# Patient Record
Sex: Female | Born: 1955 | Race: White | Hispanic: No | Smoking: Current some day smoker
Health system: Southern US, Community
[De-identification: ages and names within clinical notes are randomized; demographics above are authoritative.]

## PROBLEM LIST (undated history)

## (undated) DIAGNOSIS — I10 Essential (primary) hypertension: Secondary | ICD-10-CM

## (undated) DIAGNOSIS — E079 Disorder of thyroid, unspecified: Secondary | ICD-10-CM

## (undated) DIAGNOSIS — E039 Hypothyroidism, unspecified: Secondary | ICD-10-CM

## (undated) DIAGNOSIS — J449 Chronic obstructive pulmonary disease, unspecified: Secondary | ICD-10-CM

## (undated) DIAGNOSIS — I739 Peripheral vascular disease, unspecified: Secondary | ICD-10-CM

## (undated) DIAGNOSIS — E785 Hyperlipidemia, unspecified: Secondary | ICD-10-CM

## (undated) DIAGNOSIS — I509 Heart failure, unspecified: Secondary | ICD-10-CM

## (undated) DIAGNOSIS — R569 Unspecified convulsions: Secondary | ICD-10-CM

## (undated) HISTORY — DX: Essential (primary) hypertension: I10

## (undated) HISTORY — DX: Heart failure, unspecified: I50.9

## (undated) HISTORY — DX: Peripheral vascular disease, unspecified: I73.9

## (undated) HISTORY — DX: Unspecified convulsions: R56.9

## (undated) HISTORY — PX: COLONOSCOPY: SHX174

## (undated) HISTORY — PX: TONSILLECTOMY: SUR1361

## (undated) HISTORY — DX: Disorder of thyroid, unspecified: E07.9

## (undated) HISTORY — DX: Hyperlipidemia, unspecified: E78.5

---

## 2001-02-10 ENCOUNTER — Inpatient Hospital Stay (HOSPITAL_COMMUNITY): Admission: AD | Admit: 2001-02-10 | Discharge: 2001-02-10 | Payer: Self-pay | Admitting: Obstetrics

## 2001-02-27 ENCOUNTER — Other Ambulatory Visit: Admission: RE | Admit: 2001-02-27 | Discharge: 2001-02-27 | Payer: Self-pay | Admitting: *Deleted

## 2005-08-12 ENCOUNTER — Other Ambulatory Visit: Admission: RE | Admit: 2005-08-12 | Discharge: 2005-08-12 | Payer: Self-pay | Admitting: Obstetrics and Gynecology

## 2010-11-17 ENCOUNTER — Ambulatory Visit (HOSPITAL_BASED_OUTPATIENT_CLINIC_OR_DEPARTMENT_OTHER)
Admission: RE | Admit: 2010-11-17 | Discharge: 2010-11-17 | Disposition: A | Discharge status: Home / Self Care | Source: Ambulatory Visit | Attending: Urology | Admitting: Urology

## 2010-11-17 DIAGNOSIS — R109 Unspecified abdominal pain: Secondary | ICD-10-CM | POA: Insufficient documentation

## 2010-11-17 DIAGNOSIS — N949 Unspecified condition associated with female genital organs and menstrual cycle: Secondary | ICD-10-CM | POA: Insufficient documentation

## 2010-11-17 DIAGNOSIS — Z01812 Encounter for preprocedural laboratory examination: Secondary | ICD-10-CM | POA: Insufficient documentation

## 2010-11-17 DIAGNOSIS — Z01818 Encounter for other preprocedural examination: Secondary | ICD-10-CM | POA: Insufficient documentation

## 2010-11-17 DIAGNOSIS — Z87440 Personal history of urinary (tract) infections: Secondary | ICD-10-CM | POA: Insufficient documentation

## 2010-11-17 DIAGNOSIS — F17200 Nicotine dependence, unspecified, uncomplicated: Secondary | ICD-10-CM | POA: Insufficient documentation

## 2010-11-17 DIAGNOSIS — IMO0002 Reserved for concepts with insufficient information to code with codable children: Secondary | ICD-10-CM | POA: Insufficient documentation

## 2010-11-17 DIAGNOSIS — Z01810 Encounter for preprocedural cardiovascular examination: Secondary | ICD-10-CM | POA: Insufficient documentation

## 2010-12-14 NOTE — Op Note (Signed)
  Christie Moreno, Christie Moreno            ACCOUNT NO.:  [id number]  MEDICAL RECORD NO.:  [id number]          PATIENT TYPE:  LOCATION:                                 FACILITY:  PHYSICIAN:  Martina Sinner, MD      DATE OF BIRTH:  DATE OF PROCEDURE: DATE OF DISCHARGE:                              OPERATIVE REPORT   PREOPERATIVE DIAGNOSIS:  Pelvic pain.  POSTOPERATIVE DIAGNOSIS:  Pelvic pain.  SURGERY:  Cystoscopy, bladder hydrodistention, bladder installation therapy.  INDICATIONS:  The patient has nonspecific dyspareunia and abdominal pain.  She consented to the above procedure.  PROCEDURE:  She was prepped and draped in usual fashion.  A 21 scope was utilized.  Bladder mucosa and trigone were normal.  There was no stitch, foreign body, or carcinoma.  Bladder capacity under anesthesia was 300 cc.  I emptied the bladder and recystoscoped the patient, and there was no stitch, foreign body, or carcinoma.  There was no finding to keeping with a diagnosis of interstitial cystitis.  I repeated the hydrodistention and held it for a few minutes.  I emptied the bladder, reinspected, and again, there was no finding to keep with a diagnosis of interstitial cystitis.  Clinically, the patient has nonspecific pain.  She will be offered bladder installation treatment to see if this helps her symptoms.  Thus far I am not diagnosing her with interstitial cystitis.  We will review her chart in the future and we will proceed accordingly.          ______________________________ Martina Sinner, MD     SAM/MEDQ  D:  11/17/2010  T:  11/17/2010  Job:  [id number]  Electronically Signed by Alfredo Martinez MD on 12/14/2010 01:15:53 PM

## 2012-06-14 ENCOUNTER — Emergency Department (HOSPITAL_COMMUNITY)

## 2012-06-14 ENCOUNTER — Emergency Department (HOSPITAL_COMMUNITY)
Admission: EM | Admit: 2012-06-14 | Discharge: 2012-06-15 | Disposition: A | Discharge status: Home / Self Care | Attending: Emergency Medicine | Admitting: Emergency Medicine

## 2012-06-14 ENCOUNTER — Encounter (HOSPITAL_COMMUNITY): Payer: Self-pay | Admitting: *Deleted

## 2012-06-14 DIAGNOSIS — F17200 Nicotine dependence, unspecified, uncomplicated: Secondary | ICD-10-CM | POA: Insufficient documentation

## 2012-06-14 DIAGNOSIS — R29818 Other symptoms and signs involving the nervous system: Secondary | ICD-10-CM | POA: Insufficient documentation

## 2012-06-14 DIAGNOSIS — R2990 Unspecified symptoms and signs involving the nervous system: Secondary | ICD-10-CM

## 2012-06-14 LAB — BASIC METABOLIC PANEL
BUN: 17 mg/dL (ref 6–23)
Calcium: 9.1 mg/dL (ref 8.4–10.5)
GFR calc non Af Amer: >90 mL/min (ref >90)
Glucose, Bld: 175 mg/dL — ABNORMAL HIGH (ref 70–99)
Sodium: 139 mEq/L (ref 135–145)

## 2012-06-14 LAB — CBC WITH DIFFERENTIAL/PLATELET
Eosinophils Absolute: 0.2 10*3/uL (ref 0.0–0.7)
Eosinophils Relative: 2 % (ref 0–5)
Lymphs Abs: 1.6 10*3/uL (ref 0.7–4.0)
MCH: 30.8 pg (ref 26.0–34.0)
MCV: 89.3 fL (ref 78.0–100.0)
Platelets: 160 10*3/uL (ref 150–400)
RBC: 4.41 MIL/uL (ref 3.87–5.11)

## 2012-06-14 LAB — URINALYSIS, ROUTINE W REFLEX MICROSCOPIC
Bilirubin Urine: NEGATIVE
Ketones, ur: NEGATIVE mg/dL
Protein, ur: 100 mg/dL — AB
Urobilinogen, UA: 1 mg/dL (ref 0.0–1.0)

## 2012-06-14 NOTE — ED Notes (Signed)
Pt to CT

## 2012-06-14 NOTE — ED Notes (Signed)
Pt states that she has been doing weird things at [employer].  Yesterday at dinner time, zoned out.  Pt was trying to hand in soup and a little man, ROY, was trying to get her attention, her fiance.  Pt states that she is making strange noises and arms flail at nite.  Fiance was concerned when this started happening in the day.  Pt lost site yesterday in left eye, but came back.  Pt's father had meningoma that was cancerous on his brain stem.  Family concerned.  Pt is alert and talking.  Finance states at dinner, patient did zone out and not coherent.  No pain

## 2012-06-14 NOTE — ED Notes (Signed)
Pt returned from CT °

## 2012-06-14 NOTE — ED Provider Notes (Signed)
History     CSN: [id number]  Arrival date & time 06/14/12  1713   First MD Initiated Contact with Patient 06/14/12 1958      No chief complaint on file.   (Consider location/radiation/quality/duration/timing/severity/associated sxs/prior treatment) HPI Comments: Pt w no sig past medical hx presents to ER with multiple atypical neurologic symptoms. Exact onset unknown. Hx obtained from pt and her fiance who report that she has been doing wired things in her sleep for 1 year (sucking lips, bruxism, musical hand dancing), They haven't been concerned bc symptoms only occurred while sleeping and they assumed it was dreaming until yesterday pt began having similar symptoms during the day. Pt does not recall these episodes. 2 episodes yesterday as well as 2 today. They last aproxamately 2 min, no post ictal state. Pt coughs after episode has SOB, then returns to baseline. More detail description of today's episode was describes at pt having brief LOC with g;azed stare, musical dancing of both hands in different directions, pts hand falling into soup and knocking it over then coming back to normal after coughing spell.  In addition pt reports intermittent right eye loss lasting ~5 min. Fam hx positive for father with  brainstem meningioma, No known MS.   The history is provided by the patient.    History reviewed. No pertinent past medical history.  History reviewed. No pertinent past surgical history.  No family history on file.  History  Substance Use Topics  . Smoking status: Current Every Day Smoker  . Smokeless tobacco: Not on file  . Alcohol Use: No     Comment: rare    OB History    Grav Para Term Preterm Abortions TAB SAB Ect Mult Living                  Review of Systems  All other systems reviewed and are negative.    Allergies  Erythromycin; Percocet; and Penicillins  Home Medications   Current Outpatient Rx  Name  Route  Sig  Dispense  Refill  . ESTROGENS  CONJUGATED 1.25 MG PO TABS   Oral   Take 1.25 mg by mouth daily.         Marland Kitchen NAPROXEN SODIUM 220 MG PO TABS   Oral   Take 220 mg by mouth 2 (two) times daily as needed. For headache         . PROGESTERONE MICRONIZED 200 MG PO CAPS   Oral   Take 200 mg by mouth at bedtime.         Marland Kitchen PSEUDOEPHEDRINE-GUAIFENESIN ER 60-600 MG PO TB12   Oral   Take 1 tablet by mouth every 12 (twelve) hours.           BP 154/78  Pulse 81  Temp 99 F (37.2 C) (Oral)  Resp 20  SpO2 99%  Physical Exam  Nursing note and vitals reviewed. Constitutional: She is oriented to person, place, and time. She appears well-developed and well-nourished. No distress.  HENT:  Head: Normocephalic and atraumatic.  Eyes: Conjunctivae normal and EOM are normal. Pupils are equal, round, and reactive to light. No scleral icterus.  Neck: Normal range of motion and full passive range of motion without pain. Neck supple. No JVD present. Carotid bruit is not present. No rigidity. No Brudzinski's sign noted.  Cardiovascular: Normal rate, regular rhythm, normal heart sounds and intact distal pulses.   Pulmonary/Chest: Effort normal and breath sounds normal. No respiratory distress. She has no wheezes. She has  no rales.  Musculoskeletal: Normal range of motion.  Lymphadenopathy:    She has no cervical adenopathy.  Neurological: She is alert and oriented to person, place, and time. She has normal strength. No cranial nerve deficit or sensory deficit. She displays a negative Romberg sign. Coordination and gait normal. GCS eye subscore is 4. GCS verbal subscore is 5. GCS motor subscore is 6.       A&O x3.  Able to follow commands. PERRL, EOMs, no vertical or bidirectional nystagmus. Shoulder shrug, facial muscles, tongue protrusion and swallow intact.  Motor strength 5/5 bilaterally including grip strength, triceps, hamstrings and ankle dorsiflexion.  Normal patellar DTRs.  Light touch intact in all 4 distal limbs.  Intact  finger to nose, shin to heel and rapid alternating movements. No ataxia or dysequilibrium.   Skin: Skin is warm and dry. No rash noted. She is not diaphoretic.  Psychiatric: She has a normal mood and affect. Her behavior is normal.    ED Course  Procedures (including critical care time)  Labs Reviewed  BASIC METABOLIC PANEL - Abnormal; Notable for the following:    Glucose, Bld 175 (*)     All other components within normal limits  URINALYSIS, ROUTINE W REFLEX MICROSCOPIC - Abnormal; Notable for the following:    Hgb urine dipstick MODERATE (*)     Protein, ur 100 (*)     All other components within normal limits  URINE MICROSCOPIC-ADD ON - Abnormal; Notable for the following:    Squamous Epithelial / LPF FEW (*)     All other components within normal limits  CBC WITH DIFFERENTIAL   Ct Head Wo Contrast  06/14/2012  *RADIOLOGY REPORT*  Clinical Data: Loss of consciousness.  Transient visual loss.  CT HEAD WITHOUT CONTRAST  Technique:  Contiguous axial images were obtained from the base of the skull through the vertex without contrast.  Comparison: None.  Findings: Mild subcortical white matter hypoattenuation is present bilaterally.  No acute cortical infarct, hemorrhage, mass lesion is present.  Ventricles are of normal size.  No significant extra- axial fluid collection is present.  The paranasal sinuses and mastoid air cells are clear.  The osseous skull is intact.  IMPRESSION:  1. Mild white matter change. The finding is nonspecific but can be seen in the setting of chronic microvascular ischemia, a demyelinating process such as multiple sclerosis, vasculitis, complicated migraine headaches, or as the sequelae of a prior infectious or inflammatory process. 2.  No acute intracranial abnormality.   Original Report Authenticated By: Marin Roberts, M.D.    Mr Laqueta Jean Wo Contrast  06/15/2012  *RADIOLOGY REPORT*  Clinical Data: Transient vision loss.  Loss of consciousness.  Rule out  MS.  Rule out stroke  MRI HEAD WITHOUT AND WITH CONTRAST  Technique:  Multiplanar, multiecho pulse sequences of the brain and surrounding structures were obtained according to standard protocol without and with intravenous contrast  Contrast: 10mL MULTIHANCE GADOBENATE DIMEGLUMINE 529 MG/ML IV SOLN  Comparison: CT head 06/14/2012  Findings: Ovoid 1 cm hyperintensity in the deep right frontal white matter.  This shows hyperintensity on diffusion and ADC map indicating T2 shine through   and not restricted diffusion.  Small hyperintensities  left parietal subcortical white matter do not show restricted diffusion.  Small hyperintensity in the right internal capsule without restricted diffusion.  Remaining white matter is normal.  Brainstem and cerebellum are intact.  Negative for intracranial hemorrhage or mass lesion.  No edema or midline shift.  Ventricle  size is normal.  Postcontrast imaging was performed and reveals normal enhancement. White matter lesions do not show enhancement.  IMPRESSION: Scattered hyperintensities in the subcortical and deep white matter.  No areas of restricted diffusion or abnormal enhancement. These lesions are nonspecific but are most likely related to chronic microvascular ischemia.  Demyelinating disease could have this appearance however the lesions are not periventricular in location as   would be expected with multiple sclerosis.   Original Report Authenticated By: Janeece Riggers, M.D.    No diagnosis found.  MDM  56 yo F to ER w atypical neurological symptoms. CT w questionable chronic changes, MRI pending neuroradiologist interpretation. Plan w supervising attending Dr. Silverio Lay is to dc if no acute findings for stroke vs consult neuro tonight if MS demyelination seen, Disposition pending . Pt currently asymptomatic in NAD.    Results as above. No acute findings on imaging. Pt to follow up with neurology. Advised not to drive until cleared by neurology. Pt verbalizes understanding.           Jaci Carrel, [place] 06/15/12 0110

## 2012-06-14 NOTE — ED Notes (Signed)
Pt husband reporting that pt. Has had episodes of dazing on and being unresponsive to speech and doing odd things including lip sucking. States these episodes last 1-2 mins. Then she will cough and be back to normal. Pt does not remember these episodes and denies having headache or any other symptoms after these episodes. No complaints at this time. Seizure precautions in place. IV patent.

## 2012-06-15 MED ORDER — GADOBENATE DIMEGLUMINE 529 MG/ML IV SOLN
10.0000 mL | Freq: Once | INTRAVENOUS | Status: AC
  Administered 2012-06-15: 10 mL via INTRAVENOUS

## 2012-06-15 NOTE — ED Provider Notes (Signed)
Medical screening examination/treatment/procedure(s) were performed by non-physician practitioner and as supervising physician I was immediately available for consultation/collaboration.   David H Yao, MD 06/15/12 1457 

## 2013-03-06 ENCOUNTER — Ambulatory Visit: Payer: Self-pay | Admitting: Nurse Practitioner

## 2013-04-19 ENCOUNTER — Encounter: Payer: Self-pay | Admitting: Nurse Practitioner

## 2013-04-19 ENCOUNTER — Ambulatory Visit (INDEPENDENT_AMBULATORY_CARE_PROVIDER_SITE_OTHER): Admitting: Nurse Practitioner

## 2013-04-19 VITALS — BP 112/60 | HR 75 | Ht 62.25 in | Wt 106.0 lb

## 2013-04-19 DIAGNOSIS — R569 Unspecified convulsions: Secondary | ICD-10-CM

## 2013-04-19 DIAGNOSIS — G40909 Epilepsy, unspecified, not intractable, without status epilepticus: Secondary | ICD-10-CM | POA: Insufficient documentation

## 2013-04-19 HISTORY — DX: Unspecified convulsions: R56.9

## 2013-04-19 MED ORDER — TOPIRAMATE 50 MG PO TABS: 150.0000 mg | ORAL_TABLET | Freq: Two times a day (BID) | ORAL | Status: DC

## 2013-04-19 NOTE — Patient Instructions (Addendum)
Patient to continue Topamax 50 mg, 3 tablets twice daily Patient has resumed driving Followup yearly and when necessary

## 2013-04-19 NOTE — Progress Notes (Signed)
[facility]  PATIENT: AKIMA SLAUGH DOB: [birth year]   REASON FOR VISIT: Followup for seizure disorder    HISTORY OF PRESENT ILLNESS: Ms Broeker, 57 year old female returns for followup. Last seen 09/06/12. She has history of seizure disorder last seizure occurring 06/14/2012. 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 She is currently taking Topamax, 150mg  BID. No further sz activity. Patient is back to  driving and resumed her normal activities  She had  eye exam by Dr. Dione Booze which shows macular degeneration. She was told to quit smoking and take some vitamins for her eyes. She has not done this.    HISTORY: November 2013. had a history of chronic headaches, increased frequency over the past couple years, daily basis, vortex area moderate to severe pressure headaches, occasionally of his visual change ,  Over past 6 months, she had frequent confusion spells, it can happen up to 5 episode in a day, she was eating, suddenly had a glaring stare in her eyes, unresponsive, stirring her soup with her fingers, had violent cough, came around in few minutes.  There was also multiple episode during sleep of making sucking noise, unpurposeful hand movement, confused unresponsive lasting few minutes, She denies significant visual change, no lateralized motor or sensory deficit,  She presented to the emergency room June 14 2012 following one episode, MRI of the brain with and without contrast revealed, there was few scattered deep subcortical white matter disease, no contrast enhancement most consistent with small vessel disease.  REVIEW OF SYSTEMS: Full 14 system review of systems performed and notable only for:  Constitutional: Fatigue Cardiovascular: N/A  Ear/Nose/Throat: N/A  Skin: N/A  Eyes: N/A  Respiratory: N/A  Gastroitestinal: N/A  Hematology/Lymphatic: Easy bruising  Endocrine: N/A Musculoskeletal:N/A  Allergy/Immunology: N/A    Neurological: N/A Psychiatric: Disinterest in activities  ALLERGIES: Allergies  Allergen Reactions  . Erythromycin     Sensitivity (GI)  . Percocet [Oxycodone-Acetaminophen]   . Penicillins Itching and Rash    HOME MEDICATIONS: Outpatient Prescriptions Prior to Visit  Medication Sig Dispense Refill  . estrogens, conjugated, (PREMARIN) 1.25 MG tablet Take 1.25 mg by mouth daily.      . naproxen sodium (ANAPROX) 220 MG tablet Take 220 mg by mouth 2 (two) times daily as needed. For headache      . progesterone (PROMETRIUM) 200 MG capsule Take 200 mg by mouth at bedtime.      . pseudoephedrine-guaifenesin (MUCINEX D) 60-600 MG per tablet Take 1 tablet by mouth every 12 (twelve) hours.       No facility-administered medications prior to visit.    PAST MEDICAL HISTORY: Past Medical History  Diagnosis Date  . Seizures     PAST SURGICAL HISTORY: History reviewed. No pertinent past surgical history.  FAMILY HISTORY: Patient's mother is alive with diabetes father is deceased from cancer  SOCIAL HISTORY: History   Social History  . Marital Status: Widowed    Spouse Name: N/A    Number of Children: N/A  . Years of Education: N/A   Occupational History  . Not on file.   Social History Main Topics  . Smoking status: Current Every Day Smoker  . Smokeless tobacco: Never Used  . Alcohol Use: No     Comment: rare  . Drug Use: No  . Sexual Activity: Not on file   Other Topics Concern  . Not on file   Social History Narrative  .  patient lives with her boyfriend she  has a college education She has one child      PHYSICAL EXAM  Filed Vitals:   04/19/13 1049  BP: 112/60  Pulse: 75  Height: 5' 2.25" (1.581 m)  Weight: 106 lb (48.081 kg)   Body mass index is 19.24 kg/(m^2).  Generalized: Well developed, in no acute distress  Head: normocephalic and atraumatic,. Oropharynx benign  Neck: Supple, no carotid bruits  Cardiac: Regular rate rhythm, no murmur      Neurological examination   Mentation: Alert oriented to time, place, history taking. Follows all commands speech and language fluent  Cranial nerve II-XII: Pupils were equal round reactive to light extraocular movements were full, visual field were full on confrontational test. Facial sensation and strength were normal. hearing was intact to finger rubbing bilaterally. Uvula tongue midline. head turning and shoulder shrug and were normal and symmetric.Tongue protrusion into cheek strength was normal. Motor: normal bulk and tone, full strength in the BUE, BLE, fine finger movements normal, no pronator drift. No focal weakness Sensory: normal and symmetric to light touch, pinprick, and  vibration  Coordination: finger-nose-finger, heel-to-shin bilaterally, no dysmetria Reflexes: Brachioradialis 2/2, biceps 2/2, triceps 2/2, patellar 2/2, Achilles 2/2, plantar responses were flexor bilaterally. Gait and Station: Rising up from seated position without assistance, normal stance, moderate stride, good arm swing, smooth turning, able to perform tiptoe, and heel walking without difficulty. Tandem gait is steady  DIAGNOSTIC DATA (LABS, IMAGING, TESTING) - None to review   ASSESSMENT AND PLAN  57 y.o. year old female  has a past medical history of Seizures. here for followup. Last seizure in November 2013. Patient is currently on Topamax 50 mg 3 tablets twice daily.  Patient to continue Topamax 50 mg, 3 tablets twice daily wants 3 month supply Patient has resumed driving Followup yearly and when necessary Pt was late for her appt, 25 min and made aware she would not be seen if that late again   Nilda Riggs, [facility], [facility] APRN  [facility] [address], [address] [place], [place] [address] [phone]

## 2014-01-27 ENCOUNTER — Other Ambulatory Visit: Payer: Self-pay

## 2014-01-27 MED ORDER — TOPIRAMATE 50 MG PO TABS: 150.0000 mg | ORAL_TABLET | Freq: Two times a day (BID) | ORAL | Status: DC

## 2014-04-19 ENCOUNTER — Ambulatory Visit: Admitting: Nurse Practitioner

## 2014-04-19 ENCOUNTER — Ambulatory Visit (INDEPENDENT_AMBULATORY_CARE_PROVIDER_SITE_OTHER): Admitting: Nurse Practitioner

## 2014-04-19 ENCOUNTER — Encounter (INDEPENDENT_AMBULATORY_CARE_PROVIDER_SITE_OTHER): Payer: Self-pay

## 2014-04-19 ENCOUNTER — Encounter: Payer: Self-pay | Admitting: Nurse Practitioner

## 2014-04-19 VITALS — BP 128/66 | HR 65 | Temp 97.6°F | Ht 60.0 in | Wt 105.6 lb

## 2014-04-19 DIAGNOSIS — Z5181 Encounter for therapeutic drug level monitoring: Secondary | ICD-10-CM

## 2014-04-19 DIAGNOSIS — R569 Unspecified convulsions: Secondary | ICD-10-CM

## 2014-04-19 LAB — BASIC METABOLIC PANEL
BUN / CREAT RATIO: 20 (ref 11–26)
BUN: 14 mg/dL (ref 8–27)
CALCIUM: 9 mg/dL (ref 8.7–10.3)
CHLORIDE: 108 mmol/L (ref 96–108)
CO2: 22 mmol/L (ref 18–29)
Creatinine, Ser: 0.7 mg/dL (ref 0.57–1.00)
GFR calc Af Amer: 109 mL/min/{1.73_m2} (ref >59)
GFR calc non Af Amer: 94 mL/min/{1.73_m2} (ref >59)
Glucose: 86 mg/dL (ref 65–99)
POTASSIUM: 4.4 mmol/L (ref 3.5–5.2)
SODIUM: 140 mmol/L (ref 134–144)

## 2014-04-19 MED ORDER — TOPIRAMATE 50 MG PO TABS: 150.0000 mg | ORAL_TABLET | Freq: Two times a day (BID) | ORAL | Status: DC

## 2014-04-19 NOTE — Progress Notes (Signed)
[facility]  PATIENT: Christie Moreno DOB: [birth year]   REASON FOR VISIT:follow up for seizure disorder   HISTORY OF PRESENT ILLNESS:Christie Moreno, 58 year old female returns for followup. Last seen 04/19/13. She has history of seizure disorder last seizure occurring 06/14/2012. 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 She is currently taking Topamax, 150mg  BID. She denies side effects to  the medication No further sz activity. Patient is back to driving and resumed her normal activities She had eye exam by Dr. Katy Fitch which shows macular degeneration. She was told to quit smoking and take some vitamins for her eyes. She continues to smoke. She returns for reevaluation  HISTORY: November 2013. had a history of chronic headaches, increased frequency over the past couple years, daily basis, vortex area moderate to severe pressure headaches, occasionally of his visual change  ,  Over past 6 months, she had frequent confusion spells, it can happen up to 5 episode in a day, she was eating, suddenly had a glaring stare in her eyes, unresponsive, stirring her soup with her fingers, had violent cough, came around in few minutes.  There was also multiple episode during sleep of making sucking noise, unpurposeful hand movement, confused unresponsive lasting few minutes,  She denies significant visual change, no lateralized motor or sensory deficit,  She presented to the emergency room June 14 2012 following one episode, MRI of the brain with and without contrast revealed, there was few scattered deep subcortical white matter disease, no contrast enhancement most consistent with small vessel disease.      REVIEW OF SYSTEMS: Full 14 system review of systems performed and notable only for those listed, all others are neg:  Constitutional: N/A  Cardiovascular: N/A  Ear/Nose/Throat: N/A  Skin: N/A  Eyes: Allergy  Respiratory: N/A  Gastroitestinal:  N/A  Hematology/Lymphatic: N/A  Endocrine: Intolerance to cold  Musculoskeletal:N/A  Allergy/Immunology: N/A  Neurological: Headache Psychiatric: N/A Sleep : NA   ALLERGIES: Allergies  Allergen Reactions  . Azithromycin Nausea And Vomiting  . Erythromycin     Sensitivity (GI)  . Percocet [Oxycodone-Acetaminophen]   . Penicillins Itching and Rash    HOME MEDICATIONS: Outpatient Prescriptions Prior to Visit  Medication Sig Dispense Refill  . estrogens, conjugated, (PREMARIN) 1.25 MG tablet Take 1.25 mg by mouth daily.      . naproxen sodium (ANAPROX) 220 MG tablet Take 220 mg by mouth 2 (two) times daily as needed. For headache      . progesterone (PROMETRIUM) 200 MG capsule Take 200 mg by mouth at bedtime.      . topiramate (TOPAMAX) 50 MG tablet Take 3 tablets (150 mg total) by mouth 2 (two) times daily.  540 tablet  0   No facility-administered medications prior to visit.    PAST MEDICAL HISTORY: Past Medical History  Diagnosis Date  . Seizures     PAST SURGICAL HISTORY: History reviewed. No pertinent past surgical history.  FAMILY HISTORY: No family history on file.  SOCIAL HISTORY: History   Social History  . Marital Status: Widowed    Spouse Name: N/A    Number of Children: N/A  . Years of Education: N/A   Occupational History  . Not on file.   Social History Main Topics  . Smoking status: Current Every Day Smoker  . Smokeless tobacco: Never Used  . Alcohol Use: Yes     Comment: rare  . Drug Use: No  . Sexual Activity: Not on file  Other Topics Concern  . Not on file   Social History Narrative  . No narrative on file     PHYSICAL EXAM  Filed Vitals:   04/19/14 1332  BP: 128/66  Pulse: 65  Temp: 97.6 F (36.4 C)  TempSrc: Oral  Height: 5' (1.524 m)  Weight: 105 lb 9.6 oz (47.9 kg)   Body mass index is 20.62 kg/(m^2). Generalized: Well developed, in no acute distress  Head: normocephalic and atraumatic,. Oropharynx benign    Neurological examination  Mentation: Alert oriented to time, place, history taking. Follows all commands speech and language fluent  Cranial nerve II-XII: Pupils were equal round reactive to light extraocular movements were full, visual field were full on confrontational test. Facial sensation and strength were normal. hearing was intact to finger rubbing bilaterally. Uvula tongue midline. head turning and shoulder shrug and were normal and symmetric.Tongue protrusion into cheek strength was normal.  Motor: normal bulk and tone, full strength in the BUE, BLE, fine finger movements normal, no pronator drift. No focal weakness  Coordination: finger-nose-finger, heel-to-shin bilaterally, no dysmetria  Reflexes: Brachioradialis 2/2, biceps 2/2, triceps 2/2, patellar 2/2, Achilles 2/2, plantar responses were flexor bilaterally.  Gait and Station: Rising up from seated position without assistance, normal stance, moderate stride, good arm swing, smooth turning, able to perform tiptoe, and heel walking without difficulty. Tandem gait is steady   DIAGNOSTIC DATA (LABS, IMAGING, TESTING) - None to review    ASSESSMENT AND PLAN  58 y.o. year old female  has a past medical history of Seizures. here to follow up. She is currently on Topamax 150 twice daily without further seizure activity  Continue Topamax at current dose will refill mail-order BMP today Followup yearly and when necessary Dennie Bible, [facility], [facility], APRN  [facility] [address], [place], [place] [address] [phone]

## 2014-04-19 NOTE — Patient Instructions (Signed)
Continue Topamax at current dose will refill mail-order BMP today Followup yearly and when necessary

## 2014-11-11 ENCOUNTER — Telehealth: Payer: Self-pay | Admitting: Nurse Practitioner

## 2014-11-11 MED ORDER — TOPIRAMATE 50 MG PO TABS: 150.0000 mg | ORAL_TABLET | Freq: Two times a day (BID) | ORAL | Status: DC

## 2014-11-11 NOTE — Telephone Encounter (Signed)
Rx has been sent.  I called the patient back to advise.  She is aware.

## 2014-11-11 NOTE — Telephone Encounter (Signed)
Patient is calling to get refill for topiramate 50 mg.  Patient only needs about 30 pills as she takes 6 tablets per day x 5 days.  Patient has Rx on automatic reorder with [facility] but she fears she ordered too late and will be without. Pharmacy [facility] [place], [place].  Phone [phone]. Please call patient.  Thanks!

## 2015-04-16 ENCOUNTER — Other Ambulatory Visit: Payer: Self-pay | Admitting: Nurse Practitioner

## 2015-04-22 ENCOUNTER — Encounter: Payer: Self-pay | Admitting: Nurse Practitioner

## 2015-04-22 ENCOUNTER — Ambulatory Visit (INDEPENDENT_AMBULATORY_CARE_PROVIDER_SITE_OTHER): Admitting: Nurse Practitioner

## 2015-04-22 VITALS — BP 126/68 | HR 71 | Ht 62.0 in | Wt 107.0 lb

## 2015-04-22 DIAGNOSIS — Z5181 Encounter for therapeutic drug level monitoring: Secondary | ICD-10-CM | POA: Diagnosis not present

## 2015-04-22 DIAGNOSIS — R5601 Complex febrile convulsions: Secondary | ICD-10-CM

## 2015-04-22 DIAGNOSIS — G40209 Localization-related (focal) (partial) symptomatic epilepsy and epileptic syndromes with complex partial seizures, not intractable, without status epilepticus: Secondary | ICD-10-CM

## 2015-04-22 HISTORY — DX: Localization-related (focal) (partial) symptomatic epilepsy and epileptic syndromes with complex partial seizures, not intractable, without status epilepticus: G40.209

## 2015-04-22 MED ORDER — TOPIRAMATE 50 MG PO TABS: ORAL_TABLET | ORAL | Status: DC

## 2015-04-22 NOTE — Patient Instructions (Signed)
Continue Topamax at current dose will refill BMP and Topamax level today F/U yearly

## 2015-04-22 NOTE — Progress Notes (Signed)
[facility]  PATIENT: Christie Moreno DOB: 1956/07/04   REASON FOR VISIT: Seizure disorder generalized/ partial seizure disorder HISTORY FROM: Patient    HISTORY OF PRESENT ILLNESS:Christie Moreno, 59 year old female returns for followup. Last seen 04/19/14. She has history of seizure disorder, 2 episodes of zoning out in the last year when  Stressed.  EEG abnormal with evidence of generalized epileptiform discharges. She is currently taking Topamax, 150mg  BID. She denies side effects to the medication She had eye exam by Dr. Katy Moreno which shows macular degeneration. She returns for reevaluation  HISTORY: November 2013. had a history of chronic headaches, increased frequency over the past couple years, daily basis, vortex area moderate to severe pressure headaches, occasionally of his visual change   Over past 6 months, she had frequent confusion spells, it can happen up to 5 episode in a day, she was eating, suddenly had a glaring stare in her eyes, unresponsive, stirring her soup with her fingers, had violent cough, came around in few minutes.  There was also multiple episode during sleep of making sucking noise, unpurposeful hand movement, confused unresponsive lasting few minutes,  She denies significant visual change, no lateralized motor or sensory deficit,  She presented to the emergency room June 14 2012 following one episode, MRI of the brain with and without contrast revealed, there was few scattered deep subcortical white matter disease, no contrast enhancement most consistent with small vessel disease.    REVIEW OF SYSTEMS: Full 14 system review of systems performed and notable only for those listed, all others are neg:  Constitutional: neg  Cardiovascular: neg Ear/Nose/Throat: neg  Skin: neg Eyes: neg Respiratory: neg Gastroitestinal:frequency of urine Hematology/Lymphatic: easy bruising Endocrine: cold  intolerance Musculoskeletal:neg Allergy/Immunology: neg Neurological: headache Psychiatric: neg Sleep : neg   ALLERGIES: Allergies  Allergen Reactions  . Azithromycin Nausea And Vomiting  . Erythromycin     Sensitivity (GI)  . Percocet [Oxycodone-Acetaminophen]   . Penicillins Itching and Rash    HOME MEDICATIONS: Outpatient Prescriptions Prior to Visit  Medication Sig Dispense Refill  . estrogens, conjugated, (PREMARIN) 1.25 MG tablet Take 1.25 mg by mouth.    . naproxen sodium (ANAPROX) 220 MG tablet Take 220 mg by mouth 2 (two) times daily as needed. For headache    . progesterone (PROMETRIUM) 200 MG capsule Take 200 mg by mouth.    . topiramate (TOPAMAX) 50 MG tablet TAKE 3 TABLETS (150 MG TOTAL) TWO TIMES DAILY 540 tablet 0   No facility-administered medications prior to visit.    PAST MEDICAL HISTORY: Past Medical History  Diagnosis Date  . Seizures     PAST SURGICAL HISTORY: History reviewed. No pertinent past surgical history.  FAMILY HISTORY: Family History  Problem Relation Age of Onset  . Diabetes Mother   . Cancer Mother     breast  . Heart Problems Father     SOCIAL HISTORY: Social History   Social History  . Marital Status: Widowed    Spouse Name: N/A  . Number of Children: N/A  . Years of Education: N/A   Occupational History  . Not on file.   Social History Main Topics  . Smoking status: Current Every Day Smoker -- 1.00 packs/day    Types: Cigarettes  . Smokeless tobacco: Never Used  . Alcohol Use: 0.0 oz/week    0 Standard drinks or equivalent per week     Comment: rare  . Drug Use: No  . Sexual Activity: Not on file  Other Topics Concern  . Not on file   Social History Narrative     PHYSICAL EXAM  Filed Vitals:   04/22/15 1337  BP: 126/68  Pulse: 71  Height: 5\' 2"  (1.575 m)  Weight: 107 lb (48.535 kg)   Body mass index is 19.57 kg/(m^2). Generalized: Well developed, in no acute distress  Head: normocephalic and  atraumatic,. Oropharynx benign  Neurological examination  Mentation: Alert oriented to time, place, history taking. Follows all commands speech and language fluent  Cranial nerve II-XII: Pupils were equal round reactive to light extraocular movements were full, visual field were full on confrontational test. Facial sensation and strength were normal. hearing was intact to finger rubbing bilaterally. Uvula tongue midline. head turning and shoulder shrug and were normal and symmetric.Tongue protrusion into cheek strength was normal.  Motor: normal bulk and tone, full strength in the BUE, BLE, fine finger movements normal, no pronator drift. No focal weakness  Coordination: finger-nose-finger, heel-to-shin bilaterally, no dysmetria  Reflexes: Brachioradialis 2/2, biceps 2/2, triceps 2/2, patellar 2/2, Achilles 2/2, plantar responses were flexor bilaterally.  Gait and Station: Rising up from seated position without assistance, normal stance, moderate stride, good arm swing, smooth turning, able to perform tiptoe, and heel walking without difficulty. Tandem gait is steady   DIAGNOSTIC DATA (LABS, IMAGING, TESTING) -  ASSESSMENT AND PLAN  59 y.o. year old female  has a past medical history of Seizures. here to follow-up. She has had 2 episodes of staring off in the last year, both occurred during stressful situations.  Continue Topamax at current dose will refill BMP and Topamax level today F/U yearly Christie Moreno, [facility], [facility], APRN  [facility] [address], [place], [place] [address] [phone]

## 2015-04-23 LAB — BASIC METABOLIC PANEL
BUN/Creatinine Ratio: 21 (ref 11–26)
BUN: 15 mg/dL (ref 8–27)
CHLORIDE: 105 mmol/L (ref 97–108)
CO2: 16 mmol/L — AB (ref 18–29)
Calcium: 9.2 mg/dL (ref 8.7–10.3)
Creatinine, Ser: 0.7 mg/dL (ref 0.57–1.00)
GFR calc Af Amer: 108 mL/min/{1.73_m2} (ref >59)
GFR calc non Af Amer: 94 mL/min/{1.73_m2} (ref >59)
GLUCOSE: 79 mg/dL (ref 65–99)
POTASSIUM: 3.9 mmol/L (ref 3.5–5.2)
SODIUM: 141 mmol/L (ref 134–144)

## 2015-04-23 LAB — TOPIRAMATE LEVEL: Topiramate Lvl: 13.1 ug/mL (ref 2.0–25.0)

## 2015-04-23 NOTE — Progress Notes (Signed)
I have reviewed and agreed above plan. 

## 2015-04-24 ENCOUNTER — Telehealth: Payer: Self-pay | Admitting: *Deleted

## 2015-04-24 NOTE — Telephone Encounter (Signed)
I called and spoke to pt and relayed that her lab results looked good.  She verbalized understanding.

## 2015-04-24 NOTE — Telephone Encounter (Signed)
-----   Message from Dennie Bible, NP sent at 04/23/2015  4:49 PM EDT ----- Good level of Topamax , BMP WNL please call the patient

## 2016-04-21 ENCOUNTER — Ambulatory Visit: Admitting: Nurse Practitioner

## 2016-04-22 ENCOUNTER — Encounter: Payer: Self-pay | Admitting: Nurse Practitioner

## 2016-04-22 ENCOUNTER — Ambulatory Visit (INDEPENDENT_AMBULATORY_CARE_PROVIDER_SITE_OTHER): Admitting: Nurse Practitioner

## 2016-04-22 VITALS — BP 137/72 | HR 84 | Ht 62.0 in | Wt 105.0 lb

## 2016-04-22 DIAGNOSIS — G40209 Localization-related (focal) (partial) symptomatic epilepsy and epileptic syndromes with complex partial seizures, not intractable, without status epilepticus: Secondary | ICD-10-CM | POA: Diagnosis not present

## 2016-04-22 DIAGNOSIS — R5601 Complex febrile convulsions: Secondary | ICD-10-CM

## 2016-04-22 MED ORDER — TOPIRAMATE 50 MG PO TABS: ORAL_TABLET | ORAL | 3 refills | Status: DC

## 2016-04-22 NOTE — Progress Notes (Signed)
[facility]  PATIENT: MCKENZIE DUHR DOB: January 31, 1956   REASON FOR VISIT: Seizure disorder generalized/ partial seizure disorder HISTORY FROM: Patient    HISTORY OF PRESENT ILLNESS:Ms Novello, 60 year old female returns for yearly followup.  She has history of seizure disorder, occasional  episodes of zoning out in the last year when  Stressed.  EEG abnormal with evidence of generalized epileptiform discharges. She is currently taking Topamax, 150mg  BID. She denies side effects to the medication .She denies any problems with her memory. Her mother died in 08/18/2022 of stroke.  She had eye exam by Dr. Katy Fitch which shows RX needs to be changed.  She had a cyst removed from her cervix last month. She gets little exercise. She returns for reevaluation  HISTORY: November 2013. had a history of chronic headaches, increased frequency over the past couple years, daily basis, vortex area moderate to severe pressure headaches, occasionally of his visual change   Over past 6 months, she had frequent confusion spells, it can happen up to 5 episode in a day, she was eating, suddenly had a glaring stare in her eyes, unresponsive, stirring her soup with her fingers, had violent cough, came around in few minutes.  There was also multiple episode during sleep of making sucking noise, unpurposeful hand movement, confused unresponsive lasting few minutes,  She denies significant visual change, no lateralized motor or sensory deficit,  She presented to the emergency room June 14 2012 following one episode, MRI of the brain with and without contrast revealed, there was few scattered deep subcortical white matter disease, no contrast enhancement most consistent with small vessel disease.    REVIEW OF SYSTEMS: Full 14 system review of systems performed and notable only for those listed, all others are neg:  Constitutional: neg  Cardiovascular: neg Ear/Nose/Throat: neg  Skin:  neg Eyes: neg Respiratory: neg Gastroitestinal: Hematology/Lymphatic:  Endocrine:  Musculoskeletal:neg Allergy/Immunology: neg Neurological: headache Psychiatric: neg Sleep : neg   ALLERGIES: Allergies  Allergen Reactions  . Azithromycin Nausea And Vomiting  . Erythromycin     Sensitivity (GI)  . Percocet [Oxycodone-Acetaminophen]   . Penicillins Itching and Rash    HOME MEDICATIONS: Outpatient Medications Prior to Visit  Medication Sig Dispense Refill  . estrogens, conjugated, (PREMARIN) 1.25 MG tablet Take 1.25 mg by mouth.    . naproxen sodium (ANAPROX) 220 MG tablet Take 220 mg by mouth 2 (two) times daily as needed. For headache    . progesterone (PROMETRIUM) 200 MG capsule Take 200 mg by mouth.    . topiramate (TOPAMAX) 50 MG tablet TAKE 3 TABLETS (150 MG TOTAL) TWO TIMES DAILY 540 tablet 3   No facility-administered medications prior to visit.     PAST MEDICAL HISTORY: Past Medical History:  Diagnosis Date  . Seizures ([place])     PAST SURGICAL HISTORY: History reviewed. No pertinent surgical history.  FAMILY HISTORY: Family History  Problem Relation Age of Onset  . Diabetes Mother   . Cancer Mother     breast  . Heart Problems Father     SOCIAL HISTORY: Social History   Social History  . Marital status: Widowed    Spouse name: N/A  . Number of children: N/A  . Years of education: N/A   Occupational History  . Not on file.   Social History Main Topics  . Smoking status: Current Every Day Smoker    Packs/day: 1.00    Types: Cigarettes  . Smokeless tobacco: Never Used  . Alcohol  use 0.0 oz/week     Comment: rare  . Drug use: No  . Sexual activity: Not on file   Other Topics Concern  . Not on file   Social History Narrative  . No narrative on file     PHYSICAL EXAM  Vitals:   04/22/16 1025  BP: 137/72  Pulse: 84  Weight: 105 lb (47.6 kg)  Height: 5\' 2"  (1.575 m)   Body mass index is 19.2 kg/m. Generalized: Well developed,  in no acute distress  Head: normocephalic and atraumatic,. Oropharynx benign  Neurological examination  Mentation: Alert oriented to time, place, history taking. Follows all commands speech and language fluent  Cranial nerve II-XII: Pupils were equal round reactive to light extraocular movements were full, visual field were full on confrontational test. Facial sensation and strength were normal. hearing was intact to finger rubbing bilaterally. Uvula tongue midline. head turning and shoulder shrug and were normal and symmetric.Tongue protrusion into cheek strength was normal.  Motor: normal bulk and tone, full strength in the BUE, BLE, fine finger movements normal, no pronator drift. No focal weakness  Coordination: finger-nose-finger, heel-to-shin bilaterally, no dysmetria  Reflexes: Brachioradialis 2/2, biceps 2/2, triceps 2/2, patellar 2/2, Achilles 2/2, plantar responses were flexor bilaterally.  Gait and Station: Rising up from seated position without assistance, normal stance, moderate stride, good arm swing, smooth turning, able to perform tiptoe, and heel walking without difficulty. Tandem gait is steady   DIAGNOSTIC DATA (LABS, IMAGING, TESTING) -  ASSESSMENT AND PLAN  60 y.o. year old female  has a past medical history of Seizures ([place]). here to follow-up. She has had  occasional  episodes of staring off in the last year, both occurred during stressful situations.  Continue Topamax at current dose will refill Try to stop smoking risk factor for multiple medical conditions Need to get back to exercising by walking  Follow-up yearly and when necessary Dennie Bible, [facility], [facility], APRN  [facility] [address], [place], [place] [address] [phone]

## 2016-04-22 NOTE — Patient Instructions (Signed)
Continue Topamax at current dose Try to stop smoking Follow-up yearly and when necessary

## 2016-04-28 NOTE — Progress Notes (Signed)
I have reviewed and agreed above plan. 

## 2017-04-20 NOTE — Progress Notes (Signed)
[facility]  PATIENT: Christie Moreno DOB: 12/09/1955   REASON FOR VISIT: Seizure disorder generalized/ partial seizure disorder HISTORY FROM: Patient    HISTORY OF PRESENT ILLNESS:Christie Moreno, 61 year old female returns for yearly followup.  She has history of seizure disorder, occasional  episodes of zoning out in the last year when  Stressed.  EEG abnormal with evidence of generalized epileptiform discharges. She is currently taking Topamax, 150mg  BID. She denies side effects to the medication .She denies any problems with her memory.   She gets little exercise. She continues to smoke.She returns for reevaluation  HISTORY: November 2013. had a history of chronic headaches, increased frequency over the past couple years, daily basis, vortex area moderate to severe pressure headaches, occasionally of his visual change   Over past 6 months, she had frequent confusion spells, it can happen up to 5 episode in a day, she was eating, suddenly had a glaring stare in her eyes, unresponsive, stirring her soup with her fingers, had violent cough, came around in few minutes.  There was also multiple episode during sleep of making sucking noise, unpurposeful hand movement, confused unresponsive lasting few minutes,  She denies significant visual change, no lateralized motor or sensory deficit,  She presented to the emergency room June 14 2012 following one episode, MRI of the brain with and without contrast revealed, there was few scattered deep subcortical white matter disease, no contrast enhancement most consistent with small vessel disease.    REVIEW OF SYSTEMS: Full 14 system review of systems performed and notable only for those listed, all others are neg:  Constitutional: fatigue  Cardiovascular: neg Ear/Nose/Throat: neg  Skin: neg Eyes: neg Respiratory: neg Gastroitestinal: Hematology/Lymphatic:  Endocrine: intolerance to heat and  cold Musculoskeletal:neg Allergy/Immunology: neg Neurological: headache, seizure disorder Psychiatric: neg Sleep : neg   ALLERGIES: Allergies  Allergen Reactions  . Azithromycin Nausea And Vomiting  . Erythromycin     Sensitivity (GI)  . Percocet [Oxycodone-Acetaminophen]   . Penicillins Itching and Rash    HOME MEDICATIONS: Outpatient Medications Prior to Visit  Medication Sig Dispense Refill  . estrogens, conjugated, (PREMARIN) 1.25 MG tablet Take 1.25 mg by mouth.    . naproxen sodium (ANAPROX) 220 MG tablet Take 220 mg by mouth 2 (two) times daily as needed. For headache    . progesterone (PROMETRIUM) 200 MG capsule Take 200 mg by mouth.    . topiramate (TOPAMAX) 50 MG tablet TAKE 3 TABLETS (150 MG TOTAL) TWO TIMES DAILY 540 tablet 3   No facility-administered medications prior to visit.     PAST MEDICAL HISTORY: Past Medical History:  Diagnosis Date  . Seizures ([place])     PAST SURGICAL HISTORY: No past surgical history on file.  FAMILY HISTORY: Family History  Problem Relation Age of Onset  . Diabetes Mother   . Cancer Mother        breast  . Stroke Mother   . Heart Problems Father   . Diabetes Sister     SOCIAL HISTORY: Social History   Social History  . Marital status: Widowed    Spouse name: N/A  . Number of children: N/A  . Years of education: N/A   Occupational History  . Not on file.   Social History Main Topics  . Smoking status: Current Every Day Smoker    Packs/day: 1.00    Types: Cigarettes  . Smokeless tobacco: Never Used  . Alcohol use 0.0 oz/week     Comment: rare  .  Drug use: No  . Sexual activity: Not on file   Other Topics Concern  . Not on file   Social History Narrative  . No narrative on file     PHYSICAL EXAM  Vitals:   04/21/17 1335  BP: (!) 144/80  Pulse: 81  Weight: 102 lb 12.8 oz (46.6 kg)  Height: 5\' 2"  (1.575 m)   Body mass index is 18.8 kg/m. Generalized: Well developed, in no acute distress   Head: normocephalic and atraumatic,. Oropharynx benign  Neurological examination  Mentation: Alert oriented to time, place, history taking. Follows all commands speech and language fluent  Cranial nerve II-XII: Pupils were equal round reactive to light extraocular movements were full, visual field were full on confrontational test. Facial sensation and strength were normal. hearing was intact to finger rubbing bilaterally. Uvula tongue midline. head turning and shoulder shrug and were normal and symmetric.Tongue protrusion into cheek strength was normal.  Motor: normal bulk and tone, full strength in the BUE, BLE, fine finger movements normal, no pronator drift. No focal weakness  Coordination: finger-nose-finger, heel-to-shin bilaterally, no dysmetria  Reflexes: Brachioradialis 2/2, biceps 2/2, triceps 2/2, patellar 2/2, Achilles 2/2, plantar responses were flexor bilaterally.  Gait and Station: Rising up from seated position without assistance, normal stance, moderate stride, good arm swing, smooth turning, able to perform tiptoe, and heel walking without difficulty. Tandem gait is steady   DIAGNOSTIC DATA (LABS, IMAGING, TESTING) -  ASSESSMENT AND PLAN  61 y.o. year old female  has a past medical history of Seizures ([place]). here to follow-up. She has had  occasional  episodes of staring off in the last year, both occurred during stressful situations.  PLAN: Continue Topamax at current dose will refill Try to stop smoking risk factor for multiple medical conditions Need to get back to exercising by walking  Follow-up yearly and when necessary Dennie Bible, [facility], [facility], APRN  [facility] [address] [place], [place] [address] [phone]

## 2017-04-21 ENCOUNTER — Encounter: Payer: Self-pay | Admitting: Nurse Practitioner

## 2017-04-21 ENCOUNTER — Ambulatory Visit (INDEPENDENT_AMBULATORY_CARE_PROVIDER_SITE_OTHER): Admitting: Nurse Practitioner

## 2017-04-21 VITALS — BP 144/80 | HR 81 | Ht 62.0 in | Wt 102.8 lb

## 2017-04-21 DIAGNOSIS — G40209 Localization-related (focal) (partial) symptomatic epilepsy and epileptic syndromes with complex partial seizures, not intractable, without status epilepticus: Secondary | ICD-10-CM | POA: Diagnosis not present

## 2017-04-21 MED ORDER — TOPIRAMATE 50 MG PO TABS: ORAL_TABLET | ORAL | 3 refills | Status: DC

## 2017-04-21 NOTE — Patient Instructions (Signed)
Continue Topamax at current dose will refill Try to stop smoking risk factor for multiple medical conditions Need to get back to exercising by walking  Follow-up yearly and when necessary

## 2017-04-22 ENCOUNTER — Ambulatory Visit: Admitting: Nurse Practitioner

## 2017-04-22 NOTE — Progress Notes (Signed)
I have reviewed and agreed above plan. 

## 2017-10-24 ENCOUNTER — Telehealth: Payer: Self-pay | Admitting: Nurse Practitioner

## 2017-10-24 MED ORDER — TOPIRAMATE 50 MG PO TABS: ORAL_TABLET | ORAL | 0 refills | Status: DC

## 2017-10-24 NOTE — Telephone Encounter (Signed)
E scribed one month's supply of topiramate to [facility], [place] until patient gets refill from [facility].

## 2017-10-24 NOTE — Telephone Encounter (Signed)
Pt called stating she forgot to select auto refill for Topamax through [facility]. Stating that they won't be able to send the medication and pt receive it for another month, pt is wanting to know if we can send a months supply to [place].( Pt took her last dose Sunday morning 3/24)

## 2018-04-20 NOTE — Progress Notes (Signed)
[facility]  PATIENT: Christie Moreno DOB: November 27, 1955   REASON FOR VISIT: Seizure disorder generalized/ partial seizure disorder HISTORY FROM: Patient    HISTORY OF PRESENT ILLNESS:UPDATE 04/21/2018 CM Christie Moreno, 62 year old female returns for yearly followup.  She has history of seizure disorder, denies any seizure activity in the last year .  EEG abnormal with evidence of generalized epileptiform discharges. She is currently taking Topamax, 150mg  BID. She denies side effects to the medication .She denies any problems with her memory.   She gets little exercise. She continues to smoke.Her brother recently died at the first of the month from lung cancer and brain cancer. She returns for reevaluation  HISTORY: November 2013. had a history of chronic headaches, increased frequency over the past couple years, daily basis, vortex area moderate to severe pressure headaches, occasionally of his visual change   Over past 6 months, she had frequent confusion spells, it can happen up to 5 episode in a day, she was eating, suddenly had a glaring stare in her eyes, unresponsive, stirring her soup with her fingers, had violent cough, came around in few minutes.  There was also multiple episode during sleep of making sucking noise, unpurposeful hand movement, confused unresponsive lasting few minutes,  She denies significant visual change, no lateralized motor or sensory deficit,  She presented to the emergency room June 14 2012 following one episode, MRI of the brain with and without contrast revealed, there was few scattered deep subcortical white matter disease, no contrast enhancement most consistent with small vessel disease.    REVIEW OF SYSTEMS: Full 14 system review of systems performed and notable only for those listed, all others are neg:  Constitutional: fatigue  Cardiovascular: neg Ear/Nose/Throat: neg  Skin: neg Eyes: neg Respiratory:  neg Gastroitestinal: Hematology/Lymphatic:  Endocrine: intolerance to heat and cold Musculoskeletal:neg Allergy/Immunology: neg Neurological:  seizure disorder Psychiatric: neg Sleep : neg   ALLERGIES: Allergies  Allergen Reactions  . Azithromycin Nausea And Vomiting  . Erythromycin     Sensitivity (GI)  . Percocet [Oxycodone-Acetaminophen]   . Penicillins Itching and Rash    HOME MEDICATIONS: Outpatient Medications Prior to Visit  Medication Sig Dispense Refill  . estrogens, conjugated, (PREMARIN) 1.25 MG tablet Take 1.25 mg by mouth.    . naproxen sodium (ANAPROX) 220 MG tablet Take 220 mg by mouth 2 (two) times daily as needed. For headache    . progesterone (PROMETRIUM) 200 MG capsule Take 200 mg by mouth.    . topiramate (TOPAMAX) 50 MG tablet TAKE 3 TABLETS (150 MG TOTAL) TWO TIMES DAILY 180 tablet 0   No facility-administered medications prior to visit.     PAST MEDICAL HISTORY: Past Medical History:  Diagnosis Date  . Seizures ([place])     PAST SURGICAL HISTORY: History reviewed. No pertinent surgical history.  FAMILY HISTORY: Family History  Problem Relation Age of Onset  . Diabetes Mother   . Cancer Mother        breast  . Stroke Mother   . Heart Problems Father   . Diabetes Sister     SOCIAL HISTORY: Social History   Socioeconomic History  . Marital status: Widowed    Spouse name: Not on file  . Number of children: Not on file  . Years of education: Not on file  . Highest education level: Not on file  Occupational History  . Not on file  Social Needs  . Financial resource strain: Not on file  . Food  insecurity:    Worry: Not on file    Inability: Not on file  . Transportation needs:    Medical: Not on file    Non-medical: Not on file  Tobacco Use  . Smoking status: Current Every Day Smoker    Packs/day: 1.00    Types: Cigarettes  . Smokeless tobacco: Never Used  Substance and Sexual Activity  . Alcohol use: Yes    Alcohol/week: 0.0  standard drinks    Comment: rare  . Drug use: No  . Sexual activity: Not on file  Lifestyle  . Physical activity:    Days per week: Not on file    Minutes per session: Not on file  . Stress: Not on file  Relationships  . Social connections:    Talks on phone: Not on file    Gets together: Not on file    Attends religious service: Not on file    Active member of club or organization: Not on file    Attends meetings of clubs or organizations: Not on file    Relationship status: Not on file  . Intimate partner violence:    Fear of current or ex partner: Not on file    Emotionally abused: Not on file    Physically abused: Not on file    Forced sexual activity: Not on file  Other Topics Concern  . Not on file  Social History Narrative  . Not on file     PHYSICAL EXAM  Vitals:   04/21/18 0844  BP: (!) 150/71  Pulse: (!) 55  Weight: 99 lb (44.9 kg)  Height: 5\' 2"  (1.575 m)   Body mass index is 18.11 kg/m. Generalized: Well developed, in no acute distress  Head: normocephalic and atraumatic,. Oropharynx benign  Neurological examination  Mentation: Alert oriented to time, place, history taking. Follows all commands speech and language fluent  Cranial nerve II-XII: Pupils were equal round reactive to light extraocular movements were full, visual field were full on confrontational test. Facial sensation and strength were normal. hearing was intact to finger rubbing bilaterally. Uvula tongue midline. head turning and shoulder shrug and were normal and symmetric.Tongue protrusion into cheek strength was normal.  Motor: normal bulk and tone, full strength in the BUE, BLE, fine finger movements normal, no pronator drift. No focal weakness  Coordination: finger-nose-finger, heel-to-shin bilaterally, no dysmetria  Reflexes: Brachioradialis 2/2, biceps 2/2, triceps 2/2, patellar 2/2, Achilles 2/2, plantar responses were flexor bilaterally.  Gait and Station: Rising up from  seated position without assistance, normal stance, moderate stride, good arm swing, smooth turning, able to perform tiptoe, and heel walking without difficulty. Tandem gait is steady   DIAGNOSTIC DATA (LABS, IMAGING, TESTING) -  ASSESSMENT AND PLAN  62 y.o. year old female  has a past medical history of Seizures ([place]). here to follow-up.  Her seizure disorder is well controlled on Topamax    PLAN: Continue Topamax at current dose will refill Try to stop smoking risk factor for multiple medical conditions Need to get back to exercising by walking  Continue healthy diet Follow-up yearly and when necessary Dennie Bible, [facility], [facility], APRN  [facility] [address] [place], [place] [address] [phone]

## 2018-04-21 ENCOUNTER — Encounter: Payer: Self-pay | Admitting: Nurse Practitioner

## 2018-04-21 ENCOUNTER — Ambulatory Visit (INDEPENDENT_AMBULATORY_CARE_PROVIDER_SITE_OTHER): Admitting: Nurse Practitioner

## 2018-04-21 VITALS — BP 150/71 | HR 55 | Ht 62.0 in | Wt 99.0 lb

## 2018-04-21 DIAGNOSIS — G40209 Localization-related (focal) (partial) symptomatic epilepsy and epileptic syndromes with complex partial seizures, not intractable, without status epilepticus: Secondary | ICD-10-CM

## 2018-04-21 MED ORDER — TOPIRAMATE 50 MG PO TABS: ORAL_TABLET | ORAL | 3 refills | Status: DC

## 2018-04-21 NOTE — Progress Notes (Signed)
I have reviewed and agreed above plan. 

## 2018-04-21 NOTE — Patient Instructions (Addendum)
Continue Topamax at current dose will refill Try to stop smoking risk factor for multiple medical conditions Need to get back to exercising by walking  Continue healthy diet  Follow-up yearly and when necessary

## 2018-08-21 ENCOUNTER — Telehealth: Payer: Self-pay | Admitting: Neurology

## 2018-08-21 NOTE — Telephone Encounter (Signed)
Patient just took her last topiramate Saturday in AM and called for refills - couldn't get [facility] to send a refill in time ( mail order ) . I gave 10 days of 50 mg capsules , 3 bid, total daily dose was 150 mg bid.

## 2018-09-28 ENCOUNTER — Other Ambulatory Visit: Payer: Self-pay | Admitting: Obstetrics and Gynecology

## 2018-09-28 DIAGNOSIS — E2839 Other primary ovarian failure: Secondary | ICD-10-CM

## 2018-12-18 ENCOUNTER — Other Ambulatory Visit

## 2019-02-07 ENCOUNTER — Other Ambulatory Visit

## 2019-02-23 ENCOUNTER — Telehealth: Payer: Self-pay | Admitting: Nurse Practitioner

## 2019-02-23 MED ORDER — TOPIRAMATE 50 MG PO TABS: 150.0000 mg | ORAL_TABLET | Freq: Two times a day (BID) | ORAL | 3 refills | Status: DC

## 2019-02-23 NOTE — Telephone Encounter (Signed)
Pt is asking if On Call Dr will fill her topiramate (TOPAMAX) 50 MG tablet until her upcoming appointment which is set for 09-21, if so pt would like it called into [facility] on [address] in [place] @ [phone]

## 2019-02-23 NOTE — Telephone Encounter (Signed)
I called in Topamax 50mg  3 tabs twice 180 tabs with 3 refills.

## 2019-02-23 NOTE — Addendum Note (Signed)
Addended by: Marcial Pacas on: 02/23/2019 01:48 PM   Modules accepted: Orders

## 2019-04-23 ENCOUNTER — Other Ambulatory Visit: Payer: Self-pay

## 2019-04-23 ENCOUNTER — Ambulatory Visit (INDEPENDENT_AMBULATORY_CARE_PROVIDER_SITE_OTHER): Payer: Medicare Other | Admitting: Neurology

## 2019-04-23 ENCOUNTER — Telehealth: Payer: Self-pay | Admitting: Neurology

## 2019-04-23 ENCOUNTER — Encounter: Payer: Self-pay | Admitting: Neurology

## 2019-04-23 VITALS — BP 171/89 | HR 80 | Temp 97.7°F | Ht 62.0 in | Wt 98.0 lb

## 2019-04-23 DIAGNOSIS — G40209 Localization-related (focal) (partial) symptomatic epilepsy and epileptic syndromes with complex partial seizures, not intractable, without status epilepticus: Secondary | ICD-10-CM | POA: Insufficient documentation

## 2019-04-23 MED ORDER — TOPIRAMATE 200 MG PO TABS: 200.0000 mg | ORAL_TABLET | Freq: Two times a day (BID) | ORAL | 4 refills | Status: DC

## 2019-04-23 MED ORDER — TOPIRAMATE 200 MG PO TABS: 200.0000 mg | ORAL_TABLET | Freq: Two times a day (BID) | ORAL | 3 refills | Status: DC

## 2019-04-23 NOTE — Patient Instructions (Addendum)
[facility] Address: [name] [place], [place], [place] [address]   Phone: [phone]

## 2019-04-23 NOTE — Progress Notes (Signed)
[facility]  PATIENT: Christie Moreno DOB: [birth year]  HISTORY OF PRESENT ILLNESS: November 2013. had a history of chronic headaches, increased frequency over the past couple years, daily basis, vortex area moderate to severe pressure headaches, occasionally of his visual change   Over past 6 months, she had frequent confusion spells, it can happen up to 5 episode in a day, she was eating, suddenly had a glaring stare in her eyes, unresponsive, stirring her soup with her fingers, had violent cough, came around in few minutes.  There was also multiple episode during sleep of making sucking noise, unpurposeful hand movement, confused unresponsive lasting few minutes,  She denies significant visual change, no lateralized motor or sensory deficit,  She presented to the emergency room June 14 2012 following one episode, MRI of the brain with and without contrast showed few scattered deep subcortical white matter disease, no contrast enhancement most consistent with small vessel disease.   UPDATE Sept 21 2020: She complains of occasionally recurrent confusion spells, often preceded by strong sense of strange smell, then staring spells, lasting for few seconds, there was no generalized tonic-clonic activity, despite taking Topamax 50 mg 3 tablets twice a day, it happened about couple times each month,  Previous EEG was abnormal, there was evidence of generalized epileptiform discharges.  REVIEW OF SYSTEMS: Full 14 system review of systems performed and notable only for those listed, all others are neg:  As above   ALLERGIES: Allergies  Allergen Reactions   Azithromycin Nausea And Vomiting   Erythromycin     Sensitivity (GI)   Percocet [Oxycodone-Acetaminophen]    Penicillins Itching and Rash    HOME MEDICATIONS: Outpatient Medications Prior to Visit  Medication Sig Dispense Refill   estrogens, conjugated, (PREMARIN) 1.25 MG tablet Take 1.25 mg by  mouth.     naproxen sodium (ANAPROX) 220 MG tablet Take 220 mg by mouth 2 (two) times daily as needed. For headache     progesterone (PROMETRIUM) 200 MG capsule Take 200 mg by mouth.     topiramate (TOPAMAX) 50 MG tablet Take 3 tablets (150 mg total) by mouth 2 (two) times daily. TAKE 3 TABLETS (150 MG TOTAL) TWO TIMES DAILY 180 tablet 3   No facility-administered medications prior to visit.     PAST MEDICAL HISTORY: Past Medical History:  Diagnosis Date   Seizures ([place])     PAST SURGICAL HISTORY: History reviewed. No pertinent surgical history.  FAMILY HISTORY: Family History  Problem Relation Age of Onset   Diabetes Mother    Cancer Mother        breast   Stroke Mother    Heart Problems Father    Diabetes Sister     SOCIAL HISTORY: Social History   Socioeconomic History   Marital status: Widowed    Spouse name: Not on file   Number of children: Not on file   Years of education: Not on file   Highest education level: Not on file  Occupational History   Not on file  Social Needs   Financial resource strain: Not on file   Food insecurity    Worry: Not on file    Inability: Not on file   Transportation needs    Medical: Not on file    Non-medical: Not on file  Tobacco Use   Smoking status: Current Every Day Smoker    Packs/day: 1.00    Types: Cigarettes   Smokeless tobacco: Never Used  Substance and Sexual Activity  Alcohol use: Yes    Alcohol/week: 0.0 standard drinks    Comment: rare   Drug use: No   Sexual activity: Not on file  Lifestyle   Physical activity    Days per week: Not on file    Minutes per session: Not on file   Stress: Not on file  Relationships   Social connections    Talks on phone: Not on file    Gets together: Not on file    Attends religious service: Not on file    Active member of club or organization: Not on file    Attends meetings of clubs or organizations: Not on file    Relationship status: Not  on file   Intimate partner violence    Fear of current or ex partner: Not on file    Emotionally abused: Not on file    Physically abused: Not on file    Forced sexual activity: Not on file  Other Topics Concern   Not on file  Social History Narrative   Not on file     PHYSICAL EXAM  Vitals:   04/23/19 0947  BP: (!) 171/89  Pulse: 80  Temp: 97.7 F (36.5 C)  Weight: 98 lb (44.5 kg)  Height: 5\' 2"  (1.575 m)   Body mass index is 17.92 kg/m. Generalized: Well developed, in no acute distress  Head: normocephalic and atraumatic,. Oropharynx benign  Neurological examination  Mentation: Alert oriented to time, place, history taking. Follows all commands speech and language fluent  Cranial nerve II-XII: Pupils were equal round reactive to light extraocular movements were full, visual field were full on confrontational test. Facial sensation and strength were normal. hearing was intact to finger rubbing bilaterally. Uvula tongue midline. head turning and shoulder shrug and were normal and symmetric.Tongue protrusion into cheek strength was normal.  Motor: normal bulk and tone, full strength in the BUE, BLE, fine finger movements normal, no pronator drift. No focal weakness  Coordination: finger-nose-finger, heel-to-shin bilaterally, no dysmetria  Reflexes: Brachioradialis 2/2, biceps 2/2, triceps 2/2, patellar 2/2, Achilles 2/2, plantar responses were flexor bilaterally.  Gait and Station: Rising up from seated position without assistance, normal stance,   DIAGNOSTIC DATA (LABS, IMAGING, TESTING) -  ASSESSMENT AND PLAN  63 y.o. year old female   Epilepsy  Generalized versus complex partial seizure  Increase Topamax to 200 mg twice a day,  Repeat EEG  Laboratory evaluation including Topamax level   Marcial Pacas, M.D. Ph.D.  [facility] [place], [place] [address] Phone: [phone] Fax:      [phone]

## 2019-04-23 NOTE — Telephone Encounter (Signed)
Pt called stating that she is needing RN to call her to clarify the instructions she is needing to follow to take her topiramate (TOPAMAX) 200 MG tablet Please advise.

## 2019-04-23 NOTE — Telephone Encounter (Signed)
Patient's topiramate was increased today to 200mg , one tablet BID.  She would like a new 90-day prescription sent to [facility].  She is going to use a [email or web address] coupon and this is the least expensive pharmacy.  90-day x 3 refills sent.

## 2019-04-24 ENCOUNTER — Telehealth: Payer: Self-pay | Admitting: Neurology

## 2019-04-24 NOTE — Telephone Encounter (Signed)
I have spoken with the patient and provided her with the lab results below.  She verbalized understanding to contact her PCP for further review of her abnormal TSH.

## 2019-04-24 NOTE — Telephone Encounter (Signed)
Please call patient, laboratory evaluation showed mild elevated TSH, this could indicate a mild hypothyroidism, I have forwarded laboratory to her primary care physician, she should continue follow-up with her PCP, rest of the laboratory evaluation showed no significant abnormalities.

## 2019-04-25 LAB — COMPREHENSIVE METABOLIC PANEL
ALT: 16 IU/L (ref 0–32)
AST: 17 IU/L (ref 0–40)
Albumin/Globulin Ratio: 1.8 (ref 1.2–2.2)
Albumin: 4.2 g/dL (ref 3.8–4.8)
Alkaline Phosphatase: 62 IU/L (ref 39–117)
BUN/Creatinine Ratio: 23 (ref 12–28)
BUN: 19 mg/dL (ref 8–27)
Bilirubin Total: <0.2 mg/dL (ref 0.0–1.2)
CO2: 21 mmol/L (ref 20–29)
Calcium: 9.2 mg/dL (ref 8.7–10.3)
Chloride: 107 mmol/L — ABNORMAL HIGH (ref 96–106)
Creatinine, Ser: 0.81 mg/dL (ref 0.57–1.00)
GFR calc Af Amer: 88 mL/min/{1.73_m2} (ref >59)
GFR calc non Af Amer: 76 mL/min/{1.73_m2} (ref >59)
Globulin, Total: 2.4 g/dL (ref 1.5–4.5)
Glucose: 84 mg/dL (ref 65–99)
Potassium: 4.4 mmol/L (ref 3.5–5.2)
Sodium: 140 mmol/L (ref 134–144)
Total Protein: 6.6 g/dL (ref 6.0–8.5)

## 2019-04-25 LAB — CBC WITH DIFFERENTIAL
Basophils Absolute: 0 10*3/uL (ref 0.0–0.2)
Basos: 1 %
EOS (ABSOLUTE): 0.3 10*3/uL (ref 0.0–0.4)
Eos: 4 %
Hematocrit: 42.9 % (ref 34.0–46.6)
Hemoglobin: 13.9 g/dL (ref 11.1–15.9)
Immature Grans (Abs): 0 10*3/uL (ref 0.0–0.1)
Immature Granulocytes: 0 %
Lymphocytes Absolute: 1.4 10*3/uL (ref 0.7–3.1)
Lymphs: 18 %
MCH: 29.4 pg (ref 26.6–33.0)
MCHC: 32.4 g/dL (ref 31.5–35.7)
MCV: 91 fL (ref 79–97)
Monocytes Absolute: 0.5 10*3/uL (ref 0.1–0.9)
Monocytes: 7 %
Neutrophils Absolute: 5.4 10*3/uL (ref 1.4–7.0)
Neutrophils: 70 %
RBC: 4.73 x10E6/uL (ref 3.77–5.28)
RDW: 13.2 % (ref 11.7–15.4)
WBC: 7.6 10*3/uL (ref 3.4–10.8)

## 2019-04-25 LAB — TOPIRAMATE LEVEL: Topiramate Lvl: 8.8 ug/mL (ref 2.0–25.0)

## 2019-04-25 LAB — TSH: TSH: 4.89 u[IU]/mL — ABNORMAL HIGH (ref 0.450–4.500)

## 2019-04-25 NOTE — Telephone Encounter (Signed)
I returned the call to the patient.  She needed the contact number for her PCP.  She verified she is established with Dr. Deeann Saint.  She was provided with the office number in [place] [phone]).

## 2019-04-25 NOTE — Telephone Encounter (Signed)
Patient calling because she is trying to track down where we sent lab results to Dr Gwenlyn Perking, her PCP. She is trying to locate where is is but is not able to find him  Please call her at [phone] to advise

## 2019-05-14 ENCOUNTER — Other Ambulatory Visit: Payer: Self-pay

## 2019-05-14 ENCOUNTER — Ambulatory Visit (INDEPENDENT_AMBULATORY_CARE_PROVIDER_SITE_OTHER): Payer: Medicare Other | Admitting: Neurology

## 2019-05-14 DIAGNOSIS — G40209 Localization-related (focal) (partial) symptomatic epilepsy and epileptic syndromes with complex partial seizures, not intractable, without status epilepticus: Secondary | ICD-10-CM | POA: Diagnosis not present

## 2019-05-17 NOTE — Procedures (Signed)
   HISTORY: 63 year old female, with history of chronic headache, confusion spells  TECHNIQUE:  This is a routine 16 channel EEG recording with one channel devoted to a limited EKG recording.  It was performed during wakefulness, drowsiness and asleep.  Hyperventilation and photic stimulation were performed as activating procedures.  There are minimum muscle and movement artifact noted.  Upon maximum arousal, posterior dominant waking rhythm consistent of rhythmic alpha range activity, with frequency of 9 hz. Activities are symmetric over the bilateral posterior derivations and attenuated with eye opening.  Hyperventilation produced mild/moderate buildup with higher amplitude and the slower activities noted.  Photic stimulation did not alter the tracing.  During EEG recording, patient developed drowsiness and no deeper stage of sleep was achieved During EEG recording, there was no epileptiform discharge noted.  EKG demonstrate sinus rhythm, with heart rate of 60 bpm  CONCLUSION: This is a  normal awake EEG.  There is no electrodiagnostic evidence of epileptiform discharge.  Marcial Pacas, M.D. Ph.D.  [facility] [place], [place] [address] Phone: [phone] Fax:      [phone]

## 2019-07-02 NOTE — Progress Notes (Signed)
PATIENT: Christie Moreno DOB: May 22, 1956  REASON FOR VISIT: follow up HISTORY FROM: patient  HISTORY OF PRESENT ILLNESS: Today 07/03/19  HISTORY  HISTORY OF PRESENT ILLNESS: November 2013. had a history of chronic headaches, increased frequency over the past couple years, daily basis, vortex area moderate to severe pressure headaches, occasionally of his visual change   Over past 6 months, she had frequent confusion spells, it can happen up to 5 episode in a day, she was eating, suddenly had a glaring stare in her eyes, unresponsive, stirring her soup with her fingers, had violent cough, came around in few minutes.  There was also multiple episode during sleep of making sucking noise, unpurposeful hand movement, confused unresponsive lasting few minutes,  She denies significant visual change, no lateralized motor or sensory deficit,  She presented to the emergency room June 14 2012 following one episode, MRI of the brain with and without contrast showed few scattered deep subcortical white matter disease, no contrast enhancement most consistent with small vessel disease.   UPDATE Sept 21 2020: She complains of occasionally recurrent confusion spells, often preceded by strong sense of strange smell, then staring spells, lasting for few seconds, there was no generalized tonic-clonic activity, despite taking Topamax 50 mg 3 tablets twice a day, it happened about couple times each month,  Previous EEG was abnormal, there was evidence of generalized epileptiform discharges.  Update July 03, 2019 SS: After last visit her Topamax was increased to 200 mg twice a day, laboratory evaluation indicated mild elevated TSH, EEG 05/14/2019 was normal, Topamax level was therapeutic.  With the increase in Topamax, she denies further episodes of strong smells.  She reports she has continued to have a few staring episodes, feeling she cannot focus, she does not lose consciousness, she is  aware the spells are happening.  She indicates the spells have been occurring on and off for years.  She has not had seizures where she loses consciousness since 2013.  She has not had her thyroid evaluated yet.  She continues to smoke. She thinks the higher dose of Topamax makes her feel fatigued.   REVIEW OF SYSTEMS: Out of a complete 14 system review of symptoms, the patient complains only of the following symptoms, and all other reviewed systems are negative.  Seizures   ALLERGIES: Allergies  Allergen Reactions  . Azithromycin Nausea And Vomiting  . Erythromycin     Sensitivity (GI)  . Percocet [Oxycodone-Acetaminophen]   . Penicillins Itching and Rash    HOME MEDICATIONS: Outpatient Medications Prior to Visit  Medication Sig Dispense Refill  . estrogens, conjugated, (PREMARIN) 1.25 MG tablet Take 1.25 mg by mouth.    . naproxen sodium (ANAPROX) 220 MG tablet Take 220 mg by mouth 2 (two) times daily as needed. For headache    . progesterone (PROMETRIUM) 200 MG capsule Take 200 mg by mouth.    . topiramate (TOPAMAX) 200 MG tablet Take 1 tablet (200 mg total) by mouth 2 (two) times daily. 180 tablet 3   No facility-administered medications prior to visit.     PAST MEDICAL HISTORY: Past Medical History:  Diagnosis Date  . Seizures ([place])     PAST SURGICAL HISTORY: History reviewed. No pertinent surgical history.  FAMILY HISTORY: Family History  Problem Relation Age of Onset  . Diabetes Mother   . Cancer Mother        breast  . Stroke Mother   . Heart Problems Father   . Diabetes Sister  SOCIAL HISTORY: Social History   Socioeconomic History  . Marital status: Widowed    Spouse name: Not on file  . Number of children: Not on file  . Years of education: Not on file  . Highest education level: Not on file  Occupational History  . Not on file  Social Needs  . Financial resource strain: Not on file  . Food insecurity    Worry: Not on file    Inability: Not  on file  . Transportation needs    Medical: Not on file    Non-medical: Not on file  Tobacco Use  . Smoking status: Current Every Day Smoker    Packs/day: 1.00    Types: Cigarettes  . Smokeless tobacco: Never Used  Substance and Sexual Activity  . Alcohol use: Yes    Alcohol/week: 0.0 standard drinks    Comment: rare  . Drug use: No  . Sexual activity: Not on file  Lifestyle  . Physical activity    Days per week: Not on file    Minutes per session: Not on file  . Stress: Not on file  Relationships  . Social Herbalist on phone: Not on file    Gets together: Not on file    Attends religious service: Not on file    Active member of club or organization: Not on file    Attends meetings of clubs or organizations: Not on file    Relationship status: Not on file  . Intimate partner violence    Fear of current or ex partner: Not on file    Emotionally abused: Not on file    Physically abused: Not on file    Forced sexual activity: Not on file  Other Topics Concern  . Not on file  Social History Narrative  . Not on file      PHYSICAL EXAM  Vitals:   07/03/19 1342  BP: (!) 163/78  Pulse: 69  Temp: (!) 97.2 F (36.2 C)  TempSrc: Oral  Weight: 94 lb 6.4 oz (42.8 kg)  Height: 5\' 2"  (1.575 m)   Body mass index is 17.27 kg/m.  Generalized: Well developed, in no acute distress   Neurological examination  Mentation: Alert oriented to time, place, history taking. Follows all commands speech and language fluent Cranial nerve II-XII: Pupils were equal round reactive to light. Extraocular movements were full, visual field were full on confrontational test. Facial sensation and strength were normal. Head turning and shoulder shrug were normal and symmetric. Motor: The motor testing reveals 5 over 5 strength of all 4 extremities. Good symmetric motor tone is noted throughout.  Sensory: Sensory testing is intact to soft touch on all 4 extremities. No evidence of  extinction is noted.  Coordination: Cerebellar testing reveals good finger-nose-finger and heel-to-shin bilaterally.  Gait and station: Gait is normal. Tandem gait is normal. Romberg is negative. No drift is seen.  Reflexes: Deep tendon reflexes are symmetric and normal bilaterally.   DIAGNOSTIC DATA (LABS, IMAGING, TESTING) - I reviewed patient records, labs, notes, testing and imaging myself where available.  Lab Results  Component Value Date   WBC 7.6 04/23/2019   HGB 13.9 04/23/2019   HCT 42.9 04/23/2019   MCV 91 04/23/2019   PLT 160 06/14/2012      Component Value Date/Time   NA 140 04/23/2019 1021   K 4.4 04/23/2019 1021   CL 107 (H) 04/23/2019 1021   CO2 21 04/23/2019 1021   GLUCOSE 84 04/23/2019  1021   GLUCOSE 175 (H) 06/14/2012 1728   BUN 19 04/23/2019 1021   CREATININE 0.81 04/23/2019 1021   CALCIUM 9.2 04/23/2019 1021   PROT 6.6 04/23/2019 1021   ALBUMIN 4.2 04/23/2019 1021   AST 17 04/23/2019 1021   ALT 16 04/23/2019 1021   ALKPHOS 62 04/23/2019 1021   BILITOT <0.2 04/23/2019 1021   GFRNONAA 76 04/23/2019 1021   GFRAA 88 04/23/2019 1021   No results found for: CHOL, HDL, LDLCALC, LDLDIRECT, TRIG, CHOLHDL No results found for: HGBA1C No results found for: VITAMINB12 Lab Results  Component Value Date   TSH 4.890 (H) 04/23/2019    ASSESSMENT AND PLAN 63 y.o. year old female  has a past medical history of Seizures ([place]). here with:  1.  Epilepsy -Generalized versus complex partial seizure -Has not had recurrent spell of strong smells, but has had few episodes of staring during stressful time, feels she can't focus, but this historically has been her baseline -Continue Topamax 200 mg twice a day -Recent EEG was normal, previous EEG was abnormal, evidence of generalized epileptiform discharge -She is very hesitant to add on another medication, we will hold off on another seizure medication, I have asked her to keep a journal and track her episodes, she is to  let me know if the spells of strong smells recur -Topamax level was therapeutic in September 2020 -Follow-up in 6 months or sooner if needed  I spent 15 minutes with the patient. 50% of this time was spent discussing her plan of care.    Butler Denmark, AGNP-C, DNP 07/03/2019, 1:56 PM [facility] [address], [place], [place] [address] [phone]

## 2019-07-03 ENCOUNTER — Encounter: Payer: Self-pay | Admitting: Neurology

## 2019-07-03 ENCOUNTER — Other Ambulatory Visit: Payer: Self-pay

## 2019-07-03 ENCOUNTER — Ambulatory Visit (INDEPENDENT_AMBULATORY_CARE_PROVIDER_SITE_OTHER): Payer: Medicare Other | Admitting: Neurology

## 2019-07-03 ENCOUNTER — Ambulatory Visit: Payer: Self-pay | Admitting: Neurology

## 2019-07-03 VITALS — BP 163/78 | HR 69 | Temp 97.2°F | Ht 62.0 in | Wt 94.4 lb

## 2019-07-03 DIAGNOSIS — G40209 Localization-related (focal) (partial) symptomatic epilepsy and epileptic syndromes with complex partial seizures, not intractable, without status epilepticus: Secondary | ICD-10-CM

## 2019-07-03 NOTE — Patient Instructions (Signed)
Continue Topamax 200 mg twice a day  Keep track of episodes, if the strong smells continue before staring off, let me know   Return in 6 months

## 2019-10-03 NOTE — Progress Notes (Signed)
I have reviewed and agreed above plan. 

## 2019-10-09 ENCOUNTER — Other Ambulatory Visit: Payer: Self-pay | Admitting: Obstetrics and Gynecology

## 2019-10-09 DIAGNOSIS — R928 Other abnormal and inconclusive findings on diagnostic imaging of breast: Secondary | ICD-10-CM

## 2019-10-23 ENCOUNTER — Ambulatory Visit
Admission: RE | Admit: 2019-10-23 | Discharge: 2019-10-23 | Disposition: A | Discharge status: Home / Self Care | Payer: Medicare Other | Source: Ambulatory Visit | Attending: Obstetrics and Gynecology | Admitting: Obstetrics and Gynecology

## 2019-10-23 ENCOUNTER — Other Ambulatory Visit: Payer: Self-pay | Admitting: Obstetrics and Gynecology

## 2019-10-23 ENCOUNTER — Other Ambulatory Visit: Payer: Self-pay

## 2019-10-23 DIAGNOSIS — R928 Other abnormal and inconclusive findings on diagnostic imaging of breast: Secondary | ICD-10-CM

## 2020-01-02 ENCOUNTER — Ambulatory Visit: Payer: Medicare Other | Admitting: Neurology

## 2020-01-31 ENCOUNTER — Ambulatory Visit (INDEPENDENT_AMBULATORY_CARE_PROVIDER_SITE_OTHER): Payer: Medicare Other | Admitting: Neurology

## 2020-01-31 ENCOUNTER — Encounter: Payer: Self-pay | Admitting: Neurology

## 2020-01-31 VITALS — BP 155/79 | HR 76 | Ht 62.0 in | Wt 94.8 lb

## 2020-01-31 DIAGNOSIS — G40209 Localization-related (focal) (partial) symptomatic epilepsy and epileptic syndromes with complex partial seizures, not intractable, without status epilepticus: Secondary | ICD-10-CM

## 2020-01-31 MED ORDER — TOPIRAMATE 200 MG PO TABS: 200.0000 mg | ORAL_TABLET | Freq: Two times a day (BID) | ORAL | 3 refills | Status: DC

## 2020-01-31 NOTE — Patient Instructions (Signed)
Continue Topamax 200 mg twice daily  Please see your primary doctor for evaluation of thyroid dysfunction  See you back in 1 year or sooner, call for seizures

## 2020-01-31 NOTE — Progress Notes (Signed)
PATIENT: Christie Moreno DOB: [birth year]  REASON FOR VISIT: follow up HISTORY FROM: patient  HISTORY OF PRESENT ILLNESS: Today 01/31/20  HISTORY November 2013. had a history of chronic headaches, increased frequency over the past couple years, daily basis, vortex area moderate to severe pressure headaches, occasionally of his visual change  Over past 6 months, she had frequent confusion spells, it can happen up to 5 episode in a day, she was eating, suddenly had a glaring stare in her eyes, unresponsive, stirring her soup with her fingers, had violent cough, came around in few minutes.  There was also multiple episode during sleep of making sucking noise, unpurposeful hand movement, confused unresponsive lasting few minutes,  She denies significant visual change, no lateralized motor or sensory deficit,  She presented to the emergency room June 14 2012 following one episode, MRI of the brain with and without contrastshowedfew scattered deep subcortical white matter disease, no contrast enhancement most consistent with small vessel disease.   UPDATE Sept 21 2020: She complains of occasionally recurrent confusion spells, often preceded by strong sense of strange smell, then staring spells, lasting for few seconds, there was no generalized tonic-clonic activity, despite taking Topamax 50 mg 3 tablets twice a day, it happened about couple times each month,  Previous EEG was abnormal, there was evidence of generalized epileptiform discharges.  Update July 03, 2019 SS: After last visit her Topamax was increased to 200 mg twice a day, laboratory evaluation indicated mild elevated TSH, EEG 05/14/2019 was normal, Topamax level was therapeutic.  With the increase in Topamax, she denies further episodes of strong smells.  She reports she has continued to have a few staring episodes, feeling she cannot focus, she does not lose consciousness, she is aware the spells are  happening.  She indicates the spells have been occurring on and off for years.  She has not had seizures where she loses consciousness since 2013.  She has not had her thyroid evaluated yet.  She continues to smoke. She thinks the higher dose of Topamax makes her feel fatigued.    Update January 31, 2020 SS: Remains on Topamax 200 mg twice a day, no further seizures.  Has had a lot of personal stress going on.  Is glad she is received both Covid vaccines.  She does report difficulty with her focus and fatigue.  She does not have much motivation.  Has not followed up with PCP.  Presents today unaccompanied.  REVIEW OF SYSTEMS: Out of a complete 14 system review of symptoms, the patient complains only of the following symptoms, and all other reviewed systems are negative.  Seizures   ALLERGIES: Allergies  Allergen Reactions  . Azithromycin Nausea And Vomiting  . Erythromycin     Sensitivity (GI)  . Percocet [Oxycodone-Acetaminophen]   . Penicillins Itching and Rash    HOME MEDICATIONS: Outpatient Medications Prior to Visit  Medication Sig Dispense Refill  . estrogens, conjugated, (PREMARIN) 1.25 MG tablet Take 1.25 mg by mouth.    . naproxen sodium (ANAPROX) 220 MG tablet Take 220 mg by mouth 2 (two) times daily as needed. For headache    . progesterone (PROMETRIUM) 200 MG capsule Take 200 mg by mouth.    . topiramate (TOPAMAX) 200 MG tablet Take 1 tablet (200 mg total) by mouth 2 (two) times daily. 180 tablet 3   No facility-administered medications prior to visit.    PAST MEDICAL HISTORY: Past Medical History:  Diagnosis Date  . Seizures ([place])  PAST SURGICAL HISTORY: No past surgical history on file.  FAMILY HISTORY: Family History  Problem Relation Age of Onset  . Diabetes Mother   . Cancer Mother        breast  . Stroke Mother   . Heart Problems Father   . Diabetes Sister     SOCIAL HISTORY: Social History   Socioeconomic History  . Marital status: Widowed     Spouse name: Not on file  . Number of children: Not on file  . Years of education: Not on file  . Highest education level: Not on file  Occupational History  . Not on file  Tobacco Use  . Smoking status: Current Every Day Smoker    Packs/day: 1.00    Types: Cigarettes  . Smokeless tobacco: Never Used  Substance and Sexual Activity  . Alcohol use: Yes    Alcohol/week: 0.0 standard drinks    Comment: rare  . Drug use: No  . Sexual activity: Not on file  Other Topics Concern  . Not on file  Social History Narrative  . Not on file   Social Determinants of Health   Financial Resource Strain:   . Difficulty of Paying Living Expenses:   Food Insecurity:   . Worried About Charity fundraiser in the Last Year:   . Arboriculturist in the Last Year:   Transportation Needs:   . Film/video editor (Medical):   Marland Kitchen Lack of Transportation (Non-Medical):   Physical Activity:   . Days of Exercise per Week:   . Minutes of Exercise per Session:   Stress:   . Feeling of Stress :   Social Connections:   . Frequency of Communication with Friends and Family:   . Frequency of Social Gatherings with Friends and Family:   . Attends Religious Services:   . Active Member of Clubs or Organizations:   . Attends Archivist Meetings:   Marland Kitchen Marital Status:   Intimate Partner Violence:   . Fear of Current or Ex-Partner:   . Emotionally Abused:   Marland Kitchen Physically Abused:   . Sexually Abused:    PHYSICAL EXAM  Vitals:   01/31/20 1347  BP: (!) 155/79  Pulse: 76  Weight: 94 lb 12.8 oz (43 kg)  Height: 5\' 2"  (1.575 m)   Body mass index is 17.34 kg/m.  Generalized: Well developed, in no acute distress   Neurological examination  Mentation: Alert oriented to time, place, history taking. Follows all commands speech and language fluent Cranial nerve II-XII: Pupils were equal round reactive to light. Extraocular movements were full, visual field were full on confrontational test. Facial  sensation and strength were normal. Head turning and shoulder shrug  were normal and symmetric. Motor: The motor testing reveals 5 over 5 strength of all 4 extremities. Good symmetric motor tone is noted throughout.  Sensory: Sensory testing is intact to soft touch on all 4 extremities. No evidence of extinction is noted.  Coordination: Cerebellar testing reveals good finger-nose-finger and heel-to-shin bilaterally.  Gait and station: Gait is normal.  Reflexes: Deep tendon reflexes are symmetric and normal bilaterally.   DIAGNOSTIC DATA (LABS, IMAGING, TESTING) - I reviewed patient records, labs, notes, testing and imaging myself where available.  Lab Results  Component Value Date   WBC 7.6 04/23/2019   HGB 13.9 04/23/2019   HCT 42.9 04/23/2019   MCV 91 04/23/2019   PLT 160 06/14/2012      Component Value Date/Time   NA  140 04/23/2019 1021   K 4.4 04/23/2019 1021   CL 107 (H) 04/23/2019 1021   CO2 21 04/23/2019 1021   GLUCOSE 84 04/23/2019 1021   GLUCOSE 175 (H) 06/14/2012 1728   BUN 19 04/23/2019 1021   CREATININE 0.81 04/23/2019 1021   CALCIUM 9.2 04/23/2019 1021   PROT 6.6 04/23/2019 1021   ALBUMIN 4.2 04/23/2019 1021   AST 17 04/23/2019 1021   ALT 16 04/23/2019 1021   ALKPHOS 62 04/23/2019 1021   BILITOT <0.2 04/23/2019 1021   GFRNONAA 76 04/23/2019 1021   GFRAA 88 04/23/2019 1021   No results found for: CHOL, HDL, LDLCALC, LDLDIRECT, TRIG, CHOLHDL No results found for: HGBA1C No results found for: VITAMINB12 Lab Results  Component Value Date   TSH 4.890 (H) 04/23/2019    ASSESSMENT AND PLAN 64 y.o. year old female  has a past medical history of Seizures ([place]). here with:  1.  Epilepsy -Generalized versus complex partial seizure -Recent EEG was normal, previous EEG was abnormal, evidence of generalized epileptiform discharge -Continue Topamax 200 mg twice daily, no further seizure events -Follow-up with PCP, regarding previous abnormal TSH, will be going to  [facility] new provider, if needs a referral, I will do this for her -Follow-up in 1 year or sooner if needed, call for seizure activity  I spent 20 minutes of face-to-face and non-face-to-face time with patient.  This included previsit chart review, lab review, study review, order entry, electronic health record documentation, patient education.  Butler Denmark, AGNP-C, DNP 01/31/2020, 2:19 PM [facility] [address] [place], [place] [address] [phone]

## 2020-04-29 ENCOUNTER — Other Ambulatory Visit: Payer: Self-pay

## 2020-04-29 ENCOUNTER — Other Ambulatory Visit: Payer: Self-pay | Admitting: Obstetrics and Gynecology

## 2020-04-29 ENCOUNTER — Ambulatory Visit
Admission: RE | Admit: 2020-04-29 | Discharge: 2020-04-29 | Disposition: A | Discharge status: Home / Self Care | Payer: Medicare Other | Source: Ambulatory Visit | Attending: Obstetrics and Gynecology | Admitting: Obstetrics and Gynecology

## 2020-04-29 DIAGNOSIS — N6489 Other specified disorders of breast: Secondary | ICD-10-CM

## 2020-04-29 DIAGNOSIS — R928 Other abnormal and inconclusive findings on diagnostic imaging of breast: Secondary | ICD-10-CM

## 2020-10-27 ENCOUNTER — Ambulatory Visit
Admission: RE | Admit: 2020-10-27 | Discharge: 2020-10-27 | Disposition: A | Discharge status: Home / Self Care | Payer: Medicare Other | Source: Ambulatory Visit | Attending: Obstetrics and Gynecology | Admitting: Obstetrics and Gynecology

## 2020-10-27 ENCOUNTER — Ambulatory Visit: Payer: Medicare Other

## 2020-10-27 ENCOUNTER — Other Ambulatory Visit: Payer: Self-pay

## 2020-10-27 DIAGNOSIS — N6489 Other specified disorders of breast: Secondary | ICD-10-CM

## 2021-02-03 ENCOUNTER — Ambulatory Visit (INDEPENDENT_AMBULATORY_CARE_PROVIDER_SITE_OTHER): Payer: Medicare Other | Admitting: Neurology

## 2021-02-03 ENCOUNTER — Other Ambulatory Visit: Payer: Self-pay

## 2021-02-03 ENCOUNTER — Encounter: Payer: Self-pay | Admitting: Neurology

## 2021-02-03 VITALS — BP 187/96 | HR 86 | Ht 62.0 in | Wt 89.0 lb

## 2021-02-03 DIAGNOSIS — G40209 Localization-related (focal) (partial) symptomatic epilepsy and epileptic syndromes with complex partial seizures, not intractable, without status epilepticus: Secondary | ICD-10-CM | POA: Diagnosis not present

## 2021-02-03 MED ORDER — TOPIRAMATE 100 MG PO TABS: ORAL_TABLET | ORAL | 3 refills | Status: DC

## 2021-02-03 NOTE — Patient Instructions (Addendum)
Cut back Topamax 150 mg AM, keep 200 mg at bedtime due to weight loss See primary care about weight loss, this is important and should be followed  See you back in 6 months, call for any spells

## 2021-02-03 NOTE — Progress Notes (Signed)
PATIENT: Christie Moreno DOB: 2008-08-03  REASON FOR VISIT: follow up HISTORY FROM: patient Primary Neurologist: Dr. Krista Blue   HISTORY November 2013. had a history of chronic headaches, increased frequency over the past couple years, daily basis, vortex area moderate to severe pressure headaches, occasionally of his visual change     Over past 67 months, she had frequent confusion spells, it can happen up to 5 episode in a day, she was eating, suddenly had a glaring stare in her eyes, unresponsive, stirring her soup with her fingers, had violent cough, came around in few minutes.   There was also multiple episode during sleep of making sucking noise, unpurposeful hand movement, confused unresponsive lasting few minutes,   She denies significant visual change, no lateralized motor or sensory deficit,   She presented to the emergency room June 14 2012 following one episode, MRI of the brain with and without contrast showed few scattered deep subcortical white matter disease, no contrast enhancement most consistent with small vessel disease.     UPDATE Sept 21 2020: She complains of occasionally recurrent confusion spells, often preceded by strong sense of strange smell, then staring spells, lasting for few seconds, there was no generalized tonic-clonic activity, despite taking Topamax 50 mg 3 tablets twice a day, it happened about couple times each month,   Previous EEG was abnormal, there was evidence of generalized epileptiform discharges.   Update July 02, 2032 SS: After last visit her Topamax was increased to 200 mg twice a day, laboratory evaluation indicated mild elevated TSH, EEG 05/14/2019 was normal, Topamax level was therapeutic.   With the increase in Topamax, she denies further episodes of strong smells.  She reports she has continued to have a few staring episodes, feeling she cannot focus, she does not lose consciousness, she is aware the spells are happening.  She  indicates the spells have been occurring on and off for years.  She has not had seizures where she loses consciousness since 2013.  She has not had her thyroid evaluated yet.  She continues to smoke. She thinks the higher dose of Topamax makes her feel fatigued.    Update January 30, 2033 SS: Remains on Topamax 200 mg twice a day, no further seizures.  Has had a lot of personal stress going on.  Is glad she is received both Covid vaccines.  She does report difficulty with her focus and fatigue.  She does not have much motivation.  Has not followed up with PCP.  Presents today unaccompanied.  Update February 03, 2034 SS: Is out of Topamax 200 mg twice daily, ran out this morning. No seizures of recent. Doing well with Topamax. Within last 6 months has been losing weight, down to 89 lbs. Previous spells described as strange smell, then staring spells, on Topamax 150 mg twice daily. EEG normal in Oct 2020. Has not been to PCP to discuss weight loss, smokes 1 pack a day. Has abnormal Pap smear, gets twice a year. TSH is abnormal high. Has the name of 5 primary care doctors.   REVIEW OF SYSTEMS: Out of a complete 14 system review of symptoms, the patient complains only of the following symptoms, and all other reviewed systems are negative.  Seizures   ALLERGIES: Allergies  Allergen Reactions   Azithromycin Nausea And Vomiting   Erythromycin     Sensitivity (GI)   Percocet [Oxycodone-Acetaminophen]    Penicillins Itching and Rash    HOME MEDICATIONS: Outpatient Medications Prior to Visit  Medication Sig Dispense Refill   estrogens, conjugated, (PREMARIN) 1.25 MG tablet Take 1.25 mg by mouth.     naproxen sodium (ANAPROX) 220 MG tablet Take 220 mg by mouth 2 (two) times daily as needed. For headache     progesterone (PROMETRIUM) 200 MG capsule Take 200 mg by mouth.     topiramate (TOPAMAX) 200 MG tablet Take 1 tablet (200 mg total) by mouth 2 (two) times daily. 180 tablet 3   No facility-administered  medications prior to visit.    PAST MEDICAL HISTORY: Past Medical History:  Diagnosis Date   Seizures ([place])     PAST SURGICAL HISTORY: No past surgical history on file.  FAMILY HISTORY: Family History  Problem Relation Age of Onset   Diabetes Mother    Cancer Mother        breast   Stroke Mother    Heart Problems Father    Diabetes Sister     SOCIAL HISTORY: Social History   Socioeconomic History   Marital status: Widowed    Spouse name: Not on file   Number of children: Not on file   Years of education: Not on file   Highest education level: Not on file  Occupational History   Not on file  Tobacco Use   Smoking status: Every Day    Packs/day: 1.00    Pack years: 0.00    Types: Cigarettes   Smokeless tobacco: Never  Substance and Sexual Activity   Alcohol use: Yes    Alcohol/week: 0.0 standard drinks    Comment: rare   Drug use: No   Sexual activity: Not on file  Other Topics Concern   Not on file  Social History Narrative   Not on file   Social Determinants of Health   Financial Resource Strain: Not on file  Food Insecurity: Not on file  Transportation Needs: Not on file  Physical Activity: Not on file  Stress: Not on file  Social Connections: Not on file  Intimate Partner Violence: Not on file   PHYSICAL EXAM  There were no vitals filed for this visit.  There is no height or weight on file to calculate BMI.  Generalized: Well developed, in no acute distress   Neurological examination  Mentation: Alert oriented to time, place, history taking. Follows all commands speech and language fluent Cranial nerve II-XII: Pupils were equal round reactive to light. Extraocular movements were full, visual field were full on confrontational test. Facial sensation and strength were normal. Head turning and shoulder shrug  were normal and symmetric. Motor: The motor testing reveals 5 over 5 strength of all 4 extremities. Good symmetric motor tone is noted  throughout.  Sensory: Sensory testing is intact to soft touch on all 4 extremities. No evidence of extinction is noted.  Coordination: Cerebellar testing reveals good finger-nose-finger and heel-to-shin bilaterally.  Gait and station: Gait is normal.  Reflexes: Deep tendon reflexes are symmetric and normal bilaterally.   DIAGNOSTIC DATA (LABS, IMAGING, TESTING) - I reviewed patient records, labs, notes, testing and imaging myself where available.  Lab Results  Component Value Date   WBC 7.6 04/23/2019   HGB 13.9 04/23/2019   HCT 42.9 04/23/2019   MCV 91 04/23/2019   PLT 160 06/14/2012      Component Value Date/Time   NA 140 04/23/2019 1021   K 4.4 04/23/2019 1021   CL 107 (H) 04/23/2019 1021   CO2 21 04/23/2019 1021   GLUCOSE 84 04/23/2019 1021   GLUCOSE 175 (  H) 06/14/2012 1728   BUN 19 04/23/2019 1021   CREATININE 0.81 04/23/2019 1021   CALCIUM 9.2 04/23/2019 1021   PROT 6.6 04/23/2019 1021   ALBUMIN 4.2 04/23/2019 1021   AST 17 04/23/2019 1021   ALT 16 04/23/2019 1021   ALKPHOS 62 04/23/2019 1021   BILITOT <0.2 04/23/2019 1021   GFRNONAA 76 04/23/2019 1021   GFRAA 88 04/23/2019 1021   No results found for: CHOL, HDL, LDLCALC, LDLDIRECT, TRIG, CHOLHDL No results found for: HGBA1C No results found for: VITAMINB12 Lab Results  Component Value Date   TSH 4.890 (H) 04/23/2019    ASSESSMENT AND PLAN 64 y.o. year old female  has a past medical history of Seizures ([place]). here with:  1.  Epilepsy -Generalized versus complex partial seizure -Recent EEG was normal October 2020, previous EEG was abnormal, evidence of generalized epileptiform discharge -Cut back Topamax 150 mg AM/keep 200 mg PM due to weight loss, did not completely switch Topamax, as his weight loss has been over the last 6 to 12 months, could be other factors that have not been explored such as tobacco abuse, abnormal Pap smear, elevated TSH; never been on anything other than Topamax -She previously had  seizure-like spells on Topamax 150 mg twice daily, with the dose reduction, she will let me know immediately if she has any near seizure events -She will follow-up with a primary care doctor to explore other etiologies of weight loss, I will see her back in 6 months, need to follow-up on the weight loss, determine if Topamax needs to be switched completely  Butler Denmark, AGNP-C, DNP 02/03/2021, 5:50 AM [facility] [address], [place], [place] [address] [phone]

## 2021-02-11 ENCOUNTER — Other Ambulatory Visit: Payer: Self-pay

## 2021-02-11 ENCOUNTER — Ambulatory Visit
Admission: RE | Admit: 2021-02-11 | Discharge: 2021-02-11 | Disposition: A | Discharge status: Home / Self Care | Payer: Medicare Other | Source: Ambulatory Visit | Attending: Urgent Care | Admitting: Urgent Care

## 2021-02-11 VITALS — BP 173/88 | HR 80 | Temp 97.9°F | Resp 18

## 2021-02-11 DIAGNOSIS — F17200 Nicotine dependence, unspecified, uncomplicated: Secondary | ICD-10-CM

## 2021-02-11 DIAGNOSIS — R0981 Nasal congestion: Secondary | ICD-10-CM

## 2021-02-11 DIAGNOSIS — J069 Acute upper respiratory infection, unspecified: Secondary | ICD-10-CM | POA: Diagnosis not present

## 2021-02-11 DIAGNOSIS — R059 Cough, unspecified: Secondary | ICD-10-CM

## 2021-02-11 DIAGNOSIS — R070 Pain in throat: Secondary | ICD-10-CM

## 2021-02-11 MED ORDER — HYDROXYZINE HCL 25 MG PO TABS: 12.5000 mg | ORAL_TABLET | Freq: Three times a day (TID) | ORAL | 0 refills | Status: DC | PRN

## 2021-02-11 MED ORDER — PREDNISONE 10 MG PO TABS: 10.0000 mg | ORAL_TABLET | Freq: Every day | ORAL | 0 refills | Status: DC

## 2021-02-11 MED ORDER — PROMETHAZINE-DM 6.25-15 MG/5ML PO SYRP: 5.0000 mL | ORAL_SOLUTION | Freq: Every evening | ORAL | 0 refills | Status: DC | PRN

## 2021-02-11 MED ORDER — DOXYCYCLINE HYCLATE 100 MG PO CAPS: 100.0000 mg | ORAL_CAPSULE | Freq: Two times a day (BID) | ORAL | 0 refills | Status: DC

## 2021-02-11 NOTE — ED Provider Notes (Signed)
[facility]   MRN: [id number] DOB: [birth year]  Subjective:   Christie Moreno is a 65 y.o. female presenting for 1 month history of persistent cough, congestion, intermittently sore throat.  Cough is worse at night.  She is also had bilateral lower leg swelling past 10 days. Has a history of COPD.  Has a longstanding history of smoking.  Has never been evaluated for peripheral vascular disease.  Denies chest pain, shortness of breath.  At different times she has felt like there is an infection in her sinuses and her chest.  Does not want COVID-19 testing.  No current facility-administered medications for this encounter.  Current Outpatient Medications:    estrogens, conjugated, (PREMARIN) 1.25 MG tablet, Take 1.25 mg by mouth., Disp: , Rfl:    naproxen sodium (ANAPROX) 220 MG tablet, Take 220 mg by mouth 2 (two) times daily as needed. For headache, Disp: , Rfl:    progesterone (PROMETRIUM) 200 MG capsule, Take 200 mg by mouth., Disp: , Rfl:    topiramate (TOPAMAX) 100 MG tablet, Take 1.5 tablet in the morning, taking 2 tablet at bedtime, Disp: 360 tablet, Rfl: 3   Allergies  Allergen Reactions   Azithromycin Nausea And Vomiting   Erythromycin     Sensitivity (GI)   Percocet [Oxycodone-Acetaminophen]    Penicillins Itching and Rash    Past Medical History:  Diagnosis Date   Seizures ([place])      History reviewed. No pertinent surgical history.  Family History  Problem Relation Age of Onset   Diabetes Mother    Cancer Mother        breast   Stroke Mother    Heart Problems Father    Diabetes Sister     Social History   Tobacco Use   Smoking status: Every Day    Packs/day: 1.00    Pack years: 0.00    Types: Cigarettes   Smokeless tobacco: Never  Substance Use Topics   Alcohol use: Yes    Alcohol/week: 0.0 standard drinks    Comment: rare   Drug use: No    ROS   Objective:   Vitals: BP (!) 173/88 (BP Location: Right Arm)   Pulse 80   Temp  97.9 F (36.6 C) (Oral)   Resp 18   SpO2 99%   Physical Exam Constitutional:      General: She is not in acute distress.    Appearance: Normal appearance. She is well-developed. She is not ill-appearing, toxic-appearing or diaphoretic.  HENT:     Head: Normocephalic and atraumatic.     Right Ear: External ear normal.     Left Ear: External ear normal.     Nose: Nose normal.     Mouth/Throat:     Mouth: Mucous membranes are moist.     Comments: Significant postnasal drainage overlying pharynx. Eyes:     General: No scleral icterus.       Right eye: No discharge.        Left eye: No discharge.     Extraocular Movements: Extraocular movements intact.     Conjunctiva/sclera: Conjunctivae normal.     Pupils: Pupils are equal, round, and reactive to light.  Cardiovascular:     Rate and Rhythm: Normal rate and regular rhythm.     Pulses: Normal pulses.     Heart sounds: Normal heart sounds. No murmur heard.   No friction rub. No gallop.  Pulmonary:     Effort: Pulmonary effort is normal. No respiratory distress.  Breath sounds: Normal breath sounds. No stridor. No wheezing, rhonchi or rales.  Musculoskeletal:        General: No tenderness.     Right lower leg: Edema present.     Left lower leg: Edema present.  Skin:    General: Skin is warm and dry.     Findings: No rash.  Neurological:     Mental Status: She is alert and oriented to person, place, and time.  Psychiatric:        Mood and Affect: Mood normal.        Behavior: Behavior normal.        Thought Content: Thought content normal.        Judgment: Judgment normal.    Assessment and Plan :   PDMP not reviewed this encounter.  1. Acute upper respiratory infection   2. Nasal congestion   3. Cough   4. Throat pain     Patient has a clear cardiopulmonary exam, therefore deferred imaging.  Furthermore, unfortunately we do not have a radiology technician onsite and cannot perform this chest x-ray.  Recommended a  course of doxycycline to address what I suspect is a sinusitis.  We will also use a prednisone course in light of her smoking history, persistent cough and sinusitis.  Use supportive care otherwise.  Recommended using compression socks and pursuing an evaluation with vascular vein specialist for a consultation regarding her lower leg swelling and possibility of peripheral vascular disease given her smoking history.  Patient refused COVID-19 testing. Counseled patient on potential for adverse effects with medications prescribed/recommended today, ER and return-to-clinic precautions discussed, patient verbalized understanding.    Jaynee Eagles, [place] 02/11/21 1732

## 2021-02-11 NOTE — ED Triage Notes (Signed)
One month h/o cough, congestion and intermittent sore throat. Cough is worse at night. Pt c/o swelling in LLE for 10 days.

## 2021-02-11 NOTE — Discharge Instructions (Addendum)
Please check with your regular doctor about a referral to a vascular vein specialist to have an evaluation for peripheral vascular disease.

## 2021-02-27 ENCOUNTER — Encounter (HOSPITAL_COMMUNITY): Payer: Self-pay

## 2021-07-04 NOTE — Progress Notes (Signed)
Chart reviewed, agree above plan ?

## 2021-08-10 ENCOUNTER — Ambulatory Visit (INDEPENDENT_AMBULATORY_CARE_PROVIDER_SITE_OTHER): Payer: Medicare Other | Admitting: Neurology

## 2021-08-10 ENCOUNTER — Encounter: Payer: Self-pay | Admitting: Neurology

## 2021-08-10 VITALS — BP 177/73 | HR 77 | Ht 62.0 in | Wt 89.0 lb

## 2021-08-10 DIAGNOSIS — F419 Anxiety disorder, unspecified: Secondary | ICD-10-CM

## 2021-08-10 DIAGNOSIS — N870 Mild cervical dysplasia: Secondary | ICD-10-CM | POA: Insufficient documentation

## 2021-08-10 DIAGNOSIS — G40209 Localization-related (focal) (partial) symptomatic epilepsy and epileptic syndromes with complex partial seizures, not intractable, without status epilepticus: Secondary | ICD-10-CM

## 2021-08-10 MED ORDER — LAMOTRIGINE 100 MG PO TABS: 100.0000 mg | ORAL_TABLET | Freq: Two times a day (BID) | ORAL | 11 refills | Status: DC

## 2021-08-10 MED ORDER — LAMOTRIGINE 25 MG PO TABS: ORAL_TABLET | ORAL | 0 refills | Status: DC

## 2021-08-10 NOTE — Progress Notes (Signed)
Chief Complaint  Patient presents with   Follow-up    Rm 13. Alone. No PCP. Pt denies any new seizures. Pt c/o twitching in lips or lip spasms beginning last week, for two or three days. C/o numbness in right fingertips occasionally.      ASSESSMENT AND PLAN  Christie Moreno is a 66 y.o. 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   MEDICAL HISTORY:  Christie Moreno, is a 66 year old female, follow-up for seizure  November 2013. had a history of chronic headaches, increased frequency over the past couple years, daily basis, vortex area moderate to severe pressure headaches, occasionally of his visual change      Over past 6 months, she had frequent confusion spells, it can happen up to 5 episode in a day, she was eating, suddenly had a glaring stare in her eyes, unresponsive, stirring her soup with her fingers, had violent cough, came around in few minutes.   There was also multiple episode during sleep of making sucking noise, unpurposeful hand movement, confused unresponsive lasting few minutes,   She denies significant visual change, no lateralized motor or sensory deficit,   She presented to the emergency room June 14 2012 following one episode, MRI of the brain with and without contrast showed few scattered deep subcortical white matter disease, no contrast enhancement most consistent with small vessel disease.     UPDATE Sept 21 2020: She complains of occasionally recurrent confusion spells, often preceded by strong sense of strange smell, then staring spells, lasting for few seconds, there was no generalized tonic-clonic activity, despite taking  Topamax 50 mg 3 tablets twice a day, it happened about couple times each month,   Previous EEG was abnormal, there was evidence of generalized epileptiform discharges.   Update July 03, 2019 SS: After last visit her Topamax was increased to 200 mg twice a day, laboratory evaluation indicated mild elevated TSH, EEG 05/14/2019 was normal, Topamax level was therapeutic.   With the increase in Topamax, she denies further episodes of strong smells.  She reports she has continued to have a few staring episodes, feeling she cannot focus, she does not lose consciousness, she is aware the spells are happening.  She indicates the spells have been occurring on and off for years.  She has not had seizures where she loses consciousness since 2013.  She has not had her thyroid evaluated yet.  She continues to smoke. She thinks the higher dose of Topamax makes her feel fatigued.    Update August 10, 2021 She has been treated with Topamax up to 200 mg twice a day, continues to smoke at least a pack a day, continue complains of intermittent spell of smell something funny,  She recently attributed that it is due to her husband's heart attack and his medication changes, but ever since her husband's illness, significant medication changes, and constant strong smell around him, she no longer have recurrent episodic strange smell  Denied loss of consciousness, generalized seizure episode  She continue to lose weight, less than 100 pounds today, decreased appetite, does complains of depression anxiety  PHYSICAL EXAM:   Vitals:   08/10/21 1500  BP: (!) 177/73  Pulse: 77  Weight: 89 lb (40.4 kg)  Height: 5\' 2"  (1.575 m)   Not recorded     Body mass index is 16.28 kg/m.  PHYSICAL EXAMNIATION: Thin depressed looking female  Gen: NAD, conversant, well nourised, well groomed                     Cardiovascular: Regular rate rhythm, no peripheral edema, warm, nontender. Eyes: Conjunctivae clear without  exudates or hemorrhage Neck: Supple, no carotid bruits. Pulmonary: Clear to auscultation bilaterally   NEUROLOGICAL EXAM:  MENTAL STATUS: Speech/cognition: Awake, alert, oriented to history taking and casual conversation  CRANIAL NERVES: CN II: Visual fields are full to confrontation. Pupils are round equal and briskly reactive to light. CN III, IV, VI: extraocular movement are normal. No ptosis. CN V: Facial sensation is intact to light touch CN VII: Face is symmetric with normal eye closure  CN VIII: Hearing is normal to causal conversation. CN IX, X: Phonation is normal. CN XI: Head turning and shoulder shrug are intact  MOTOR: There is no pronator drift of out-stretched arms. Muscle bulk and tone are normal. Muscle strength is normal.  REFLEXES: Reflexes are 2+ and symmetric at the biceps, triceps, knees, and ankles. Plantar responses are flexor.  SENSORY: Intact to light touch, pinprick and vibratory sensation are intact in fingers and toes.  COORDINATION: There is no trunk or limb dysmetria noted.  GAIT/STANCE: Gait is steady  REVIEW OF SYSTEMS:  Full 14 system review of systems performed and notable only for as above All other review of systems were negative.   ALLERGIES: Allergies  Allergen Reactions   Azithromycin Nausea And Vomiting   Erythromycin     Sensitivity (GI)   Percocet [Oxycodone-Acetaminophen]    Penicillins Itching and Rash    HOME MEDICATIONS: Current Outpatient Medications  Medication Sig Dispense Refill   estrogens, conjugated, (PREMARIN) 1.25 MG tablet Take 1.25 mg by mouth.     hydrOXYzine (ATARAX/VISTARIL) 25 MG tablet Take 0.5 tablets (12.5 mg total) by mouth every 8 (eight) hours as needed for itching. 30 tablet 0   naproxen sodium (ANAPROX) 220 MG tablet Take 220 mg by mouth 2 (two) times daily as needed. For headache     predniSONE (DELTASONE) 10 MG tablet Take 1 tablet (10 mg total) by mouth daily with breakfast. 15 tablet 0    progesterone (PROMETRIUM) 200 MG capsule Take 200 mg by mouth.     topiramate (TOPAMAX) 100 MG tablet Take 1.5 tablet in the morning, taking 2 tablet at bedtime 360 tablet 3   No current facility-administered medications for this visit.    PAST MEDICAL HISTORY: Past Medical History:  Diagnosis Date   Seizures ([place])     PAST SURGICAL HISTORY: History reviewed. No pertinent surgical history.  FAMILY HISTORY: Family History  Problem Relation Age of Onset   Diabetes Mother    Cancer Mother        breast   Stroke Mother    Heart Problems Father    Diabetes Sister     SOCIAL HISTORY: Social History   Socioeconomic History   Marital status: Widowed    Spouse name: Not on file   Number of children: Not on file   Years of education: Not on file   Highest education level: Not on file  Occupational History   Not on file  Tobacco Use   Smoking status: Every Day    Packs/day: 1.00    Types:  Cigarettes   Smokeless tobacco: Never  Substance and Sexual Activity   Alcohol use: Yes    Alcohol/week: 0.0 standard drinks    Comment: rare   Drug use: No   Sexual activity: Not on file  Other Topics Concern   Not on file  Social History Narrative   Not on file   Social Determinants of Health   Financial Resource Strain: Not on file  Food Insecurity: Not on file  Transportation Needs: Not on file  Physical Activity: Not on file  Stress: Not on file  Social Connections: Not on file  Intimate Partner Violence: Not on file      Marcial Pacas, M.D. Ph.D.  [facility] [address] [place], [place] [address] Ph: [phone] Fax: [phone]  CC:  No referring provider defined for this encounter.  Pcp, No

## 2021-08-10 NOTE — Patient Instructions (Addendum)
Weeks Topamax 100 (am/pm) Lamotrigine 25mg  (am/pm)  1st 1.5/2 1/1  2nd 1/2 2/2  3rd 1/1 3/3  4th 0/1 Lamotrigine 100mg  (1/1)  5th 0/0 Lamotrigine 100mg  twice a day

## 2021-08-18 ENCOUNTER — Ambulatory Visit (INDEPENDENT_AMBULATORY_CARE_PROVIDER_SITE_OTHER): Payer: Medicare Other | Admitting: Neurology

## 2021-08-18 DIAGNOSIS — G40209 Localization-related (focal) (partial) symptomatic epilepsy and epileptic syndromes with complex partial seizures, not intractable, without status epilepticus: Secondary | ICD-10-CM

## 2021-08-18 DIAGNOSIS — F419 Anxiety disorder, unspecified: Secondary | ICD-10-CM

## 2021-08-20 ENCOUNTER — Telehealth: Payer: Self-pay | Admitting: Neurology

## 2021-08-20 NOTE — Telephone Encounter (Signed)
Pt is asking for a call from RN to discuss how she has only been taking her topiramate (TOPAMAX) 100 MG tablet Pt states she thought for some reason she was supposed to start : lamoTRIgine (LAMICTAL) 100 MG tablet and lamoTRIgine (LAMICTAL) 25 MG tablet After her EEG.  Pt is asking for a call from RN to discuss.

## 2021-08-20 NOTE — Telephone Encounter (Addendum)
The patient was provided the following instruction chart at her visit on 08/10/21:   We need to confirm she is following the directions correctly.

## 2021-08-20 NOTE — Telephone Encounter (Signed)
I called the patient. States she was confused on when to start transition to the lamotrigine. She thought she was suppose to wait until after her EEG (completed on 08/18/21). She has verbalized understanding of the instructions today and will start the making the change.

## 2021-08-28 ENCOUNTER — Telehealth: Payer: Self-pay | Admitting: Neurology

## 2021-08-28 NOTE — Telephone Encounter (Signed)
Pt has asked this message be sent to the On call Dr as a result of her being out of her topiramate (TOPAMAX) 100 MG tablet And does not know how many she needs to order.  Pt is asking the medication be called into [facility] [phone]

## 2021-08-31 NOTE — Procedures (Signed)
° °  HISTORY: 66 year old female presenting with frequent confusion spells  TECHNIQUE:  This is a routine 16 channel EEG recording with one channel devoted to a limited EKG recording.  It was performed during wakefulness, drowsiness and asleep.  Hyperventilation and photic stimulation were performed as activating procedures.  There are frequent muscle and movement artifact noted.  Upon maximum arousal, posterior dominant waking rhythm consistent of rhythmic alpha range activity, with frequency of 10 hz. Activities are symmetric over the bilateral posterior derivations and attenuated with eye opening.  Hyperventilation produced mild/moderate buildup with higher amplitude and the slower activities noted.  Photic stimulation did not alter the tracing.  During EEG recording, patient developed drowsiness and no deeper stage of sleep was achieved During EEG recording, there was no epileptiform discharge noted.  EKG demonstrate sinus rhythm, with heart rate of 72 bpm  CONCLUSION: This is a  normal awake EEG.  There is no electrodiagnostic evidence of epileptiform discharge.  Marcial Pacas, M.D. Ph.D.  [facility] [place], [place] [address] Phone: [phone] Fax:      [phone]

## 2021-09-01 NOTE — Telephone Encounter (Signed)
I spoke to the patient. She was able to get topiramate picked up at the pharmacy (refill was on file). She only purchased enough tablets to get her through the transition from topiramate to lamotrigine.

## 2021-09-12 ENCOUNTER — Other Ambulatory Visit: Payer: Self-pay | Admitting: Neurology

## 2021-10-08 ENCOUNTER — Telehealth: Payer: Self-pay | Admitting: Neurology

## 2021-10-08 DIAGNOSIS — G40209 Localization-related (focal) (partial) symptomatic epilepsy and epileptic syndromes with complex partial seizures, not intractable, without status epilepticus: Secondary | ICD-10-CM

## 2021-10-08 DIAGNOSIS — R5382 Chronic fatigue, unspecified: Secondary | ICD-10-CM

## 2021-10-08 NOTE — Telephone Encounter (Signed)
Pt feels she is having a reaction to lamoTRIgine (LAMICTAL) 100 MG tablet. Shortness of breath when lying down, balance is off, falling to sleep at odd times. Would like a call from the nurse. ?

## 2021-10-08 NOTE — Telephone Encounter (Signed)
I called the pt back. She sts she has noticed since starting the Lamotrigine she has had shortness of breath at night, falling asleep at odd times, off balance ( drunk feeling). Sx have been going on for the last 1.5 week.  ? ?Pt confirmed she is currently taking, 100 mg bid of the Lamotrigine. She has successfully tapered off  her topamax last dosage of this was 09/18/21. ? ?Pt wanted to know if her dosage of the Lamotrigine should be adjusted? ?

## 2021-10-08 NOTE — Telephone Encounter (Signed)
Orders Placed This Encounter  ?Procedures  ? Lamotrigine level  ? TSH  ? CBC with Differential/Platelet  ? Comprehensive metabolic panel  ? ?   ?

## 2021-10-08 NOTE — Telephone Encounter (Signed)
Per v/o from Dr. Krista Blue ok to continue Lamotrigine over the weekend but to plan to be at the office on Monday (10/12/21) for lab draw. Pt was advised to hold Lamotrigine 100 mg until after the blood draw and then take directly after.  ?Pt was advised once we receive results of labs we would be in touch. ?

## 2021-10-12 ENCOUNTER — Other Ambulatory Visit (INDEPENDENT_AMBULATORY_CARE_PROVIDER_SITE_OTHER): Payer: Self-pay

## 2021-10-12 DIAGNOSIS — G40209 Localization-related (focal) (partial) symptomatic epilepsy and epileptic syndromes with complex partial seizures, not intractable, without status epilepticus: Secondary | ICD-10-CM

## 2021-10-12 DIAGNOSIS — Z0289 Encounter for other administrative examinations: Secondary | ICD-10-CM

## 2021-10-12 DIAGNOSIS — R5382 Chronic fatigue, unspecified: Secondary | ICD-10-CM

## 2021-10-14 ENCOUNTER — Telehealth: Payer: Self-pay | Admitting: Neurology

## 2021-10-14 LAB — CBC WITH DIFFERENTIAL/PLATELET
Basophils Absolute: 0 10*3/uL (ref 0.0–0.2)
Basos: 1 %
EOS (ABSOLUTE): 0.2 10*3/uL (ref 0.0–0.4)
Eos: 3 %
Hematocrit: 40.7 % (ref 34.0–46.6)
Hemoglobin: 13.7 g/dL (ref 11.1–15.9)
Immature Grans (Abs): 0 10*3/uL (ref 0.0–0.1)
Immature Granulocytes: 0 %
Lymphocytes Absolute: 1.5 10*3/uL (ref 0.7–3.1)
Lymphs: 24 %
MCH: 29.6 pg (ref 26.6–33.0)
MCHC: 33.7 g/dL (ref 31.5–35.7)
MCV: 88 fL (ref 79–97)
Monocytes Absolute: 0.6 10*3/uL (ref 0.1–0.9)
Monocytes: 10 %
Neutrophils Absolute: 3.8 10*3/uL (ref 1.4–7.0)
Neutrophils: 62 %
Platelets: 120 10*3/uL — ABNORMAL LOW (ref 150–450)
RBC: 4.63 x10E6/uL (ref 3.77–5.28)
RDW: 12.6 % (ref 11.7–15.4)
WBC: 6.1 10*3/uL (ref 3.4–10.8)

## 2021-10-14 LAB — COMPREHENSIVE METABOLIC PANEL
ALT: 22 IU/L (ref 0–32)
AST: 24 IU/L (ref 0–40)
Albumin/Globulin Ratio: 2.2 (ref 1.2–2.2)
Albumin: 4.3 g/dL (ref 3.8–4.8)
Alkaline Phosphatase: 86 IU/L (ref 44–121)
BUN/Creatinine Ratio: 23 (ref 12–28)
BUN: 23 mg/dL (ref 8–27)
Bilirubin Total: 0.3 mg/dL (ref 0.0–1.2)
CO2: 25 mmol/L (ref 20–29)
Calcium: 9.4 mg/dL (ref 8.7–10.3)
Chloride: 100 mmol/L (ref 96–106)
Creatinine, Ser: 0.98 mg/dL (ref 0.57–1.00)
Globulin, Total: 2 g/dL (ref 1.5–4.5)
Glucose: 85 mg/dL (ref 70–99)
Potassium: 3.9 mmol/L (ref 3.5–5.2)
Sodium: 140 mmol/L (ref 134–144)
Total Protein: 6.3 g/dL (ref 6.0–8.5)
eGFR: 63 mL/min/{1.73_m2} (ref >59)

## 2021-10-14 LAB — LAMOTRIGINE LEVEL: Lamotrigine Lvl: 5 ug/mL (ref 2.0–20.0)

## 2021-10-14 LAB — TSH: TSH: 6.5 u[IU]/mL — ABNORMAL HIGH (ref 0.450–4.500)

## 2021-10-14 NOTE — Telephone Encounter (Signed)
I spoke to the patient. She verbalized understanding of the lab findings. She does not have an established PCP ([place] She will contact her insurance plan today for a list of physicians she can call to establish care.  ?

## 2021-10-14 NOTE — Telephone Encounter (Signed)
Please call patient, laboratory evaluation showed elevated TSH 6.5, indicating hypothyroidism, ? ?Mildly low platelet 120, otherwise normal CBC, may consider repeat test later. ? ?Rest of the laboratory evaluation showed no significant abnormality. ? ?I was not able to fax the lab result to her primary care listed, please make sure patient follow-up with her primary care physician for the treatment of probable hypothyroidism ?

## 2021-10-29 ENCOUNTER — Other Ambulatory Visit: Payer: Self-pay | Admitting: Obstetrics and Gynecology

## 2021-10-29 DIAGNOSIS — N6489 Other specified disorders of breast: Secondary | ICD-10-CM

## 2021-10-30 ENCOUNTER — Ambulatory Visit
Admission: EM | Admit: 2021-10-30 | Discharge: 2021-10-30 | Disposition: A | Discharge status: Home / Self Care | Payer: Medicare Other | Attending: Physician Assistant | Admitting: Physician Assistant

## 2021-10-30 DIAGNOSIS — I10 Essential (primary) hypertension: Secondary | ICD-10-CM | POA: Diagnosis not present

## 2021-10-30 DIAGNOSIS — J209 Acute bronchitis, unspecified: Secondary | ICD-10-CM | POA: Diagnosis not present

## 2021-10-30 MED ORDER — PREDNISONE 20 MG PO TABS: 40.0000 mg | ORAL_TABLET | Freq: Every day | ORAL | 0 refills | Status: DC

## 2021-10-30 NOTE — Discharge Instructions (Signed)
Please follow up with PCP.

## 2021-10-30 NOTE — ED Triage Notes (Signed)
Pt c/o cough that causes her SOB that happens mostly at night but can happen in the day. Asked if she currently is experiencing SOB pt states "just a touch." Does not appear in distress. It is hard for her to form and express thoughts, family at bedside often interjects.  ?

## 2021-10-30 NOTE — ED Provider Notes (Signed)
?[facility] ? ? ? ?CSN: [id number] ?Arrival date & time: 10/30/21  1323 ? ? ?  ? ?History   ?Chief Complaint ?Chief Complaint  ?Patient presents with  ? Cough  ? ? ?HPI ?Christie Moreno is a 66 y.o. female.  ? ?Patient here today for evaluation of cough with some associated shortness of breath that started over a week ago. She is a smoker but does not have official diagnosis of COPD. She does not report chest pain but does report that she has had some palpitations where she feels her heart is racing when she feels short of breath. Per her significant other last night it seemed as if she was hyperventilating. She has not had fever. She does not have PCP.  ? ?The history is provided by the patient.  ?Cough ?Associated symptoms: shortness of breath   ?Associated symptoms: no chest pain, no chills, no eye discharge, no fever and no rhinorrhea   ? ?Past Medical History:  ?Diagnosis Date  ? Seizures ([place])   ? ? ?Patient Active Problem List  ? Diagnosis Date Noted  ? 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 Anxiety 08/10/2021  ? Partial symptomatic epilepsy with complex partial seizures, not intractable, without status epilepticus ([place]) 04/23/2019  ? Complex partial seizure ([place]) 04/22/2015  ? Convulsions ([place]) 04/19/2013  ? ? ?History reviewed. No pertinent surgical history. ? ?OB History   ?No obstetric history on file. ?  ? ? ? ?Home Medications   ? ?Prior to Admission medications   ?Medication Sig Start Date End Date Taking? Authorizing Provider  ?predniSONE (DELTASONE) 20 MG tablet Take 2 tablets (40 mg total) by mouth daily with breakfast for 5 days. 10/30/21 11/04/21 Yes Francene Finders, PA-C  ?estrogens, conjugated, (PREMARIN) 1.25 MG tablet Take 1.25 mg by mouth.    [provider]  ?hydrOXYzine (ATARAX/VISTARIL) 25 MG tablet Take 0.5 tablets (12.5 mg total) by mouth every 8 (eight) hours as needed for itching. 02/11/21   Jaynee Eagles, PA-C  ?lamoTRIgine (LAMICTAL) 100 MG tablet  Take 1 tablet (100 mg total) by mouth 2 (two) times daily. 08/10/21   Marcial Pacas, MD  ?lamoTRIgine (LAMICTAL) 25 MG tablet 1 tab bid x one week ?2 tab bid x 2nd week ?3 tab bid x 3rd week 08/10/21   Marcial Pacas, MD  ?naproxen sodium (ANAPROX) 220 MG tablet Take 220 mg by mouth 2 (two) times daily as needed. For headache    [provider]  ?progesterone (PROMETRIUM) 200 MG capsule Take 200 mg by mouth.    [provider]  ?topiramate (TOPAMAX) 100 MG tablet Take 1.5 tablet in the morning, taking 2 tablet at bedtime 02/03/21   Suzzanne Cloud, NP  ? ? ?Family History ?Family History  ?Problem Relation Age of Onset  ? Diabetes Mother   ? Cancer Mother   ?     breast  ? Stroke Mother   ? Heart Problems Father   ? Diabetes Sister   ? ? ?Social History ?Social History  ? ?Tobacco Use  ? Smoking status: Every Day  ?  Packs/day: 1.00  ?  Types: Cigarettes  ? Smokeless tobacco: Never  ?Substance Use Topics  ? Alcohol use: Yes  ?  Alcohol/week: 0.0 standard drinks  ?  Comment: rare  ? Drug use: No  ? ? ? ?Allergies   ?Azithromycin, Erythromycin, Percocet [oxycodone-acetaminophen], and Penicillins ? ? ?Review of Systems ?Review of Systems  ?Constitutional:  Negative for chills and  fever.  ?HENT:  Negative for congestion and rhinorrhea.   ?Eyes:  Negative for discharge and redness.  ?Respiratory:  Positive for cough and shortness of breath.   ?Cardiovascular:  Positive for palpitations. Negative for chest pain.  ?Gastrointestinal:  Negative for abdominal pain, nausea and vomiting.  ? ? ?Physical Exam ?Triage Vital Signs ?ED Triage Vitals [10/30/21 1330]  ?Enc Vitals Group  ?   BP (!) 184/113  ?   Pulse Rate 94  ?   Resp 18  ?   Temp 97.8 ?F (36.6 ?C)  ?   Temp Source Oral  ?   SpO2 100 %  ?   Weight   ?   Height   ?   Head Circumference   ?   Peak Flow   ?   Pain Score 0  ?   Pain Loc   ?   Pain Edu?   ?   Excl. in [place]?   ? ?No data found. ? ?Updated Vital Signs ?BP (!) 177/101   Pulse 94   Temp 97.8 ?F (36.6 ?C)  (Oral)   Resp 18   SpO2 100%  ?   ? ?Physical Exam ?Vitals and nursing note reviewed.  ?Constitutional:   ?   General: She is not in acute distress. ?   Appearance: Normal appearance. She is not ill-appearing.  ?HENT:  ?   Head: Normocephalic and atraumatic.  ?Eyes:  ?   Conjunctiva/sclera: Conjunctivae normal.  ?Cardiovascular:  ?   Rate and Rhythm: Normal rate and regular rhythm.  ?Pulmonary:  ?   Effort: Pulmonary effort is normal. No respiratory distress.  ?   Breath sounds: Normal breath sounds. No wheezing, rhonchi or rales.  ?Neurological:  ?   Mental Status: She is alert.  ?Psychiatric:     ?   Mood and Affect: Mood normal.     ?   Behavior: Behavior normal.     ?   Thought Content: Thought content normal.  ? ? ? ?UC Treatments / Results  ?Labs ?(all labs ordered are listed, but only abnormal results are displayed) ?Labs Reviewed - No data to display ? ?EKG ? ? ?Radiology ?No results found. ? ?Procedures ?Procedures (including critical care time) ? ?Medications Ordered in UC ?Medications - No data to display ? ?Initial Impression / Assessment and Plan / UC Course  ?I have reviewed the triage vital signs and the nursing notes. ? ?Pertinent labs & imaging results that were available during my care of the patient were reviewed by me and considered in my medical decision making (see chart for details). ? ?  ?I suspect patient most likely has undiagnosed COPD-will treat to cover bronchitis with prednisone. Low suspicion for cardiac etiology. Recommended she establish care with PCP for further evaluation of elevated blood pressure- patient reports she does not see a provider regularly because blood pressure has not been a problem for her however on chart review it appears her BP has been elevated for most of her recent visits and I did discuss this with her. Recommended she report to ED with any worsening shortness of breath, palpitations, etc.  ? ?Final Clinical Impressions(s) / UC Diagnoses  ? ?Final  diagnoses:  ?Acute bronchitis, unspecified organism  ?Essential hypertension  ? ? ? ?Discharge Instructions   ? ?  ? ?Please follow up with PCP.  ? ? ? ?ED Prescriptions   ? ? Medication Sig Dispense Auth. Provider  ? predniSONE (DELTASONE) 20 MG tablet Take 2  tablets (40 mg total) by mouth daily with breakfast for 5 days. 10 tablet Francene Finders, PA-C  ? ?  ? ?PDMP not reviewed this encounter. ?  ?Francene Finders, PA-C ?10/30/21 1654 ? ?

## 2021-11-02 ENCOUNTER — Emergency Department (HOSPITAL_COMMUNITY): Payer: Medicare Other

## 2021-11-02 ENCOUNTER — Inpatient Hospital Stay (HOSPITAL_COMMUNITY): Payer: Medicare Other

## 2021-11-02 ENCOUNTER — Encounter (HOSPITAL_COMMUNITY): Payer: Self-pay | Admitting: Internal Medicine

## 2021-11-02 ENCOUNTER — Inpatient Hospital Stay (HOSPITAL_COMMUNITY)
Admission: EM | Admit: 2021-11-02 | Discharge: 2021-11-06 | DRG: 280 | Disposition: A | Discharge status: Home / Self Care | Payer: Medicare Other | Attending: Internal Medicine | Admitting: Internal Medicine

## 2021-11-02 DIAGNOSIS — I214 Non-ST elevation (NSTEMI) myocardial infarction: Secondary | ICD-10-CM | POA: Diagnosis present

## 2021-11-02 DIAGNOSIS — R0602 Shortness of breath: Secondary | ICD-10-CM

## 2021-11-02 DIAGNOSIS — Z79899 Other long term (current) drug therapy: Secondary | ICD-10-CM | POA: Diagnosis not present

## 2021-11-02 DIAGNOSIS — I272 Pulmonary hypertension, unspecified: Secondary | ICD-10-CM | POA: Diagnosis present

## 2021-11-02 DIAGNOSIS — Z880 Allergy status to penicillin: Secondary | ICD-10-CM | POA: Diagnosis not present

## 2021-11-02 DIAGNOSIS — I5021 Acute systolic (congestive) heart failure: Secondary | ICD-10-CM | POA: Diagnosis not present

## 2021-11-02 DIAGNOSIS — G40209 Localization-related (focal) (partial) symptomatic epilepsy and epileptic syndromes with complex partial seizures, not intractable, without status epilepticus: Secondary | ICD-10-CM | POA: Diagnosis present

## 2021-11-02 DIAGNOSIS — E871 Hypo-osmolality and hyponatremia: Secondary | ICD-10-CM | POA: Diagnosis present

## 2021-11-02 DIAGNOSIS — F17200 Nicotine dependence, unspecified, uncomplicated: Secondary | ICD-10-CM | POA: Diagnosis present

## 2021-11-02 DIAGNOSIS — I25118 Atherosclerotic heart disease of native coronary artery with other forms of angina pectoris: Secondary | ICD-10-CM | POA: Diagnosis present

## 2021-11-02 DIAGNOSIS — I5043 Acute on chronic combined systolic (congestive) and diastolic (congestive) heart failure: Secondary | ICD-10-CM | POA: Diagnosis present

## 2021-11-02 DIAGNOSIS — J9601 Acute respiratory failure with hypoxia: Secondary | ICD-10-CM | POA: Diagnosis present

## 2021-11-02 DIAGNOSIS — Z681 Body mass index (BMI) 19 or less, adult: Secondary | ICD-10-CM

## 2021-11-02 DIAGNOSIS — I509 Heart failure, unspecified: Secondary | ICD-10-CM

## 2021-11-02 DIAGNOSIS — R9431 Abnormal electrocardiogram [ECG] [EKG]: Secondary | ICD-10-CM | POA: Diagnosis present

## 2021-11-02 DIAGNOSIS — Z823 Family history of stroke: Secondary | ICD-10-CM

## 2021-11-02 DIAGNOSIS — R079 Chest pain, unspecified: Secondary | ICD-10-CM | POA: Diagnosis not present

## 2021-11-02 DIAGNOSIS — Z833 Family history of diabetes mellitus: Secondary | ICD-10-CM

## 2021-11-02 DIAGNOSIS — Z20822 Contact with and (suspected) exposure to covid-19: Secondary | ICD-10-CM | POA: Diagnosis present

## 2021-11-02 DIAGNOSIS — E43 Unspecified severe protein-calorie malnutrition: Secondary | ICD-10-CM | POA: Diagnosis present

## 2021-11-02 DIAGNOSIS — J449 Chronic obstructive pulmonary disease, unspecified: Secondary | ICD-10-CM | POA: Diagnosis present

## 2021-11-02 DIAGNOSIS — G8929 Other chronic pain: Secondary | ICD-10-CM | POA: Diagnosis present

## 2021-11-02 DIAGNOSIS — I2510 Atherosclerotic heart disease of native coronary artery without angina pectoris: Secondary | ICD-10-CM | POA: Diagnosis not present

## 2021-11-02 DIAGNOSIS — Z881 Allergy status to other antibiotic agents status: Secondary | ICD-10-CM

## 2021-11-02 DIAGNOSIS — E079 Disorder of thyroid, unspecified: Secondary | ICD-10-CM | POA: Diagnosis present

## 2021-11-02 DIAGNOSIS — Z886 Allergy status to analgesic agent status: Secondary | ICD-10-CM | POA: Diagnosis not present

## 2021-11-02 DIAGNOSIS — E876 Hypokalemia: Secondary | ICD-10-CM | POA: Diagnosis present

## 2021-11-02 DIAGNOSIS — R519 Headache, unspecified: Secondary | ICD-10-CM | POA: Diagnosis present

## 2021-11-02 DIAGNOSIS — I110 Hypertensive heart disease with heart failure: Secondary | ICD-10-CM | POA: Diagnosis present

## 2021-11-02 DIAGNOSIS — I10 Essential (primary) hypertension: Secondary | ICD-10-CM | POA: Diagnosis not present

## 2021-11-02 DIAGNOSIS — F17210 Nicotine dependence, cigarettes, uncomplicated: Secondary | ICD-10-CM | POA: Diagnosis present

## 2021-11-02 DIAGNOSIS — N179 Acute kidney failure, unspecified: Secondary | ICD-10-CM | POA: Insufficient documentation

## 2021-11-02 DIAGNOSIS — R109 Unspecified abdominal pain: Secondary | ICD-10-CM | POA: Diagnosis present

## 2021-11-02 DIAGNOSIS — J4489 Other specified chronic obstructive pulmonary disease: Secondary | ICD-10-CM | POA: Diagnosis present

## 2021-11-02 DIAGNOSIS — R7989 Other specified abnormal findings of blood chemistry: Secondary | ICD-10-CM | POA: Diagnosis not present

## 2021-11-02 DIAGNOSIS — Z803 Family history of malignant neoplasm of breast: Secondary | ICD-10-CM

## 2021-11-02 DIAGNOSIS — J439 Emphysema, unspecified: Secondary | ICD-10-CM | POA: Diagnosis present

## 2021-11-02 LAB — I-STAT VENOUS BLOOD GAS, ED
Acid-base deficit: 1 mmol/L (ref 0.0–2.0)
Acid-base deficit: 2 mmol/L (ref 0.0–2.0)
Bicarbonate: 23.9 mmol/L (ref 20.0–28.0)
Bicarbonate: 24.3 mmol/L (ref 20.0–28.0)
Calcium, Ion: 1.07 mmol/L — ABNORMAL LOW (ref 1.15–1.40)
Calcium, Ion: 1.02 mmol/L — ABNORMAL LOW (ref 1.15–1.40)
HCT: 36 % (ref 36.0–46.0)
HCT: 38 % (ref 36.0–46.0)
Hemoglobin: 12.2 g/dL (ref 12.0–15.0)
Hemoglobin: 12.9 g/dL (ref 12.0–15.0)
O2 Saturation: 100 %
O2 Saturation: 93 %
Potassium: 3.9 mmol/L (ref 3.5–5.1)
Potassium: 4.1 mmol/L (ref 3.5–5.1)
Sodium: 136 mmol/L (ref 135–145)
Sodium: 136 mmol/L (ref 135–145)
TCO2: 25 mmol/L (ref 22–32)
TCO2: 26 mmol/L (ref 22–32)
pCO2, Ven: 40 mmHg — ABNORMAL LOW (ref 44–60)
pCO2, Ven: 44.5 mmHg (ref 44–60)
pH, Ven: 7.384 (ref 7.25–7.43)
pH, Ven: 7.345 (ref 7.25–7.43)
pO2, Ven: 171 mmHg — ABNORMAL HIGH (ref 32–45)
pO2, Ven: 70 mmHg — ABNORMAL HIGH (ref 32–45)

## 2021-11-02 LAB — ECHOCARDIOGRAM COMPLETE
AR max vel: 1.9 cm2
AV Area VTI: 2 cm2
AV Area mean vel: 1.81 cm2
AV Mean grad: 3 mmHg
AV Peak grad: 5.9 mmHg
Ao pk vel: 1.21 m/s
Area-P 1/2: 4.21 cm2
Calc EF: 44.8 %
MV VTI: 1.14 cm2
S' Lateral: 3.3 cm
Single Plane A2C EF: 47.7 %
Single Plane A4C EF: 38.7 %
Weight: 1504 oz

## 2021-11-02 LAB — CBC WITH DIFFERENTIAL/PLATELET
Abs Immature Granulocytes: 0.07 10*3/uL (ref 0.00–0.07)
Basophils Absolute: 0 10*3/uL (ref 0.0–0.1)
Basophils Relative: 0 %
Eosinophils Absolute: 0 10*3/uL (ref 0.0–0.5)
Eosinophils Relative: 0 %
HCT: 37.7 % (ref 36.0–46.0)
Hemoglobin: 12.4 g/dL (ref 12.0–15.0)
Immature Granulocytes: 1 %
Lymphocytes Relative: 6 %
Lymphs Abs: 0.7 10*3/uL (ref 0.7–4.0)
MCH: 30.5 pg (ref 26.0–34.0)
MCHC: 32.9 g/dL (ref 30.0–36.0)
MCV: 92.9 fL (ref 80.0–100.0)
Monocytes Absolute: 0.6 10*3/uL (ref 0.1–1.0)
Monocytes Relative: 6 %
Neutro Abs: 10 10*3/uL — ABNORMAL HIGH (ref 1.7–7.7)
Neutrophils Relative %: 87 %
Platelets: 114 10*3/uL — ABNORMAL LOW (ref 150–400)
RBC: 4.06 MIL/uL (ref 3.87–5.11)
RDW: 13.7 % (ref 11.5–15.5)
WBC: 11.5 10*3/uL — ABNORMAL HIGH (ref 4.0–10.5)
nRBC: 0 % (ref 0.0–0.2)

## 2021-11-02 LAB — BASIC METABOLIC PANEL
Anion gap: 11 (ref 5–15)
BUN: 24 mg/dL — ABNORMAL HIGH (ref 8–23)
CO2: 21 mmol/L — ABNORMAL LOW (ref 22–32)
Calcium: 8.3 mg/dL — ABNORMAL LOW (ref 8.9–10.3)
Chloride: 105 mmol/L (ref 98–111)
Creatinine, Ser: 1.02 mg/dL — ABNORMAL HIGH (ref 0.44–1.00)
GFR, Estimated: >60 mL/min (ref >60)
Glucose, Bld: 167 mg/dL — ABNORMAL HIGH (ref 70–99)
Potassium: 4 mmol/L (ref 3.5–5.1)
Sodium: 137 mmol/L (ref 135–145)

## 2021-11-02 LAB — RAPID URINE DRUG SCREEN, HOSP PERFORMED
Amphetamines: NOT DETECTED
Barbiturates: NOT DETECTED
Benzodiazepines: NOT DETECTED
Cocaine: NOT DETECTED
Opiates: NOT DETECTED
Tetrahydrocannabinol: NOT DETECTED

## 2021-11-02 LAB — TROPONIN I (HIGH SENSITIVITY)
Troponin I (High Sensitivity): 791 ng/L (ref <18)
Troponin I (High Sensitivity): 1294 ng/L (ref <18)

## 2021-11-02 LAB — HEMOGLOBIN A1C
Hgb A1c MFr Bld: 5.7 % — ABNORMAL HIGH (ref 4.8–5.6)
Mean Plasma Glucose: 117 mg/dL

## 2021-11-02 LAB — BRAIN NATRIURETIC PEPTIDE: B Natriuretic Peptide: 2444.1 pg/mL — ABNORMAL HIGH (ref 0.0–100.0)

## 2021-11-02 LAB — LIPID PANEL
Cholesterol: 168 mg/dL (ref 0–200)
HDL: 99 mg/dL (ref >40)
LDL Cholesterol: 66 mg/dL (ref 0–99)
Total CHOL/HDL Ratio: 1.7 RATIO
Triglycerides: 15 mg/dL (ref <150)
VLDL: 3 mg/dL (ref 0–40)

## 2021-11-02 LAB — HEPARIN LEVEL (UNFRACTIONATED): Heparin Unfractionated: <0.1 IU/mL — ABNORMAL LOW (ref 0.30–0.70)

## 2021-11-02 LAB — RESP PANEL BY RT-PCR (FLU A&B, COVID) ARPGX2
Influenza A by PCR: NEGATIVE
Influenza B by PCR: NEGATIVE
SARS Coronavirus 2 by RT PCR: NEGATIVE

## 2021-11-02 LAB — HIV ANTIBODY (ROUTINE TESTING W REFLEX): HIV Screen 4th Generation wRfx: NONREACTIVE

## 2021-11-02 LAB — TSH: TSH: 2.025 u[IU]/mL (ref 0.350–4.500)

## 2021-11-02 MED ORDER — CARVEDILOL 3.125 MG PO TABS
3.1250 mg | ORAL_TABLET | Freq: Two times a day (BID) | ORAL | Status: DC
  Administered 2021-11-02 – 2021-11-03 (×2): 3.125 mg via ORAL
  Filled 2021-11-02 (×2): qty 1

## 2021-11-02 MED ORDER — IOHEXOL 350 MG/ML SOLN
80.0000 mL | Freq: Once | INTRAVENOUS | Status: AC | PRN
Start: 2021-11-02 — End: 2021-11-02
  Administered 2021-11-02: 80 mL via INTRAVENOUS

## 2021-11-02 MED ORDER — SODIUM CHLORIDE 0.9% FLUSH
3.0000 mL | Freq: Two times a day (BID) | INTRAVENOUS | Status: DC
  Administered 2021-11-03 – 2021-11-05 (×5): 3 mL via INTRAVENOUS

## 2021-11-02 MED ORDER — FUROSEMIDE 10 MG/ML IJ SOLN
20.0000 mg | Freq: Once | INTRAMUSCULAR | Status: AC
  Administered 2021-11-02: 20 mg via INTRAVENOUS
  Filled 2021-11-02: qty 2

## 2021-11-02 MED ORDER — HEPARIN BOLUS VIA INFUSION
1000.0000 [IU] | Freq: Once | INTRAVENOUS | Status: AC
  Administered 2021-11-02: 1000 [IU] via INTRAVENOUS
  Filled 2021-11-02: qty 1000

## 2021-11-02 MED ORDER — IPRATROPIUM-ALBUTEROL 0.5-2.5 (3) MG/3ML IN SOLN
3.0000 mL | Freq: Four times a day (QID) | RESPIRATORY_TRACT | Status: DC | PRN
  Administered 2021-11-02: 3 mL via RESPIRATORY_TRACT

## 2021-11-02 MED ORDER — SODIUM CHLORIDE 0.9 % IV SOLN: 250.0000 mL | INTRAVENOUS | Status: DC | PRN

## 2021-11-02 MED ORDER — ASPIRIN 81 MG PO CHEW
324.0000 mg | CHEWABLE_TABLET | Freq: Once | ORAL | Status: AC
  Administered 2021-11-02: 324 mg via ORAL
  Filled 2021-11-02: qty 4

## 2021-11-02 MED ORDER — LAMOTRIGINE 100 MG PO TABS
100.0000 mg | ORAL_TABLET | Freq: Two times a day (BID) | ORAL | Status: DC
  Administered 2021-11-02 – 2021-11-06 (×9): 100 mg via ORAL
  Filled 2021-11-02 (×11): qty 1

## 2021-11-02 MED ORDER — ATORVASTATIN CALCIUM 40 MG PO TABS
40.0000 mg | ORAL_TABLET | Freq: Every day | ORAL | Status: DC
  Administered 2021-11-02 – 2021-11-04 (×3): 40 mg via ORAL
  Filled 2021-11-02 (×3): qty 1

## 2021-11-02 MED ORDER — MAGNESIUM SULFATE 2 GM/50ML IV SOLN
2.0000 g | Freq: Once | INTRAVENOUS | Status: AC
  Administered 2021-11-02: 2 g via INTRAVENOUS
  Filled 2021-11-02: qty 50

## 2021-11-02 MED ORDER — HEPARIN (PORCINE) 25000 UT/250ML-% IV SOLN
950.0000 [IU]/h | INTRAVENOUS | Status: DC
  Administered 2021-11-02: 500 [IU]/h via INTRAVENOUS
  Administered 2021-11-03: 650 [IU]/h via INTRAVENOUS
  Administered 2021-11-05: 950 [IU]/h via INTRAVENOUS
  Filled 2021-11-02 (×3): qty 250

## 2021-11-02 MED ORDER — ASPIRIN EC 81 MG PO TBEC
81.0000 mg | DELAYED_RELEASE_TABLET | Freq: Every day | ORAL | Status: DC
  Administered 2021-11-03 – 2021-11-06 (×3): 81 mg via ORAL
  Filled 2021-11-02 (×4): qty 1

## 2021-11-02 MED ORDER — FUROSEMIDE 10 MG/ML IJ SOLN
40.0000 mg | Freq: Every day | INTRAMUSCULAR | Status: DC
  Administered 2021-11-03 – 2021-11-05 (×3): 40 mg via INTRAVENOUS
  Filled 2021-11-02 (×3): qty 4

## 2021-11-02 MED ORDER — LACTATED RINGERS IV BOLUS
1000.0000 mL | Freq: Once | INTRAVENOUS | Status: AC
  Administered 2021-11-02: 1000 mL via INTRAVENOUS

## 2021-11-02 MED ORDER — MORPHINE SULFATE (PF) 2 MG/ML IV SOLN
1.0000 mg | Freq: Once | INTRAVENOUS | Status: AC
  Administered 2021-11-02: 1 mg via INTRAVENOUS
  Filled 2021-11-02: qty 1

## 2021-11-02 MED ORDER — ACETAMINOPHEN 325 MG PO TABS
650.0000 mg | ORAL_TABLET | ORAL | Status: DC | PRN
  Administered 2021-11-03 – 2021-11-04 (×2): 650 mg via ORAL
  Filled 2021-11-02 (×2): qty 2

## 2021-11-02 MED ORDER — HEPARIN BOLUS VIA INFUSION
2000.0000 [IU] | Freq: Once | INTRAVENOUS | Status: AC
  Administered 2021-11-02: 2000 [IU] via INTRAVENOUS
  Filled 2021-11-02: qty 2000

## 2021-11-02 MED ORDER — IPRATROPIUM-ALBUTEROL 0.5-2.5 (3) MG/3ML IN SOLN
3.0000 mL | Freq: Once | RESPIRATORY_TRACT | Status: AC
  Administered 2021-11-02: 3 mL via RESPIRATORY_TRACT
  Filled 2021-11-02: qty 3

## 2021-11-02 MED ORDER — SODIUM CHLORIDE 0.9% FLUSH: 3.0000 mL | INTRAVENOUS | Status: DC | PRN

## 2021-11-02 MED ORDER — NITROGLYCERIN 0.4 MG SL SUBL: 0.4000 mg | SUBLINGUAL_TABLET | SUBLINGUAL | Status: DC | PRN

## 2021-11-02 MED ORDER — ONDANSETRON HCL 4 MG/2ML IJ SOLN: 4.0000 mg | Freq: Four times a day (QID) | INTRAMUSCULAR | Status: DC | PRN

## 2021-11-02 MED ORDER — ALBUTEROL SULFATE (2.5 MG/3ML) 0.083% IN NEBU
10.0000 mg/h | INHALATION_SOLUTION | Freq: Once | RESPIRATORY_TRACT | Status: AC
  Administered 2021-11-02: 10 mg/h via RESPIRATORY_TRACT
  Filled 2021-11-02: qty 3

## 2021-11-02 MED ORDER — LACTATED RINGERS IV BOLUS: 500.0000 mL | Freq: Once | INTRAVENOUS | Status: DC

## 2021-11-02 MED ORDER — IPRATROPIUM-ALBUTEROL 0.5-2.5 (3) MG/3ML IN SOLN
RESPIRATORY_TRACT | Status: AC
  Filled 2021-11-02: qty 3

## 2021-11-02 MED ORDER — NICOTINE 14 MG/24HR TD PT24
14.0000 mg | MEDICATED_PATCH | Freq: Every day | TRANSDERMAL | Status: DC
  Administered 2021-11-02 – 2021-11-06 (×5): 14 mg via TRANSDERMAL
  Filled 2021-11-02 (×6): qty 1

## 2021-11-02 NOTE — Assessment & Plan Note (Addendum)
Continue with lamotrigine, no active seizures.  

## 2021-11-02 NOTE — Assessment & Plan Note (Addendum)
No signs of COPD exacerbation.  ?Steroids have been discontinued. ? ?Patient will be discharged on as needed bronchodilator therapy. ?Check ambulatory oxymetry on room air.  ?Smoking cessation. ?Will need outpatient follow up with pulmonary function testing.    ? ?Positive pulmonary hypertension with mean PA pressure 24 mmHg.  ?

## 2021-11-02 NOTE — Progress Notes (Addendum)
ANTICOAGULATION CONSULT NOTE - Follow Up Consult ? ?Pharmacy Consult for IV heparin ?Indication: chest pain/ACS ? ?Allergies  ?Allergen Reactions  ? Azithromycin Nausea And Vomiting  ? Erythromycin   ?  Sensitivity (GI)  ? Percocet [Oxycodone-Acetaminophen]   ? Penicillins Itching and Rash  ? ? ?Patient Measurements: ?Weight: 42.6 kg (94 lb) ?Heparin Dosing Weight: 42.6 kg ? ?Vital Signs: ?Temp: 98.9 ?F (37.2 ?C) (04/03 0439) ?Temp Source: Oral (04/03 0439) ?BP: 154/71 (04/03 0930) ?Pulse Rate: 102 (04/03 0930) ? ?Labs: ?Recent Labs  ?  11/02/21 ?0431 11/02/21 ?6837 11/02/21 ?2902 11/02/21 ?1115 11/02/21 ?5208  ?HGB  --  12.2   < > 12.4 12.9  ?HCT  --  36.0  --  37.7 38.0  ?PLT  --   --   --  114*  --   ?CREATININE 1.02*  --   --   --   --   ?TROPONINIHS 791*  --   --   --   --   ? < > = values in this interval not displayed.  ? ? ?Estimated Creatinine Clearance: 36 mL/min (A) (by C-G formula based on SCr of 1.02 mg/dL (H)). ? ? ?Medications:  ?Infusions:  ? ?Assessment: ?66 yo F presenting with worsening SOA, chest pain and lower extremity swelling. IV heparin for ACS. Cardiology consulted ? ?H/H 12.4/37.7, plt 114 ? ? ?Goal of Therapy:  ?Heparin level 0.3-0.7 units/ml ?Monitor platelets by anticoagulation protocol: Yes ?  ?Plan:  ?Heparin 2000 units bolus and heparin drip at 500 units/hr ?Heparin level at 1600 ?Daily heparin level and CBC ordered ?Monitor for signs/symptoms of bleed ? ?Thank you for allowing pharmacy to be a part of this patient?s care. ? ?Donnald Garre, PharmD ?Clinical Pharmacist ? ?Please check [facility] for all [facility] numbers ?After 10:00 PM, call [facility] [phone] ? ? ?

## 2021-11-02 NOTE — ED Notes (Signed)
Pt up to [facility] and became extremely short of breath, RN retrieved EDP to reassess. Stopped IVF bolus at this time. See MAR.  ?

## 2021-11-02 NOTE — ED Notes (Signed)
Respiratory called due to increase in work of breathing, pt placed back on bipap by respiratory  ?

## 2021-11-02 NOTE — ED Provider Notes (Signed)
Care of patient handed off to me by Sponseller PA-C.  Please see their note for work-up to this point.  Briefly this is a 66 year old female who presents to the emergency department via EMS for shortness of breath requiring CPAP in route.  Also requiring 2 g of mag, 125 mg of Solu-Medrol, 10 mg albuterol, 5 mg Atrovent with EMS.  No history of COPD documented or heart failure.  She had endorsed some increasing lower extremity swelling.  She is not seen by cardiologist.  ?Physical Exam  ?BP (!) 151/119   Pulse 94   Temp 98.9 ?F (37.2 ?C) (Oral)   Resp 16   SpO2 100%  ? ?Physical Exam ?Vitals and nursing note reviewed.  ?Constitutional:   ?   General: She is in acute distress.  ?   Appearance: She is ill-appearing.  ?HENT:  ?   Head: Normocephalic and atraumatic.  ?   Nose: Nose normal.  ?   Mouth/Throat:  ?   Mouth: Mucous membranes are dry.  ?   Pharynx: Oropharynx is clear.  ?Eyes:  ?   General: No scleral icterus. ?   Extraocular Movements: Extraocular movements intact.  ?Cardiovascular:  ?   Rate and Rhythm: Regular rhythm. Tachycardia present.  ?   Pulses: Normal pulses.  ?   Heart sounds: No murmur heard. ?Pulmonary:  ?   Effort: Tachypnea, accessory muscle usage and respiratory distress present.  ?   Breath sounds: Decreased air movement present. Examination of the right-upper field reveals wheezing. Examination of the left-upper field reveals wheezing. Examination of the right-middle field reveals wheezing. Examination of the left-middle field reveals wheezing. Examination of the right-lower field reveals wheezing. Examination of the left-lower field reveals wheezing. Wheezing present.  ?Chest:  ?   Chest wall: No tenderness.  ?Abdominal:  ?   Palpations: Abdomen is soft.  ?Musculoskeletal:     ?   General: Normal range of motion.  ?   Cervical back: Neck supple.  ?Skin: ?   General: Skin is warm and dry.  ?   Capillary Refill: Capillary refill takes less than 2 seconds.  ?Neurological:  ?   General: No  focal deficit present.  ?   Mental Status: She is alert and oriented to person, place, and time. Mental status is at baseline.  ?Psychiatric:     ?   Mood and Affect: Mood is anxious.  ? ? ?Procedures  ?Mar[facility] KitchenCritical Care ?Performed by: Mickie Hillier, PA-C ?Authorized by: Mickie Hillier, PA-C  ? ?Critical care provider statement:  ?  Critical care time (minutes):  35 ?  Critical care time was exclusive of:  Separately billable procedures and treating other patients ?  Critical care was necessary to treat or prevent imminent or life-threatening deterioration of the following conditions:  Respiratory failure ?  Critical care was time spent personally by me on the following activities:  Development of treatment plan with patient or surrogate, discussions with consultants, discussions with primary provider, evaluation of patient's response to treatment, examination of patient, interpretation of cardiac output measurements, obtaining history from patient or surrogate, review of old charts, re-evaluation of patient's condition, pulse oximetry, ordering and review of radiographic studies, ordering and review of laboratory studies and ordering and performing treatments and interventions ?  I assumed direction of critical care for this patient from another provider in my specialty: yes   ?  Care discussed with: admitting provider   ? ?ED Course / MDM  ? ?Clinical Course  as of 11/02/21 0659  ?Mon Nov 02, 2021  ?0555 Patient attended ambulate to the bathroom independently and desaturated to 60% on room air.  Was able to improve back to normal saturation on 4 L by nasal cannula. [RS]  ?  ?Clinical Course User Index ?[RS] Sponseller, Gypsy Balsam, PA-C  ? ?Medical Decision Making ?Amount and/or Complexity of Data Reviewed ?Labs: ordered. ?Radiology: ordered. ? ?Risk ?OTC drugs. ?Prescription drug management. ?Decision regarding hospitalization. ? ?After handoff, patient ambulated to the bedside commode.  She desatted into the 70s.   When she got back into bed she was in moderate to severe respiratory distress, heart rate in the 130s, diffusely wheezing and tachypneic.  It appears that the patient received a liter of fluid prior to handoff.  Question whether she is with flash pulmonary edema versus COPD exacerbation versus acute heart failure.  Her BNP is 2400.  This could possibly be from pulmonary edema and pulmonary hypertension although she endorses recent history of lower extremity swelling. ?-Placed an order for 20 mg of Lasix in case IV fluids were overloading her. ?I was again called to the bedside for respiratory distress.  She is satting 100% while using an inhaler currently.  Given her diffuse wheezing, will place her on BiPAP.  I called respiratory to bedside to place her on BiPAP.  Also will give her another 2 g of magnesium and continuous albuterol treatment and reassess. ? ?Troponin resulted 791 ?Spoke with Margie Billet, NP with cardiology who will evaluate the patient for NSTEMI. I have already started patietn on 324 ASA and heparin.  ?Obtained CTA PE study to r/o given elevated BNP, trop, with resp distress. This was negative. ? ?Repeat troponin 1,294. Cardiology aware.  ?1024: Cardiology NP at bedside. States they will consult with patient but does not need emergent cath today.  ?11:45: spoke with hospitalist, Dr. Lorin Mercy, who agrees to admit patient for NSTEMI.  ? ? ? ? ? ?  ?Mickie Hillier, PA-C ?11/02/21 1157 ? ?  ?Regan Lemming, MD ?11/02/21 1331 ? ?  ?Regan Lemming, MD ?11/02/21 1332 ? ?

## 2021-11-02 NOTE — ED Notes (Signed)
Pt hypertensive, tachycardic. RT placed pt on bipap. At this time HR and work of breathing improving. RN monitoring closely. ?

## 2021-11-02 NOTE — H&P (Signed)
?History and Physical  ? ? ?Patient: Christie Moreno HKV:[id number] DOB: 1956-06-28 ?DOA: 11/02/2021 ?DOS: the patient was seen and examined on 11/02/2021 ?PCP: Pcp, No - has an appointment later this month with a new PCP ?Patient coming from: Home - lives with Janace Hoard, fiance; NOK: Domingo Mend, [phone] ? ? ?Chief Complaint: SOB ? ?HPI: Christie Moreno is a 66 y.o. female with medical history significant of seizure d/o presenting with SOB.  For the last couple of weeks, she has had SOB - mostly sporadic.  This AM, about 0300, she woke up and was unable to catch her breath at all.  She notices worsening SOB at night recently, and it woke her up overnight.  She had an old inhaler from an episode of bronchitis and had also been given a steroid from urgent care Saturday for cough/SOB thought to be related to COPD.  Nothing was helping, including the steroid and inhaler.  No CP.  No orthopnea - she lies flat to sleep.  She has had 3 days of LE edema. ? ? ? ?ER Course:  Respiratory distress, NSTEMI.  Woke up with SOB, no h/o COPD but likely has it.  CPAP with EMS.  In ED, went to bathroom and 60s on RA.  BNP 2400, troponin 700 -> 1200.  ASA, Heparin, cards consulted, requests [facility] admission. ? ? ? ? ?Review of Systems: As mentioned in the history of present illness. All other systems reviewed and are negative. ?Past Medical History:  ?Diagnosis Date  ? Seizures ([place])   ? none in years, recently had medication change  ? ?History reviewed. No pertinent surgical history. ?Social History:  reports that she has been smoking cigarettes. She has a 52.00 pack-year smoking history. She has never used smokeless tobacco. She reports current alcohol use. She reports that she does not use drugs. ? ?Allergies  ?Allergen Reactions  ? Azithromycin Nausea And Vomiting  ? Erythromycin   ?  Sensitivity (GI)  ? Percocet [Oxycodone-Acetaminophen]   ? Penicillins Itching and Rash  ? ? ?Family History  ?Problem Relation Age of Onset  ?  Diabetes Mother   ? Cancer Mother   ?     breast  ? Stroke Mother   ? Heart Problems Father   ? Diabetes Sister   ? ? ?Prior to Admission medications   ?Medication Sig Start Date End Date Taking? Authorizing Provider  ?estrogens, conjugated, (PREMARIN) 1.25 MG tablet Take 1.25 mg by mouth daily.   Yes [provider]  ?hydrOXYzine (ATARAX/VISTARIL) 25 MG tablet Take 0.5 tablets (12.5 mg total) by mouth every 8 (eight) hours as needed for itching. 02/11/21  Yes Jaynee Eagles, PA-C  ?lamoTRIgine (LAMICTAL) 100 MG tablet Take 1 tablet (100 mg total) by mouth 2 (two) times daily. 08/10/21  Yes Marcial Pacas, MD  ?naproxen sodium (ANAPROX) 220 MG tablet Take 220 mg by mouth 2 (two) times daily as needed (headache).   Yes [provider]  ?predniSONE (DELTASONE) 20 MG tablet Take 2 tablets (40 mg total) by mouth daily with breakfast for 5 days. ?Patient taking differently: Take 40 mg by mouth See admin instructions. Qd x 5 days 10/30/21 11/04/21 Yes Francene Finders, PA-C  ?progesterone (PROMETRIUM) 200 MG capsule Take 200 mg by mouth at bedtime.   Yes [provider]  ?lamoTRIgine (LAMICTAL) 25 MG tablet 1 tab bid x one week ?2 tab bid x 2nd week ?3 tab bid x 3rd week ?Patient not taking: Reported on 11/02/2021 08/10/21  Marcial Pacas, MD  ?topiramate (TOPAMAX) 100 MG tablet Take 1.5 tablet in the morning, taking 2 tablet at bedtime ?Patient not taking: Reported on 11/02/2021 02/03/21   Suzzanne Cloud, NP  ? ? ?Physical Exam: ?Vitals:  ? 11/02/21 1100 11/02/21 1115 11/02/21 1140 11/02/21 1145  ?BP: (!) 148/61 140/65  (!) 148/64  ?Pulse: 92 88  81  ?Resp: '19 16  18  '$ ?Temp:      ?TempSrc:      ?SpO2: 100% 100% 99% 100%  ?Weight:      ? ?General:  Appears calm and comfortable and is in NAD, appears older than stated age, cachectic ?Eyes:  PERRL, EOMI, normal lids, iris, L medial conjunctival hemorrhage ?ENT:  grossly normal hearing, lips & tongue, mmm ?Neck:  no LAD, masses or thyromegaly ?Cardiovascular:  RRR, no  m/r/g. 3+ pitting LE edema.  ?Respiratory:   Scattered rhonchi but mild bibasilar crackles.  Normal respiratory effort. ?Abdomen:  soft, NT, ND ?Skin:  scattered purpura on arms, R > L ?Musculoskeletal:  grossly normal tone BUE/BLE, good ROM, no bony abnormality ?Psychiatric:  grossly normal mood and affect, speech fluent and appropriate, AOx3, ?mild cognitive impairment ?Neurologic:  CN 2-12 grossly intact, moves all extremities in coordinated fashion ? ? ?Radiological Exams on Admission: ?Independently reviewed - see discussion in A/P where applicable ? ?CT Angio Chest PE W and/or Wo Contrast ? ?Result Date: 11/02/2021 ?CLINICAL DATA:  High probability of pulmonary embolism. EXAM: CT ANGIOGRAPHY CHEST WITH CONTRAST TECHNIQUE: Multidetector CT imaging of the chest was performed using the standard protocol during bolus administration of intravenous contrast. Multiplanar CT image reconstructions and MIPs were obtained to evaluate the vascular anatomy. RADIATION DOSE REDUCTION: This exam was performed according to the departmental dose-optimization program which includes automated exposure control, adjustment of the mA and/or kV according to patient size and/or use of iterative reconstruction technique. CONTRAST:  44m OMNIPAQUE IOHEXOL 350 MG/ML SOLN COMPARISON:  None. FINDINGS: Cardiovascular: Satisfactory opacification of the pulmonary arteries to the segmental level. No evidence of pulmonary embolism. Normal heart size. No pericardial effusion. Atherosclerosis of thoracic aorta is noted without aneurysm formation. Mediastinum/Nodes: No enlarged mediastinal, hilar, or axillary lymph nodes. Thyroid gland, trachea, and esophagus demonstrate no significant findings. Lungs/Pleura: No pneumothorax is noted. Minimal bilateral pleural effusions are noted with adjacent subsegmental atelectasis. Emphysematous disease is noted bilaterally. Upper Abdomen: No acute abnormality. Musculoskeletal: No chest wall abnormality. No  acute or significant osseous findings. Review of the MIP images confirms the above findings. IMPRESSION: No definite evidence of pulmonary embolus. Minimal bilateral pleural effusions are noted with adjacent subsegmental atelectasis. Aortic Atherosclerosis (ICD10-I70.0) and Emphysema (ICD10-J43.9). Electronically Signed   By: JMarijo ConceptionM.D.   On: 11/02/2021 09:40  ? ?DG Chest Portable 1 View ? ?Result Date: 11/02/2021 ?CLINICAL DATA:  66year old female with history of shortness of breath. Respiratory distress. EXAM: PORTABLE CHEST 1 VIEW COMPARISON:  No priors. FINDINGS: Lung volumes are increased with emphysematous changes. No acute consolidative airspace disease. No pleural effusions. No pneumothorax. No evidence of pulmonary edema. No definite suspicious appearing pulmonary nodules or masses are noted. Heart size is normal. Upper mediastinal contours are within normal limits. Atherosclerotic calcifications are noted in the thoracic aorta. IMPRESSION: 1. No radiographic evidence of acute cardiopulmonary disease. 2. Emphysema. 3. Aortic atherosclerosis. Electronically Signed   By: DVinnie LangtonM.D.   On: 11/02/2021 05:01   ? ?EKG: Independently reviewed.   ?06295- Sinus tachycardia with rate 104; prolonged QTc 515; nonspecific ST  changes with no evidence of acute ischemia ?0845 - NSR with rate 98; prolonged QTc 561; nonspecific ST changes with no evidence of acute ischemia ? ? ?Labs on Admission: I have personally reviewed the available labs and imaging studies at the time of the admission. ? ?Pertinent labs:   ? ?VGB: 7.384/40/23.9 -> 7.345/44.5/24.3 ?Glucose 176 ?BUN 24/Creatinine 1.02/GFR >60 ?HS troponin 791, 1294 ?BNP 2444.1 ?WBC 11.5 ?Platelets 114 ?COVID/flu negative ? ? ? ?Assessment and Plan: ?* NSTEMI (non-ST elevated myocardial infarction) ([place]) ?-Patient with several weeks of worsening SOB, acutely worse overnight ?-She was started on CPAP with EMS, weaned off, became hypoxic to the 60s with  ambulation, and started on BIPAP; she is currently on [place] O2 ?-Denies CP ?-CXR and chest CT unremarkable other than COPD   ?-Initial HS troponin markedly elevated with positive delta   ?-EKG with nonspecific ST chang

## 2021-11-02 NOTE — Assessment & Plan Note (Addendum)
Patient was admitted to the progressive care units. ?She was placed on IV heparin for anticoagulation.  ? ?High sensitive troponin peak at 1,294.  ? ?Echocardiogram with mild reduction in EF to 40 to 45%, apical hypokinesis, with remaining walls hypokinetic.  ? ?Cardiac catheterization with non obstructive coronary artery disease. ?She has been placed on medical therapy including B blockade, ARB, aspirin and high dose high potency statin. ?Follow up as outpatient.  ?Smoking cessation counseling.  ? ?

## 2021-11-02 NOTE — ED Notes (Signed)
Pt transitioned to 2L [place]. No distress noted at this time. Pt speaking in full sentences. Pt reports that she was started on prednisone on Friday for bronchitis.  ?

## 2021-11-02 NOTE — ED Notes (Signed)
Pt HR 96 and work of breathing has improved, RT initiated continuous breathing treatment.  ?

## 2021-11-02 NOTE — Consult Note (Addendum)
?Cardiology Consultation:  ? ?Patient ID: Christie Moreno ?MRN: [id number]; DOB: 1956/05/10 ? ?Admit date: 11/02/2021 ?Date of Consult: 11/02/2021 ? ?PCP:  Pcp, No ?  ?[facility] Providers ?Cardiologist:  New to Dr Gwenlyn Found    { ? ? ?Patient Profile:  ? ?Christie Moreno is a 66 y.o. female with a hx of seizure, chronic headache,  chronic hormone therapy, who is being seen 11/02/2021 for the evaluation of NSTEMI at the request of [name], PA ? ?History of Present Illness:  ? ?Christie Moreno with above PMH presented to ER with SOB today. She has no cardiologist or PCP prior to now. She is overall a poor historian. She states she started feeling SOB 2-3 weeks ago. She feels dyspneic mostly at the end of day, particularly when she lay down at night or with exertions. She thought she was having bronchitis, endorses a productive cough. She noted her legs and feet are swelling 3 days ago, denied any weight gain. She denied any chest pain, states she has been having some intermittent heart flutter sensation with the dyspneic episodes. This morning she was feeling significantly SOB with near syncope feeling, therefore came to ER. She denied any known cardiac disease such as MI, CHF, arrhythmia. She states she was told she had murmur in the past. She smokes daily with average 4-5 cigarettes daily since she was 66 years old. She was diagnosed with COPD, lost to follow up with PCP for a year and is currently not on any medication for her blood pressure or COPD. She believes she has thyroid disease as well. She is on estrogen therapy for menopause symptoms. She denied hx of DM or CVA, report her mother had stroke and some relative of her mother side had MI.  ? ?Per ED workup, initial VBG with mild hypercarbia, CBC with leukocytosis WBC 11.5k, BMP grossly unremarkable. Trop 791 >1294. BNP 2444. EKG with ST 104 bpm, non-specific T wave abnormalities of inferior lateral leads. CXR with no acute finding but emphysema. CTA showed no  PE, Minimal bilateral pleural effusions are noted with adjacent subsegmental atelectasis, and Aortic Atherosclerosis. She was given albuterol, ASA '324mg'$ , lasix '20mg'$ , Duoneb, LR bolus 1L, Mag 2g, and started on heparin gtt at ED. Cardiology is consulted for further input.  ? ?  ? ?Past Medical History:  ?Diagnosis Date  ? Seizures ([place])   ? ? ?No past surgical history on file.  ? ?Home Medications:  ?Prior to Admission medications   ?Medication Sig Start Date End Date Taking? Authorizing Provider  ?estrogens, conjugated, (PREMARIN) 1.25 MG tablet Take 1.25 mg by mouth daily.   Yes [provider]  ?hydrOXYzine (ATARAX/VISTARIL) 25 MG tablet Take 0.5 tablets (12.5 mg total) by mouth every 8 (eight) hours as needed for itching. 02/11/21  Yes Jaynee Eagles, PA-C  ?lamoTRIgine (LAMICTAL) 100 MG tablet Take 1 tablet (100 mg total) by mouth 2 (two) times daily. 08/10/21  Yes Marcial Pacas, MD  ?naproxen sodium (ANAPROX) 220 MG tablet Take 220 mg by mouth 2 (two) times daily as needed (headache).   Yes [provider]  ?predniSONE (DELTASONE) 20 MG tablet Take 2 tablets (40 mg total) by mouth daily with breakfast for 5 days. ?Patient taking differently: Take 40 mg by mouth See admin instructions. Qd x 5 days 10/30/21 11/04/21 Yes Francene Finders, PA-C  ?progesterone (PROMETRIUM) 200 MG capsule Take 200 mg by mouth at bedtime.   Yes [provider]  ?lamoTRIgine (LAMICTAL) 25 MG tablet 1  tab bid x one week ?2 tab bid x 2nd week ?3 tab bid x 3rd week ?Patient not taking: Reported on 11/02/2021 08/10/21   Marcial Pacas, MD  ?topiramate (TOPAMAX) 100 MG tablet Take 1.5 tablet in the morning, taking 2 tablet at bedtime ?Patient not taking: Reported on 11/02/2021 02/03/21   Suzzanne Cloud, NP  ? ? ?Inpatient Medications: ?Scheduled Meds: ? [START ON 11/03/2021] aspirin EC  81 mg Oral Daily  ? carvedilol  3.125 mg Oral BID WC  ? furosemide  20 mg Intravenous Once  ? [START ON 11/03/2021] furosemide  40 mg Intravenous Daily   ? ? ?Continuous Infusions: ? heparin 500 Units/hr (11/02/21 1029)  ? ?PRN Meds: ? ? ?Allergies:    ?Allergies  ?Allergen Reactions  ? Azithromycin Nausea And Vomiting  ? Erythromycin   ?  Sensitivity (GI)  ? Percocet [Oxycodone-Acetaminophen]   ? Penicillins Itching and Rash  ? ? ?Social History:   ?Social History  ? ?Socioeconomic History  ? Marital status: Widowed  ?  Spouse name: Not on file  ? Number of children: Not on file  ? Years of education: Not on file  ? Highest education level: Not on file  ?Occupational History  ? Not on file  ?Tobacco Use  ? Smoking status: Every Day  ?  Packs/day: 1.00  ?  Types: Cigarettes  ? Smokeless tobacco: Never  ?Substance and Sexual Activity  ? Alcohol use: Yes  ?  Alcohol/week: 0.0 standard drinks  ?  Comment: rare  ? Drug use: No  ? Sexual activity: Not on file  ?Other Topics Concern  ? Not on file  ?Social History Narrative  ? Not on file  ? ?Social Determinants of Health  ? ?Financial Resource Strain: Not on file  ?Food Insecurity: Not on file  ?Transportation Needs: Not on file  ?Physical Activity: Not on file  ?Stress: Not on file  ?Social Connections: Not on file  ?Intimate Partner Violence: Not on file  ?  ?Family History:   ? ?Family History  ?Problem Relation Age of Onset  ? Diabetes Mother   ? Cancer Mother   ?     breast  ? Stroke Mother   ? Heart Problems Father   ? Diabetes Sister   ?  ? ?ROS:  ?Constitutional: Denied fever, chills, malaise, night sweats ?Eyes: Denied vision change or loss ?Ears/Nose/Mouth/Throat: Denied ear ache, sore throat, coughing, sinus pain ?Cardiovascular: see HPI  ?Respiratory: see HPI ?Gastrointestinal: Denied nausea, vomiting, abdominal pain, diarrhea ?Genital/Urinary:reports urinary frequency/urgency ?Musculoskeletal: Denied muscle ache, joint pain, weakness ?Skin: Denied rash, wound ?Neuro: seizure, chronic headache  ?Psych: Denied history of depression/anxiety  ?Endocrine: Denied history of diabetes ?    ? ?Physical Exam/Data:   ? ?Vitals:  ? 11/02/21 0936 11/02/21 1000 11/02/21 1030 11/02/21 1100  ?BP:  (!) 156/69 (!) 161/70 (!) 148/61  ?Pulse:  90 94 92  ?Resp:  (!) '21 18 19  '$ ?Temp:      ?TempSrc:      ?SpO2:  100% 100% 100%  ?Weight: 42.6 kg     ? ? ?Intake/Output Summary (Last 24 hours) at 11/02/2021 1113 ?Last data filed at 11/02/2021 2458 ?Gross per 24 hour  ?Intake 430.56 ml  ?Output --  ?Net 430.56 ml  ? ? ?  11/02/2021  ?  9:36 AM 08/10/2021  ?  3:00 PM 02/03/2021  ?  2:46 PM  ?Last 3 Weights  ?Weight (lbs) 94 lb 89 lb 89 lb  ?Weight (  kg) 42.638 kg 40.37 kg 40.37 kg  ?   ?Body mass index is 17.19 kg/m?.  ? ?Vitals:  ?Vitals:  ? 11/02/21 1030 11/02/21 1100  ?BP: (!) 161/70 (!) 148/61  ?Pulse: 94 92  ?Resp: 18 19  ?Temp:    ?SpO2: 100% 100%  ? ?General Appearance: In no apparent distress, laying in bed, cachectic ?HEENT: Normocephalic, atraumatic.  ?Neck: Supple, trachea midline, JVD up to upper neck while laying 30 degree  ?Cardiovascular: Regular rate and rhythm, normal S1-S2,  no murmur/rub/gallop, S3/S4 ?Respiratory: Resting breathing unlabored, + DOE,  lungs sounds clear but faint to auscultation, no use of accessory muscles.  On 2 L nasal cannula oxygen ?Gastrointestinal: Bowel sounds positive, abdomen soft, non-tender ?Extremities: Able to move all extremities in bed without difficulty, 2-3+ pitting edema BLE  ?Genitourinary:  genital exam not performed ?Musculoskeletal: Generalized muscular wasting, muscle strength 5/5 throughout  ?Skin: Intact, warm, dry. + petechiae noted in arms  ?Neurologic: Alert, oriented to person, place and time. Fluent speech, no cognitive deficit, slow with answering questions ?Psychiatric: Normal affect. Mood is appropriate.  ? ? ? ?EKG:  The EKG was personally reviewed and demonstrates:  Sinus tachycardia with VR 104 bpm, non-specific T wave abnormalities of inferior lateal leads,  seems new comparing to 11/2010, ? ?Telemetry:  Telemetry was personally reviewed and demonstrates:  Sinus rhythm/tachycardia  90-110s, occasional PACs ? ?Relevant CV Studies: ?N/A ? ?Laboratory Data: ? ?High Sensitivity Troponin:   ?Recent Labs  ?Lab 11/02/21 ?0431 11/02/21 ?0841  ?TROPONINIHS 791* 1,294*  ?   ?Chemistry ?Recent Labs

## 2021-11-02 NOTE — ED Provider Notes (Signed)
?[facility] ?Provider Note ? ? ?CSN: [id number] ?Arrival date & time: 11/02/21  0425 ? ?  ? ?History ? ?Chief Complaint  ?Patient presents with  ? Respiratory Distress  ? ? ?Christie Moreno is a 66 y.o. female with history of smoking approximately 20 cigarettes every 48 hours who presents with concern for progressively worsening shortness of breath for last 2 weeks.  This evening her partner called EMS  due to his concern that she could not catch her breath.  She was having some chest pain this evening as well when the shortness of breath was most severe.  Patient was seen at the urgent care on 3/31 for this problem and was discharged with prescription for prednisone.  She has had 2 days worth of prednisone without significant improvement in her symptoms.  She also states that she has an inhaler no it is not an albuterol inhaler which she takes as needed when these happen though is from a few years ago.  She does not have any known history of COPD.  She does also endorse some new swelling in her lower extremities.  Dry cough.  No documented history of cardiac work-up in the past.  Patient denies having seen a cardiologist. ? ?In route with EMS patient was administered 2 g of mag, 125 IV Solu-Medrol, 10 mg albuterol, 5 mg of Atrovent, and was placed on CPAP due to significantly increased work of breathing.  Patient was notably hypertensive with EMS as well, tachycardia to the 130s at time of arrival and unable to get a room air saturation due to patient significantly increased work of breathing and presumed hypoxia. ? ?Patient with history of epilepsy on Lamictal daily. ? ?HPI ? ?  ? ?Home Medications ?Prior to Admission medications   ?Medication Sig Start Date End Date Taking? Authorizing Provider  ?estrogens, conjugated, (PREMARIN) 1.25 MG tablet Take 1.25 mg by mouth daily.   Yes [provider]  ?hydrOXYzine (ATARAX/VISTARIL) 25 MG tablet Take 0.5 tablets (12.5 mg  total) by mouth every 8 (eight) hours as needed for itching. 02/11/21  Yes Jaynee Eagles, PA-C  ?lamoTRIgine (LAMICTAL) 100 MG tablet Take 1 tablet (100 mg total) by mouth 2 (two) times daily. 08/10/21  Yes Marcial Pacas, MD  ?naproxen sodium (ANAPROX) 220 MG tablet Take 220 mg by mouth 2 (two) times daily as needed (headache).   Yes [provider]  ?predniSONE (DELTASONE) 20 MG tablet Take 2 tablets (40 mg total) by mouth daily with breakfast for 5 days. ?Patient taking differently: Take 40 mg by mouth See admin instructions. Qd x 5 days 10/30/21 11/04/21 Yes Francene Finders, PA-C  ?progesterone (PROMETRIUM) 200 MG capsule Take 200 mg by mouth at bedtime.   Yes [provider]  ?lamoTRIgine (LAMICTAL) 25 MG tablet 1 tab bid x one week ?2 tab bid x 2nd week ?3 tab bid x 3rd week ?Patient not taking: Reported on 11/02/2021 08/10/21   Marcial Pacas, MD  ?topiramate (TOPAMAX) 100 MG tablet Take 1.5 tablet in the morning, taking 2 tablet at bedtime ?Patient not taking: Reported on 11/02/2021 02/03/21   Suzzanne Cloud, NP  ?   ? ?Allergies    ?Azithromycin, Erythromycin, Percocet [oxycodone-acetaminophen], and Penicillins   ? ?Review of Systems   ?Review of Systems  ?Constitutional: Negative.   ?HENT: Negative.    ?Respiratory:  Positive for cough, chest tightness and shortness of breath.   ?Cardiovascular:  Positive for chest pain. Negative for palpitations  and leg swelling.  ?Gastrointestinal: Negative.   ?Genitourinary: Negative.   ?Musculoskeletal: Negative.   ?Neurological: Negative.   ? ?Physical Exam ?Updated Vital Signs ?BP (!) 151/119   Pulse 94   Temp 98.9 ?F (37.2 ?C) (Oral)   Resp 16   SpO2 100%  ?Physical Exam ?Vitals and nursing note reviewed.  ?Constitutional:   ?   Appearance: She is cachectic. She is not ill-appearing or toxic-appearing.  ?HENT:  ?   Head: Normocephalic and atraumatic.  ?   Nose: Nose normal.  ?   Mouth/Throat:  ?   Mouth: Mucous membranes are moist.  ?   Pharynx: Oropharynx is clear.  Uvula midline. No oropharyngeal exudate or posterior oropharyngeal erythema.  ?   Tonsils: No tonsillar exudate.  ?Eyes:  ?   General: Lids are normal. Vision grossly intact.     ?   Right eye: No discharge.     ?   Left eye: No discharge.  ?   Extraocular Movements: Extraocular movements intact.  ?   Conjunctiva/sclera: Conjunctivae normal.  ?   Pupils: Pupils are equal, round, and reactive to light.  ?Neck:  ?   Trachea: Trachea and phonation normal.  ?Cardiovascular:  ?   Rate and Rhythm: Normal rate and regular rhythm.  ?   Pulses:     ?     Dorsalis pedis pulses are 1+ on the right side and 1+ on the left side.  ?   Heart sounds: Normal heart sounds. No murmur heard. ?   Comments: 2+ pitting edema up to the proximal tibias bilaterally ?Pulmonary:  ?   Effort: Pulmonary effort is normal. No tachypnea, bradypnea, accessory muscle usage, prolonged expiration or respiratory distress.  ?   Breath sounds: Examination of the left-lower field reveals rales. Rales present. No wheezing.  ?   Comments: No increased work of breathing at time of my initial evaluation. ?On 4 L supplemental oxygen by nasal cannula ?Chest:  ?   Chest wall: No mass, lacerations, deformity, swelling, tenderness, crepitus or edema.  ?Abdominal:  ?   General: Bowel sounds are normal. There is no distension.  ?   Palpations: Abdomen is soft.  ?   Tenderness: There is no abdominal tenderness. There is no right CVA tenderness, left CVA tenderness, guarding or rebound.  ?Musculoskeletal:     ?   General: No deformity.  ?   Cervical back: Normal range of motion and neck supple.  ?   Right lower leg: 2+ Pitting Edema present.  ?   Left lower leg: 2+ Pitting Edema present.  ?Lymphadenopathy:  ?   Cervical: No cervical adenopathy.  ?Skin: ?   General: Skin is warm and dry.  ?   Capillary Refill: Capillary refill takes less than 2 seconds.  ?   Findings: No rash.  ?Neurological:  ?   General: No focal deficit present.  ?   Mental Status: She is alert and  oriented to person, place, and time. Mental status is at baseline.  ?   GCS: GCS eye subscore is 4. GCS verbal subscore is 5. GCS motor subscore is 6.  ?   Gait: Gait is intact.  ?Psychiatric:     ?   Mood and Affect: Mood normal.  ? ? ?ED Results / Procedures / Treatments   ?Labs ?(all labs ordered are listed, but only abnormal results are displayed) ?Labs Reviewed  ?BASIC METABOLIC PANEL - Abnormal; Notable for the following components:  ?  Result Value  ? CO2 21 (*)   ? Glucose, Bld 167 (*)   ? BUN 24 (*)   ? Creatinine, Ser 1.02 (*)   ? Calcium 8.3 (*)   ? All other components within normal limits  ?CBC WITH DIFFERENTIAL/PLATELET - Abnormal; Notable for the following components:  ? WBC 11.5 (*)   ? Platelets 114 (*)   ? Neutro Abs 10.0 (*)   ? All other components within normal limits  ?I-STAT VENOUS BLOOD GAS, ED - Abnormal; Notable for the following components:  ? pCO2, Ven 40.0 (*)   ? pO2, Ven 171 (*)   ? Calcium, Ion 1.07 (*)   ? All other components within normal limits  ?RESP PANEL BY RT-PCR (FLU A&B, COVID) [id number]  ?CBC WITH DIFFERENTIAL/PLATELET  ?BRAIN NATRIURETIC PEPTIDE  ?TROPONIN I (HIGH SENSITIVITY)  ? ? ?EKG ?EKG Interpretation ? ?Date/Time:  Monday November 02 2021 04:34:27 EDT ?Ventricular Rate:  104 ?PR Interval:  172 ?QRS Duration: 99 ?QT Interval:  391 ?QTC Calculation: 515 ?R Axis:   103 ?Text Interpretation: Right and left arm electrode reversal, interpretation assumes no reversal Sinus tachycardia Right axis deviation Consider left ventricular hypertrophy Prolonged QT interval When compared with ECG of 11/17/2010, QT has lengthened has changed has increased Confirmed by Delora Fuel ([id number]) on 11/02/2021 6:47:27 AM ? ?Radiology ?DG Chest Portable 1 View ? ?Result Date: 11/02/2021 ?CLINICAL DATA:  66 year old female with history of shortness of breath. Respiratory distress. EXAM: PORTABLE CHEST 1 VIEW COMPARISON:  No priors. FINDINGS: Lung volumes are increased with emphysematous changes. No  acute consolidative airspace disease. No pleural effusions. No pneumothorax. No evidence of pulmonary edema. No definite suspicious appearing pulmonary nodules or masses are noted. Heart size is normal. Upper

## 2021-11-02 NOTE — Assessment & Plan Note (Addendum)
Continue nicotine patch, smoking cessation.  ?

## 2021-11-02 NOTE — ED Triage Notes (Signed)
Pt arrived via [facility] for cc of respiratory distress from home. Pt reported to EMS that she woke up with severe shortness of breath, EMS stated pt was not able to speak in complete sentences. Pt not on oxygen at baseline. Pt reported history of chronic bronchitis. Pt placed on CPAP. Pt A&Ox4, GCS 15. ? ?EMS administered 2g Mag, 125 Solumed via 20 LAC. VIA CPAP EMS administered 10 mg albuterol, Atrovent.  ? ?EMS Vitals ? ?BP 220/150 ?HR 130 ?96% CPAP ?RR 30 ?

## 2021-11-02 NOTE — Progress Notes (Signed)
Patient having SOB and expiratory wheezes bilaterally. RN called RT to assess. RT gave 1 Duoneb at this time with no improvement noted. RT placed patient on BIPAP and informed RN. Pt is tolerating BIPAP well at this time. RT will monitor as needed. ?

## 2021-11-02 NOTE — Assessment & Plan Note (Addendum)
Resolved in follow up QTc ?

## 2021-11-02 NOTE — Progress Notes (Signed)
ANTICOAGULATION CONSULT NOTE - Follow-Up Consult ? ?Pharmacy Consult for Heparin ?Indication: chest pain/ACS ? ?Allergies  ?Allergen Reactions  ? Azithromycin Nausea And Vomiting  ? Erythromycin   ?  Sensitivity (GI)  ? Percocet [Oxycodone-Acetaminophen]   ? Penicillins Itching and Rash  ? ? ?Patient Measurements: ?Weight: 42.6 kg (94 lb) ?Heparin Dosing Weight: 42.6 kg ? ?Vital Signs: ?BP: 146/70 (04/03 1652) ?Pulse Rate: 81 (04/03 1652) ? ?Labs: ?Recent Labs  ?  11/02/21 ?0431 11/02/21 ?1610 11/02/21 ?9604 11/02/21 ?5409 11/02/21 ?8119 11/02/21 ?1478 11/02/21 ?1720  ?HGB  --  12.2   < > 12.4  --  12.9  --   ?HCT  --  36.0  --  37.7  --  38.0  --   ?PLT  --   --   --  114*  --   --   --   ?HEPARINUNFRC  --   --   --   --   --   --  <0.10*  ?CREATININE 1.02*  --   --   --   --   --   --   ?TROPONINIHS 791*  --   --   --  1,294*  --   --   ? < > = values in this interval not displayed.  ? ? ?Estimated Creatinine Clearance: 36 mL/min (A) (by C-G formula based on SCr of 1.02 mg/dL (H)). ? ? ?Medical History: ?Past Medical History:  ?Diagnosis Date  ? Seizures ([place])   ? none in years, recently had medication change  ? ? ?Medications:  ?(Not in a hospital admission) ? ?Scheduled:  ? [START ON 11/03/2021] aspirin EC  81 mg Oral Daily  ? atorvastatin  40 mg Oral q1800  ? carvedilol  3.125 mg Oral BID WC  ? [START ON 11/03/2021] furosemide  40 mg Intravenous Daily  ? lamoTRIgine  100 mg Oral BID  ? nicotine  14 mg Transdermal Daily  ? sodium chloride flush  3 mL Intravenous Q12H  ? ?Infusions:  ? sodium chloride    ? heparin 500 Units/hr (11/02/21 1029)  ? ?PRN: sodium chloride, acetaminophen, nitroGLYCERIN, ondansetron (ZOFRAN) IV, sodium chloride flush ? ?Assessment: ?13 yof with a history of seizure. Patient is presenting with SOA, chest pain and lower extremity swelling. Heparin per pharmacy consult placed for chest pain/ACS. Patient not on anticoagulation prior to arrival. ? ?Heparin infusion started at 500 units/hr  following 2000 unit bolus. Heparin level back at < 0.1. No issues with infusion reported. ? ?Hgb 12.9; plt 114 ?hsTrop 295>6213 ? ?Goal of Therapy:  ?Heparin level 0.3-0.7 units/ml ?Monitor platelets by anticoagulation protocol: Yes ?  ?Plan:  ?Give 1000 units bolus x 1 ?Increase heparin infusion to 650 units/hr ?Check anti-Xa level in 8 hours and daily while on heparin ?Continue to monitor H&H and platelets ? ?Lorelei Pont, PharmD, BCPS ?11/02/2021 5:38 PM ?ED Clinical Pharmacist -  [phone] ? ? ? ?

## 2021-11-03 ENCOUNTER — Inpatient Hospital Stay (HOSPITAL_COMMUNITY): Payer: Medicare Other

## 2021-11-03 ENCOUNTER — Other Ambulatory Visit: Payer: Self-pay

## 2021-11-03 DIAGNOSIS — J9601 Acute respiratory failure with hypoxia: Secondary | ICD-10-CM | POA: Diagnosis present

## 2021-11-03 DIAGNOSIS — R109 Unspecified abdominal pain: Secondary | ICD-10-CM

## 2021-11-03 DIAGNOSIS — J449 Chronic obstructive pulmonary disease, unspecified: Secondary | ICD-10-CM | POA: Diagnosis not present

## 2021-11-03 DIAGNOSIS — I214 Non-ST elevation (NSTEMI) myocardial infarction: Secondary | ICD-10-CM | POA: Diagnosis not present

## 2021-11-03 DIAGNOSIS — I5021 Acute systolic (congestive) heart failure: Secondary | ICD-10-CM | POA: Diagnosis present

## 2021-11-03 DIAGNOSIS — R7989 Other specified abnormal findings of blood chemistry: Secondary | ICD-10-CM | POA: Diagnosis not present

## 2021-11-03 LAB — COMPREHENSIVE METABOLIC PANEL
ALT: 145 U/L — ABNORMAL HIGH (ref 0–44)
AST: 116 U/L — ABNORMAL HIGH (ref 15–41)
Albumin: 2.9 g/dL — ABNORMAL LOW (ref 3.5–5.0)
Alkaline Phosphatase: 71 U/L (ref 38–126)
Anion gap: 8 (ref 5–15)
BUN: 29 mg/dL — ABNORMAL HIGH (ref 8–23)
CO2: 32 mmol/L (ref 22–32)
Calcium: 7.8 mg/dL — ABNORMAL LOW (ref 8.9–10.3)
Chloride: 98 mmol/L (ref 98–111)
Creatinine, Ser: 1.11 mg/dL — ABNORMAL HIGH (ref 0.44–1.00)
GFR, Estimated: 54 mL/min — ABNORMAL LOW (ref >60)
Glucose, Bld: 86 mg/dL (ref 70–99)
Potassium: 3.7 mmol/L (ref 3.5–5.1)
Sodium: 138 mmol/L (ref 135–145)
Total Bilirubin: 0.5 mg/dL (ref 0.3–1.2)
Total Protein: 5.3 g/dL — ABNORMAL LOW (ref 6.5–8.1)

## 2021-11-03 LAB — CBC
HCT: 33.9 % — ABNORMAL LOW (ref 36.0–46.0)
Hemoglobin: 11.2 g/dL — ABNORMAL LOW (ref 12.0–15.0)
MCH: 30.2 pg (ref 26.0–34.0)
MCHC: 33 g/dL (ref 30.0–36.0)
MCV: 91.4 fL (ref 80.0–100.0)
Platelets: 107 10*3/uL — ABNORMAL LOW (ref 150–400)
RBC: 3.71 MIL/uL — ABNORMAL LOW (ref 3.87–5.11)
RDW: 13.6 % (ref 11.5–15.5)
WBC: 8.9 10*3/uL (ref 4.0–10.5)
nRBC: 0 % (ref 0.0–0.2)

## 2021-11-03 LAB — MRSA NEXT GEN BY PCR, NASAL: MRSA by PCR Next Gen: NOT DETECTED

## 2021-11-03 LAB — LIPASE, BLOOD: Lipase: 24 U/L (ref 11–51)

## 2021-11-03 LAB — HEPARIN LEVEL (UNFRACTIONATED)
Heparin Unfractionated: 0.21 IU/mL — ABNORMAL LOW (ref 0.30–0.70)
Heparin Unfractionated: 0.58 IU/mL (ref 0.30–0.70)

## 2021-11-03 MED ORDER — IPRATROPIUM-ALBUTEROL 0.5-2.5 (3) MG/3ML IN SOLN
3.0000 mL | Freq: Four times a day (QID) | RESPIRATORY_TRACT | Status: DC
  Administered 2021-11-03 – 2021-11-04 (×3): 3 mL via RESPIRATORY_TRACT
  Filled 2021-11-03 (×3): qty 3

## 2021-11-03 MED ORDER — BUDESONIDE 0.25 MG/2ML IN SUSP
0.2500 mg | Freq: Two times a day (BID) | RESPIRATORY_TRACT | Status: DC
  Administered 2021-11-03 – 2021-11-06 (×6): 0.25 mg via RESPIRATORY_TRACT
  Filled 2021-11-03 (×7): qty 2

## 2021-11-03 MED ORDER — METHYLPREDNISOLONE SODIUM SUCC 40 MG IJ SOLR
40.0000 mg | Freq: Two times a day (BID) | INTRAMUSCULAR | Status: DC
  Administered 2021-11-03 – 2021-11-04 (×3): 40 mg via INTRAVENOUS
  Filled 2021-11-03 (×3): qty 1

## 2021-11-03 MED ORDER — POTASSIUM CHLORIDE 20 MEQ PO PACK
40.0000 meq | PACK | Freq: Once | ORAL | Status: AC
  Filled 2021-11-03: qty 2

## 2021-11-03 MED ORDER — ALBUTEROL SULFATE (2.5 MG/3ML) 0.083% IN NEBU: 2.5000 mg | INHALATION_SOLUTION | Freq: Four times a day (QID) | RESPIRATORY_TRACT | Status: DC | PRN

## 2021-11-03 MED ORDER — GUAIFENESIN ER 600 MG PO TB12
600.0000 mg | ORAL_TABLET | Freq: Two times a day (BID) | ORAL | Status: DC
  Administered 2021-11-03 – 2021-11-06 (×7): 600 mg via ORAL
  Filled 2021-11-03 (×7): qty 1

## 2021-11-03 MED ORDER — CARVEDILOL 6.25 MG PO TABS
6.2500 mg | ORAL_TABLET | Freq: Two times a day (BID) | ORAL | Status: DC
  Administered 2021-11-03 – 2021-11-06 (×5): 6.25 mg via ORAL
  Filled 2021-11-03 (×6): qty 1

## 2021-11-03 MED ORDER — POTASSIUM CHLORIDE CRYS ER 20 MEQ PO TBCR
40.0000 meq | EXTENDED_RELEASE_TABLET | Freq: Once | ORAL | Status: AC
  Administered 2021-11-03: 40 meq via ORAL
  Filled 2021-11-03: qty 2

## 2021-11-03 NOTE — Progress Notes (Signed)
?  Progress Note ? ? ?Patient: Christie Moreno FKC:[id number] DOB: May 05, 1956 DOA: 11/02/2021     1 ?DOS: the patient was seen and examined on 11/03/2021 ?  ?Brief hospital course: ?No notes on file ? ?Assessment and Plan: ?* NSTEMI (non-ST elevated myocardial infarction) ([place]) ?Patient admitted with dyspnea on exertion ?Troponins elevated on admission consistent with NSTEMI ?Echo: EF 40-45% with WMA ?Plans are for cardiac cath once pulmonary status stabilized  ?She is on IV heparin, ASA, BB, statin ? ?Acute systolic CHF (congestive heart failure) ([place]) ?Noted to have elevated BNP and evidence of volume overload with peripheral edema ?EF: 40-45% on echo ?Started on IV lasix with good urine output ?Continue IV diuresis ?Cardiology following ? ?Abdominal pain ?Unclear etiology ?Abdomen is benign on exam ?CT abdomen unrevealing ?Continue to monitor ? ?Acute respiratory failure with hypoxia ([place]) ?Secondary to COPD/CHF ?Required bipap on admission due to respiratory distress ?She has since been weaned down to nasal cannula ? ?Elevated LFTs ?Noted to have mild uptrend in LFTs ??related to CHF ?CT abd performed overnight without any hepatobiliary abnormality ?Will check RUQ ultrasound ?Lipase normal ?Abd is benign on exam ?Check hepatitis panel ?Discontinue statin if LFTs continue to trend up ? ? ?Prolonged QT interval ?Initial QTc 561 on admission ?Follow up QTc today 441 ? ?Tobacco dependence ?-Encourage cessation.   ?-This was discussed with the patient and should be reviewed on an ongoing basis.   ?-Patch ordered at patient request. ? ?COPD with chronic bronchitis ([place]) ?Had some shortness of breath and wheezing overnight ?Will continue on bronchodilators/inhaled steroids/mucolytics ?Will start steroids as she is still wheezing ?-Needs smoking cessation ? ?Partial symptomatic epilepsy with complex partial seizures, not intractable, without status epilepticus ([place]) ?-Recently seen by Dr. Krista Blue and transitioned onto  Lamictal ?-No recent seizures per patient report ? ? ? ? ?  ? ?Subjective: had some shortness of breath overnight requiring bipap. Also had some transient abdominal pain ? ?Physical Exam: ?Vitals:  ? 11/03/21 0800 11/03/21 0824 11/03/21 0856 11/03/21 0915  ?BP: (!) 151/87 (!) 164/77  (!) 146/78  ?Pulse: 79 70  66  ?Resp: (!) 21   17  ?Temp:      ?TempSrc:      ?SpO2: 100%   100%  ?Weight:   42.6 kg   ?Height:   '5\' 2"'$  (1.575 m)   ? ?General exam: Alert, awake, oriented x 3 ?Respiratory system: diminished breath sounds with bilateral wheezes. Respiratory effort normal. ?Cardiovascular system:RRR. No murmurs, rubs, gallops. ?Gastrointestinal system: Abdomen is nondistended, soft and nontender. No organomegaly or masses felt. Normal bowel sounds heard. ?Central nervous system: Alert and oriented. No focal neurological deficits. ?Extremities: 1-2+ edema bilaterally ?Skin: No rashes, lesions or ulcers ?Psychiatry: Judgement and insight appear normal. Mood & affect appropriate.  ? ?Data Reviewed: ? ?Reviewed EKG that showed NSR, LFTs mildly elevated, renal function stable. Echo with depressed EF ? ?Family Communication: discussed with patient ? ?Disposition: ?Status is: Inpatient ?Remains inpatient appropriate because: need for ischemic evaluation with cardiac cath ? Planned Discharge Destination: Home ? ? ? ?Time spent: 35 minutes ? ?Author: ?Kathie Dike, MD ?11/03/2021 10:25 AM ? ?For on call review [email or web address].  ?

## 2021-11-03 NOTE — Assessment & Plan Note (Addendum)
Noted to have mild uptrend in LFTs ??related to CHF ?CT abd performed overnight without any hepatobiliary abnormality ?Will check RUQ ultrasound ?Lipase normal ?Abd is benign on exam ?Check hepatitis panel ?Discontinue statin if LFTs continue to trend up ? ?

## 2021-11-03 NOTE — Progress Notes (Addendum)
? ?Progress Note ? ?Patient Name: Christie Moreno ?Date of Encounter: 11/03/2021 ? ?[facility] Cardiologist: None  ? ?Subjective  ? ?Sitting up in bed, wearing [place]. No chest pain, breathing has improved.  ? ?Inpatient Medications  ?  ?Scheduled Meds: ? aspirin EC  81 mg Oral Daily  ? atorvastatin  40 mg Oral q1800  ? carvedilol  3.125 mg Oral BID WC  ? furosemide  40 mg Intravenous Daily  ? lamoTRIgine  100 mg Oral BID  ? nicotine  14 mg Transdermal Daily  ? sodium chloride flush  3 mL Intravenous Q12H  ? ?Continuous Infusions: ? sodium chloride    ? heparin 650 Units/hr (11/02/21 1828)  ? ?PRN Meds: ?sodium chloride, acetaminophen, ipratropium-albuterol, nitroGLYCERIN, ondansetron (ZOFRAN) IV, sodium chloride flush  ? ?Vital Signs  ?  ?Vitals:  ? 11/03/21 0515 11/03/21 0545 11/03/21 0600 11/03/21 0630  ?BP: (!) 153/86 (!) 149/74 (!) 168/79 139/79  ?Pulse: 81 72 76 74  ?Resp: '20 20 20 '$ (!) 21  ?Temp:      ?TempSrc:      ?SpO2: 99% 99% 99% 100%  ?Weight:      ? ? ?Intake/Output Summary (Last 24 hours) at 11/03/2021 0744 ?Last data filed at 11/03/2021 0456 ?Gross per 24 hour  ?Intake 30.96 ml  ?Output 1000 ml  ?Net -969.04 ml  ? ? ?  11/02/2021  ?  9:36 AM 08/10/2021  ?  3:00 PM 02/03/2021  ?  2:46 PM  ?Last 3 Weights  ?Weight (lbs) 94 lb 89 lb 89 lb  ?Weight (kg) 42.638 kg 40.37 kg 40.37 kg  ?   ? ?Telemetry  ?  ?SR, ST, PACs, PVCs, NSVT - Personally Reviewed ? ?ECG  ?  ?ST, 119 bpm, non specific changes, LVH - Personally Reviewed ? ?Physical Exam  ? ?GEN: No acute distress. Thin female, appearing older than stated age. [place] ?Neck: No JVD ?Cardiac: RRR, no murmurs, rubs, or gallops.  ?Respiratory: Clear to auscultation bilaterally. ?GI: Soft, nontender, non-distended  ?MS: 1+ bilateral LE edema; No deformity. ?Neuro:  Nonfocal  ?Psych: Normal affect  ? ?Labs  ?  ?High Sensitivity Troponin:   ?Recent Labs  ?Lab 11/02/21 ?0431 11/02/21 ?0841  ?TROPONINIHS 791* 1,294*  ?   ?Chemistry ?Recent Labs  ?Lab 11/02/21 ?0431  11/02/21 ?2094 11/02/21 ?7096  ?NA 137 136 136  ?K 4.0 3.9 4.1  ?CL 105  --   --   ?CO2 21*  --   --   ?GLUCOSE 167*  --   --   ?BUN 24*  --   --   ?CREATININE 1.02*  --   --   ?CALCIUM 8.3*  --   --   ?GFRNONAA >60  --   --   ?ANIONGAP 11  --   --   ?  ?Lipids  ?Recent Labs  ?Lab 11/02/21 ?1122  ?CHOL 168  ?TRIG 15  ?HDL 99  ?[place] 66  ?CHOLHDL 1.7  ?  ?Hematology ?Recent Labs  ?Lab 11/02/21 ?2836 11/02/21 ?6294 11/02/21 ?7654  ?WBC  --  11.5*  --   ?RBC  --  4.06  --   ?HGB 12.2 12.4 12.9  ?HCT 36.0 37.7 38.0  ?MCV  --  92.9  --   ?MCH  --  30.5  --   ?MCHC  --  32.9  --   ?RDW  --  13.7  --   ?PLT  --  114*  --   ? ?Thyroid  ?Recent  Labs  ?Lab 11/02/21 ?1122  ?TSH 2.025  ?  ?BNP ?Recent Labs  ?Lab 11/02/21 ?0086  ?BNP 2,444.1*  ?  ?DDimer No results for input(s): DDIMER in the last 168 hours.  ? ?Radiology  ?  ?CT ABDOMEN PELVIS WO CONTRAST ? ?Result Date: 11/03/2021 ?CLINICAL DATA:  Abdominal pain, acute, nonlocalized EXAM: CT ABDOMEN AND PELVIS WITHOUT CONTRAST TECHNIQUE: Multidetector CT imaging of the abdomen and pelvis was performed following the standard protocol without IV contrast. RADIATION DOSE REDUCTION: This exam was performed according to the departmental dose-optimization program which includes automated exposure control, adjustment of the mA and/or kV according to patient size and/or use of iterative reconstruction technique. COMPARISON:  10/29/2010 FINDINGS: Lower chest: Small bilateral pleural effusions. Bibasilar atelectasis. Coronary artery and aortic calcifications. Hepatobiliary: No focal hepatic abnormality. Gallbladder unremarkable. Pancreas: No focal abnormality or ductal dilatation. Spleen: No focal abnormality.  Normal size. Adrenals/Urinary Tract: No adrenal abnormality. No focal renal abnormality. No stones or hydronephrosis. Urinary bladder is unremarkable. Stomach/Bowel: Stomach, large and small bowel grossly unremarkable. Vascular/Lymphatic: Diffuse aortoiliac atherosclerosis. No  evidence of aneurysm or adenopathy. Reproductive: Uterus and adnexa unremarkable.  No mass. Other: No free fluid or free air. Musculoskeletal: No acute bony abnormality. IMPRESSION: Small bilateral pleural effusions.  Bibasilar atelectasis. Coronary artery disease.  Aortic atherosclerosis. No definite acute process in the abdomen or pelvis. Electronically Signed   By: Rolm Baptise M.D.   On: 11/03/2021 01:07  ? ?CT Angio Chest PE W and/or Wo Contrast ? ?Result Date: 11/02/2021 ?CLINICAL DATA:  High probability of pulmonary embolism. EXAM: CT ANGIOGRAPHY CHEST WITH CONTRAST TECHNIQUE: Multidetector CT imaging of the chest was performed using the standard protocol during bolus administration of intravenous contrast. Multiplanar CT image reconstructions and MIPs were obtained to evaluate the vascular anatomy. RADIATION DOSE REDUCTION: This exam was performed according to the departmental dose-optimization program which includes automated exposure control, adjustment of the mA and/or kV according to patient size and/or use of iterative reconstruction technique. CONTRAST:  69m OMNIPAQUE IOHEXOL 350 MG/ML SOLN COMPARISON:  None. FINDINGS: Cardiovascular: Satisfactory opacification of the pulmonary arteries to the segmental level. No evidence of pulmonary embolism. Normal heart size. No pericardial effusion. Atherosclerosis of thoracic aorta is noted without aneurysm formation. Mediastinum/Nodes: No enlarged mediastinal, hilar, or axillary lymph nodes. Thyroid gland, trachea, and esophagus demonstrate no significant findings. Lungs/Pleura: No pneumothorax is noted. Minimal bilateral pleural effusions are noted with adjacent subsegmental atelectasis. Emphysematous disease is noted bilaterally. Upper Abdomen: No acute abnormality. Musculoskeletal: No chest wall abnormality. No acute or significant osseous findings. Review of the MIP images confirms the above findings. IMPRESSION: No definite evidence of pulmonary embolus.  Minimal bilateral pleural effusions are noted with adjacent subsegmental atelectasis. Aortic Atherosclerosis (ICD10-I70.0) and Emphysema (ICD10-J43.9). Electronically Signed   By: JMarijo ConceptionM.D.   On: 11/02/2021 09:40  ? ?DG CHEST PORT 1 VIEW ? ?Result Date: 11/02/2021 ?CLINICAL DATA:  Shortness of breath EXAM: PORTABLE CHEST 1 VIEW COMPARISON:  11/02/2021 FINDINGS: There is hyperinflation of the lungs compatible with COPD. Heart and mediastinal contours are within normal limits. No focal opacities or effusions. No acute bony abnormality. IMPRESSION: COPD.  No active cardiopulmonary disease. Electronically Signed   By: KRolm BaptiseM.D.   On: 11/02/2021 23:58  ? ?DG Chest Portable 1 View ? ?Result Date: 11/02/2021 ?CLINICAL DATA:  66year old female with history of shortness of breath. Respiratory distress. EXAM: PORTABLE CHEST 1 VIEW COMPARISON:  No priors. FINDINGS: Lung volumes are increased with emphysematous changes.  No acute consolidative airspace disease. No pleural effusions. No pneumothorax. No evidence of pulmonary edema. No definite suspicious appearing pulmonary nodules or masses are noted. Heart size is normal. Upper mediastinal contours are within normal limits. Atherosclerotic calcifications are noted in the thoracic aorta. IMPRESSION: 1. No radiographic evidence of acute cardiopulmonary disease. 2. Emphysema. 3. Aortic atherosclerosis. Electronically Signed   By: Vinnie Langton M.D.   On: 11/02/2021 05:01  ? ?ECHOCARDIOGRAM COMPLETE ? ?Result Date: 11/02/2021 ?   ECHOCARDIOGRAM REPORT   Patient Name:   Christie Moreno Date of Exam: 11/02/2021 Medical Rec #:  [id number]          Height:       62.0 in Accession #:    [id number]         Weight:       94.0 lb Date of Birth:  [birth year]          BSA:          1.387 m? Patient Age:    21 years           BP:           148/64 mmHg Patient Gender: F                  HR:           75 bpm. Exam Location:  Inpatient Procedure: 2D Echo, 3D Echo, Color Doppler  and Cardiac Doppler Indications:    Shortness of breath (per patient)                 CP per order  History:        Patient has no prior history of Echocardiogram examinations.  Sonographer:    [place]

## 2021-11-03 NOTE — H&P (Signed)
No Resp. Distress noted. No BIPAP needed at this time. RT will monitor.  ?

## 2021-11-03 NOTE — Plan of Care (Signed)
  Problem: Education: Goal: Knowledge of General Education information will improve Description Including pain rating scale, medication(s)/side effects and non-pharmacologic comfort measures Outcome: Progressing   

## 2021-11-03 NOTE — Plan of Care (Signed)
?  Problem: Education: ?Goal: Knowledge of General Education information will improve ?Description: Including pain rating scale, medication(s)/side effects and non-pharmacologic comfort measures ?Outcome: Progressing ?  ?Problem: Health Behavior/Discharge Planning: ?Goal: Ability to manage health-related needs will improve ?Outcome: Progressing ?  ?Problem: Clinical Measurements: ?Goal: Ability to maintain clinical measurements within normal limits will improve ?Outcome: Progressing ?Goal: Diagnostic test results will improve ?Outcome: Progressing ?Goal: Cardiovascular complication will be avoided ?Outcome: Progressing ?  ?Problem: Activity: ?Goal: Risk for activity intolerance will decrease ?Outcome: Progressing ?  ?Problem: Pain Managment: ?Goal: General experience of comfort will improve ?Outcome: Progressing ?  ?

## 2021-11-03 NOTE — TOC Progression Note (Signed)
Transition of Care (TOC) - Progression Note  ? ? ?Patient Details  ?Name: Christie Moreno ?MRN: [id number] ?Date of Birth: 09/28/1955 ? ?Transition of Care (TOC) CM/SW Contact  ?Angelita Ingles, RN ?Phone Number:[phone] ? ?11/03/2021, 3:57 PM ? ?Clinical Narrative:    ? ?Transition of Care (TOC) Screening Note ? ? ?Patient Details  ?Name: Christie Moreno ?Date of Birth: [birth year] ? ? ?Transition of Care (TOC) CM/SW Contact:    ?Angelita Ingles, RN ?Phone Number: ?11/03/2021, 3:57 PM ? ? ? ?Transition of Care Department [facility]) has reviewed patient and no TOC needs have been identified at this time. We will continue to monitor patient advancement through interdisciplinary progression rounds. If new patient transition needs arise, please place a TOC consult. ? ? ? ? ?  ?  ? ?Expected Discharge Plan and Services ?  ?  ?  ?  ?  ?                ?  ?  ?  ?  ?  ?  ?  ?  ?  ?  ? ? ?Social Determinants of Health (SDOH) Interventions ?  ? ?Readmission Risk Interventions ?   ? View : No data to display.  ?  ?  ?  ? ? ?

## 2021-11-03 NOTE — Progress Notes (Signed)
ANTICOAGULATION CONSULT NOTE - Follow-Up Consult ? ?Pharmacy Consult for Heparin ?Indication: chest pain/ACS ? ?Allergies  ?Allergen Reactions  ? Azithromycin Nausea And Vomiting  ? Erythromycin   ?  Sensitivity (GI)  ? Percocet [Oxycodone-Acetaminophen]   ? Penicillins Itching and Rash  ? ? ?Patient Measurements: ?Height: '5\' 2"'$  (157.5 cm) ?Weight: 42.6 kg (94 lb) ?IBW/kg (Calculated) : 50.1 ?Heparin Dosing Weight: 42.6 kg ? ?Vital Signs: ?BP: 146/78 (04/04 0915) ?Pulse Rate: 66 (04/04 0915) ? ?Labs: ?Recent Labs  ?  11/02/21 ?0431 11/02/21 ?8309 11/02/21 ?4076 11/02/21 ?8088 11/02/21 ?1103 11/02/21 ?1720 11/03/21 ?1594 11/03/21 ?5859  ?HGB  --    < > 12.4  --  12.9  --   --  11.2*  ?HCT  --    < > 37.7  --  38.0  --   --  33.9*  ?PLT  --   --  114*  --   --   --   --  107*  ?HEPARINUNFRC  --   --   --   --   --  <0.10* 0.21* 0.58  ?CREATININE 1.02*  --   --   --   --   --   --  1.11*  ?TROPONINIHS 791*  --   --  1,294*  --   --   --   --   ? < > = values in this interval not displayed.  ? ?Estimated Creatinine Clearance: 33.1 mL/min (A) (by C-G formula based on SCr of 1.11 mg/dL (H)). ? ?Medical History: ?Past Medical History:  ?Diagnosis Date  ? Seizures ([place])   ? none in years, recently had medication change  ? ? ?Medications:  ?(Not in a hospital admission) ? ?Scheduled:  ? aspirin EC  81 mg Oral Daily  ? atorvastatin  40 mg Oral q1800  ? carvedilol  6.25 mg Oral BID WC  ? furosemide  40 mg Intravenous Daily  ? lamoTRIgine  100 mg Oral BID  ? nicotine  14 mg Transdermal Daily  ? sodium chloride flush  3 mL Intravenous Q12H  ? ?Infusions:  ? sodium chloride    ? heparin 650 Units/hr (11/02/21 1828)  ? ?PRN: sodium chloride, acetaminophen, ipratropium-albuterol, nitroGLYCERIN, ondansetron (ZOFRAN) IV, sodium chloride flush ? ?Assessment: ?78 yof with NSTEMI and acute decompensated HF. No anticoagulation prior to admission. Pharmacy consulted for heparin.   ?  ?Patient lost IV access around 2300 for ~30-45 minutes.  Heparin level at 8:23 is 0.58 and therapeutic on 650 units/hr. ?  ? ?Goal of Therapy:  ?Heparin level 0.3-0.7 units/ml ?Monitor platelets by anticoagulation protocol: Yes ?  ?Plan:  ?Continue heparin infusion to 650 units/hr ?Monitor daily heparin level, CBC ?Monitor for signs/symptoms of bleeding  ? ? ?Benetta Spar, PharmD, BCPS, BCCP ?Clinical Pharmacist ? ?Please check AMION for all [place] phone numbers ?After 10:00 PM, call [place] [phone] ? ?

## 2021-11-03 NOTE — Assessment & Plan Note (Signed)
Secondary to COPD/CHF ?Required bipap on admission due to respiratory distress ?She has since been weaned down to nasal cannula ?

## 2021-11-03 NOTE — Assessment & Plan Note (Addendum)
Patient was placed on IV furosemide for diuresis, negative fluid balance was achieved -4,404 since admission with significant improvement of her symptoms.  ? ?Echocardiogram with mild reduction in LV systolic function to 40 to 45%, apical hypokinesis, with remaining walls hypokinetic, RV with preserved systolic function, trivial pericardial effusion, no significant valvular disease.  ? ?Cardiac catheterization with mean pulmonary pressure 24, cardiac index 2,7 with capillary wedge pressure 19.  ? ?Plan to continue medical therapy with carvedilol, losartan, empagliflozin. ?Diuresis with furosemide.  ?

## 2021-11-03 NOTE — Assessment & Plan Note (Addendum)
Resolved

## 2021-11-03 NOTE — Progress Notes (Signed)
ANTICOAGULATION CONSULT NOTE - Follow-Up Consult ? ?Pharmacy Consult for Heparin ?Indication: chest pain/ACS ? ?Allergies  ?Allergen Reactions  ? Azithromycin Nausea And Vomiting  ? Erythromycin   ?  Sensitivity (GI)  ? Percocet [Oxycodone-Acetaminophen]   ? Penicillins Itching and Rash  ? ? ?Patient Measurements: ?Weight: 42.6 kg (94 lb) ?Heparin Dosing Weight: 42.6 kg ? ?Vital Signs: ?BP: 113/62 (04/04 0255) ?Pulse Rate: 84 (04/04 0255) ? ?Labs: ?Recent Labs  ?  11/02/21 ?0431 11/02/21 ?7001 11/02/21 ?7494 11/02/21 ?4967 11/02/21 ?5916 11/02/21 ?3846 11/02/21 ?1720 11/03/21 ?0220  ?HGB  --  12.2   < > 12.4  --  12.9  --   --   ?HCT  --  36.0  --  37.7  --  38.0  --   --   ?PLT  --   --   --  114*  --   --   --   --   ?HEPARINUNFRC  --   --   --   --   --   --  <0.10* 0.21*  ?CREATININE 1.02*  --   --   --   --   --   --   --   ?TROPONINIHS 791*  --   --   --  1,294*  --   --   --   ? < > = values in this interval not displayed.  ? ? ?Estimated Creatinine Clearance: 36 mL/min (A) (by C-G formula based on SCr of 1.02 mg/dL (H)). ? ?Medical History: ?Past Medical History:  ?Diagnosis Date  ? Seizures ([place])   ? none in years, recently had medication change  ? ? ?Medications:  ?(Not in a hospital admission) ? ?Scheduled:  ? aspirin EC  81 mg Oral Daily  ? atorvastatin  40 mg Oral q1800  ? carvedilol  3.125 mg Oral BID WC  ? furosemide  40 mg Intravenous Daily  ? lamoTRIgine  100 mg Oral BID  ? nicotine  14 mg Transdermal Daily  ? sodium chloride flush  3 mL Intravenous Q12H  ? ?Infusions:  ? sodium chloride    ? heparin 650 Units/hr (11/02/21 1828)  ? ?PRN: sodium chloride, acetaminophen, ipratropium-albuterol, nitroGLYCERIN, ondansetron (ZOFRAN) IV, sodium chloride flush ? ?Assessment: ?54 yof with a history of seizure. Patient is presenting with SOA, chest pain and lower extremity swelling. Heparin per pharmacy consult placed for chest pain/ACS. Patient not on anticoagulation prior to arrival. ? ?Heparin level  subtherapeutic: 0.21 Patient lost IV access around 2300 for ~30-45 minutes; will wait and reorder lab around 0800 ? ?Hgb 12.9; plt 114 ?hsTrop [id number] ? ?Goal of Therapy:  ?Heparin level 0.3-0.7 units/ml ?Monitor platelets by anticoagulation protocol: Yes ?  ?Plan:  ?Continue heparin infusion to 650 units/hr ?Check anti-Xa level in 8 hours and daily while on heparin ?Continue to monitor H&H and platelets ? ?Georga Bora, PharmD ?Clinical Pharmacist ?11/03/2021 3:19 AM ?Please check AMION for all [place] numbers ? ? ? ? ?

## 2021-11-04 ENCOUNTER — Encounter (HOSPITAL_COMMUNITY): Payer: Self-pay | Admitting: Internal Medicine

## 2021-11-04 DIAGNOSIS — J449 Chronic obstructive pulmonary disease, unspecified: Secondary | ICD-10-CM | POA: Diagnosis not present

## 2021-11-04 DIAGNOSIS — G40209 Localization-related (focal) (partial) symptomatic epilepsy and epileptic syndromes with complex partial seizures, not intractable, without status epilepticus: Secondary | ICD-10-CM | POA: Diagnosis not present

## 2021-11-04 DIAGNOSIS — I10 Essential (primary) hypertension: Secondary | ICD-10-CM

## 2021-11-04 DIAGNOSIS — I214 Non-ST elevation (NSTEMI) myocardial infarction: Secondary | ICD-10-CM | POA: Diagnosis not present

## 2021-11-04 DIAGNOSIS — N179 Acute kidney failure, unspecified: Secondary | ICD-10-CM | POA: Insufficient documentation

## 2021-11-04 DIAGNOSIS — E871 Hypo-osmolality and hyponatremia: Secondary | ICD-10-CM

## 2021-11-04 DIAGNOSIS — F17200 Nicotine dependence, unspecified, uncomplicated: Secondary | ICD-10-CM

## 2021-11-04 DIAGNOSIS — I5021 Acute systolic (congestive) heart failure: Secondary | ICD-10-CM | POA: Diagnosis not present

## 2021-11-04 LAB — COMPREHENSIVE METABOLIC PANEL
ALT: 111 U/L — ABNORMAL HIGH (ref 0–44)
AST: 56 U/L — ABNORMAL HIGH (ref 15–41)
Albumin: 2.7 g/dL — ABNORMAL LOW (ref 3.5–5.0)
Alkaline Phosphatase: 67 U/L (ref 38–126)
Anion gap: 8 (ref 5–15)
BUN: 28 mg/dL — ABNORMAL HIGH (ref 8–23)
CO2: 30 mmol/L (ref 22–32)
Calcium: 8.1 mg/dL — ABNORMAL LOW (ref 8.9–10.3)
Chloride: 95 mmol/L — ABNORMAL LOW (ref 98–111)
Creatinine, Ser: 1.01 mg/dL — ABNORMAL HIGH (ref 0.44–1.00)
GFR, Estimated: >60 mL/min (ref >60)
Glucose, Bld: 113 mg/dL — ABNORMAL HIGH (ref 70–99)
Potassium: 4.1 mmol/L (ref 3.5–5.1)
Sodium: 133 mmol/L — ABNORMAL LOW (ref 135–145)
Total Bilirubin: 0.2 mg/dL — ABNORMAL LOW (ref 0.3–1.2)
Total Protein: 5.1 g/dL — ABNORMAL LOW (ref 6.5–8.1)

## 2021-11-04 LAB — CBC
HCT: 35 % — ABNORMAL LOW (ref 36.0–46.0)
Hemoglobin: 11.8 g/dL — ABNORMAL LOW (ref 12.0–15.0)
MCH: 30.3 pg (ref 26.0–34.0)
MCHC: 33.7 g/dL (ref 30.0–36.0)
MCV: 89.7 fL (ref 80.0–100.0)
Platelets: 119 10*3/uL — ABNORMAL LOW (ref 150–400)
RBC: 3.9 MIL/uL (ref 3.87–5.11)
RDW: 13.4 % (ref 11.5–15.5)
WBC: 7.4 10*3/uL (ref 4.0–10.5)
nRBC: 0 % (ref 0.0–0.2)

## 2021-11-04 LAB — HEPARIN LEVEL (UNFRACTIONATED)
Heparin Unfractionated: 0.13 IU/mL — ABNORMAL LOW (ref 0.30–0.70)
Heparin Unfractionated: 0.14 IU/mL — ABNORMAL LOW (ref 0.30–0.70)
Heparin Unfractionated: 0.35 IU/mL (ref 0.30–0.70)

## 2021-11-04 LAB — HEPATITIS PANEL, ACUTE
HCV Ab: NONREACTIVE
Hep A IgM: NONREACTIVE
Hep B C IgM: NONREACTIVE
Hepatitis B Surface Ag: NONREACTIVE

## 2021-11-04 MED ORDER — IPRATROPIUM-ALBUTEROL 0.5-2.5 (3) MG/3ML IN SOLN
3.0000 mL | Freq: Two times a day (BID) | RESPIRATORY_TRACT | Status: DC
  Administered 2021-11-04 – 2021-11-06 (×3): 3 mL via RESPIRATORY_TRACT
  Filled 2021-11-04 (×4): qty 3

## 2021-11-04 MED ORDER — IPRATROPIUM-ALBUTEROL 0.5-2.5 (3) MG/3ML IN SOLN
3.0000 mL | Freq: Three times a day (TID) | RESPIRATORY_TRACT | Status: DC
  Administered 2021-11-04: 3 mL via RESPIRATORY_TRACT
  Filled 2021-11-04: qty 3

## 2021-11-04 MED ORDER — SODIUM CHLORIDE 0.9% FLUSH: 3.0000 mL | INTRAVENOUS | Status: DC | PRN

## 2021-11-04 MED ORDER — SODIUM CHLORIDE 0.9 % IV SOLN: 250.0000 mL | INTRAVENOUS | Status: DC | PRN

## 2021-11-04 MED ORDER — POTASSIUM CHLORIDE CRYS ER 20 MEQ PO TBCR
20.0000 meq | EXTENDED_RELEASE_TABLET | Freq: Once | ORAL | Status: AC
  Administered 2021-11-04: 20 meq via ORAL
  Filled 2021-11-04: qty 1

## 2021-11-04 MED ORDER — ASPIRIN 81 MG PO CHEW
81.0000 mg | CHEWABLE_TABLET | ORAL | Status: AC
  Administered 2021-11-05: 81 mg via ORAL
  Filled 2021-11-04: qty 1

## 2021-11-04 MED ORDER — SODIUM CHLORIDE 0.9% FLUSH
3.0000 mL | Freq: Two times a day (BID) | INTRAVENOUS | Status: DC
  Administered 2021-11-04 – 2021-11-06 (×4): 3 mL via INTRAVENOUS

## 2021-11-04 NOTE — Progress Notes (Addendum)
ANTICOAGULATION CONSULT NOTE ? ?Pharmacy Consult for Heparin ?Indication: chest pain/ACS ? ?Allergies  ?Allergen Reactions  ? Azithromycin Nausea And Vomiting  ? Erythromycin   ?  Sensitivity (GI)  ? Percocet [Oxycodone-Acetaminophen]   ? Penicillins Itching and Rash  ? ? ?Patient Measurements: ?Height: '5\' 2"'$  (157.5 cm) ?Weight: 38.8 kg (85 lb 8 oz) ?IBW/kg (Calculated) : 50.1 ?Heparin Dosing Weight: 42.6 kg ? ?Vital Signs: ?Temp: 98 ?F (36.7 ?C) (04/05 1932) ?Temp Source: Oral (04/05 1932) ?BP: 159/73 (04/05 1932) ?Pulse Rate: 65 (04/05 1932) ? ?Labs: ?Recent Labs  ?  11/02/21 ?0431 11/02/21 ?3500 11/02/21 ?9381 11/02/21 ?8299 11/02/21 ?3716 11/02/21 ?1720 11/03/21 ?9678 11/04/21 ?9381 11/04/21 ?0175 11/04/21 ?2021  ?HGB  --    < > 12.4  --  12.9  --  11.2* 11.8*  --   --   ?HCT  --    < > 37.7  --  38.0  --  33.9* 35.0*  --   --   ?PLT  --   --  114*  --   --   --  107* 119*  --   --   ?HEPARINUNFRC  --   --   --   --   --    < > 0.58 0.13* 0.14* 0.35  ?CREATININE 1.02*  --   --   --   --   --  1.11* 1.01*  --   --   ?TROPONINIHS 791*  --   --  1,294*  --   --   --   --   --   --   ? < > = values in this interval not displayed.  ? ? ?Estimated Creatinine Clearance: 33.1 mL/min (A) (by C-G formula based on SCr of 1.01 mg/dL (H)). ? ? ?Assessment: ?61 yof with NSTEMI and acute decompensated HF. No anticoagulation prior to admission. Pharmacy consulted for heparin.  Plans for cath lab 4/5 or 4/6.  ?  ?Heparin level therapeutic at 0.35 units/mL on infusion at 900 units/hr. No issues with line or bleeding noted per RN. ? ?Goal of Therapy:  ?Heparin level 0.3-0.7 units/ml ?Monitor platelets by anticoagulation protocol: Yes ?  ?Plan:  ?Increase heparin infusion to 950 units/hr to maintain therapeutic goal ?F/U AM labs  ?  ?Kandon Hosking D. Mina Marble, PharmD, BCPS, BCCCP ?11/04/2021, 8:42 PM ? ?

## 2021-11-04 NOTE — Progress Notes (Signed)
?Progress Note ? ? ?Patient: Christie Moreno POE:[id number] DOB: Apr 13, 1956 DOA: 11/02/2021     2 ?DOS: the patient was seen and examined on 11/04/2021 ?  ?Brief hospital course: ?Mrs. Balderrama was admitted to the hospital with the working diagnosis of decompensated heart failure.  ? ?66 yo female with the past medical history of seizures. She reported few weeks of progressive dyspnea. Lately associated with PND and lower extremity edema. On the day of hospitalization she had a severe episode of PND, at 3:00 am and decided to come to the ED. As outpatient she was diagnosed with bronchitis and had steroid taper prescribed with no improvement in her symptoms. On her initial physical examination her blood pressure was 148/61, HR 92, RR 19, 02 saturation 99%, heart with S1 and S2 present and rhythmic, lungs with scattered rhonchi and rales bilaterally, no wheezing, positive +++ pitting bilateral lower extremity edema.  ? ?Na 137, K 4.0, CL 105, bicarbonate 21, glucose 167, bun 24, cr 1.0  ?High sensitive troponin 791  ?Wbc 6.1, hgb 13,7 hct 40,7 plt 120  ?TSH 6.5  ?Sars covid 19 negative ? ?Chest radiograph with hyperinflation with bilateral hilar vascular congestion, no infiltrates.  ? ?EKG 104 bpm, right axis deviation, Qtc 515, sinus rhythm, with no significant ST segment or T wave changes.  ? ?Patient was placed on IV heparin for NSTEMI.  ?Placed on furosemide for diuresis. ?Echocardiogram with reduced LV systolic function.   ? ? ?Assessment and Plan: ?* NSTEMI (non-ST elevated myocardial infarction) ([place]) ?Patient with no further chest pain, plan for further work up with cardiac catheterization. ? ?Medical therapy with IV heparin, aspirin, atorvastatin and carvedilol.  ? ?Acute systolic CHF (congestive heart failure) ([place]) ?Echocardiogram with mild reduction in LV systolic function to 40 to 45%, apical hypokinesis, with remaining walls hypokinetic, RV with preserved systolic function, trivial pericardial effusion, no  significant valvular disease.  ? ?Urine output over last 24 hrs is 750 ml  ? ?Plan to continue diuresis with furosemide, and continue B blockade with carvedilol.  ? ?COPD with chronic bronchitis ([place]) ?Patient with improved dyspnea and wheezing. ?Plan to continue bronchodilator therapy and supplemental 02 per [place]. ?Discontinue systemic steroids in the setting of acute coronary syndrome.  ? ?Partial symptomatic epilepsy with complex partial seizures, not intractable, without status epilepticus ([place]) ?-Recently seen by Dr. Krista Blue and transitioned onto Lamictal ?-No recent seizures per patient report ? ?Tobacco dependence ?Continue nicotine patch, smoking cessation.  ? ?Hyponatremia ?Renal function stable with serum cr at 1,0 ?Na is 133 and serum bicarbonate at 30 with K at 4,1  ?Follow up renal function in am and continue diuresis with furosemide.  ?Add oral Kcl to prevent hypokalemia. ? ? ? ? ?  ? ?Subjective: patient is feeling better, dyspnea and edema have improved, but not back to baseline  ? ?Physical Exam: ?Vitals:  ? 11/04/21 0400 11/04/21 0757 11/04/21 0801 11/04/21 1129  ?BP: (!) 153/70  (!) 169/82 (!) 157/65  ?Pulse: 61  68 63  ?Resp: '20  11 16  '$ ?Temp: 98.4 ?F (36.9 ?C)  97.7 ?F (36.5 ?C) 98.1 ?F (36.7 ?C)  ?TempSrc: Oral  Oral Oral  ?SpO2: 96% 100% 100% 93%  ?Weight: 38.8 kg     ?Height:      ? ?Neurology awake and alert ?ENT with no pallor ?Cardiovascular with S1 and S2 present and rhythmic with no gallops, rubs or murmurs ?No JVS ?Positive lower extremity edema + ?Respiratory with scattered rales but no wheezing ?  Abdomen not distended  ?Data Reviewed: ? ? ? ?Family Communication: no family a the bedside  ? ?Disposition: ?Status is: Inpatient ?Remains inpatient appropriate because: pending cardiac catheterization  ? Planned Discharge Destination: Home ? ? ? ?Author: ?Tawni Millers, MD ?11/04/2021 3:31 PM ? ?For on call review [email or web address].  ?

## 2021-11-04 NOTE — Hospital Course (Addendum)
Christie Moreno was admitted to the hospital with the working diagnosis of decompensated heart failure.  ? ?66 yo female with the past medical history of seizures. She reported few weeks of progressive dyspnea. Lately associated with PND and lower extremity edema. On the day of hospitalization she had a severe episode of PND, at 3:00 am and decided to come to the ED. As outpatient she was diagnosed with bronchitis and had steroid taper prescribed with no improvement in her symptoms. On her initial physical examination her blood pressure was 148/61, HR 92, RR 19, 02 saturation 99%, heart with S1 and S2 present and rhythmic, lungs with scattered rhonchi and rales bilaterally, no wheezing, positive +++ pitting bilateral lower extremity edema.  ? ?Na 137, K 4.0, CL 105, bicarbonate 21, glucose 167, bun 24, cr 1.0  ?High sensitive troponin 791  ?Wbc 6.1, hgb 13,7 hct 40,7 plt 120  ?TSH 6.5  ?Sars covid 19 negative ? ?Chest radiograph with hyperinflation with bilateral hilar vascular congestion, no infiltrates.  ? ?EKG 104 bpm, right axis deviation, Qtc 515, sinus rhythm, with no significant ST segment or T wave changes.  ? ?Patient was placed on IV heparin for NSTEMI.  ?Placed on furosemide for diuresis. ?Echocardiogram with reduced LV systolic function.   ? ?04/06 Cardiac catheterization no significant coronary artery disease. Mean PA pressure 24 mmHg, with wedge pressure 19.  ? ?Patient's volume has improved, placed on guideline directed medical therapy and plan for follow up as outpatient.  ?

## 2021-11-04 NOTE — H&P (Signed)
66 year old with new onset heart failure and mildly reduced EF.  Right and left heart cath with coronary angiography is being done to assess for etiology of heart failure ?

## 2021-11-04 NOTE — H&P (View-Only) (Signed)
? ?Progress Note ? ?Patient Name: Christie Moreno ?Date of Encounter: 11/04/2021 ? ?[facility] Cardiologist: Dr. Quay Burow ? ?Subjective  ? ?Feels clinically improved this morning.  Breathing better.  Diuresed 1100 cc. ? ?Inpatient Medications  ?  ?Scheduled Meds: ? aspirin EC  81 mg Oral Daily  ? atorvastatin  40 mg Oral q1800  ? budesonide (PULMICORT) nebulizer solution  0.25 mg Nebulization BID  ? carvedilol  6.25 mg Oral BID WC  ? furosemide  40 mg Intravenous Daily  ? guaiFENesin  600 mg Oral BID  ? ipratropium-albuterol  3 mL Nebulization TID  ? lamoTRIgine  100 mg Oral BID  ? methylPREDNISolone (SOLU-MEDROL) injection  40 mg Intravenous Q12H  ? nicotine  14 mg Transdermal Daily  ? sodium chloride flush  3 mL Intravenous Q12H  ? ?Continuous Infusions: ? sodium chloride    ? heparin 750 Units/hr (11/04/21 0350)  ? ?PRN Meds: ?sodium chloride, acetaminophen, albuterol, nitroGLYCERIN, ondansetron (ZOFRAN) IV, sodium chloride flush  ? ?Vital Signs  ?  ?Vitals:  ? 11/04/21 0254 11/04/21 0400 11/04/21 0757 11/04/21 0801  ?BP:  (!) 153/70  (!) 169/82  ?Pulse:  61  68  ?Resp:  20  11  ?Temp:  98.4 ?F (36.9 ?C)  97.7 ?F (36.5 ?C)  ?TempSrc:  Oral  Oral  ?SpO2: 97% 96% 100% 100%  ?Weight:  38.8 kg    ?Height:      ? ? ?Intake/Output Summary (Last 24 hours) at 11/04/2021 0814 ?Last data filed at 11/04/2021 0415 ?Gross per 24 hour  ?Intake 211.19 ml  ?Output 750 ml  ?Net -538.81 ml  ? ? ?  11/04/2021  ?  4:00 AM 11/03/2021  ?  3:21 PM 11/03/2021  ?  8:56 AM  ?Last 3 Weights  ?Weight (lbs) 85 lb 8 oz 88 lb 13.5 oz 94 lb  ?Weight (kg) 38.783 kg 40.3 kg 42.638 kg  ?   ? ?Telemetry  ?  ?Sinus rhythm- Personally Reviewed ? ?ECG  ?  ?Not performed today- Personally Reviewed ? ?Physical Exam  ? ?GEN: No acute distress.   ?Neck: No JVD ?Cardiac: RRR, no murmurs, rubs, or gallops.  ?Respiratory: Clear to auscultation bilaterally. ?GI: Soft, nontender, non-distended  ?MS: No edema; No deformity. ?Neuro:  Nonfocal  ?Psych: Normal  affect  ? ?Labs  ?  ?High Sensitivity Troponin:   ?Recent Labs  ?Lab 11/02/21 ?0431 11/02/21 ?0841  ?TROPONINIHS 791* 1,294*  ?   ?Chemistry ?Recent Labs  ?Lab 11/02/21 ?0431 11/02/21 ?4818 11/02/21 ?5631 11/03/21 ?4970 11/04/21 ?0050  ?NA 137   < > 136 138 133*  ?K 4.0   < > 4.1 3.7 4.1  ?CL 105  --   --  98 95*  ?CO2 21*  --   --  32 30  ?GLUCOSE 167*  --   --  86 113*  ?BUN 24*  --   --  29* 28*  ?CREATININE 1.02*  --   --  1.11* 1.01*  ?CALCIUM 8.3*  --   --  7.8* 8.1*  ?PROT  --   --   --  5.3* 5.1*  ?ALBUMIN  --   --   --  2.9* 2.7*  ?AST  --   --   --  116* 56*  ?ALT  --   --   --  145* 111*  ?ALKPHOS  --   --   --  26 37  ?BILITOT  --   --   --  0.5 0.2*  ?GFRNONAA >60  --   --  54* >60  ?ANIONGAP 11  --   --  8 8  ? < > = values in this interval not displayed.  ?  ?Lipids  ?Recent Labs  ?Lab 11/02/21 ?1122  ?CHOL 168  ?TRIG 15  ?HDL 99  ?[place] 66  ?CHOLHDL 1.7  ?  ?Hematology ?Recent Labs  ?Lab 11/02/21 ?1610 11/02/21 ?9604 11/03/21 ?5409 11/04/21 ?0050  ?WBC 11.5*  --  8.9 7.4  ?RBC 4.06  --  3.71* 3.90  ?HGB 12.4 12.9 11.2* 11.8*  ?HCT 37.7 38.0 33.9* 35.0*  ?MCV 92.9  --  91.4 89.7  ?MCH 30.5  --  30.2 30.3  ?MCHC 32.9  --  33.0 33.7  ?RDW 13.7  --  13.6 13.4  ?PLT 114*  --  107* 119*  ? ?Thyroid  ?Recent Labs  ?Lab 11/02/21 ?1122  ?TSH 2.025  ?  ?BNP ?Recent Labs  ?Lab 11/02/21 ?8119  ?BNP 2,444.1*  ?  ?DDimer No results for input(s): DDIMER in the last 168 hours.  ? ?Radiology  ?  ?CT ABDOMEN PELVIS WO CONTRAST ? ?Result Date: 11/03/2021 ?CLINICAL DATA:  Abdominal pain, acute, nonlocalized EXAM: CT ABDOMEN AND PELVIS WITHOUT CONTRAST TECHNIQUE: Multidetector CT imaging of the abdomen and pelvis was performed following the standard protocol without IV contrast. RADIATION DOSE REDUCTION: This exam was performed according to the departmental dose-optimization program which includes automated exposure control, adjustment of the mA and/or kV according to patient size and/or use of iterative reconstruction  technique. COMPARISON:  10/29/2010 FINDINGS: Lower chest: Small bilateral pleural effusions. Bibasilar atelectasis. Coronary artery and aortic calcifications. Hepatobiliary: No focal hepatic abnormality. Gallbladder unremarkable. Pancreas: No focal abnormality or ductal dilatation. Spleen: No focal abnormality.  Normal size. Adrenals/Urinary Tract: No adrenal abnormality. No focal renal abnormality. No stones or hydronephrosis. Urinary bladder is unremarkable. Stomach/Bowel: Stomach, large and small bowel grossly unremarkable. Vascular/Lymphatic: Diffuse aortoiliac atherosclerosis. No evidence of aneurysm or adenopathy. Reproductive: Uterus and adnexa unremarkable.  No mass. Other: No free fluid or free air. Musculoskeletal: No acute bony abnormality. IMPRESSION: Small bilateral pleural effusions.  Bibasilar atelectasis. Coronary artery disease.  Aortic atherosclerosis. No definite acute process in the abdomen or pelvis. Electronically Signed   By: Rolm Baptise M.D.   On: 11/03/2021 01:07  ? ?DG CHEST PORT 1 VIEW ? ?Result Date: 11/02/2021 ?CLINICAL DATA:  Shortness of breath EXAM: PORTABLE CHEST 1 VIEW COMPARISON:  11/02/2021 FINDINGS: There is hyperinflation of the lungs compatible with COPD. Heart and mediastinal contours are within normal limits. No focal opacities or effusions. No acute bony abnormality. IMPRESSION: COPD.  No active cardiopulmonary disease. Electronically Signed   By: Rolm Baptise M.D.   On: 11/02/2021 23:58  ? ?ECHOCARDIOGRAM COMPLETE ? ?Result Date: 11/02/2021 ?   ECHOCARDIOGRAM REPORT   Patient Name:   Christie Moreno Date of Exam: 11/02/2021 Medical Rec #:  [id number]          Height:       62.0 in Accession #:    [id number]         Weight:       94.0 lb Date of Birth:  August 03, 1955          BSA:          1.387 m? Patient Age:    66 years           BP:           148/64 mmHg Patient Gender:  F                  HR:           75 bpm. Exam Location:  Inpatient Procedure: 2D Echo, 3D Echo, Color Doppler  and Cardiac Doppler Indications:    Shortness of breath (per patient)                 CP per order  History:        Patient has no prior history of Echocardiogram examinations.  Sonographer:    [place] Referring Phys: [id number] Margie Billet  Sonographer Comments: Technically difficult study due to poor echo windows. IMPRESSIONS  1. Apical hypokinesis with remaining walls hypokinetic; overall mild to moderate LV dysfunction; calcified aortic valve but no AS by doppler; moderate MAC with mild mitral stenosis.  2. Left ventricular ejection fraction, by estimation, is 40 to 45%. The left ventricle has mild to moderately decreased function. The left ventricle demonstrates regional wall motion abnormalities (see scoring diagram/findings for description). There is  mild left ventricular hypertrophy. Left ventricular diastolic parameters are consistent with Grade I diastolic dysfunction (impaired relaxation). Elevated left atrial pressure.  3. Right ventricular systolic function is normal. The right ventricular size is normal.  4. The mitral valve is normal in structure. Trivial mitral valve regurgitation. Mild mitral stenosis. Moderate mitral annular calcification.  5. The aortic valve is calcified. Aortic valve regurgitation is trivial. No aortic stenosis is present.  6. The inferior vena cava is normal in size with greater than 50% respiratory variability, suggesting right atrial pressure of 3 mmHg. Comparison(s): No prior Echocardiogram. FINDINGS  Left Ventricle: Left ventricular ejection fraction, by estimation, is 40 to 45%. The left ventricle has mild to moderately decreased function. The left ventricle demonstrates regional wall motion abnormalities. The left ventricular internal cavity size was normal in size. There is mild left ventricular hypertrophy. Left ventricular diastolic parameters are consistent with Grade I diastolic dysfunction (impaired relaxation). Elevated left atrial pressure. Right Ventricle:  The right ventricular size is normal. Right ventricular systolic function is normal. Left Atrium: Left atrial size was normal in size. Right Atrium: Right atrial size was normal in size. Pericardium: T

## 2021-11-04 NOTE — Progress Notes (Signed)
? ?Progress Note ? ?Patient Name: Christie Moreno ?Date of Encounter: 11/04/2021 ? ?[facility] Cardiologist: Dr. Montez Cuda ? ?Subjective  ? ?Feels clinically improved this morning.  Breathing better.  Diuresed 1100 cc. ? ?Inpatient Medications  ?  ?Scheduled Meds: ? aspirin EC  81 mg Oral Daily  ? atorvastatin  40 mg Oral q1800  ? budesonide (PULMICORT) nebulizer solution  0.25 mg Nebulization BID  ? carvedilol  6.25 mg Oral BID WC  ? furosemide  40 mg Intravenous Daily  ? guaiFENesin  600 mg Oral BID  ? ipratropium-albuterol  3 mL Nebulization TID  ? lamoTRIgine  100 mg Oral BID  ? methylPREDNISolone (SOLU-MEDROL) injection  40 mg Intravenous Q12H  ? nicotine  14 mg Transdermal Daily  ? sodium chloride flush  3 mL Intravenous Q12H  ? ?Continuous Infusions: ? sodium chloride    ? heparin 750 Units/hr (11/04/21 0350)  ? ?PRN Meds: ?sodium chloride, acetaminophen, albuterol, nitroGLYCERIN, ondansetron (ZOFRAN) IV, sodium chloride flush  ? ?Vital Signs  ?  ?Vitals:  ? 11/04/21 0254 11/04/21 0400 11/04/21 0757 11/04/21 0801  ?BP:  (!) 153/70  (!) 169/82  ?Pulse:  61  68  ?Resp:  20  11  ?Temp:  98.4 ?F (36.9 ?C)  97.7 ?F (36.5 ?C)  ?TempSrc:  Oral  Oral  ?SpO2: 97% 96% 100% 100%  ?Weight:  38.8 kg    ?Height:      ? ? ?Intake/Output Summary (Last 24 hours) at 11/04/2021 0922 ?Last data filed at 11/04/2021 0415 ?Gross per 24 hour  ?Intake 211.19 ml  ?Output 750 ml  ?Net -538.81 ml  ? ? ?  11/04/2021  ?  4:00 AM 11/03/2021  ?  3:21 PM 11/03/2021  ?  8:56 AM  ?Last 3 Weights  ?Weight (lbs) 85 lb 8 oz 88 lb 13.5 oz 94 lb  ?Weight (kg) 38.783 kg 40.3 kg 42.638 kg  ?   ? ?Telemetry  ?  ?Sinus rhythm- Personally Reviewed ? ?ECG  ?  ?Not performed today- Personally Reviewed ? ?Physical Exam  ? ?GEN: No acute distress.   ?Neck: No JVD ?Cardiac: RRR, no murmurs, rubs, or gallops.  ?Respiratory: Clear to auscultation bilaterally. ?GI: Soft, nontender, non-distended  ?MS: No edema; No deformity. ?Neuro:  Nonfocal  ?Psych: Normal  affect  ? ?Labs  ?  ?High Sensitivity Troponin:   ?Recent Labs  ?Lab 11/02/21 ?0431 11/02/21 ?0841  ?TROPONINIHS 791* 1,294*  ?   ?Chemistry ?Recent Labs  ?Lab 11/02/21 ?0431 11/02/21 ?0504 11/02/21 ?0852 11/03/21 ?0823 11/04/21 ?0050  ?NA 137   < > 136 138 133*  ?K 4.0   < > 4.1 3.7 4.1  ?CL 105  --   --  98 95*  ?CO2 21*  --   --  32 30  ?GLUCOSE 167*  --   --  86 113*  ?BUN 24*  --   --  29* 28*  ?CREATININE 1.02*  --   --  1.11* 1.01*  ?CALCIUM 8.3*  --   --  7.8* 8.1*  ?PROT  --   --   --  5.3* 5.1*  ?ALBUMIN  --   --   --  2.9* 2.7*  ?AST  --   --   --  116* 56*  ?ALT  --   --   --  145* 111*  ?ALKPHOS  --   --   --  71 67  ?BILITOT  --   --   --    0.5 0.2*  ?GFRNONAA >60  --   --  54* >60  ?ANIONGAP 11  --   --  8 8  ? < > = values in this interval not displayed.  ?  ?Lipids  ?Recent Labs  ?Lab 11/02/21 ?1122  ?CHOL 168  ?TRIG 15  ?HDL 99  ?LDLCALC 66  ?CHOLHDL 1.7  ?  ?Hematology ?Recent Labs  ?Lab 11/02/21 ?0549 11/02/21 ?0852 11/03/21 ?0823 11/04/21 ?0050  ?WBC 11.5*  --  8.9 7.4  ?RBC 4.06  --  3.71* 3.90  ?HGB 12.4 12.9 11.2* 11.8*  ?HCT 37.7 38.0 33.9* 35.0*  ?MCV 92.9  --  91.4 89.7  ?MCH 30.5  --  30.2 30.3  ?MCHC 32.9  --  33.0 33.7  ?RDW 13.7  --  13.6 13.4  ?PLT 114*  --  107* 119*  ? ?Thyroid  ?Recent Labs  ?Lab 11/02/21 ?1122  ?TSH 2.025  ?  ?BNP ?Recent Labs  ?Lab 11/02/21 ?0548  ?BNP 2,444.1*  ?  ?DDimer No results for input(s): DDIMER in the last 168 hours.  ? ?Radiology  ?  ?CT ABDOMEN PELVIS WO CONTRAST ? ?Result Date: 11/03/2021 ?CLINICAL DATA:  Abdominal pain, acute, nonlocalized EXAM: CT ABDOMEN AND PELVIS WITHOUT CONTRAST TECHNIQUE: Multidetector CT imaging of the abdomen and pelvis was performed following the standard protocol without IV contrast. RADIATION DOSE REDUCTION: This exam was performed according to the departmental dose-optimization program which includes automated exposure control, adjustment of the mA and/or kV according to patient size and/or use of iterative reconstruction  technique. COMPARISON:  10/29/2010 FINDINGS: Lower chest: Small bilateral pleural effusions. Bibasilar atelectasis. Coronary artery and aortic calcifications. Hepatobiliary: No focal hepatic abnormality. Gallbladder unremarkable. Pancreas: No focal abnormality or ductal dilatation. Spleen: No focal abnormality.  Normal size. Adrenals/Urinary Tract: No adrenal abnormality. No focal renal abnormality. No stones or hydronephrosis. Urinary bladder is unremarkable. Stomach/Bowel: Stomach, large and small bowel grossly unremarkable. Vascular/Lymphatic: Diffuse aortoiliac atherosclerosis. No evidence of aneurysm or adenopathy. Reproductive: Uterus and adnexa unremarkable.  No mass. Other: No free fluid or free air. Musculoskeletal: No acute bony abnormality. IMPRESSION: Small bilateral pleural effusions.  Bibasilar atelectasis. Coronary artery disease.  Aortic atherosclerosis. No definite acute process in the abdomen or pelvis. Electronically Signed   By: Kevin  Dover M.D.   On: 11/03/2021 01:07  ? ?DG CHEST PORT 1 VIEW ? ?Result Date: 11/02/2021 ?CLINICAL DATA:  Shortness of breath EXAM: PORTABLE CHEST 1 VIEW COMPARISON:  11/02/2021 FINDINGS: There is hyperinflation of the lungs compatible with COPD. Heart and mediastinal contours are within normal limits. No focal opacities or effusions. No acute bony abnormality. IMPRESSION: COPD.  No active cardiopulmonary disease. Electronically Signed   By: Kevin  Dover M.D.   On: 11/02/2021 23:58  ? ?ECHOCARDIOGRAM COMPLETE ? ?Result Date: 11/02/2021 ?   ECHOCARDIOGRAM REPORT   Patient Name:   Christie Moreno Date of Exam: 11/02/2021 Medical Rec #:  [id number]          Height:       62.0 in Accession #:    [id number]         Weight:       94.0 lb Date of Birth:  09/06/1955          BSA:          1.387 m? Patient Age:    65 years           BP:           148/64 mmHg Patient Gender:   F                  HR:           75 bpm. Exam Location:  Inpatient Procedure: 2D Echo, 3D Echo, Color Doppler  and Cardiac Doppler Indications:    Shortness of breath (per patient)                 CP per order  History:        Patient has no prior history of Echocardiogram examinations.  Sonographer:    TAMARA CROWN RDCS Referring Phys: [id number] XIKA ZHAO  Sonographer Comments: Technically difficult study due to poor echo windows. IMPRESSIONS  1. Apical hypokinesis with remaining walls hypokinetic; overall mild to moderate LV dysfunction; calcified aortic valve but no AS by doppler; moderate MAC with mild mitral stenosis.  2. Left ventricular ejection fraction, by estimation, is 40 to 45%. The left ventricle has mild to moderately decreased function. The left ventricle demonstrates regional wall motion abnormalities (see scoring diagram/findings for description). There is  mild left ventricular hypertrophy. Left ventricular diastolic parameters are consistent with Grade I diastolic dysfunction (impaired relaxation). Elevated left atrial pressure.  3. Right ventricular systolic function is normal. The right ventricular size is normal.  4. The mitral valve is normal in structure. Trivial mitral valve regurgitation. Mild mitral stenosis. Moderate mitral annular calcification.  5. The aortic valve is calcified. Aortic valve regurgitation is trivial. No aortic stenosis is present.  6. The inferior vena cava is normal in size with greater than 50% respiratory variability, suggesting right atrial pressure of 3 mmHg. Comparison(s): No prior Echocardiogram. FINDINGS  Left Ventricle: Left ventricular ejection fraction, by estimation, is 40 to 45%. The left ventricle has mild to moderately decreased function. The left ventricle demonstrates regional wall motion abnormalities. The left ventricular internal cavity size was normal in size. There is mild left ventricular hypertrophy. Left ventricular diastolic parameters are consistent with Grade I diastolic dysfunction (impaired relaxation). Elevated left atrial pressure. Right Ventricle:  The right ventricular size is normal. Right ventricular systolic function is normal. Left Atrium: Left atrial size was normal in size. Right Atrium: Right atrial size was normal in size. Pericardium: T

## 2021-11-04 NOTE — Progress Notes (Signed)
Heart Failure Nurse Navigator Progress Note ? ?PCP: Pcp, No ?PCP-Cardiologist: Needs one ?Admission Diagnosis:  ?Admitted from: Home ? ?Presentation:   ?Christie Moreno presented with Shortness of breath, respiratory distress, decompensated heart failure, NSTEMI, lower extremity edema, was taking medication for bronchitis.Started on IV heparin, Heart cath planned for 11/05/21. EF found to be 40-45%. Patients significant other at bedside, did lengthy education on the importance of taking all her medications as prescribed, daily weights, when to call her physician, fluid and diet restrictions, smoking cessation, and keeping all her scheduled doctor appointments. Both patient and significant other voiced their understanding. Has a [place] appointment on 11/11/21 @ 2pm. ? ?ECHO/ LVEF: 40-45% ? ?Clinical Course: ? ?Past Medical History:  ?Diagnosis Date  ? Seizures ([place])   ? none in years, recently had medication change  ?  ? ?Social History  ? ?Socioeconomic History  ? Marital status: Significant Other  ?  Spouse name: Janace Hoard  ? Number of children: 1  ? Years of education: Not on file  ? Highest education level: Bachelor's degree (e.g., BA, AB, BS)  ?Occupational History  ? Occupation: retired  ?Tobacco Use  ? Smoking status: Every Day  ?  Packs/day: 1.00  ?  Years: 52.00  ?  Pack years: 52.00  ?  Types: Cigarettes  ? Smokeless tobacco: Never  ? Tobacco comments:  ?  Currently 1 pack every 2-3 days  ?Vaping Use  ? Vaping Use: Never used  ?Substance and Sexual Activity  ? Alcohol use: Yes  ?  Comment: socially  ? Drug use: No  ? Sexual activity: Not on file  ?Other Topics Concern  ? Not on file  ?Social History Narrative  ? Not on file  ? ?Social Determinants of Health  ? ?Financial Resource Strain: Low Risk   ? Difficulty of Paying Living Expenses: Not hard at all  ?Food Insecurity: No Food Insecurity  ? Worried About Charity fundraiser in the Last Year: Never true  ? Ran Out of Food in the Last Year: Never true   ?Transportation Needs: No Transportation Needs  ? Lack of Transportation (Medical): No  ? Lack of Transportation (Non-Medical): No  ?Physical Activity: Not on file  ?Stress: Not on file  ?Social Connections: Not on file  ? ?Education Assessment and Provision: ? ?Detailed education and instructions provided on heart failure disease management including the following: ? ?Signs and symptoms of Heart Failure ?When to call the physician ?Importance of daily weights ?Low sodium diet ?Fluid restriction ?Medication management ?Anticipated future follow-up appointments ? ?Patient education given on each of the above topics.  Patient acknowledges understanding via teach back method and acceptance of all instructions. ? ?Education Materials:  "Living Better With Heart Failure" Booklet, HF zone tool, & Daily Weight Tracker Tool. ? ?Patient has scale at home: yes ?Patient has pill box at home: NA   ?High Risk Criteria for Readmission and/or Poor Patient Outcomes: ?Heart failure hospital admissions (last 6 months): 0  ?No Show rate: 0 % ?Difficult social situation: No ?Demonstrates medication adherence: yes ?Primary Language: English ?Literacy level: Reading, writing, and comprehension.  ? ?Barriers of Care:   ?Compliance with daily weights ?Eating habits (eats very little) ? ?Considerations/Referrals:  ? ?Referral made to Heart Failure Pharmacist Stewardship: yes, optimize ?Referral made to Heart Failure CSW/NCM TOC: smoking cessation ?Referral made to Heart & Vascular TOC clinic: yes, hospital F/U 11/11/21 @ 2pm. ? ?Items for Follow-up on DC/TOC: ?Optimize medication ?Smoking cessation ?Cont. Education  on eating habits ?Daily weights ? ? ?Earnestine Leys, BSN, RN ?Heart Failure Nurse Navigator ?[phone]   ?

## 2021-11-04 NOTE — Assessment & Plan Note (Addendum)
Hyponatremia and hypokalemia.  ? ?Peak creatine was 1,24 at the time of discharge her serum cr is 1,0 with K at 3,8 and serum bicarbonate at 30. Na 135.  ?Plan to continue diuresis with furosemide along with Kcl supplementation. ?Follow up renal function as outpatient.  ?

## 2021-11-04 NOTE — Progress Notes (Signed)
Bipap not needed at this time. Pt on [place] 1 l/min, sats 100%.  ?

## 2021-11-04 NOTE — Progress Notes (Signed)
ANTICOAGULATION CONSULT NOTE - Follow-Up Consult ? ?Pharmacy Consult for Heparin ?Indication: chest pain/ACS ? ?Allergies  ?Allergen Reactions  ? Azithromycin Nausea And Vomiting  ? Erythromycin   ?  Sensitivity (GI)  ? Percocet [Oxycodone-Acetaminophen]   ? Penicillins Itching and Rash  ? ? ?Patient Measurements: ?Height: '5\' 2"'$  (157.5 cm) ?Weight: 38.8 kg (85 lb 8 oz) ?IBW/kg (Calculated) : 50.1 ?Heparin Dosing Weight: 42.6 kg ? ?Vital Signs: ?Temp: 97.7 ?F (36.5 ?C) (04/05 0801) ?Temp Source: Oral (04/05 0801) ?BP: 169/82 (04/05 0801) ?Pulse Rate: 68 (04/05 0801) ? ?Labs: ?Recent Labs  ?  11/02/21 ?0431 11/02/21 ?4332 11/02/21 ?9518 11/02/21 ?8416 11/02/21 ?6063 11/02/21 ?1720 11/03/21 ?0160 11/03/21 ?1093 11/04/21 ?0050  ?HGB  --    < > 12.4  --  12.9  --   --  11.2* 11.8*  ?HCT  --    < > 37.7  --  38.0  --   --  33.9* 35.0*  ?PLT  --   --  114*  --   --   --   --  107* 119*  ?HEPARINUNFRC  --   --   --   --   --    < > 0.21* 0.58 0.13*  ?CREATININE 1.02*  --   --   --   --   --   --  1.11* 1.01*  ?TROPONINIHS 791*  --   --  1,294*  --   --   --   --   --   ? < > = values in this interval not displayed.  ? ? ?Estimated Creatinine Clearance: 33.1 mL/min (A) (by C-G formula based on SCr of 1.01 mg/dL (H)). ? ? ?Assessment: ?83 yof with NSTEMI and acute decompensated HF. No anticoagulation prior to admission. Pharmacy consulted for heparin.  Plans for cath lab 4/5 or 4/6.  ?  ?Heparin level remains subtherapeutic (0.14) on infusion at 750 units/hr. No issues with line or bleeding noted per RN. Will increase rate by 3-4 units/kg/hr.  ? ?Goal of Therapy:  ?Heparin level 0.3-0.7 units/ml ?Monitor platelets by anticoagulation protocol: Yes ?  ?Plan:  ?Increase heparin infusion to 900 units/hr ?F/u 8hr heparin level ?Daily heparin level + CBC ? ?Donald Pore, PharmD ?Pharmacy Resident ?11/04/2021, 11:10 AM ? ? ?

## 2021-11-04 NOTE — Progress Notes (Signed)
ANTICOAGULATION CONSULT NOTE - Follow-Up Consult ? ?Pharmacy Consult for Heparin ?Indication: chest pain/ACS ? ?Allergies  ?Allergen Reactions  ? Azithromycin Nausea And Vomiting  ? Erythromycin   ?  Sensitivity (GI)  ? Percocet [Oxycodone-Acetaminophen]   ? Penicillins Itching and Rash  ? ? ?Patient Measurements: ?Height: '5\' 2"'$  (157.5 cm) ?Weight: 40.3 kg (88 lb 13.5 oz) ?IBW/kg (Calculated) : 50.1 ?Heparin Dosing Weight: 42.6 kg ? ?Vital Signs: ?Temp: 98.4 ?F (36.9 ?C) (04/04 2300) ?Temp Source: Oral (04/04 2300) ?BP: 145/81 (04/04 2300) ?Pulse Rate: 74 (04/04 2300) ? ?Labs: ?Recent Labs  ?  11/02/21 ?0431 11/02/21 ?3646 11/02/21 ?8032 11/02/21 ?1224 11/02/21 ?8250 11/02/21 ?1720 11/03/21 ?0370 11/03/21 ?4888 11/04/21 ?0050  ?HGB  --    < > 12.4  --  12.9  --   --  11.2* 11.8*  ?HCT  --    < > 37.7  --  38.0  --   --  33.9* 35.0*  ?PLT  --   --  114*  --   --   --   --  107* 119*  ?HEPARINUNFRC  --   --   --   --   --    < > 0.21* 0.58 0.13*  ?CREATININE 1.02*  --   --   --   --   --   --  1.11* 1.01*  ?TROPONINIHS 791*  --   --  1,294*  --   --   --   --   --   ? < > = values in this interval not displayed.  ? ? ?Estimated Creatinine Clearance: 34.4 mL/min (A) (by C-G formula based on SCr of 1.01 mg/dL (H)). ? ? ?Assessment: ?67 yof with NSTEMI and acute decompensated HF. No anticoagulation prior to admission. Pharmacy consulted for heparin.   ?  ?Heparin level down to subtherapeutic (0.13) on infusion at 650 units/hr. No issues with line or bleeding reported per RN.  ? ?Goal of Therapy:  ?Heparin level 0.3-0.7 units/ml ?Monitor platelets by anticoagulation protocol: Yes ?  ?Plan:  ?Increase heparin infusion to 750 units/hr ?F/u 8hr heparin level ? ?Sherlon Handing, PharmD, BCPS ?Please see amion for complete clinical pharmacist phone list ?11/04/2021 2:32 AM ? ?

## 2021-11-05 ENCOUNTER — Encounter (HOSPITAL_COMMUNITY): Payer: Self-pay | Admitting: Interventional Cardiology

## 2021-11-05 ENCOUNTER — Encounter (HOSPITAL_COMMUNITY)
Admission: EM | Disposition: A | Discharge status: Home / Self Care | Payer: Self-pay | Source: Home / Self Care | Attending: Internal Medicine

## 2021-11-05 ENCOUNTER — Other Ambulatory Visit (HOSPITAL_COMMUNITY): Payer: Self-pay

## 2021-11-05 DIAGNOSIS — J449 Chronic obstructive pulmonary disease, unspecified: Secondary | ICD-10-CM | POA: Diagnosis not present

## 2021-11-05 DIAGNOSIS — I5021 Acute systolic (congestive) heart failure: Secondary | ICD-10-CM | POA: Diagnosis not present

## 2021-11-05 DIAGNOSIS — I214 Non-ST elevation (NSTEMI) myocardial infarction: Secondary | ICD-10-CM | POA: Diagnosis not present

## 2021-11-05 DIAGNOSIS — E871 Hypo-osmolality and hyponatremia: Secondary | ICD-10-CM | POA: Diagnosis not present

## 2021-11-05 DIAGNOSIS — I2510 Atherosclerotic heart disease of native coronary artery without angina pectoris: Secondary | ICD-10-CM

## 2021-11-05 HISTORY — PX: RIGHT/LEFT HEART CATH AND CORONARY ANGIOGRAPHY: CATH118266

## 2021-11-05 LAB — HEPARIN LEVEL (UNFRACTIONATED): Heparin Unfractionated: 0.45 IU/mL (ref 0.30–0.70)

## 2021-11-05 LAB — BASIC METABOLIC PANEL
Anion gap: 8 (ref 5–15)
BUN: 30 mg/dL — ABNORMAL HIGH (ref 8–23)
CO2: 29 mmol/L (ref 22–32)
Calcium: 8.4 mg/dL — ABNORMAL LOW (ref 8.9–10.3)
Chloride: 97 mmol/L — ABNORMAL LOW (ref 98–111)
Creatinine, Ser: 1.24 mg/dL — ABNORMAL HIGH (ref 0.44–1.00)
GFR, Estimated: 48 mL/min — ABNORMAL LOW (ref >60)
Glucose, Bld: 99 mg/dL (ref 70–99)
Potassium: 4 mmol/L (ref 3.5–5.1)
Sodium: 134 mmol/L — ABNORMAL LOW (ref 135–145)

## 2021-11-05 LAB — POCT I-STAT EG7
Acid-Base Excess: 6 mmol/L — ABNORMAL HIGH (ref 0.0–2.0)
Acid-Base Excess: 7 mmol/L — ABNORMAL HIGH (ref 0.0–2.0)
Bicarbonate: 31.9 mmol/L — ABNORMAL HIGH (ref 20.0–28.0)
Bicarbonate: 33.4 mmol/L — ABNORMAL HIGH (ref 20.0–28.0)
Calcium, Ion: 1.16 mmol/L (ref 1.15–1.40)
Calcium, Ion: 1.17 mmol/L (ref 1.15–1.40)
HCT: 35 % — ABNORMAL LOW (ref 36.0–46.0)
HCT: 35 % — ABNORMAL LOW (ref 36.0–46.0)
Hemoglobin: 11.9 g/dL — ABNORMAL LOW (ref 12.0–15.0)
Hemoglobin: 11.9 g/dL — ABNORMAL LOW (ref 12.0–15.0)
O2 Saturation: 65 %
O2 Saturation: 66 %
Potassium: 3.5 mmol/L (ref 3.5–5.1)
Potassium: 3.5 mmol/L (ref 3.5–5.1)
Sodium: 137 mmol/L (ref 135–145)
Sodium: 137 mmol/L (ref 135–145)
TCO2: 33 mmol/L — ABNORMAL HIGH (ref 22–32)
TCO2: 35 mmol/L — ABNORMAL HIGH (ref 22–32)
pCO2, Ven: 49.8 mmHg (ref 44–60)
pCO2, Ven: 52.1 mmHg (ref 44–60)
pH, Ven: 7.415 (ref 7.25–7.43)
pH, Ven: 7.415 (ref 7.25–7.43)
pO2, Ven: 34 mmHg (ref 32–45)
pO2, Ven: 35 mmHg (ref 32–45)

## 2021-11-05 LAB — POCT I-STAT 7, (LYTES, BLD GAS, ICA,H+H)
Acid-Base Excess: 5 mmol/L — ABNORMAL HIGH (ref 0.0–2.0)
Bicarbonate: 30.3 mmol/L — ABNORMAL HIGH (ref 20.0–28.0)
Calcium, Ion: 1.16 mmol/L (ref 1.15–1.40)
HCT: 33 % — ABNORMAL LOW (ref 36.0–46.0)
Hemoglobin: 11.2 g/dL — ABNORMAL LOW (ref 12.0–15.0)
O2 Saturation: 96 %
Potassium: 3.5 mmol/L (ref 3.5–5.1)
Sodium: 135 mmol/L (ref 135–145)
TCO2: 32 mmol/L (ref 22–32)
pCO2 arterial: 47 mmHg (ref 32–48)
pH, Arterial: 7.418 (ref 7.35–7.45)
pO2, Arterial: 81 mmHg — ABNORMAL LOW (ref 83–108)

## 2021-11-05 LAB — CBC
HCT: 34.9 % — ABNORMAL LOW (ref 36.0–46.0)
Hemoglobin: 11.8 g/dL — ABNORMAL LOW (ref 12.0–15.0)
MCH: 30.5 pg (ref 26.0–34.0)
MCHC: 33.8 g/dL (ref 30.0–36.0)
MCV: 90.2 fL (ref 80.0–100.0)
Platelets: 126 10*3/uL — ABNORMAL LOW (ref 150–400)
RBC: 3.87 MIL/uL (ref 3.87–5.11)
RDW: 13.3 % (ref 11.5–15.5)
WBC: 9.3 10*3/uL (ref 4.0–10.5)
nRBC: 0 % (ref 0.0–0.2)

## 2021-11-05 LAB — MAGNESIUM: Magnesium: 2 mg/dL (ref 1.7–2.4)

## 2021-11-05 LAB — PROTIME-INR
INR: 1 (ref 0.8–1.2)
Prothrombin Time: 12.8 seconds (ref 11.4–15.2)

## 2021-11-05 SURGERY — RIGHT/LEFT HEART CATH AND CORONARY ANGIOGRAPHY
Anesthesia: LOCAL

## 2021-11-05 MED ORDER — HEPARIN SODIUM (PORCINE) 1000 UNIT/ML IJ SOLN
INTRAMUSCULAR | Status: AC
Start: 2021-11-05 — End: ?
  Filled 2021-11-05: qty 10

## 2021-11-05 MED ORDER — HEPARIN (PORCINE) IN NACL 1000-0.9 UT/500ML-% IV SOLN
INTRAVENOUS | Status: AC
Start: 2021-11-05 — End: ?
  Filled 2021-11-05: qty 500

## 2021-11-05 MED ORDER — EMPAGLIFLOZIN 10 MG PO TABS: 10.0000 mg | ORAL_TABLET | Freq: Every day | ORAL | Status: DC

## 2021-11-05 MED ORDER — SODIUM CHLORIDE 0.9 % IV SOLN: INTRAVENOUS | Status: AC

## 2021-11-05 MED ORDER — LABETALOL HCL 5 MG/ML IV SOLN: 10.0000 mg | INTRAVENOUS | Status: AC | PRN

## 2021-11-05 MED ORDER — ONDANSETRON HCL 4 MG/2ML IJ SOLN: 4.0000 mg | Freq: Four times a day (QID) | INTRAMUSCULAR | Status: DC | PRN

## 2021-11-05 MED ORDER — SODIUM CHLORIDE 0.9 % IV SOLN: 250.0000 mL | INTRAVENOUS | Status: DC | PRN

## 2021-11-05 MED ORDER — MIDAZOLAM HCL 2 MG/2ML IJ SOLN
INTRAMUSCULAR | Status: AC
  Filled 2021-11-05: qty 2

## 2021-11-05 MED ORDER — FENTANYL CITRATE (PF) 100 MCG/2ML IJ SOLN
INTRAMUSCULAR | Status: AC
  Filled 2021-11-05: qty 2

## 2021-11-05 MED ORDER — MIDAZOLAM HCL 2 MG/2ML IJ SOLN
INTRAMUSCULAR | Status: DC | PRN
  Administered 2021-11-05: .5 mg via INTRAVENOUS

## 2021-11-05 MED ORDER — SODIUM CHLORIDE 0.9 % IV SOLN: INTRAVENOUS | Status: DC

## 2021-11-05 MED ORDER — HEPARIN (PORCINE) IN NACL 1000-0.9 UT/500ML-% IV SOLN
INTRAVENOUS | Status: DC | PRN
  Administered 2021-11-05 (×2): 500 mL

## 2021-11-05 MED ORDER — FENTANYL CITRATE (PF) 100 MCG/2ML IJ SOLN
INTRAMUSCULAR | Status: DC | PRN
  Administered 2021-11-05: 25 ug via INTRAVENOUS

## 2021-11-05 MED ORDER — HEPARIN SODIUM (PORCINE) 5000 UNIT/ML IJ SOLN: 5000.0000 [IU] | Freq: Three times a day (TID) | INTRAMUSCULAR | Status: DC

## 2021-11-05 MED ORDER — ATORVASTATIN CALCIUM 80 MG PO TABS
80.0000 mg | ORAL_TABLET | Freq: Every day | ORAL | Status: DC
  Filled 2021-11-05: qty 1

## 2021-11-05 MED ORDER — IOHEXOL 350 MG/ML SOLN
INTRAVENOUS | Status: DC | PRN
  Administered 2021-11-05: 60 mL

## 2021-11-05 MED ORDER — HEPARIN (PORCINE) IN NACL 1000-0.9 UT/500ML-% IV SOLN
INTRAVENOUS | Status: AC
  Filled 2021-11-05: qty 500

## 2021-11-05 MED ORDER — SODIUM CHLORIDE 0.9% FLUSH
3.0000 mL | Freq: Two times a day (BID) | INTRAVENOUS | Status: DC
  Administered 2021-11-05 – 2021-11-06 (×2): 3 mL via INTRAVENOUS

## 2021-11-05 MED ORDER — HEPARIN SODIUM (PORCINE) 1000 UNIT/ML IJ SOLN
INTRAMUSCULAR | Status: DC | PRN
  Administered 2021-11-05: 2500 [IU] via INTRAVENOUS

## 2021-11-05 MED ORDER — HYDRALAZINE HCL 20 MG/ML IJ SOLN: 10.0000 mg | Freq: Four times a day (QID) | INTRAMUSCULAR | Status: DC | PRN

## 2021-11-05 MED ORDER — VERAPAMIL HCL 2.5 MG/ML IV SOLN
INTRAVENOUS | Status: DC | PRN
  Administered 2021-11-05: 10 mL via INTRA_ARTERIAL

## 2021-11-05 MED ORDER — ENOXAPARIN SODIUM 30 MG/0.3ML IJ SOSY
30.0000 mg | PREFILLED_SYRINGE | INTRAMUSCULAR | Status: DC
  Administered 2021-11-05: 30 mg via SUBCUTANEOUS
  Filled 2021-11-05: qty 0.3

## 2021-11-05 MED ORDER — ACETAMINOPHEN 325 MG PO TABS: 650.0000 mg | ORAL_TABLET | ORAL | Status: DC | PRN

## 2021-11-05 MED ORDER — LOSARTAN POTASSIUM 50 MG PO TABS
50.0000 mg | ORAL_TABLET | Freq: Every day | ORAL | Status: DC
Start: 2021-11-05 — End: 2021-11-06
  Administered 2021-11-05 – 2021-11-06 (×2): 50 mg via ORAL
  Filled 2021-11-05 (×2): qty 1

## 2021-11-05 MED ORDER — VERAPAMIL HCL 2.5 MG/ML IV SOLN
INTRAVENOUS | Status: AC
  Filled 2021-11-05: qty 2

## 2021-11-05 MED ORDER — SODIUM CHLORIDE 0.9% FLUSH: 3.0000 mL | INTRAVENOUS | Status: DC | PRN

## 2021-11-05 MED ORDER — HYDRALAZINE HCL 20 MG/ML IJ SOLN: 10.0000 mg | INTRAMUSCULAR | Status: AC | PRN

## 2021-11-05 MED ORDER — LIDOCAINE HCL (PF) 1 % IJ SOLN
INTRAMUSCULAR | Status: DC | PRN
  Administered 2021-11-05 (×2): 2 mL

## 2021-11-05 MED ORDER — LIDOCAINE HCL (PF) 1 % IJ SOLN
INTRAMUSCULAR | Status: AC
  Filled 2021-11-05: qty 30

## 2021-11-05 SURGICAL SUPPLY — 13 items
BAND ZEPHYR COMPRESS 30 LONG (HEMOSTASIS) ×1 IMPLANT
CATH 5FR JR4 DIAGNOSTIC (CATHETERS) ×1 IMPLANT
CATH BALLN WEDGE 5F 110CM (CATHETERS) ×1 IMPLANT
CATH JL3.5 FR DIAG (CATHETERS) ×1 IMPLANT
GLIDESHEATH SLEND A-KIT 6F 22G (SHEATH) ×1 IMPLANT
GUIDEWIRE .025 260CM (WIRE) ×1 IMPLANT
GUIDEWIRE INQWIRE 1.5J.035X260 (WIRE) IMPLANT
INQWIRE 1.5J .035X260CM (WIRE) ×2
KIT HEART LEFT (KITS) ×2 IMPLANT
PACK CARDIAC CATHETERIZATION (CUSTOM PROCEDURE TRAY) ×2 IMPLANT
SHEATH GLIDE SLENDER 4/5FR (SHEATH) ×1 IMPLANT
TRANSDUCER W/STOPCOCK (MISCELLANEOUS) ×2 IMPLANT
TUBING CIL FLEX 10 FLL-RA (TUBING) ×2 IMPLANT

## 2021-11-05 NOTE — Interval H&P Note (Signed)
Cath Lab Visit (complete for each Cath Lab visit) ? ?Clinical Evaluation Leading to the Procedure:  ? ?ACS: Yes.   ? ?Non-ACS:   ? ?Anginal Classification: CCS III ? ?Anti-ischemic medical therapy: Minimal Therapy (1 class of medications) ? ?Non-Invasive Test Results: No non-invasive testing performed ? ?Prior CABG: No previous CABG ? ? ? ? ? ?History and Physical Interval Note: ? ?11/05/2021 ?7:44 AM ? ?Christie Moreno  has presented today for surgery, with the diagnosis of HF, Nstemi.  The various methods of treatment have been discussed with the patient and family. After consideration of risks, benefits and other options for treatment, the patient has consented to  Procedure(s): ?RIGHT/LEFT HEART CATH AND CORONARY ANGIOGRAPHY (N/A) as a surgical intervention.  The patient's history has been reviewed, patient examined, no change in status, stable for surgery.  I have reviewed the patient's chart and labs.  Questions were answered to the patient's satisfaction.   ? ? ?Belva Crome III ? ? ?

## 2021-11-05 NOTE — CV Procedure (Signed)
40% mid circumflex proximal to 75 % stenosis of of circumflex after origin of second obtuse marginal.  Second obtuse marginal contains 40% stenosis.  [place] 111 bifurcation stenosis. ?First diagonal 50 to 70% ostial.  LAD widely patent ?Left main widely patent ?Dominant with diffuse calcification and luminal irregularities proximal to distal. ?Mild pulmonary hypertension with mean PA pressure 24 mmHg.  Mean right atrial pressure 6 mmHg.  LVEDP 26 mmHg.  Mean wedge pressure 19 mmHg.  O2 saturation 96%.  PA saturation 66%.  Cardiac output 3.65 with index 2.76 L/min/m?Marland Kitchen ?

## 2021-11-05 NOTE — Progress Notes (Signed)
Mobility Specialist Progress Note ? ? 11/05/21 1601  ?Mobility  ?Activity Ambulated with assistance in room  ?Level of Assistance Minimal assist, patient does 75% or more  ?Assistive Device  ?(HHA)  ?Distance Ambulated (ft) 72 ft  ?Activity Response Tolerated well  ?$Mobility charge 1 Mobility  ? ?Pre Mobility: 74 HR, 127/75 BP, 97% SpO2 ?During Mobility: 89 HR, 94% SpO2 ?Post Mobility: 79 HR, 143/80 BP, 97% SpO2 ? ?Session limited to radial band restrictions (unable to move R arm for 24 hrs) but pt having no present complaints and agreeable. Ambulated along bedside d/t pt instability and anxiousness, x4 LOB observed and corrected with minA. Pt would benefit from using AD next session to increase stability and ambulation distance. Pt also complained of soreness on the bottom of her feet, stated they subsided after second bout of ambulation. Returned back to bed w/o fault, call bell placed in reach and RN present.  ? ?Holland Falling ?Mobility Specialist ?Phone Number [phone] ? ?

## 2021-11-05 NOTE — Progress Notes (Addendum)
? ?Progress Note ? ?Patient Name: Christie Moreno ?Date of Encounter: 11/05/2021 ? ?[facility] Cardiologist: Dr. Quay Burow ? ?Subjective  ? ?Feels clinically improved this morning.  Breathing better.  Diuresed 2300 cc. ? ?Inpatient Medications  ?  ?Scheduled Meds: ? aspirin EC  81 mg Oral Daily  ? atorvastatin  40 mg Oral q1800  ? budesonide (PULMICORT) nebulizer solution  0.25 mg Nebulization BID  ? carvedilol  6.25 mg Oral BID WC  ? furosemide  40 mg Intravenous Daily  ? guaiFENesin  600 mg Oral BID  ? heparin  5,000 Units Subcutaneous Q8H  ? ipratropium-albuterol  3 mL Nebulization BID  ? lamoTRIgine  100 mg Oral BID  ? nicotine  14 mg Transdermal Daily  ? sodium chloride flush  3 mL Intravenous Q12H  ? sodium chloride flush  3 mL Intravenous Q12H  ? sodium chloride flush  3 mL Intravenous Q12H  ? ?Continuous Infusions: ? sodium chloride    ? sodium chloride    ? sodium chloride    ? ?PRN Meds: ?sodium chloride, sodium chloride, acetaminophen, acetaminophen, albuterol, hydrALAZINE, hydrALAZINE, labetalol, nitroGLYCERIN, ondansetron (ZOFRAN) IV, sodium chloride flush, sodium chloride flush  ? ?Vital Signs  ?  ?Vitals:  ? 11/04/21 2322 11/05/21 0319 11/05/21 0609 11/05/21 0900  ?BP: (!) 160/91 (!) 164/89 (!) 173/90   ?Pulse: 74 84 79   ?Resp: _0 ?Temp: 98 ?F (36.7 ?C) 98.1 ?F (36.7 ?C)  98.1 ?F (36.7 ?C)  ?TempSrc: Oral Oral  Oral  ?SpO2: 96% 95% 93%   ?Weight:  38.3 kg    ?Height:      ? ? ?Intake/Output Summary (Last 24 hours) at 11/05/2021 0954 ?Last data filed at 11/05/2021 0930 ?Gross per 24 hour  ?Intake 864.9 ml  ?Output 1800 ml  ?Net -935.1 ml  ? ? ?  11/05/2021  ?  3:19 AM 11/04/2021  ?  4:00 AM 11/03/2021  ?  3:21 PM  ?Last 3 Weights  ?Weight (lbs) 84 lb 8 oz 85 lb 8 oz 88 lb 13.5 oz  ?Weight (kg) 38.329 kg 38.783 kg 40.3 kg  ?   ? ?Telemetry  ?  ?Sinus rhythm- Personally Reviewed ? ?ECG  ?  ?Not performed today- Personally Reviewed ? ?Physical Exam  ? ?GEN: No acute distress.   ?Neck: No  JVD ?Cardiac: RRR, no murmurs, rubs, or gallops.  ?Respiratory: Clear to auscultation bilaterally. ?GI: Soft, nontender, non-distended  ?MS: No edema; No deformity. ?Neuro:  Nonfocal  ?Psych: Normal affect  ? ?Labs  ?  ?High Sensitivity Troponin:   ?Recent Labs  ?Lab 11/02/21 ?0431 11/02/21 ?0841  ?TROPONINIHS 791* 1,294*  ?   ?Chemistry ?Recent Labs  ?Lab 11/03/21 ?0823 11/04/21 ?4967 11/05/21 ?0114  ?NA 138 133* 134*  ?K 3.7 4.1 4.0  ?CL 98 95* 97*  ?CO2 32 30 29  ?GLUCOSE 86 113* 99  ?BUN 29* 28* 30*  ?CREATININE 1.11* 1.01* 1.24*  ?CALCIUM 7.8* 8.1* 8.4*  ?MG  --   --  2.0  ?PROT 5.3* 5.1*  --   ?ALBUMIN 2.9* 2.7*  --   ?AST 116* 56*  --   ?ALT 145* 111*  --   ?ALKPHOS 71 67  --   ?BILITOT 0.5 0.2*  --   ?GFRNONAA 54* >60 48*  ?ANIONGAP _1 ?  ?Lipids  ?Recent Labs  ?Lab 11/02/21 ?1122  ?CHOL 168  ?TRIG 15  ?HDL 99  ?[place] 66  ?CHOLHDL 1.7  ?  ?  Hematology ?Recent Labs  ?Lab 11/03/21 ?0823 11/04/21 ?8786 11/05/21 ?0114  ?WBC 8.9 7.4 9.3  ?RBC 3.71* 3.90 3.87  ?HGB 11.2* 11.8* 11.8*  ?HCT 33.9* 35.0* 34.9*  ?MCV 91.4 89.7 90.2  ?MCH 30.2 30.3 30.5  ?MCHC 33.0 33.7 33.8  ?RDW 13.6 13.4 13.3  ?PLT 107* 119* 126*  ? ?Thyroid  ?Recent Labs  ?Lab 11/02/21 ?1122  ?TSH 2.025  ?  ?BNP ?Recent Labs  ?Lab 11/02/21 ?7672  ?BNP 2,444.1*  ?  ?DDimer No results for input(s): DDIMER in the last 168 hours.  ? ?Radiology  ?  ?CARDIAC CATHETERIZATION ? ?Result Date: 11/05/2021 ?CONCLUSIONS Widely patent left main Widely patent LAD wrapping around the left ventricular apex.  Large first diagonal contains calcified eccentric 70 to 80% stenosis. [place] 111 mid circumflex bifurcation stenosis with with the second OM containing 30, 75, 40% stenoses. Diffuse irregularities up to 30% proximal to distal RCA.  PDA 30 to 40%. LVEDP 26 mmHg and in conjunction with mildly reduced LVEF of 45% by echo, acute on chronic combined systolic and diastolic heart failure is felt to be present. Mild pulmonary hypertension with mean PA pressure 24  mmHg and felt to be WHO group 2. Normal cardiac index 2.76 L/min/m?; PA O2 saturation 66%; AO saturation 96%; capillary wedge pressure mean 19 mmHg. RECOMMENDATION Guideline directed medical therapy for acute on chronic combined systolic and diastolic heart failure.  Considerations would be SGLT2, diuresis as needed, plus minus Entresto. Aggressive risk factor modification for coronary artery disease.  ? ?US Abdomen Limited RUQ (LIVER/GB) ? ?Result Date: 11/03/2021 ?CLINICAL DATA:  Elevated liver function tests EXAM: ULTRASOUND ABDOMEN LIMITED RIGHT UPPER QUADRANT COMPARISON:  Same day CT FINDINGS: Gallbladder: Minimal layering sludge within the gallbladder. No gallstones or wall thickening visualized. No sonographic Murphy sign noted by sonographer. Common bile duct: Diameter: 2 mm. Liver: No focal lesion identified. Within normal limits in parenchymal echogenicity. Portal vein is patent on color Doppler imaging with normal direction of blood flow towards the liver. Other: None. IMPRESSION: 1. Unremarkable sonographic appearance of the liver. 2. Minimal layering sludge within the gallbladder. No gallstone or evidence of cholecystitis. Electronically Signed   By: Davina Poke D.O.   On: 11/03/2021 10:40   ? ?Cardiac Studies  ? ? ? ?IMPRESSIONS  ? ? ? 1. Apical hypokinesis with remaining walls hypokinetic; overall mild to  ?moderate LV dysfunction; calcified aortic valve but no AS by doppler;  ?moderate MAC with mild mitral stenosis.  ? 2. Left ventricular ejection fraction, by estimation, is 40 to 45%. The  ?left ventricle has mild to moderately decreased function. The left  ?ventricle demonstrates regional wall motion abnormalities (see scoring  ?diagram/findings for description). There is  ? mild left ventricular hypertrophy. Left ventricular diastolic parameters  ?are consistent with Grade I diastolic dysfunction (impaired relaxation).  ?Elevated left atrial pressure.  ? 3. Right ventricular systolic function  is normal. The right ventricular  ?size is normal.  ? 4. The mitral valve is normal in structure. Trivial mitral valve  ?regurgitation. Mild mitral stenosis. Moderate mitral annular  ?calcification.  ? 5. The aortic valve is calcified. Aortic valve regurgitation is trivial.  ?No aortic stenosis is present.  ? 6. The inferior vena cava is normal in size with greater than 50%  ?respiratory variability, suggesting right atrial pressure of 3 mmHg. ? ?Patient Profile  ?   ?66 y.o. female with a hx of seizure, chronic headache,  chronic hormone therapy, who was seen 11/02/2021 for  the evaluation of NSTEMI at the request of [name], PA ? ?Assessment & Plan  ?  ?1: Non-STEMI-troponins peaked at 1294.  On IV heparin, aspirin and Coreg.  2D echo revealed an EF of 40 to 45% without significant valvular abnormalities.  She is pain-free.  LHC showed non critical CAD. ? ?2: Systolic/diastolic heart failure-patient was admitted with dyspnea.  She does have a history of COPD.  Her BNP was elevated at 2444.  She did have peripheral edema.  She has diuresed nicely 1100 cc with IV Lasix and feels clinically improved.  She is on low-dose carvedilol.  We will plan on adding an ACE/ARB after cardiac catheterization.Filling pressures were mildly elevated. Will start low dose Losartan. Plan repeat 2D in 3 months. Agree with starting a SGLT2 med.  ? ?3: Essential hypertension-on carvedilol.  Blood pressure moderately elevated.  We will continue to add/titrate medications ?   ?She is well diuresed. Would prob transition to PO lasix 40 mg/day. Potential DC tomorrow from our point of view. ? ?For questions or updates, please contact [place] ?Please consult [email or web address] for contact info under  ? ?  ?   ?Signed, ?Quay Burow, MD  ?11/05/2021, 9:54 AM   ? ?

## 2021-11-05 NOTE — Progress Notes (Addendum)
?Progress Note ? ? ?Patient: Christie Moreno QIH:[id number] DOB: 1956-01-15 DOA: 11/02/2021     3 ?DOS: the patient was seen and examined on 11/05/2021 ?  ?Brief hospital course: ?Christie Moreno was admitted to the hospital with the working diagnosis of decompensated heart failure.  ? ?66 yo female with the past medical history of seizures. She reported few weeks of progressive dyspnea. Lately associated with PND and lower extremity edema. On the day of hospitalization she had a severe episode of PND, at 3:00 am and decided to come to the ED. As outpatient she was diagnosed with bronchitis and had steroid taper prescribed with no improvement in her symptoms. On her initial physical examination her blood pressure was 148/61, HR 92, RR 19, 02 saturation 99%, heart with S1 and S2 present and rhythmic, lungs with scattered rhonchi and rales bilaterally, no wheezing, positive +++ pitting bilateral lower extremity edema.  ? ?Na 137, K 4.0, CL 105, bicarbonate 21, glucose 167, bun 24, cr 1.0  ?High sensitive troponin 791  ?Wbc 6.1, hgb 13,7 hct 40,7 plt 120  ?TSH 6.5  ?Sars covid 19 negative ? ?Chest radiograph with hyperinflation with bilateral hilar vascular congestion, no infiltrates.  ? ?EKG 104 bpm, right axis deviation, Qtc 515, sinus rhythm, with no significant ST segment or T wave changes.  ? ?Patient was placed on IV heparin for NSTEMI.  ?Placed on furosemide for diuresis. ?Echocardiogram with reduced LV systolic function.   ? ?04/06 Cardiac catheterization no significant coronary artery disease. Mean PA pressure 24 mmHg, with wedge pressure 19.  ? ?Assessment and Plan: ?* NSTEMI (non-ST elevated myocardial infarction) ([place]) ?Patient with no further chest pain. ? ?No significant coronary artery disease. ?Heparin drip discontinued. ?Continue medical therapy with B blockade and antiplatelet therapy with aspirin.  ?Statin therapy with atorvastatin.  ? ? ?Acute systolic CHF (congestive heart failure)  ([place]) ?Echocardiogram with mild reduction in LV systolic function to 40 to 45%, apical hypokinesis, with remaining walls hypokinetic, RV with preserved systolic function, trivial pericardial effusion, no significant valvular disease.  ? ?Urine output over last 24 hrs is 1,800 ml ?Continue to improve volume status ?Blood pressure systolic is 875 to 643 mmHg.  ? ?Continue with carvedilol and diuresis with furosemide.  ?Started on losartan, possible transition to entresto as outpatient.  ?Add SGLT2i when renal function improves.  ? ?Hyponatremia ?Hyponatremia and hypokalemia.  ? ?Stable renal function with serum cr at 1,24 with K at 4,0 and serum bicarbonate at 29. Na 134 ?Continue diuresis with oral furosemide and follow up electrolytes in am.  ? ?COPD with chronic bronchitis ([place]) ?Continue bronchodilatory therapy ?Steroids have been discontinued. ?Wean off to room air as tolerated, supplemental 02.   ? ?Partial symptomatic epilepsy with complex partial seizures, not intractable, without status epilepticus ([place]) ?Continue with lamotrigine, no active seizures.  ? ?Tobacco dependence ?Continue nicotine patch, smoking cessation.  ? ? ? ? ?  ? ?Subjective: patient is feeling better, dyspnea and edema have improved.  ? ?Physical Exam: ?Vitals:  ? 11/05/21 0609 11/05/21 0900 11/05/21 1206 11/05/21 1647  ?BP: (!) 173/90 (!) 168/84 127/83   ?Pulse: 79 75 69 74  ?Resp: '16 12 15   '$ ?Temp:  98.1 ?F (36.7 ?C) 98.3 ?F (36.8 ?C)   ?TempSrc:  Oral Oral   ?SpO2: 93%  95%   ?Weight:      ?Height:      ? ?Neurology awake and alert ?ENT with mild pallor ?Cardiovascular with S1 and S2 present and  rhythmic, no gallops or murmurs ?No JVD ?No lower extremity edema ?Respiratory with prolonged expiratory phase with no wheezing ?Abdomen not distended  ?Data Reviewed: ? ? ?Family Communication: I spoke with patient's family at the bedside, we talked in detail about patient's condition, plan of care and prognosis and all questions were  addressed. ? ? ?Disposition: ?Status is: Inpatient ?Remains inpatient appropriate because: heart failure  ? Planned Discharge Destination: Home ? ? ? ? ? ?Author: ?Tawni Millers, MD ?11/05/2021 4:49 PM ? ?For on call review [email or web address].  ?

## 2021-11-05 NOTE — Progress Notes (Signed)
Heart Failure Stewardship Pharmacist Progress Note ? ? ?PCP: Pcp, No ?PCP-Cardiologist: None  ? ? ?HPI:  ?66 yo F with PMH of seizures, chronic hormone therapy, and chronic headaches. She presented to the ED on 4/3 with respiratory distress and chest pain. CXR negative for acute cardiopulmonary disease. CTA negative for PE. An ECHO was done on 4/3 and LVEF is 40-45% with mild LVH and G1DD. R/LHC on 4/6 with non-critical CAD, pulmonary hypertension, elevated wedge pressure of 19, and preserved cardiac output.  ? ?Current HF Medications: ?Diuretic: furosemide 40 mg IV daily ?Beta Blocker: carvedilol 6.25 mg BID ?ACE/ARB/ARNI: losartan 50 mg daily ? ?Prior to admission HF Medications: ?None ? ?Pertinent Lab Values: ?Serum creatinine 1.24, BUN 30, Potassium 4.0, Sodium 134, BNP 2444.1, Magnesium 2.0, A1c 5.7  ? ?Vital Signs: ?Weight: 84 lbs (admission weight: 88 lbs) ?Blood pressure: 160/80s  ?Heart rate: 70s  ?I/O: -1.6L yesterday; net -3.7L ? ?Medication Assistance / Insurance Benefits Check: ?Does the patient have prescription insurance?  Yes ?Type of insurance plan: Rx T[employer] ? ?Outpatient Pharmacy:  ?Prior to admission outpatient pharmacy: [facility] ?Is the patient willing to use [facility] pharmacy at discharge? Yes ?Is the patient willing to transition their outpatient pharmacy to utilize a [facility] outpatient pharmacy?   Pending ?  ? ?Assessment: ?1. Acute systolic and diastolic CHF (EF 75-17%), due to NICM. NYHA class III symptoms. ?- Continue furosemide 40 mg IV daily ?- Continue carvedilol 6.25 mg BID ?- Agree with starting ARB - losartan 50 mg daily - consider optimizing to Entresto 24/26 mg BID  ?- Consider adding spironolactone and SGLT2i prior to discharge ?  ?Plan: ?1) Medication changes recommended at this time: ?- Stop losartan and start Entresto 24/26 mg BID (first dose in AM) ?- Start Farxiga 10 mg daily tomorrow  ? ?2) Patient assistance: ?- Cannot run test claims - pharmacy not contracted with  plan ?- VA/DOD benefits  ? ?3)  Education  ?- To be completed prior to discharge ? ?Kerby Nora, PharmD, BCPS ?Heart Failure Stewardship Pharmacist ?Phone [phone] ? ? ?

## 2021-11-06 ENCOUNTER — Other Ambulatory Visit (HOSPITAL_COMMUNITY): Payer: Self-pay

## 2021-11-06 DIAGNOSIS — E43 Unspecified severe protein-calorie malnutrition: Secondary | ICD-10-CM | POA: Diagnosis present

## 2021-11-06 DIAGNOSIS — N179 Acute kidney failure, unspecified: Secondary | ICD-10-CM

## 2021-11-06 LAB — BASIC METABOLIC PANEL
Anion gap: 7 (ref 5–15)
BUN: 30 mg/dL — ABNORMAL HIGH (ref 8–23)
CO2: 30 mmol/L (ref 22–32)
Calcium: 8.4 mg/dL — ABNORMAL LOW (ref 8.9–10.3)
Chloride: 98 mmol/L (ref 98–111)
Creatinine, Ser: 1.07 mg/dL — ABNORMAL HIGH (ref 0.44–1.00)
GFR, Estimated: 57 mL/min — ABNORMAL LOW (ref >60)
Glucose, Bld: 77 mg/dL (ref 70–99)
Potassium: 3.8 mmol/L (ref 3.5–5.1)
Sodium: 135 mmol/L (ref 135–145)

## 2021-11-06 MED ORDER — POTASSIUM CHLORIDE CRYS ER 20 MEQ PO TBCR
20.0000 meq | EXTENDED_RELEASE_TABLET | Freq: Every day | ORAL | 0 refills | Status: DC
Start: 2021-11-06 — End: 2021-12-06
  Filled 2021-11-06 (×2): qty 30, 30d supply, fill #0

## 2021-11-06 MED ORDER — POTASSIUM CHLORIDE CRYS ER 20 MEQ PO TBCR
20.0000 meq | EXTENDED_RELEASE_TABLET | Freq: Every day | ORAL | Status: DC
  Administered 2021-11-06: 20 meq via ORAL
  Filled 2021-11-06: qty 1

## 2021-11-06 MED ORDER — LOSARTAN POTASSIUM 50 MG PO TABS
50.0000 mg | ORAL_TABLET | Freq: Every day | ORAL | 0 refills | Status: DC
  Filled 2021-11-06 (×2): qty 30, 30d supply, fill #0

## 2021-11-06 MED ORDER — ENSURE ENLIVE PO LIQD
237.0000 mL | Freq: Two times a day (BID) | ORAL | 0 refills | Status: AC
  Filled 2021-11-06: qty 237, 1d supply, fill #0

## 2021-11-06 MED ORDER — EMPAGLIFLOZIN 10 MG PO TABS
10.0000 mg | ORAL_TABLET | Freq: Every day | ORAL | 0 refills | Status: DC
  Filled 2021-11-06: qty 14, 14d supply, fill #0

## 2021-11-06 MED ORDER — CARVEDILOL 6.25 MG PO TABS
6.2500 mg | ORAL_TABLET | Freq: Two times a day (BID) | ORAL | 0 refills | Status: DC
Start: 2021-11-06 — End: 2021-11-25
  Filled 2021-11-06 (×2): qty 60, 30d supply, fill #0

## 2021-11-06 MED ORDER — FUROSEMIDE 40 MG PO TABS
40.0000 mg | ORAL_TABLET | Freq: Every day | ORAL | Status: DC
Start: 2021-11-06 — End: 2021-11-06
  Administered 2021-11-06: 40 mg via ORAL
  Filled 2021-11-06: qty 1

## 2021-11-06 MED ORDER — ADULT MULTIVITAMIN W/MINERALS CH
1.0000 | ORAL_TABLET | Freq: Every day | ORAL | Status: DC
  Administered 2021-11-06: 1 via ORAL
  Filled 2021-11-06: qty 1

## 2021-11-06 MED ORDER — EMPAGLIFLOZIN 10 MG PO TABS
10.0000 mg | ORAL_TABLET | Freq: Every day | ORAL | Status: DC
  Administered 2021-11-06: 10 mg via ORAL
  Filled 2021-11-06: qty 1

## 2021-11-06 MED ORDER — IPRATROPIUM-ALBUTEROL 0.5-2.5 (3) MG/3ML IN SOLN
3.0000 mL | Freq: Four times a day (QID) | RESPIRATORY_TRACT | 0 refills | Status: DC | PRN
  Filled 2021-11-06: qty 90, 8d supply, fill #0
  Filled 2021-11-06: qty 360, 30d supply, fill #0

## 2021-11-06 MED ORDER — ATORVASTATIN CALCIUM 80 MG PO TABS
80.0000 mg | ORAL_TABLET | Freq: Every day | ORAL | 0 refills | Status: DC
  Filled 2021-11-06 (×2): qty 30, 30d supply, fill #0

## 2021-11-06 MED ORDER — ASPIRIN 81 MG PO TBEC
81.0000 mg | DELAYED_RELEASE_TABLET | Freq: Every day | ORAL | 0 refills | Status: AC
Start: 2021-11-06 — End: ?
  Filled 2021-11-06 (×2): qty 30, 30d supply, fill #0

## 2021-11-06 MED ORDER — ADULT MULTIVITAMIN W/MINERALS CH
1.0000 | ORAL_TABLET | Freq: Every day | ORAL | 0 refills | Status: AC
Start: 2021-11-06 — End: 2021-12-06
  Filled 2021-11-06: qty 30, 30d supply, fill #0

## 2021-11-06 MED ORDER — FUROSEMIDE 40 MG PO TABS
40.0000 mg | ORAL_TABLET | Freq: Every day | ORAL | 0 refills | Status: DC
Start: 2021-11-06 — End: 2021-11-11
  Filled 2021-11-06 (×2): qty 30, 30d supply, fill #0

## 2021-11-06 MED ORDER — ALBUTEROL SULFATE (2.5 MG/3ML) 0.083% IN NEBU
2.5000 mg | INHALATION_SOLUTION | Freq: Four times a day (QID) | RESPIRATORY_TRACT | 12 refills | Status: DC | PRN
  Filled 2021-11-06 (×2): qty 90, 8d supply, fill #0

## 2021-11-06 MED ORDER — ENSURE ENLIVE PO LIQD: 237.0000 mL | Freq: Two times a day (BID) | ORAL | Status: DC

## 2021-11-06 NOTE — TOC Progression Note (Signed)
Transition of Care (TOC) - Progression Note  ? ? ?Patient Details  ?Name: ZEMIRAH KRASINSKI ?MRN: [id number] ?Date of Birth: [birth year] ? ?Transition of Care (TOC) CM/SW Contact  ?Angelita Ingles, RN ?Phone Number:[phone] ? ?11/06/2021, 10:24 AM ? ?Clinical Narrative:    ?Patient with d/c orders and need for nebulizer machine. Nebulizer machine has been ordered per [employer] and will be delivered to the bedside. ? ? ?  ?  ? ?Expected Discharge Plan and Services ?  ?  ?  ?  ?  ?Expected Discharge Date: 11/06/21               ?  ?  ?  ?  ?  ?  ?  ?  ?  ?  ? ? ?Social Determinants of Health (SDOH) Interventions ?Food Insecurity Interventions: Intervention Not Indicated ?Financial Strain Interventions: Intervention Not Indicated ?Housing Interventions: Intervention Not Indicated ?Transportation Interventions: Intervention Not Indicated ? ?Readmission Risk Interventions ?   ? View : No data to display.  ?  ?  ?  ? ? ?

## 2021-11-06 NOTE — Assessment & Plan Note (Signed)
Continue nutritional supplementation  

## 2021-11-06 NOTE — Progress Notes (Signed)
Initial Nutrition Assessment ? ?DOCUMENTATION CODES:  ? ?Underweight, Severe malnutrition in context of chronic illness ? ?INTERVENTION:  ? ?- Ensure Enlive po BID, each supplement provides 350 kcal and 20 grams of protein ? ?- MVI with minerals daily ? ?- Liberalize diet to Regular ? ?NUTRITION DIAGNOSIS:  ? ?Severe Malnutrition related to chronic illness (CHF, COPD) as evidenced by severe fat depletion, severe muscle depletion. ? ?GOAL:  ? ?Patient will meet greater than or equal to 90% of their needs ? ?MONITOR:  ? ?PO intake, Supplement acceptance, Weight trends, I & O's ? ?REASON FOR ASSESSMENT:  ? ?Consult ?Assessment of nutrition requirement/status ? ?ASSESSMENT:  ? ?66 year old female who presented to the ED on 4/03 with respiratory distress. PMH of seizure, chronic hormone therapy. Pt admitted with NSTEMI, acute decompensated heart failure, COPD with acute exacerbation. ? ?04/06 - s/p R/L heart cath ? ?Spoke with pt at bedside. Pt reports good appetite and states that she eats well when the food is good. Pt does not particularly like the hospital food on the [place] as it lacks flavor. No meal completions charted this admission. Given severe malnutrition, RD to liberalize diet to Regular to provide more meal options with more flavor. ? ?Pt reports typically eating well at home. She reports eating 2-3 meals daily. She eats a lot of fruits, vegetables, whole grains, yogurt, and smoothies. Pt does not eat as much meat as she used to and limits her portions to 4 oz. ? ?Pt endorses weight loss over time. She reports that she used to weigh 175 lbs when she was eating too much. She states that she began to change her eating habits to intentionally lose weight but now believes that she has lost too much weight. She reports that her doctors and significant other think that her weight is too low. Pt reports that she usually weighs between 93-94 lbs. Current weight of 81.57 lbs (37 kg) is well below pt's  UBW range. ? ?Reviewed weight history in chart. Pt with a 3.4 kg weight loss since 08/10/21. This is an 8.4% weight loss in less than 3 months which is significant for timeframe. Difficult to determine how much of weight loss can be attributed to fluid status/diuresis as pt reports losing 10 lbs of fluid since being admitted to the hospital. Pt is net negative 4.4 L since admission. ? ?Pt willing to try consuming oral nutrition supplements both during admission and at home. RD provided strawberry Ensure Plus which pt reports she enjoys but is too sweet. Encouraged pt to mix with whole milk at home and split 1 supplement into 2 doses. Pt in agreement. Pt reports that she has been told to take a MVI with vitamin D but does not do this. RD to order daily MVI with minerals. ? ?Discussed increased calorie and protein needs related to chronic illnesses. Encouraged intake of high-calorie, high-protein foods. ? ?Medications reviewed and include: jardiance, lasix, klor-con 20 mEq daily ? ?Labs reviewed: BUN 30, creatinine 1.07, elevated LFTs, platelets 126 ? ?UOP: 2800 ml x 24 hours ?I/O's: -4.4 L since admit ? ?NUTRITION - FOCUSED PHYSICAL EXAM: ? ?Flowsheet Row Most Recent Value  ?Orbital Region Severe depletion  ?Upper Arm Region Severe depletion  ?Thoracic and Lumbar Region Severe depletion  ?Buccal Region Moderate depletion  ?Temple Region Severe depletion  ?Clavicle Bone Region Severe depletion  ?Clavicle and Acromion Bone Region Severe depletion  ?Scapular Bone Region Severe depletion  ?Dorsal Hand Severe depletion  ?Patellar Region Moderate  depletion  ?Anterior Thigh Region Severe depletion  ?Posterior Calf Region Moderate depletion  ?Edema (RD Assessment) None  ?Hair Reviewed  ?Eyes Reviewed  ?Mouth Reviewed  ?Skin Reviewed  ?Nails Reviewed  ? ?  ? ? ?Diet Order:   ?Diet Order   ? ?       ?  Diet regular Room service appropriate? Yes; Fluid consistency: Thin  Diet effective now       ?  ?  Diet - low sodium heart  healthy       ?  ? ?  ?  ? ?  ? ? ?EDUCATION NEEDS:  ? ?Education needs have been addressed ? ?Skin:  Skin Assessment: Reviewed RN Assessment ? ?Last BM:  11/03/21 ? ?Height:  ? ?Ht Readings from Last 1 Encounters:  ?11/03/21 '5\' 2"'$  (1.575 m)  ? ? ?Weight:  ? ?Wt Readings from Last 1 Encounters:  ?11/06/21 37 kg  ? ? ?BMI:  Body mass index is 14.92 kg/m?. ? ?Estimated Nutritional Needs:  ? ?Kcal:  1500-1700 ? ?Protein:  65-75 grams ? ?Fluid:  1.5-1.7 L ? ? ? ?Gustavus Bryant, MS, RD, LDN ?Inpatient Clinical Dietitian ?Please see AMiON for contact information. ? ?

## 2021-11-06 NOTE — Evaluation (Signed)
Physical Therapy Evaluation ?Patient Details ?Name: Christie Moreno ?MRN: [id number] ?DOB: [birth year] ?Today's Date: 11/06/2021 ? ?History of Present Illness ? 66 yo admitted 4/3 with SOB, respiratory distress, NSTEMI and acute decompensated HF. 4/6 radial heart cath. PMhx: Sz, COPD, tobacco dependence, anxiety  ?Clinical Impression ? Pt pleasant and able to tolerate hall ambulation and stairs but reports fatigue and decreased balance. Pt normally very independent and reports no falls. Pt with decreased balance, activity tolerance and safety who will benefit from acute therapy to maximize mobility and safety.  ? ?BP pre 132/62, post gait 125/104 ?HR 70-84 ?SpO2 90-97% on RA   ?   ? ?Recommendations for follow up therapy are one component of a multi-disciplinary discharge planning process, led by the attending physician.  Recommendations may be updated based on patient status, additional functional criteria and insurance authorization. ? ?Follow Up Recommendations Outpatient PT ? ?  ?Assistance Recommended at Discharge None  ?Patient can return home with the following ? A little help with walking and/or transfers;Assistance with cooking/housework ? ?  ?Equipment Recommendations Rolling walker (2 wheels)  ?Recommendations for Other Services ?    ?  ?Functional Status Assessment Patient has had a recent decline in their functional status and demonstrates the ability to make significant improvements in function in a reasonable and predictable amount of time.  ? ?  ?Precautions / Restrictions Precautions ?Precautions: Fall ?Precaution Comments: Rt radial cath 4/6 ?Restrictions ?Weight Bearing Restrictions: No  ? ?  ? ?Mobility ? Bed Mobility ?  ?  ?  ?  ?  ?  ?  ?General bed mobility comments: in chair on arrival and end of session ?  ? ?Transfers ?Overall transfer level: Needs assistance ?  ?Transfers: Sit to/from Stand ?Sit to Stand: Min guard ?  ?  ?  ?  ?  ?General transfer comment: cues for safety and not to push  with RUE ?  ? ?Ambulation/Gait ?Ambulation/Gait assistance: Min guard ?Gait Distance (Feet): 300 Feet ?Assistive device: Rolling walker (2 wheels) ?Gait Pattern/deviations: Step-through pattern, Decreased stride length ?  ?Gait velocity interpretation: 1.31 - 2.62 ft/sec, indicative of limited community ambulator ?  ?General Gait Details: pt without Rw for initial gait with posterior LOB and unsteady gait with physical assist to recover. Use of RW after 5' with one additional LOB needing assist. Pt with cues for safety and decreased pressure with RUE ? ?Stairs ?Stairs: Yes ?Stairs assistance: Supervision ?Stair Management: Alternating pattern, Forwards, One rail Left ?Number of Stairs: 6 ?General stair comments: pt able to ascend and descend with LUE on railing with cues for safety and no LOB ? ?Wheelchair Mobility ?  ? ?Modified Rankin (Stroke Patients Only) ?  ? ?  ? ?Balance Overall balance assessment: Needs assistance ?  ?Sitting balance-Leahy Scale: Good ?  ?  ?Standing balance support: No upper extremity supported, During functional activity ?Standing balance-Leahy Scale: Fair ?Standing balance comment: LOB with dynamic movement needing RW for support ?  ?  ?  ?  ?  ?  ?  ?  ?  ?  ?  ?   ? ? ? ?Pertinent Vitals/Pain Pain Assessment ?Pain Assessment: No/denies pain  ? ? ?Home Living Family/patient expects to be discharged to:: Private residence ?Living Arrangements: Spouse/significant other ?Available Help at Discharge: Family;Available 24 hours/day ?Type of Home: House ?Home Access: Stairs to enter ?Entrance Stairs-Rails: Right ?Entrance Stairs-Number of Steps: 6 ?  ?Home Layout: One level ?Home Equipment: None ?   ?  ?Prior  Function Prior Level of Function : Independent/Modified Independent ?  ?  ?  ?  ?  ?  ?  ?ADLs Comments: ADLs and IADLs ?  ? ? ?Hand Dominance  ? Dominant Hand: Right ? ?  ?Extremity/Trunk Assessment  ? Upper Extremity Assessment ?Upper Extremity Assessment: Generalized weakness ?   ? ?Lower Extremity Assessment ?Lower Extremity Assessment: Generalized weakness ?  ? ?Cervical / Trunk Assessment ?Cervical / Trunk Assessment: Kyphotic  ?Communication  ? Communication: No difficulties  ?Cognition Arousal/Alertness: Awake/alert ?Behavior During Therapy: [facility] for tasks assessed/performed ?Overall Cognitive Status: Impaired/Different from baseline ?Area of Impairment: Memory ?  ?  ?  ?  ?  ?  ?  ?  ?  ?  ?Memory: Decreased short-term memory ?  ?  ?  ?Problem Solving: Slow processing ?General Comments: decreased recall of post cath restrictions, decreased recall with some STM ?  ?  ? ?  ?General Comments General comments (skin integrity, edema, etc.): HR 86. SpO2 94% on RA. ? ?  ?Exercises    ? ?Assessment/Plan  ?  ?PT Assessment Patient needs continued PT services  ?PT Problem List Decreased mobility;Decreased activity tolerance;Decreased balance;Decreased knowledge of use of DME ? ?   ?  ?PT Treatment Interventions Gait training;Balance training;Functional mobility training;Therapeutic activities;Patient/family education;DME instruction;Therapeutic exercise;Stair training   ? ?PT Goals (Current goals can be found in the Care Plan section)  ?Acute Rehab PT Goals ?Patient Stated Goal: return home ?PT Goal Formulation: With patient ?Time For Goal Achievement: 11/20/21 ?Potential to Achieve Goals: Good ? ?  ?Frequency Min 3X/week ?  ? ? ?Co-evaluation   ?  ?  ?  ?  ? ? ?  ?AM-PAC PT "6 Clicks" Mobility  ?Outcome Measure Help needed turning from your back to your side while in a flat bed without using bedrails?: None ?Help needed moving from lying on your back to sitting on the side of a flat bed without using bedrails?: A Little ?Help needed moving to and from a bed to a chair (including a wheelchair)?: A Little ?Help needed standing up from a chair using your arms (e.g., wheelchair or bedside chair)?: A Little ?Help needed to walk in hospital room?: A Little ?Help needed climbing 3-5 steps with a  railing? : A Little ?6 Click Score: 19 ? ?  ?End of Session   ?Activity Tolerance: Patient tolerated treatment well ?Patient left: in chair;with call bell/phone within reach ?Nurse Communication: Mobility status ?PT Visit Diagnosis: Other abnormalities of gait and mobility (R26.89);Difficulty in walking, not elsewhere classified (R26.2) ?  ? ?Time: 3664-4034 ?PT Time Calculation (min) (ACUTE ONLY): 24 min ? ? ?Charges:   PT Evaluation ?$PT Eval Moderate Complexity: 1 Mod ?PT Treatments ?$Gait Training: 8-22 mins ?  ?   ? ? ?Jackson Fetters P, PT ?Acute Rehabilitation Services ?Pager: [phone] ?Office: [phone] ? ? ?Juventino Pavone B Taneka Espiritu ?11/06/2021, 12:54 PM ? ?

## 2021-11-06 NOTE — Care Management Important Message (Signed)
Important Message ? ?Patient Details  ?Name: Christie Moreno ?MRN: [id number] ?Date of Birth: May 27, 1956 ? ? ?Medicare Important Message Given:  Yes ? ? ? ? ?Hoa Briggs ?11/06/2021, 12:05 PM ?

## 2021-11-06 NOTE — Progress Notes (Signed)
Heart Failure Stewardship Pharmacist Progress Note ? ? ?PCP: Pcp, No ?PCP-Cardiologist: None  ? ? ?HPI:  ?66 yo F with PMH of seizures, chronic hormone therapy, and chronic headaches. She presented to the ED on 4/3 with respiratory distress and chest pain. CXR negative for acute cardiopulmonary disease. CTA negative for PE. An ECHO was done on 4/3 and LVEF is 40-45% with mild LVH and G1DD. R/LHC on 4/6 with non-critical CAD, pulmonary hypertension, elevated wedge pressure of 19, and preserved cardiac output.  ? ?Discharge HF Medications: ?Diuretic: furosemide 40 mg PO daily ?Beta Blocker: carvedilol 6.25 mg BID ?ACE/ARB/ARNI: losartan 50 mg daily ?SGLT2i: Jardiance 10 mg daily ? ?Prior to admission HF Medications: ?None ? ?Pertinent Lab Values: ?Serum creatinine 1.07, BUN 30, Potassium 3.8, Sodium 135, BNP 2444.1, Magnesium 2.0, A1c 5.7  ? ?Vital Signs: ?Weight: 81 lbs (admission weight: 88 lbs) ?Blood pressure: 120/70s  ?Heart rate: 70s  ?I/O: -2.7L yesterday; net -4.4L ? ?Medication Assistance / Insurance Benefits Check: ?Does the patient have prescription insurance?  Yes ?Type of insurance plan: Rx T[employer] ? ?Outpatient Pharmacy:  ?Prior to admission outpatient pharmacy: [facility] ?Is the patient willing to use [facility] pharmacy at discharge? Yes ?Is the patient willing to transition their outpatient pharmacy to utilize a [facility] outpatient pharmacy?   Pending ?  ? ?Assessment: ?1. 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 NYHA class III symptoms. ?- Continue furosemide 40 mg PO daily ?- Continue carvedilol 6.25 mg BID ?- Continue losartan 50 mg daily  ?- Consider adding spironolactone at follow up ?- Agree with adding Jardiance 10 mg daily ?  ?Plan: ?1) Medication changes recommended at this time: ?- Agree with changes - discharge today ? ?2) Patient assistance: ?- Cannot run test claims - pharmacy not contracted with plan ?- VA/DOD benefits  ? ?3)  Education  ?- Patient has been educated on  current HF medications and potential additions to HF medication regimen ?- Patient verbalizes understanding that over the next few months, these medication doses may change and more medications may be added to optimize HF regimen ?- Patient has been educated on basic disease state pathophysiology and goals of therapy ? ? ?Kerby Nora, PharmD, BCPS ?Heart Failure Stewardship Pharmacist ?Phone [phone] ? ? ?

## 2021-11-06 NOTE — Care Management Important Message (Signed)
Important Message ? ?Patient Details  ?Name: Christie Moreno ?MRN: [id number] ?Date of Birth: 12-30-1955 ? ? ?Medicare Important Message Given:  Yes ? ? ? ? ?Khadeja Abt ?11/06/2021, 12:03 PM ?

## 2021-11-06 NOTE — Discharge Summary (Addendum)
Physician Discharge Summary   Patient: Christie Moreno MRN: [id number] DOB: Dec 23, 1955  Admit date:     11/02/2021  Discharge date: 11/06/21  Discharge Physician: York Ram Rishita Petron   PCP: Pcp, No   Recommendations at discharge:    Patient has been placed on heart failure regimen including carvedilol, losartan, empagliflozin. Diuresis with furosemide. To consider adding spironolactone and transition to entresto as outpatient.  Smoking cessation counseling Ambulatory oxymetry on room air before her discharge.   Discharge Diagnoses: Principal Problem:   NSTEMI (non-ST elevated myocardial infarction) (HCC) Active Problems:   Acute systolic CHF (congestive heart failure) (HCC)   Hyponatremia   Partial symptomatic epilepsy with complex partial seizures, not intractable, without status epilepticus (HCC)   COPD with chronic bronchitis (HCC)   Tobacco dependence  Resolved Problems:   * No resolved hospital problems. [facility] Course: Christie Moreno was admitted to the hospital with the working diagnosis of decompensated heart failure.   66 yo female with the past medical history of seizures. She reported few weeks of progressive dyspnea. Lately associated with PND and lower extremity edema. On the day of hospitalization she had a severe episode of PND, at 3:00 am and decided to come to the ED. As outpatient she was diagnosed with bronchitis and had steroid taper prescribed with no improvement in her symptoms. On her initial physical examination her blood pressure was 148/61, HR 92, RR 19, 02 saturation 99%, heart with S1 and S2 present and rhythmic, lungs with scattered rhonchi and rales bilaterally, no wheezing, positive +++ pitting bilateral lower extremity edema.   Na 137, K 4.0, CL 105, bicarbonate 21, glucose 167, bun 24, cr 1.0  High sensitive troponin 791  Wbc 6.1, hgb 13,7 hct 40,7 plt 120  TSH 6.5  Sars covid 19 negative  Chest radiograph with hyperinflation with  bilateral hilar vascular congestion, no infiltrates.   EKG 104 bpm, right axis deviation, Qtc 515, sinus rhythm, with no significant ST segment or T wave changes.   Patient was placed on IV heparin for NSTEMI.  Placed on furosemide for diuresis. Echocardiogram with reduced LV systolic function.    04/06 Cardiac catheterization no significant coronary artery disease. Mean PA pressure 24 mmHg, with wedge pressure 19.   Patient's volume has improved, placed on guideline directed medical therapy and plan for follow up as outpatient.   Assessment and Plan: * NSTEMI (non-ST elevated myocardial infarction) [facility]) Patient was admitted to the progressive care units. She was placed on IV heparin for anticoagulation.   High sensitive troponin peak at 1,294.   Echocardiogram with mild reduction in EF to 40 to 45%, apical hypokinesis, with remaining walls hypokinetic.   Cardiac catheterization with non obstructive coronary artery disease. She has been placed on medical therapy including B blockade, ARB, aspirin and high dose high potency statin. Follow up as outpatient.  Smoking cessation counseling.    Acute systolic CHF (congestive heart failure) (HCC) Patient was placed on IV furosemide for diuresis, negative fluid balance was achieved -4,404 since admission with significant improvement of her symptoms.   Echocardiogram with mild reduction in LV systolic function to 40 to 45%, apical hypokinesis, with remaining walls hypokinetic, RV with preserved systolic function, trivial pericardial effusion, no significant valvular disease.   Cardiac catheterization with mean pulmonary pressure 24, cardiac index 2,7 with capillary wedge pressure 19.   Plan to continue medical therapy with carvedilol, losartan, empagliflozin. Diuresis with furosemide.   AKI (acute kidney injury) (HCC) Hyponatremia and hypokalemia.  Peak creatine was 1,24 at the time of discharge her serum cr is 1,0 with K at 3,8 and  serum bicarbonate at 30. Na 135.  Plan to continue diuresis with furosemide along with Kcl supplementation. Follow up renal function as outpatient.   COPD with chronic bronchitis (HCC) No signs of COPD exacerbation.  Steroids have been discontinued.  Patient will be discharged on as needed bronchodilator therapy. Check ambulatory oxymetry on room air.  Smoking cessation. Will need outpatient follow up with pulmonary function testing.     Positive pulmonary hypertension with mean PA pressure 24 mmHg.   Partial symptomatic epilepsy with complex partial seizures, not intractable, without status epilepticus (HCC) Continue with lamotrigine, no active seizures.   Tobacco dependence Continue nicotine patch, smoking cessation.   Protein-calorie malnutrition, severe Continue nutritional supplementation          Consultants: cardiology  Procedures performed: cardiac cath  Disposition: Home Diet recommendation:  Cardiac diet DISCHARGE MEDICATION: Allergies as of 11/06/2021       Reactions   Azithromycin Nausea And Vomiting   Erythromycin    Sensitivity (GI)   Percocet [oxycodone-acetaminophen]    Penicillins Itching, Rash        Medication List     STOP taking these medications    naproxen sodium 220 MG tablet Commonly known as: ALEVE   predniSONE 20 MG tablet Commonly known as: DELTASONE       TAKE these medications    albuterol (2.5 MG/3ML) 0.083% nebulizer solution Commonly known as: PROVENTIL Take 3 mLs (2.5 mg total) by nebulization every 6 (six) hours as needed for wheezing or shortness of breath.   aspirin 81 MG EC tablet Take 1 tablet (81 mg total) by mouth daily. Swallow whole.   atorvastatin 80 MG tablet Commonly known as: LIPITOR Take 1 tablet (80 mg total) by mouth daily at 6 PM.   carvedilol 6.25 MG tablet Commonly known as: COREG Take 1 tablet (6.25 mg total) by mouth 2 (two) times daily with a meal.   estrogens (conjugated) 1.25 MG  tablet Commonly known as: PREMARIN Take 1.25 mg by mouth daily.   feeding supplement Liqd Take 237 mLs by mouth 2 (two) times daily between meals.   furosemide 40 MG tablet Commonly known as: LASIX Take 1 tablet (40 mg total) by mouth daily.   hydrOXYzine 25 MG tablet Commonly known as: ATARAX Take 0.5 tablets (12.5 mg total) by mouth every 8 (eight) hours as needed for itching.   ipratropium-albuterol 0.5-2.5 (3) MG/3ML Soln Commonly known as: DUONEB Take 3 mLs by nebulization every 6 (six) hours as needed.   Jardiance 10 MG Tabs tablet Generic drug: empagliflozin Take 1 tablet (10 mg total) by mouth daily.   lamoTRIgine 100 MG tablet Commonly known as: LaMICtal Take 1 tablet (100 mg total) by mouth 2 (two) times daily.   losartan 50 MG tablet Commonly known as: COZAAR Take 1 tablet (50 mg total) by mouth daily.   multivitamin with minerals Tabs tablet Take 1 tablet by mouth daily.   potassium chloride SA 20 MEQ tablet Commonly known as: KLOR-CON M Take 1 tablet (20 mEq total) by mouth daily.   progesterone 200 MG capsule Commonly known as: PROMETRIUM Take 200 mg by mouth at bedtime.               Durable Medical Equipment  (From admission, onward)           Start     Ordered   11/06/21 0000  For home use only DME Nebulizer machine       Question Answer Comment  Patient needs a nebulizer to treat with the following condition COPD (chronic obstructive pulmonary disease) (HCC)   Length of Need 12 Months      11/06/21 1007            Follow-up Information     [facility]. Go in 6 day(s).   Specialty: Cardiology Why: Hospital Follow-up Please bring all medications with to appointment [facility] Entrance C, off of [address]  FREE Valet parking. Contact information: [address] [id number] Wilhemina Bonito [place] [address] [phone]               Discharge  Exam: Filed Weights   11/04/21 0400 11/05/21 0319 11/06/21 0500  Weight: 38.8 kg 38.3 kg 37 kg   BP 121/71 (BP Location: Left Arm)   Pulse 76   Temp 97.6 F (36.4 C) (Axillary)   Resp 18   Ht 5\' 2"  (1.575 m)   Wt 37 kg   SpO2 (!) 88%   BMI 14.92 kg/m   Patient is feeling better, dyspnea and edema have improved, no chest pain.  Neurology awake and alert ENT with no pallor Cardiovascular with S1 and S2 present and rhythmic, with no gallops or murmurs.  No JVD No lower extremity edema Respiratory with scattered dry rales, with no wheezing or rhonchi Abdomen not distended   Condition at discharge: stable  The results of significant diagnostics from this hospitalization (including imaging, microbiology, ancillary and laboratory) are listed below for reference.   Imaging Studies: CT ABDOMEN PELVIS WO CONTRAST  Result Date: 11/03/2021 CLINICAL DATA:  Abdominal pain, acute, nonlocalized EXAM: CT ABDOMEN AND PELVIS WITHOUT CONTRAST TECHNIQUE: Multidetector CT imaging of the abdomen and pelvis was performed following the standard protocol without IV contrast. RADIATION DOSE REDUCTION: This exam was performed according to the departmental dose-optimization program which includes automated exposure control, adjustment of the mA and/or kV according to patient size and/or use of iterative reconstruction technique. COMPARISON:  10/29/2010 FINDINGS: Lower chest: Small bilateral pleural effusions. Bibasilar atelectasis. Coronary artery and aortic calcifications. Hepatobiliary: No focal hepatic abnormality. Gallbladder unremarkable. Pancreas: No focal abnormality or ductal dilatation. Spleen: No focal abnormality.  Normal size. Adrenals/Urinary Tract: No adrenal abnormality. No focal renal abnormality. No stones or hydronephrosis. Urinary bladder is unremarkable. Stomach/Bowel: Stomach, large and small bowel grossly unremarkable. Vascular/Lymphatic: Diffuse aortoiliac atherosclerosis. No evidence of  aneurysm or adenopathy. Reproductive: Uterus and adnexa unremarkable.  No mass. Other: No free fluid or free air. Musculoskeletal: No acute bony abnormality. IMPRESSION: Small bilateral pleural effusions.  Bibasilar atelectasis. Coronary artery disease.  Aortic atherosclerosis. No definite acute process in the abdomen or pelvis. Electronically Signed   By: Charlett Nose M.D.   On: 11/03/2021 01:07   CT Angio Chest PE W and/or Wo Contrast  Result Date: 11/02/2021 CLINICAL DATA:  High probability of pulmonary embolism. EXAM: CT ANGIOGRAPHY CHEST WITH CONTRAST TECHNIQUE: Multidetector CT imaging of the chest was performed using the standard protocol during bolus administration of intravenous contrast. Multiplanar CT image reconstructions and MIPs were obtained to evaluate the vascular anatomy. RADIATION DOSE REDUCTION: This exam was performed according to the departmental dose-optimization program which includes automated exposure control, adjustment of the mA and/or kV according to patient size and/or use of iterative reconstruction technique. CONTRAST:  80mL OMNIPAQUE IOHEXOL 350 MG/ML SOLN COMPARISON:  None. FINDINGS: Cardiovascular: Satisfactory opacification of the  pulmonary arteries to the segmental level. No evidence of pulmonary embolism. Normal heart size. No pericardial effusion. Atherosclerosis of thoracic aorta is noted without aneurysm formation. Mediastinum/Nodes: No enlarged mediastinal, hilar, or axillary lymph nodes. Thyroid gland, trachea, and esophagus demonstrate no significant findings. Lungs/Pleura: No pneumothorax is noted. Minimal bilateral pleural effusions are noted with adjacent subsegmental atelectasis. Emphysematous disease is noted bilaterally. Upper Abdomen: No acute abnormality. Musculoskeletal: No chest wall abnormality. No acute or significant osseous findings. Review of the MIP images confirms the above findings. IMPRESSION: No definite evidence of pulmonary embolus. Minimal  bilateral pleural effusions are noted with adjacent subsegmental atelectasis. Aortic Atherosclerosis (ICD10-I70.0) and Emphysema (ICD10-J43.9). Electronically Signed   By: Lupita Raider M.D.   On: 11/02/2021 09:40   CARDIAC CATHETERIZATION  Result Date: 11/05/2021 CONCLUSIONS Widely patent left main Widely patent LAD wrapping around the left ventricular apex.  Large first diagonal contains calcified eccentric 70 to 80% stenosis. [place] 111 mid circumflex bifurcation stenosis with with the second OM containing 30, 75, 40% stenoses. Diffuse irregularities up to 30% proximal to distal RCA.  PDA 30 to 40%. LVEDP 26 mmHg and in conjunction with mildly reduced LVEF of 45% by echo, acute on chronic combined systolic and diastolic heart failure is felt to be present. Mild pulmonary hypertension with mean PA pressure 24 mmHg and felt to be WHO group 2. Normal cardiac index 2.76 L/min/m; PA O2 saturation 66%; AO saturation 96%; capillary wedge pressure mean 19 mmHg. RECOMMENDATION Guideline directed medical therapy for acute on chronic combined systolic and diastolic heart failure.  Considerations would be SGLT2, diuresis as needed, plus minus Entresto. Aggressive risk factor modification for coronary artery disease.   DG CHEST PORT 1 VIEW  Result Date: 11/02/2021 CLINICAL DATA:  Shortness of breath EXAM: PORTABLE CHEST 1 VIEW COMPARISON:  11/02/2021 FINDINGS: There is hyperinflation of the lungs compatible with COPD. Heart and mediastinal contours are within normal limits. No focal opacities or effusions. No acute bony abnormality. IMPRESSION: COPD.  No active cardiopulmonary disease. Electronically Signed   By: Charlett Nose M.D.   On: 11/02/2021 23:58   DG Chest Portable 1 View  Result Date: 11/02/2021 CLINICAL DATA:  66 year old female with history of shortness of breath. Respiratory distress. EXAM: PORTABLE CHEST 1 VIEW COMPARISON:  No priors. FINDINGS: Lung volumes are increased with emphysematous changes. No  acute consolidative airspace disease. No pleural effusions. No pneumothorax. No evidence of pulmonary edema. No definite suspicious appearing pulmonary nodules or masses are noted. Heart size is normal. Upper mediastinal contours are within normal limits. Atherosclerotic calcifications are noted in the thoracic aorta. IMPRESSION: 1. No radiographic evidence of acute cardiopulmonary disease. 2. Emphysema. 3. Aortic atherosclerosis. Electronically Signed   By: Trudie Reed M.D.   On: 11/02/2021 05:01   ECHOCARDIOGRAM COMPLETE  Result Date: 11/02/2021    ECHOCARDIOGRAM REPORT   Patient Name:   SHAYLA NOGUERA Date of Exam: 11/02/2021 Medical Rec #:  [id number]          Height:       62.0 in Accession #:    [id number]         Weight:       94.0 lb Date of Birth:  Jul 07, 1956          BSA:          1.387 m Patient Age:    65 years           BP:  148/64 mmHg Patient Gender: F                  HR:           75 bpm. Exam Location:  Inpatient Procedure: 2D Echo, 3D Echo, Color Doppler and Cardiac Doppler Indications:    Shortness of breath (per patient)                 CP per order  History:        Patient has no prior history of Echocardiogram examinations.  Sonographer:    Neomia Dear RDCS Referring Phys: [id number] Cyndi Bender  Sonographer Comments: Technically difficult study due to poor echo windows. IMPRESSIONS  1. Apical hypokinesis with remaining walls hypokinetic; overall mild to moderate LV dysfunction; calcified aortic valve but no AS by doppler; moderate MAC with mild mitral stenosis.  2. Left ventricular ejection fraction, by estimation, is 40 to 45%. The left ventricle has mild to moderately decreased function. The left ventricle demonstrates regional wall motion abnormalities (see scoring diagram/findings for description). There is  mild left ventricular hypertrophy. Left ventricular diastolic parameters are consistent with Grade I diastolic dysfunction (impaired relaxation). Elevated left atrial  pressure.  3. Right ventricular systolic function is normal. The right ventricular size is normal.  4. The mitral valve is normal in structure. Trivial mitral valve regurgitation. Mild mitral stenosis. Moderate mitral annular calcification.  5. The aortic valve is calcified. Aortic valve regurgitation is trivial. No aortic stenosis is present.  6. The inferior vena cava is normal in size with greater than 50% respiratory variability, suggesting right atrial pressure of 3 mmHg. Comparison(s): No prior Echocardiogram. FINDINGS  Left Ventricle: Left ventricular ejection fraction, by estimation, is 40 to 45%. The left ventricle has mild to moderately decreased function. The left ventricle demonstrates regional wall motion abnormalities. The left ventricular internal cavity size was normal in size. There is mild left ventricular hypertrophy. Left ventricular diastolic parameters are consistent with Grade I diastolic dysfunction (impaired relaxation). Elevated left atrial pressure. Right Ventricle: The right ventricular size is normal. Right ventricular systolic function is normal. Left Atrium: Left atrial size was normal in size. Right Atrium: Right atrial size was normal in size. Pericardium: Trivial pericardial effusion is present. Mitral Valve: The mitral valve is normal in structure. Moderate mitral annular calcification. Trivial mitral valve regurgitation. Mild mitral valve stenosis. MV peak gradient, 12.0 mmHg. The mean mitral valve gradient is 4.5 mmHg. Tricuspid Valve: The tricuspid valve is normal in structure. Tricuspid valve regurgitation is trivial. No evidence of tricuspid stenosis. Aortic Valve: The aortic valve is calcified. Aortic valve regurgitation is trivial. No aortic stenosis is present. Aortic valve mean gradient measures 3.0 mmHg. Aortic valve peak gradient measures 5.9 mmHg. Aortic valve area, by VTI measures 2.00 cm. Pulmonic Valve: The pulmonic valve was not well visualized. Pulmonic valve  regurgitation is trivial. No evidence of pulmonic stenosis. Aorta: The aortic root is normal in size and structure. Venous: The inferior vena cava is normal in size with greater than 50% respiratory variability, suggesting right atrial pressure of 3 mmHg. IAS/Shunts: No atrial level shunt detected by color flow Doppler. Additional Comments: Apical hypokinesis with remaining walls hypokinetic; overall mild to moderate LV dysfunction; calcified aortic valve but no AS by doppler; moderate MAC with mild mitral stenosis.  LEFT VENTRICLE PLAX 2D LVIDd:         4.20 cm     Diastology LVIDs:         3.30 cm  LV e' medial:    4.12 cm/s LV PW:         1.40 cm     LV E/e' medial:  26.2 LV IVS:        1.00 cm     LV e' lateral:   3.87 cm/s LVOT diam:     2.10 cm     LV E/e' lateral: 27.9 LV SV:         42 LV SV Index:   30 LVOT Area:     3.46 cm  LV Volumes (MOD) LV vol d, MOD A2C: 68.5 ml LV vol d, MOD A4C: 76.0 ml LV vol s, MOD A2C: 35.8 ml LV vol s, MOD A4C: 46.6 ml LV SV MOD A2C:     32.7 ml LV SV MOD A4C:     76.0 ml LV SV MOD BP:      34.2 ml RIGHT VENTRICLE RV Basal diam:  2.50 cm RV Mid diam:    1.40 cm LEFT ATRIUM             Index LA Vol (A2C):   69.3 ml 49.97 ml/m LA Vol (A4C):   41.1 ml 29.63 ml/m LA Biplane Vol: 56.7 ml 40.88 ml/m  AORTIC VALVE                    PULMONIC VALVE AV Area (Vmax):    1.90 cm     PV Vmax:          1.01 m/s AV Area (Vmean):   1.81 cm     PV Vmean:         70.000 cm/s AV Area (VTI):     2.00 cm     PV VTI:           0.191 m AV Vmax:           121.00 cm/s  PV Peak grad:     4.1 mmHg AV Vmean:          81.400 cm/s  PV Mean grad:     2.0 mmHg AV VTI:            0.211 m      PR End Diast Vel: 8.29 msec AV Peak Grad:      5.9 mmHg AV Mean Grad:      3.0 mmHg LVOT Vmax:         66.30 cm/s LVOT Vmean:        42.600 cm/s LVOT VTI:          0.122 m LVOT/AV VTI ratio: 0.58  AORTA Ao Root diam: 2.40 cm Ao Asc diam:  3.20 cm MITRAL VALVE                TRICUSPID VALVE MV Area (PHT): 4.21  cm     TR Peak grad:   22.1 mmHg MV Area VTI:   1.14 cm     TR Vmax:        235.00 cm/s MV Peak grad:  12.0 mmHg MV Mean grad:  4.5 mmHg     SHUNTS MV Vmax:       1.73 m/s     Systemic VTI:  0.12 m MV Vmean:      97.4 cm/s    Systemic Diam: 2.10 cm MV Decel Time: 180 msec MV E velocity: 108.00 cm/s MV A velocity: 138.00 cm/s MV E/A ratio:  0.78 Olga Millers MD Electronically signed by Olga Millers MD Signature Date/Time: 11/02/2021/2:11:03 PM  Final    US Abdomen Limited RUQ (LIVER/GB)  Result Date: 11/03/2021 CLINICAL DATA:  Elevated liver function tests EXAM: ULTRASOUND ABDOMEN LIMITED RIGHT UPPER QUADRANT COMPARISON:  Same day CT FINDINGS: Gallbladder: Minimal layering sludge within the gallbladder. No gallstones or wall thickening visualized. No sonographic Murphy sign noted by sonographer. Common bile duct: Diameter: 2 mm. Liver: No focal lesion identified. Within normal limits in parenchymal echogenicity. Portal vein is patent on color Doppler imaging with normal direction of blood flow towards the liver. Other: None. IMPRESSION: 1. Unremarkable sonographic appearance of the liver. 2. Minimal layering sludge within the gallbladder. No gallstone or evidence of cholecystitis. Electronically Signed   By: Duanne Guess D.O.   On: 11/03/2021 10:40    Microbiology: Results for orders placed or performed during the hospital encounter of 11/02/21  Resp Panel by RT-PCR (Flu A&B, Covid) Nasopharyngeal Swab     Status: None   Collection Time: 11/02/21  4:32 AM   Specimen: Nasopharyngeal Swab; Nasopharyngeal(NP) swabs in vial transport medium  Result Value Ref Range Status   SARS Coronavirus 2 by RT PCR NEGATIVE NEGATIVE Final    Comment: (NOTE) SARS-CoV-2 target nucleic acids are NOT DETECTED.  The SARS-CoV-2 RNA is generally detectable in upper respiratory specimens during the acute phase of infection. The lowest concentration of SARS-CoV-2 viral copies this assay can detect is 138 copies/mL.  A negative result does not preclude SARS-Cov-2 infection and should not be used as the sole basis for treatment or other patient management decisions. A negative result may occur with  improper specimen collection/handling, submission of specimen other than nasopharyngeal swab, presence of viral mutation(s) within the areas targeted by this assay, and inadequate number of viral copies(<138 copies/mL). A negative result must be combined with clinical observations, patient history, and epidemiological information. The expected result is Negative.  Fact Sheet for Patients:  BloggerCourse.com  Fact Sheet for Healthcare Providers:  SeriousBroker.it  This test is no t yet approved or cleared by the [place] FDA and  has been authorized for detection and/or diagnosis of SARS-CoV-2 by FDA under an Emergency Use Authorization (EUA). This EUA will remain  in effect (meaning this test can be used) for the duration of the COVID-19 declaration under Section 564(b)(1) of the Act, 21 U.S.C.section 360bbb-3(b)(1), unless the authorization is terminated  or revoked sooner.       Influenza A by PCR NEGATIVE NEGATIVE Final   Influenza B by PCR NEGATIVE NEGATIVE Final    Comment: (NOTE) The Xpert Xpress SARS-CoV-2/FLU/RSV plus assay is intended as an aid in the diagnosis of influenza from Nasopharyngeal swab specimens and should not be used as a sole basis for treatment. Nasal washings and aspirates are unacceptable for Xpert Xpress SARS-CoV-2/FLU/RSV testing.  Fact Sheet for Patients: BloggerCourse.com  Fact Sheet for Healthcare Providers: SeriousBroker.it  This test is not yet approved or cleared by the [place] FDA and has been authorized for detection and/or diagnosis of SARS-CoV-2 by FDA under an Emergency Use Authorization (EUA). This EUA will remain in effect (meaning this test  can be used) for the duration of the COVID-19 declaration under Section 564(b)(1) of the Act, 21 U.S.C. section 360bbb-3(b)(1), unless the authorization is terminated or revoked.  Performed at [facility], [address], [place], [place] [address]   MRSA Next Gen by PCR, Nasal     Status: None   Collection Time: 11/03/21  5:13 PM   Specimen: Nasal Mucosa; Nasal Swab  Result Value Ref Range  Status   MRSA by PCR Next Gen NOT DETECTED NOT DETECTED Final    Comment: (NOTE) The GeneXpert MRSA Assay (FDA approved for NASAL specimens only), is one component of a comprehensive MRSA colonization surveillance program. It is not intended to diagnose MRSA infection nor to guide or monitor treatment for MRSA infections. Test performance is not FDA approved in patients less than 26 years old. Performed at [facility], [address] [place], [place] [address]     Labs: CBC: Recent Labs  Lab 11/02/21 [phone] 11/02/21 4098 11/03/21 1191 11/04/21 0050 11/05/21 0114 11/05/21 0823 11/05/21 0824 11/05/21 0831  WBC 11.5*  --  8.9 7.4 9.3  --   --   --   NEUTROABS 10.0*  --   --   --   --   --   --   --   HGB 12.4   < > 11.2* 11.8* 11.8* 11.9* 11.9* 11.2*  HCT 37.7   < > 33.9* 35.0* 34.9* 35.0* 35.0* 33.0*  MCV 92.9  --  91.4 89.7 90.2  --   --   --   PLT 114*  --  107* 119* 126*  --   --   --    < > = values in this interval not displayed.   Basic Metabolic Panel: Recent Labs  Lab 11/02/21 0431 11/02/21 0504 11/03/21 0823 11/04/21 0050 11/05/21 0114 11/05/21 0823 11/05/21 0824 11/05/21 0831 11/06/21 0048  NA 137   < > 138 133* 134* 137 137 135 135  K 4.0   < > 3.7 4.1 4.0 3.5 3.5 3.5 3.8  CL 105  --  98 95* 97*  --   --   --  98  CO2 21*  --  32 30 29  --   --   --  30  GLUCOSE 167*  --  86 113* 99  --   --   --  77  BUN 24*  --  29* 28* 30*  --   --   --  30*  CREATININE 1.02*  --  1.11* 1.01* 1.24*  --   --   --  1.07*  CALCIUM 8.3*  --  7.8* 8.1* 8.4*  --    --   --  8.4*  MG  --   --   --   --  2.0  --   --   --   --    < > = values in this interval not displayed.   Liver Function Tests: Recent Labs  Lab 11/03/21 0823 11/04/21 0050  AST 116* 56*  ALT 145* 111*  ALKPHOS 71 67  BILITOT 0.5 0.2*  PROT 5.3* 5.1*  ALBUMIN 2.9* 2.7*   CBG: No results for input(s): GLUCAP in the last 168 hours.  Discharge time spent: greater than 30 minutes.  Signed: Coralie Keens, MD [facility] 11/06/2021

## 2021-11-06 NOTE — Progress Notes (Signed)
? ?Progress Note ? ?Patient Name: Christie Moreno ?Date of Encounter: 11/06/2021 ? ?[facility] Cardiologist: Dr. Quay Burow ? ?Subjective  ? ?Feels clinically improved this morning.  Breathing better.  Diuresed 4400 cc. ? ?Inpatient Medications  ?  ?Scheduled Meds: ? aspirin EC  81 mg Oral Daily  ? atorvastatin  80 mg Oral q1800  ? budesonide (PULMICORT) nebulizer solution  0.25 mg Nebulization BID  ? carvedilol  6.25 mg Oral BID WC  ? empagliflozin  10 mg Oral Daily  ? enoxaparin (LOVENOX) injection  30 mg Subcutaneous Q24H  ? furosemide  40 mg Oral Daily  ? guaiFENesin  600 mg Oral BID  ? ipratropium-albuterol  3 mL Nebulization BID  ? lamoTRIgine  100 mg Oral BID  ? losartan  50 mg Oral Daily  ? nicotine  14 mg Transdermal Daily  ? potassium chloride  20 mEq Oral Daily  ? sodium chloride flush  3 mL Intravenous Q12H  ? sodium chloride flush  3 mL Intravenous Q12H  ? sodium chloride flush  3 mL Intravenous Q12H  ? ?Continuous Infusions: ? sodium chloride    ? sodium chloride    ? ?PRN Meds: ?sodium chloride, sodium chloride, acetaminophen, acetaminophen, albuterol, hydrALAZINE, nitroGLYCERIN, ondansetron (ZOFRAN) IV, sodium chloride flush, sodium chloride flush  ? ?Vital Signs  ?  ?Vitals:  ? 11/06/21 0321 11/06/21 0500 11/06/21 0725 11/06/21 0756  ?BP: 137/69   121/71  ?Pulse: 60   76  ?Resp: 20   18  ?Temp: 98 ?F (36.7 ?C)   97.6 ?F (36.4 ?C)  ?TempSrc: Oral   Axillary  ?SpO2: 93%  94% (!) 88%  ?Weight:  37 kg    ?Height:      ? ? ?Intake/Output Summary (Last 24 hours) at 11/06/2021 0946 ?Last data filed at 11/06/2021 6269 ?Gross per 24 hour  ?Intake 438.83 ml  ?Output 2800 ml  ?Net -2361.17 ml  ? ? ?  11/06/2021  ?  5:00 AM 11/05/2021  ?  3:19 AM 11/04/2021  ?  4:00 AM  ?Last 3 Weights  ?Weight (lbs) 81 lb 9.1 oz 84 lb 8 oz 85 lb 8 oz  ?Weight (kg) 37 kg 38.329 kg 38.783 kg  ?   ? ?Telemetry  ?  ?Sinus rhythm- Personally Reviewed ? ?ECG  ?  ?Not performed today- Personally Reviewed ? ?Physical Exam  ? ?GEN: No  acute distress.   ?Neck: No JVD ?Cardiac: RRR, no murmurs, rubs, or gallops.  ?Respiratory: Clear to auscultation bilaterally. ?GI: Soft, nontender, non-distended  ?MS: No edema; No deformity. ?Neuro:  Nonfocal  ?Psych: Normal affect  ? ?Labs  ?  ?High Sensitivity Troponin:   ?Recent Labs  ?Lab 11/02/21 ?0431 11/02/21 ?0841  ?TROPONINIHS 791* 1,294*  ?   ?Chemistry ?Recent Labs  ?Lab 11/03/21 ?0823 11/04/21 ?4854 11/05/21 ?0114 11/05/21 ?6270 11/05/21 ?3500 11/05/21 ?9381 11/06/21 ?8299  ?NA 138 133* 134*   < > 137 135 135  ?K 3.7 4.1 4.0   < > 3.5 3.5 3.8  ?CL 98 95* 97*  --   --   --  98  ?CO2 32 30 29  --   --   --  30  ?GLUCOSE 86 113* 99  --   --   --  77  ?BUN 29* 28* 30*  --   --   --  30*  ?CREATININE 1.11* 1.01* 1.24*  --   --   --  1.07*  ?CALCIUM 7.8* 8.1* 8.4*  --   --   --  8.4*  ?MG  --   --  2.0  --   --   --   --   ?PROT 5.3* 5.1*  --   --   --   --   --   ?ALBUMIN 2.9* 2.7*  --   --   --   --   --   ?AST 116* 56*  --   --   --   --   --   ?ALT 145* 111*  --   --   --   --   --   ?ALKPHOS 71 67  --   --   --   --   --   ?BILITOT 0.5 0.2*  --   --   --   --   --   ?GFRNONAA 54* >60 48*  --   --   --  57*  ?ANIONGAP _0 --   --   --  7  ? < > = values in this interval not displayed.  ?  ?Lipids  ?Recent Labs  ?Lab 11/02/21 ?1122  ?CHOL 168  ?TRIG 15  ?HDL 99  ?[place] 66  ?CHOLHDL 1.7  ?  ?Hematology ?Recent Labs  ?Lab 11/03/21 ?0823 11/04/21 ?5573 11/05/21 ?0114 11/05/21 ?2202 11/05/21 ?5427 11/05/21 ?0623  ?WBC 8.9 7.4 9.3  --   --   --   ?RBC 3.71* 3.90 3.87  --   --   --   ?HGB 11.2* 11.8* 11.8* 11.9* 11.9* 11.2*  ?HCT 33.9* 35.0* 34.9* 35.0* 35.0* 33.0*  ?MCV 91.4 89.7 90.2  --   --   --   ?MCH 30.2 30.3 30.5  --   --   --   ?MCHC 33.0 33.7 33.8  --   --   --   ?RDW 13.6 13.4 13.3  --   --   --   ?PLT 107* 119* 126*  --   --   --   ? ?Thyroid  ?Recent Labs  ?Lab 11/02/21 ?1122  ?TSH 2.025  ?  ?BNP ?Recent Labs  ?Lab 11/02/21 ?7628  ?BNP 2,444.1*  ?  ?DDimer No results for input(s): DDIMER in  the last 168 hours.  ? ?Radiology  ?  ?CARDIAC CATHETERIZATION ? ?Result Date: 11/05/2021 ?CONCLUSIONS Widely patent left main Widely patent LAD wrapping around the left ventricular apex.  Large first diagonal contains calcified eccentric 70 to 80% stenosis. [place] 111 mid circumflex bifurcation stenosis with with the second OM containing 30, 75, 40% stenoses. Diffuse irregularities up to 30% proximal to distal RCA.  PDA 30 to 40%. LVEDP 26 mmHg and in conjunction with mildly reduced LVEF of 45% by echo, acute on chronic combined systolic and diastolic heart failure is felt to be present. Mild pulmonary hypertension with mean PA pressure 24 mmHg and felt to be WHO group 2. Normal cardiac index 2.76 L/min/m?; PA O2 saturation 66%; AO saturation 96%; capillary wedge pressure mean 19 mmHg. RECOMMENDATION Guideline directed medical therapy for acute on chronic combined systolic and diastolic heart failure.  Considerations would be SGLT2, diuresis as needed, plus minus Entresto. Aggressive risk factor modification for coronary artery disease.   ? ?Cardiac Studies  ? ? ? ?IMPRESSIONS  ? ? ? 1. Apical hypokinesis with remaining walls hypokinetic; overall mild to  ?moderate LV dysfunction; calcified aortic valve but no AS by doppler;  ?moderate MAC with mild mitral stenosis.  ? 2. Left ventricular ejection fraction, by estimation, is 40 to 45%. The  ?left  ventricle has mild to moderately decreased function. The left  ?ventricle demonstrates regional wall motion abnormalities (see scoring  ?diagram/findings for description). There is  ? mild left ventricular hypertrophy. Left ventricular diastolic parameters  ?are consistent with Grade I diastolic dysfunction (impaired relaxation).  ?Elevated left atrial pressure.  ? 3. Right ventricular systolic function is normal. The right ventricular  ?size is normal.  ? 4. The mitral valve is normal in structure. Trivial mitral valve  ?regurgitation. Mild mitral stenosis. Moderate mitral  annular  ?calcification.  ? 5. The aortic valve is calcified. Aortic valve regurgitation is trivial.  ?No aortic stenosis is present.  ? 6. The inferior vena cava is normal in size with greater than 50%  ?respiratory variability, suggesting right atrial pressure of 3 mmHg. ? ?Patient Profile  ?   ?66 y.o. female with a hx of seizure, chronic headache,  chronic hormone therapy, who was seen 11/02/2021 for the evaluation of NSTEMI at the request of [name], PA ? ?Assessment & Plan  ?  ?1: Non-STEMI-troponins peaked at 1294.  On IV heparin, aspirin and Coreg.  2D echo revealed an EF of 40 to 45% without significant valvular abnormalities.  She is pain-free.  LHC showed non critical CAD. ? ?2: Systolic/diastolic heart failure-patient was admitted with dyspnea.  She does have a history of COPD.  Her BNP was elevated at 2444.  She did have peripheral edema.  She has diuresed nicely 1100 cc with IV Lasix and feels clinically improved.  She is on low-dose carvedilol.  We will plan on adding an ACE/ARB after cardiac catheterization.Filling pressures were mildly elevated. Will start low dose Losartan. Plan repeat 2D in 3 months. Agree with starting a SGLT2 med.  ? ?3: Essential hypertension-on carvedilol.  Blood pressure moderately elevated.  We will continue to add/titrate medications ?   ?She is well diuresed. Would prob transition to PO lasix 40 mg/day.  ?I spoke to Dr. Cathlean Sauer.  Stable for discharge today.  We will arrange TOC 7 then follow-up with me in 4 to 6 weeks. ? ?For questions or updates, please contact [place] ?Please consult [email or web address] for contact info under  ? ?  ?   ?Signed, ?Quay Burow, MD  ?11/06/2021, 9:46 AM   ? ?

## 2021-11-06 NOTE — Evaluation (Addendum)
Occupational Therapy Evaluation ?Patient Details ?Name: Christie Moreno ?MRN: [id number] ?DOB: 1956-04-21 ?Today's Date: 11/06/2021 ? ? ?History of Present Illness 66 yo admitted 4/3 with SOB, respiratory distress, NSTEMI and acute decompensated HF. 4/6 radial heart cath. PMhx: Sz, COPD, tobacco dependence, anxiety  ? ?Clinical Impression ?  ?PTA, pt was living with her significant other and was independent. Pt currently requiring Min Guard-Min A for LB ADLs and functional mobility due to decreased balance. Pt presenting with slight LOB with dynamic movement and able to correct with light Min A. Pt very motivated and eager to return home. SpO2 maintaining in 90s on RA. Pt would benefit from further acute OT to facilitate safe dc. Recommend dc to home once medically stable per physician.  ?   ? ?Recommendations for follow up therapy are one component of a multi-disciplinary discharge planning process, led by the attending physician.  Recommendations may be updated based on patient status, additional functional criteria and insurance authorization.  ? ?Follow Up Recommendations ? No OT follow up  ?  ?Assistance Recommended at Discharge Intermittent Supervision/Assistance  ?Patient can return home with the following A little help with walking and/or transfers;A little help with bathing/dressing/bathroom;Assist for transportation;Assistance with cooking/housework ? ?  ?Functional Status Assessment ? Patient has had a recent decline in their functional status and demonstrates the ability to make significant improvements in function in a reasonable and predictable amount of time.  ?Equipment Recommendations ? None recommended by OT  ?  ?Recommendations for Other Services PT consult ? ? ?  ?Precautions / Restrictions Precautions ?Precautions: Fall ?Restrictions ?Weight Bearing Restrictions: No  ? ?  ? ?Mobility Bed Mobility ?Overal bed mobility: Needs Assistance ?Bed Mobility: Supine to Sit ?  ?  ?Supine to sit:  Supervision ?  ?  ?  ?  ? ?Transfers ?Overall transfer level: Needs assistance ?Equipment used: None ?Transfers: Sit to/from Stand ?Sit to Stand: Min guard ?  ?  ?  ?  ?  ?  ?  ? ?  ?Balance Overall balance assessment: Needs assistance ?Sitting-balance support: No upper extremity supported, Feet supported ?Sitting balance-Leahy Scale: Good ?  ?  ?Standing balance support: No upper extremity supported, During functional activity ?Standing balance-Leahy Scale: Fair ?Standing balance comment: Able to maintain static standing without UE support ?  ?  ?  ?  ?  ?  ?  ?  ?  ?  ?  ?   ? ?ADL either performed or assessed with clinical judgement  ? ?ADL Overall ADL's : Needs assistance/impaired ?Eating/Feeding: Set up;Sitting ?  ?Grooming: Oral care;Supervision/safety;Standing ?  ?Upper Body Bathing: Set up;Supervision/ safety;Sitting ?  ?Lower Body Bathing: Min guard;Minimal assistance;Sit to/from stand ?  ?Upper Body Dressing : Supervision/safety;Set up;Sitting ?  ?Lower Body Dressing: Min guard;Minimal assistance;Sit to/from stand ?  ?Toilet Transfer: Min guard;Ambulation;Grab bars;Regular Toilet ?  ?Toileting- Clothing Manipulation and Hygiene: Supervision/safety;Sitting/lateral lean ?  ?  ?  ?Functional mobility during ADLs: Min guard;Minimal assistance ?General ADL Comments: Pt requiring Min Guard A-Min A for LB ADLs and functional mobility due to decreased balance.  ? ? ? ?Vision   ?   ?   ?Perception   ?  ?Praxis   ?  ? ?Pertinent Vitals/Pain Pain Assessment ?Pain Assessment: Faces ?Faces Pain Scale: No hurt ?Pain Intervention(s): Monitored during session  ? ? ? ?Hand Dominance Right ?  ?Extremity/Trunk Assessment Upper Extremity Assessment ?Upper Extremity Assessment: Generalized weakness ?  ?Lower Extremity Assessment ?Lower Extremity Assessment: Defer to PT  evaluation ?  ?Cervical / Trunk Assessment ?Cervical / Trunk Assessment: Kyphotic ?  ?Communication Communication ?Communication: No difficulties ?  ?Cognition  Arousal/Alertness: Awake/alert ?Behavior During Therapy: [facility] for tasks assessed/performed ?Overall Cognitive Status: Impaired/Different from baseline ?Area of Impairment: Problem solving ?  ?  ?  ?  ?  ?  ?  ?  ?  ?  ?  ?  ?  ?  ?Problem Solving: Slow processing ?General Comments: Requiring increased time. Feel this is her baseline for cognition. Very agreeable to therapy. ?  ?  ?General Comments  HR 86. SpO2 94% on RA. ? ?  ?Exercises   ?  ?Shoulder Instructions    ? ? ?Home Living Family/patient expects to be discharged to:: Private residence ?Living Arrangements: Spouse/significant other ?Available Help at Discharge: Family;Available 24 hours/day ?Type of Home: House ?Home Access: Stairs to enter ?Entrance Stairs-Number of Steps: 6 ?  ?Home Layout: One level ?  ?  ?Bathroom Shower/Tub: Tub/shower unit ?  ?Bathroom Toilet: Standard ?  ?  ?Home Equipment: None ?  ?  ?  ? ?  ?Prior Functioning/Environment Prior Level of Function : Independent/Modified Independent ?  ?  ?  ?  ?  ?  ?  ?ADLs Comments: ADLs and IADLs ?  ? ?  ?  ?OT Problem List: Decreased strength;Decreased range of motion;Decreased activity tolerance;Impaired balance (sitting and/or standing);Decreased knowledge of use of DME or AE;Decreased knowledge of precautions ?  ?   ?OT Treatment/Interventions: Self-care/ADL training;Therapeutic exercise;Energy conservation;DME and/or AE instruction  ?  ?OT Goals(Current goals can be found in the care plan section) Acute Rehab OT Goals ?Patient Stated Goal: Go home ?OT Goal Formulation: With patient ?Time For Goal Achievement: 11/20/21 ?Potential to Achieve Goals: Good  ?OT Frequency: Min 2X/week ?  ? ?Co-evaluation   ?  ?  ?  ?  ? ?  ?AM-PAC OT "6 Clicks" Daily Activity     ?Outcome Measure Help from another person eating meals?: None ?Help from another person taking care of personal grooming?: A Little ?Help from another person toileting, which includes using toliet, bedpan, or urinal?: A Little ?Help from  another person bathing (including washing, rinsing, drying)?: A Little ?Help from another person to put on and taking off regular upper body clothing?: A Little ?Help from another person to put on and taking off regular lower body clothing?: A Little ?6 Click Score: 19 ?  ?End of Session Nurse Communication: Mobility status ? ?Activity Tolerance: Patient tolerated treatment well ?Patient left: in chair;with call bell/phone within reach ? ?OT Visit Diagnosis: Unsteadiness on feet (R26.81);Other abnormalities of gait and mobility (R26.89);Muscle weakness (generalized) (M62.81)  ?              ?Time: 9935-7017 ?OT Time Calculation (min): 32 min ?Charges:  OT General Charges ?$OT Visit: 1 Visit ?OT Evaluation ?$OT Eval Moderate Complexity: 1 Mod ?OT Treatments ?$Self Care/Home Management : 8-22 mins ? ?Jacquelynn Friend MSOT, OTR/L ?Acute Rehab ?Pager: [phone] ?Office: [phone] ? ?Dawnita Molner M Cheo Selvey ?11/06/2021, 11:01 AM ?

## 2021-11-11 ENCOUNTER — Other Ambulatory Visit (HOSPITAL_COMMUNITY): Payer: Self-pay | Admitting: Adult Health

## 2021-11-11 ENCOUNTER — Encounter (HOSPITAL_COMMUNITY): Payer: Self-pay

## 2021-11-11 ENCOUNTER — Ambulatory Visit (HOSPITAL_COMMUNITY)
Admit: 2021-11-11 | Discharge: 2021-11-11 | Disposition: A | Discharge status: Home / Self Care | Payer: Medicare Other | Attending: Adult Health | Admitting: Adult Health

## 2021-11-11 VITALS — BP 90/62 | HR 57 | Ht 62.0 in | Wt 84.4 lb

## 2021-11-11 DIAGNOSIS — F419 Anxiety disorder, unspecified: Secondary | ICD-10-CM | POA: Insufficient documentation

## 2021-11-11 DIAGNOSIS — Z79899 Other long term (current) drug therapy: Secondary | ICD-10-CM | POA: Diagnosis not present

## 2021-11-11 DIAGNOSIS — Z7982 Long term (current) use of aspirin: Secondary | ICD-10-CM | POA: Diagnosis not present

## 2021-11-11 DIAGNOSIS — F17200 Nicotine dependence, unspecified, uncomplicated: Secondary | ICD-10-CM | POA: Insufficient documentation

## 2021-11-11 DIAGNOSIS — R600 Localized edema: Secondary | ICD-10-CM | POA: Diagnosis not present

## 2021-11-11 DIAGNOSIS — J449 Chronic obstructive pulmonary disease, unspecified: Secondary | ICD-10-CM | POA: Diagnosis not present

## 2021-11-11 DIAGNOSIS — I5022 Chronic systolic (congestive) heart failure: Secondary | ICD-10-CM | POA: Diagnosis present

## 2021-11-11 DIAGNOSIS — I5032 Chronic diastolic (congestive) heart failure: Secondary | ICD-10-CM | POA: Diagnosis not present

## 2021-11-11 DIAGNOSIS — I2510 Atherosclerotic heart disease of native coronary artery without angina pectoris: Secondary | ICD-10-CM | POA: Insufficient documentation

## 2021-11-11 DIAGNOSIS — Z720 Tobacco use: Secondary | ICD-10-CM | POA: Diagnosis not present

## 2021-11-11 DIAGNOSIS — Z7984 Long term (current) use of oral hypoglycemic drugs: Secondary | ICD-10-CM | POA: Insufficient documentation

## 2021-11-11 MED ORDER — LOSARTAN POTASSIUM 50 MG PO TABS: 25.0000 mg | ORAL_TABLET | Freq: Every day | ORAL | 0 refills | Status: DC

## 2021-11-11 MED ORDER — FUROSEMIDE 40 MG PO TABS
20.0000 mg | ORAL_TABLET | Freq: Every day | ORAL | 0 refills | Status: DC
Start: 2021-11-11 — End: 2022-01-14

## 2021-11-11 NOTE — Progress Notes (Signed)
? ? ?HEART & VASCULAR TRANSITION OF CARE CONSULT NOTE  ? ? ? ?Referring Physician:Dr Arrien  ?Primary Care: None -->establishing 11/23/21  ?Primary Cardiologist:Dr Gwenlyn Found ? ?HPI: ?Referred to clinic by Dr Cathlean Sauer for heart failure consultation.  ? ?Christie Moreno is a 66 year old with a history of COPD, seizures, anxiety, CAD, tobacco abuse, Raynaud, and HFmEF.  ? ?Arrived to [facility] on [facility] via EMS due to acute dyspnea. BNP elevated. Echo  EF 40-45%. Diuresed with IV lasix. Cath with nonobstructive CAD. Started on bb, sglt2i, and losartan.  ? ?She presents for HF post hospital follow up with her boyfriend.  Overall feeling tired.  Denies SOB/PND/Orthopnea. She has a hard time walking around the store due to foot pain. Says she has plantar warts.  Appetite poor. No fever or chills. Weight at home 82-84 pounds. Smokes 1 pack every 2-3 days. Taking all medications.  ? ?Cardiac Testing  ?Cath 11/05/21  ?OM containing 30, 75, 40% stenoses. ?Diffuse irregularities up to 30% proximal to distal RCA.  PDA 30 to 40%. ?CI 2.76 PA sat 66%.  PCWP 19 ? ?Echo 11/02/2021- Apical HK LVEF 40-45%  ? ?Review of Systems: [y] = yes, '[ ]'$  = no  ? ?General: Weight gain '[ ]'$ ; Weight loss '[ ]'$ ; Anorexia '[ ]'$ ; Fatigue [ Y]; Fever '[ ]'$ ; Chills '[ ]'$ ; Weakness [ Y]  ?Cardiac: Chest pain/pressure '[ ]'$ ; Resting SOB '[ ]'$ ; Exertional SOB '[ ]'$ ; Orthopnea '[ ]'$ ; Pedal Edema '[ ]'$ ; Palpitations '[ ]'$ ; Syncope '[ ]'$ ; Presyncope '[ ]'$ ; Paroxysmal nocturnal dyspnea'[ ]'$   ?Pulmonary: Cough '[ ]'$ ; Wheezing'[ ]'$ ; Hemoptysis'[ ]'$ ; Sputum '[ ]'$ ; Snoring '[ ]'$   ?GI: Vomiting'[ ]'$ ; Dysphagia'[ ]'$ ; Melena'[ ]'$ ; Hematochezia '[ ]'$ ; Heartburn'[ ]'$ ; Abdominal pain '[ ]'$ ; Constipation '[ ]'$ ; Diarrhea '[ ]'$ ; BRBPR '[ ]'$   ?GU: Hematuria'[ ]'$ ; Dysuria '[ ]'$ ; Nocturia'[ ]'$   ?Vascular: Pain in legs with walking [Y ]; Pain in feet with lying flat '[ ]'$ ; Non-healing sores '[ ]'$ ; Stroke '[ ]'$ ; TIA '[ ]'$ ; Slurred speech '[ ]'$ ;  ?Neuro: Headaches'[ ]'$ ; Vertigo'[ ]'$ ; Seizures[ Y]; Paresthesias'[ ]'$ ;Blurred vision '[ ]'$ ; Diplopia '[ ]'$ ; Vision changes '[ ]'$    ?Ortho/Skin: Arthritis '[ ]'$ ; Joint pain [ Y]; Muscle pain '[ ]'$ ; Joint swelling '[ ]'$ ; Back Pain [ Y]; Rash '[ ]'$   ?Psych: Depression'[ ]'$ ; Anxiety'[ ]'$   ?Heme: Bleeding problems '[ ]'$ ; Clotting disorders '[ ]'$ ; Anemia '[ ]'$   ?Endocrine: Diabetes '[ ]'$ ; Thyroid dysfunction'[ ]'$  ? ? ?Past Medical History:  ?Diagnosis Date  ? Seizures ([place])   ? none in years, recently had medication change  ? ? ?Current Outpatient Medications  ?Medication Sig Dispense Refill  ? albuterol (PROVENTIL) (2.5 MG/3ML) 0.083% nebulizer solution Take 3 mLs (2.5 mg total) by nebulization every 6 (six) hours as needed for wheezing or shortness of breath. 90 mL 12  ? aspirin 81 MG EC tablet Take 1 tablet (81 mg total) by mouth daily. Swallow whole. 30 tablet 0  ? atorvastatin (LIPITOR) 80 MG tablet Take 1 tablet (80 mg total) by mouth daily at 6 PM. 30 tablet 0  ? carvedilol (COREG) 6.25 MG tablet Take 1 tablet (6.25 mg total) by mouth 2 (two) times daily with a meal. 60 tablet 0  ? empagliflozin (JARDIANCE) 10 MG TABS tablet Take 1 tablet (10 mg total) by mouth daily. 30 tablet 0  ? estrogens, conjugated, (PREMARIN) 1.25 MG tablet Take 1.25 mg by mouth daily.    ? feeding supplement (ENSURE ENLIVE / ENSURE PLUS) LIQD  Take 237 mLs by mouth 2 (two) times daily between meals. 237 mL 0  ? furosemide (LASIX) 40 MG tablet Take 1 tablet (40 mg total) by mouth daily. 30 tablet 0  ? hydrOXYzine (ATARAX/VISTARIL) 25 MG tablet Take 0.5 tablets (12.5 mg total) by mouth every 8 (eight) hours as needed for itching. 30 tablet 0  ? ipratropium-albuterol (DUONEB) 0.5-2.5 (3) MG/3ML SOLN Take 3 mLs by nebulization every 6 (six) hours as needed. 360 mL 0  ? lamoTRIgine (LAMICTAL) 100 MG tablet Take 1 tablet (100 mg total) by mouth 2 (two) times daily. 60 tablet 11  ? losartan (COZAAR) 50 MG tablet Take 1 tablet (50 mg total) by mouth daily. 30 tablet 0  ? Multiple Vitamin (MULTIVITAMIN WITH MINERALS) TABS tablet Take 1 tablet by mouth daily. 30 tablet 0  ? potassium chloride SA  (KLOR-CON M) 20 MEQ tablet Take 1 tablet (20 mEq total) by mouth daily. 30 tablet 0  ? progesterone (PROMETRIUM) 200 MG capsule Take 200 mg by mouth at bedtime.    ? ?No current facility-administered medications for this encounter.  ? ? ?Allergies  ?Allergen Reactions  ? Azithromycin Nausea And Vomiting  ? Erythromycin   ?  Sensitivity (GI)  ? Percocet [Oxycodone-Acetaminophen]   ? Penicillins Itching and Rash  ? ? ?  ?Social History  ? ?Socioeconomic History  ? Marital status: Significant Other  ?  Spouse name: Janace Hoard  ? Number of children: 1  ? Years of education: Not on file  ? Highest education level: Bachelor's degree (e.g., BA, AB, BS)  ?Occupational History  ? Occupation: retired  ?Tobacco Use  ? Smoking status: Former  ?  Packs/day: 1.00  ?  Years: 52.00  ?  Pack years: 52.00  ?  Types: Cigarettes  ?  Quit date: 11/09/2021  ? Smokeless tobacco: Never  ? Tobacco comments:  ?  Currently 1 pack every 2-3 days  ?Vaping Use  ? Vaping Use: Never used  ?Substance and Sexual Activity  ? Alcohol use: Yes  ?  Comment: socially  ? Drug use: No  ? Sexual activity: Not on file  ?Other Topics Concern  ? Not on file  ?Social History Narrative  ? Not on file  ? ?Social Determinants of Health  ? ?Financial Resource Strain: Low Risk   ? Difficulty of Paying Living Expenses: Not hard at all  ?Food Insecurity: No Food Insecurity  ? Worried About Charity fundraiser in the Last Year: Never true  ? Ran Out of Food in the Last Year: Never true  ?Transportation Needs: No Transportation Needs  ? Lack of Transportation (Medical): No  ? Lack of Transportation (Non-Medical): No  ?Physical Activity: Not on file  ?Stress: Not on file  ?Social Connections: Not on file  ?Intimate Partner Violence: Not on file  ? ? ?  ?Family History  ?Problem Relation Age of Onset  ? Diabetes Mother   ? Cancer Mother   ?     breast  ? Stroke Mother   ? Heart Problems Father   ? Diabetes Sister   ? ? ?Vitals:  ? 11/11/21 1424  ?BP: 90/62  ?Weight: 38.3 kg  (84 lb 6.4 oz)  ?Height: '5\' 2"'$  (1.575 m)  ? ?Wt Readings from Last 3 Encounters:  ?11/11/21 38.3 kg (84 lb 6.4 oz)  ?11/06/21 37 kg (81 lb 9.1 oz)  ?08/10/21 40.4 kg (89 lb)  ? ?PHYSICAL EXAM: ?General:  Arrived in a wheel chair. Thin.  No respiratory difficulty ?HEENT: normal ?Neck: supple. no JVD. Carotids 2+ bilat; no bruits. No lymphadenopathy or thryomegaly appreciated. ?Cor: PMI nondisplaced. Regular rate & rhythm. No rubs, gallops or murmurs. ?Lungs: clear ?Abdomen: soft, nontender, nondistended. No hepatosplenomegaly. No bruits or masses. Good bowel sounds. ?Extremities: no cyanosis, clubbing, rash, edema. Fingers cool.  ?Neuro: alert & oriented x 3, cranial nerves grossly intact. moves all 4 extremities w/o difficulty. Affect pleasant. ? ?KZL:DJTTS Brady 58 bpm personally checked.  ? ? ?ASSESSMENT & PLAN: ?1. HFmEF/HFpEF  ?Echo EF 40-45%.  ?NYHA III. Diuresed during recent hospitalization.  ?GDMT  ?-Diuretic-Appears euvolemic and likely a little dry. GDMT limited by hypotension.  ?-Cut back lasix to 20 mg daily and can take an additional 20 mg for lower extremity edema as needed  ?-BB-Continue coreg 6.25 mg twice a day  ?-Ace/ARB/ARNI- Cut back losartan to 25 mg at bed time.  ?-MRA- Hold off for now with hypotension.  ?-SGLT2i- Continue jardiance 10 mg daily ?-I have asked her to record daily BP.  ?-Check BMET  ?- Repeat ECHO after HF meds optimized.  ? ?2. CAD ?-LHC 11/05/21 non critical CAD ?-Continue atorvastatin 80 mg daily, aspirin, and coreg.  ?-No chest pain.  ? ?3. Tobacco Abuse ?-Heavy smoker for many years.  ?-Discussed cessation.  ? ? ?Referred to HFSW (PCP, Medications, Transportation, ETOH Abuse, Drug Abuse, Insurance, Financial ): Yes or No ?Refer to Pharmacy:  No ?Refer to Home Health: No ?Refer to Advanced Heart Failure Clinic:  no  ?Refer to General Cardiology:  She is a Dr Gwenlyn Found patient.  ? ?Follow up in 2-3 weeks for one additional visit then will return to General Cardiology  ? ?Christie Brasel  NP-C  ?3:34 PM ? ? ?

## 2021-11-11 NOTE — Patient Instructions (Signed)
DECREASE Losartan to 25 mg (one half tab) nightly at bedtime ?DECREASE Lasix to 20 mg daily ? ?Labs today ?We will only contact you if something comes back abnormal or we need to make some changes. ?Otherwise no news is good news! ? ?Your physician has requested that you regularly monitor and record your blood pressure readings at home. Please use the same machine at the same time of day to check your readings and record them to bring to your follow-up visit. ? ?Thank you for allowing [place] to provider your heart failure care after your recent hospitalization. Please follow-up in 2-3 weeks. ? ? ?Do the following things EVERYDAY: ?Weigh yourself in the morning before breakfast. Write it down and keep it in a log. ?Take your medicines as prescribed ?Eat low salt foods--Limit salt (sodium) to 2000 mg per day.  ?Stay as active as you can everyday ?Limit all fluids for the day to less than 2 liters ? ? ?At the [facility], you and your health needs are our priority. As part of our continuing mission to provide you with exceptional heart care, we have created designated Provider Care Teams. These Care Teams include your primary Cardiologist (physician) and Advanced Practice Providers (APPs- Physician Assistants and Nurse Practitioners) who all work together to provide you with the care you need, when you need it.  ? ?You may see any of the following providers on your designated Care Team at your next follow up: ?Dr Glori Bickers ?Dr Loralie Champagne ?Darrick Grinder, NP ?Lyda Jester, PA ?Jessica Milford,NP ?Marlyce Huge, PA ?Audry Riles, PharmD ? ? ?Please be sure to bring in all your medications bottles to every appointment.  ?  ? ?

## 2021-11-14 ENCOUNTER — Telehealth: Payer: Self-pay | Admitting: Student

## 2021-11-14 DIAGNOSIS — I5021 Acute systolic (congestive) heart failure: Secondary | ICD-10-CM

## 2021-11-14 MED ORDER — EMPAGLIFLOZIN 10 MG PO TABS: 10.0000 mg | ORAL_TABLET | Freq: Every day | ORAL | 5 refills | Status: DC

## 2021-11-14 NOTE — Telephone Encounter (Signed)
? ?  Patient called [facility] requesting refill of Jardiance. She is on Jardiance '10mg'$  daily. Recently seen by Darrick Grinder, NP-C, on 11/11/2021 and this was continued. Will send in refill to requested Pharmacy. ? ?Darreld Mclean, PA-C ?11/14/2021 3:35 PM ? ? ?

## 2021-11-23 ENCOUNTER — Encounter: Payer: Self-pay | Admitting: Physician Assistant

## 2021-11-23 ENCOUNTER — Ambulatory Visit (INDEPENDENT_AMBULATORY_CARE_PROVIDER_SITE_OTHER): Payer: Medicare Other | Admitting: Physician Assistant

## 2021-11-23 VITALS — BP 120/68 | HR 81 | Temp 97.7°F | Ht 62.0 in | Wt 86.8 lb

## 2021-11-23 DIAGNOSIS — I214 Non-ST elevation (NSTEMI) myocardial infarction: Secondary | ICD-10-CM

## 2021-11-23 DIAGNOSIS — G40209 Localization-related (focal) (partial) symptomatic epilepsy and epileptic syndromes with complex partial seizures, not intractable, without status epilepticus: Secondary | ICD-10-CM

## 2021-11-23 DIAGNOSIS — I2510 Atherosclerotic heart disease of native coronary artery without angina pectoris: Secondary | ICD-10-CM | POA: Diagnosis not present

## 2021-11-23 DIAGNOSIS — J449 Chronic obstructive pulmonary disease, unspecified: Secondary | ICD-10-CM

## 2021-11-23 DIAGNOSIS — B070 Plantar wart: Secondary | ICD-10-CM

## 2021-11-23 DIAGNOSIS — I5022 Chronic systolic (congestive) heart failure: Secondary | ICD-10-CM

## 2021-11-23 DIAGNOSIS — Z7689 Persons encountering health services in other specified circumstances: Secondary | ICD-10-CM | POA: Diagnosis not present

## 2021-11-23 DIAGNOSIS — F419 Anxiety disorder, unspecified: Secondary | ICD-10-CM

## 2021-11-23 NOTE — Patient Instructions (Signed)

## 2021-11-23 NOTE — Progress Notes (Signed)
? ?New Patient Office Visit ? ?Subjective   ? ?Patient ID: Christie Moreno, female    DOB: Feb 17, 1956  Age: 66 y.o. MRN: [id number] ? ?CC:  ?Chief Complaint  ?Patient presents with  ? New Patient (Initial Visit)  ? ? ?HPI ?Christie Moreno presents to establish care. Patient is accompanied by her fiance. Patient has a past medical hx of COPD and seizures. Recently hospitalized and diagnosed with congestive heart failure. Reports taking carvediolol 6.25 mg BID, losartan 25 mg daily, Jardiance 10 mg daily, Lasix 20 mg daily, potassium 20 mEq daily and atorvastatin 80 mg. Reports on hormone replacement therapy which is managed by OB/GYN (Dr. Philis Pique). On Lamictal 100 mg daily which is managed by neurology for seizure. Patient states since her hospital discharge has not smoked. Also has c/o plantar warts which have been more bothersome. Has not tried anything.  ? ?  ? ? ?Outpatient Encounter Medications as of 11/23/2021  ?Medication Sig  ? albuterol (PROVENTIL) (2.5 MG/3ML) 0.083% nebulizer solution Take 3 mLs (2.5 mg total) by nebulization every 6 (six) hours as needed for wheezing or shortness of breath.  ? aspirin 81 MG EC tablet Take 1 tablet (81 mg total) by mouth daily. Swallow whole.  ? atorvastatin (LIPITOR) 80 MG tablet Take 1 tablet (80 mg total) by mouth daily at 6 PM.  ? carvedilol (COREG) 6.25 MG tablet Take 1 tablet (6.25 mg total) by mouth 2 (two) times daily with a meal.  ? empagliflozin (JARDIANCE) 10 MG TABS tablet Take 1 tablet (10 mg total) by mouth daily.  ? estrogens, conjugated, (PREMARIN) 1.25 MG tablet Take 1.25 mg by mouth daily.  ? feeding supplement (ENSURE ENLIVE / ENSURE PLUS) LIQD Take 237 mLs by mouth 2 (two) times daily between meals.  ? furosemide (LASIX) 40 MG tablet Take 0.5 tablets (20 mg total) by mouth daily.  ? hydrOXYzine (ATARAX/VISTARIL) 25 MG tablet Take 0.5 tablets (12.5 mg total) by mouth every 8 (eight) hours as needed for itching.  ? ipratropium-albuterol (DUONEB)  0.5-2.5 (3) MG/3ML SOLN Take 3 mLs by nebulization every 6 (six) hours as needed.  ? lamoTRIgine (LAMICTAL) 100 MG tablet Take 1 tablet (100 mg total) by mouth 2 (two) times daily.  ? losartan (COZAAR) 50 MG tablet Take 0.5 tablets (25 mg total) by mouth daily.  ? Multiple Vitamin (MULTIVITAMIN WITH MINERALS) TABS tablet Take 1 tablet by mouth daily.  ? potassium chloride SA (KLOR-CON M) 20 MEQ tablet Take 1 tablet (20 mEq total) by mouth daily.  ? progesterone (PROMETRIUM) 200 MG capsule Take 200 mg by mouth at bedtime.  ? ?No facility-administered encounter medications on file as of 11/23/2021.  ? ? ?Past Medical History:  ?Diagnosis Date  ? Seizures ([place])   ? none in years, recently had medication change  ? ? ?Past Surgical History:  ?Procedure Laterality Date  ? RIGHT/LEFT HEART CATH AND CORONARY ANGIOGRAPHY N/A 11/05/2021  ? Procedure: RIGHT/LEFT HEART CATH AND CORONARY ANGIOGRAPHY;  Surgeon: Belva Crome, MD;  Location: [facility];  Service: Cardiovascular;  Laterality: N/A;  ? ? ?Family History  ?Problem Relation Age of Onset  ? Diabetes Mother   ? Cancer Mother   ?     breast  ? Stroke Mother   ? Heart Problems Father   ? Diabetes Sister   ? ? ?Social History  ? ?Socioeconomic History  ? Marital status: Significant Other  ?  Spouse name: Janace Hoard  ? Number of children: 1  ?  Years of education: Not on file  ? Highest education level: Bachelor's degree (e.g., BA, AB, BS)  ?Occupational History  ? Occupation: retired  ?Tobacco Use  ? Smoking status: Former  ?  Packs/day: 1.00  ?  Years: 52.00  ?  Pack years: 52.00  ?  Types: Cigarettes  ?  Quit date: 11/09/2021  ?  Years since quitting: 0.0  ? Smokeless tobacco: Never  ? Tobacco comments:  ?  Currently 1 pack every 2-3 days  ?Vaping Use  ? Vaping Use: Never used  ?Substance and Sexual Activity  ? Alcohol use: Yes  ?  Comment: socially  ? Drug use: No  ? Sexual activity: Not on file  ?Other Topics Concern  ? Not on file  ?Social History Narrative  ? Not  on file  ? ?Social Determinants of Health  ? ?Financial Resource Strain: Low Risk   ? Difficulty of Paying Living Expenses: Not hard at all  ?Food Insecurity: No Food Insecurity  ? Worried About Charity fundraiser in the Last Year: Never true  ? Ran Out of Food in the Last Year: Never true  ?Transportation Needs: No Transportation Needs  ? Lack of Transportation (Medical): No  ? Lack of Transportation (Non-Medical): No  ?Physical Activity: Not on file  ?Stress: Not on file  ?Social Connections: Not on file  ?Intimate Partner Violence: Not on file  ? ? ?  11/23/2021  ? 11:17 AM  ?Depression screen PHQ 2/9  ?Decreased Interest 1  ?Down, Depressed, Hopeless 1  ?PHQ - 2 Score 2  ?Altered sleeping 0  ?Tired, decreased energy 1  ?Change in appetite 0  ?Feeling bad or failure about yourself  1  ?Trouble concentrating 0  ?Moving slowly or fidgety/restless 0  ?Suicidal thoughts 0  ?PHQ-9 Score 4  ?Difficult doing work/chores Not difficult at all  ? ? ? ? ?ROS ?Review of Systems:  ?A fourteen system review of systems was performed and found to be positive as per HPI. ? ?  ? ? ?Objective   ? ?BP 120/68   Pulse 81   Temp 97.7 ?F (36.5 ?C)   Ht _0  (1.575 m)   Wt 86 lb 12 oz (39.3 kg)   SpO2 100%   BMI 15.87 kg/m?  ? ?Physical Exam ?General:  Well Developed, well nourished, appropriate for stated age.  ?Neuro:  Alert and oriented,  extra-ocular muscles intact  ?HEENT:  Normocephalic, atraumatic, neck supple  ?Skin:  no gross rash, warm, pink. ?Cardiac:  RRR, S1 S2 ?Respiratory: CTA B/L  ?Vascular:  Ext warm, no cyanosis apprec.; cap RF less 2 sec. No pitting edema.  ?Psych:  No HI/SI, judgement and insight good, Euthymic mood. Full Affect. ? ? ?  ? ?Assessment & Plan:  ? ?Problem List Items Addressed This Visit   ? ?  ? Cardiovascular and Mediastinum  ? NSTEMI (non-ST elevated myocardial infarction) ([place])  ?  ? Respiratory  ? COPD with chronic bronchitis ([place]) - Primary  ?  ? Nervous and Auditory  ? Complex partial  seizure ([place])  ?  ? Other  ? Anxiety  ? ?Other Visit Diagnoses   ? ? Chronic systolic heart failure ([place])      ? Coronary artery disease involving native coronary artery of native heart without angina pectoris      ? Encounter to establish care      ? Plantar verruca      ? ?  ? ?Chronic systolic heart  failure: ?-Reviewed hospital notes, labs and imaging. ?-Echocardiogram 11/02/2021: 1. Apical hypokinesis with remaining walls hypokinetic; overall mild to  ?moderate LV dysfunction; calcified aortic valve but no AS by doppler;  ?moderate MAC with mild mitral stenosis.  ? 2. Left ventricular ejection fraction, by estimation, is 40 to 45%. The  ?left ventricle has mild to moderately decreased function. The left  ?ventricle demonstrates regional wall motion abnormalities (see scoring  ?diagram/findings for description). There is  ? mild left ventricular hypertrophy. Left ventricular diastolic parameters  ?are consistent with Grade I diastolic dysfunction (impaired relaxation).  ?Elevated left atrial pressure.  ? 3. Right ventricular systolic function is normal. The right ventricular  ?size is normal.  ? 4. The mitral valve is normal in structure. Trivial mitral valve  ?regurgitation. Mild mitral stenosis. Moderate mitral annular  ?calcification.  ? 5. The aortic valve is calcified. Aortic valve regurgitation is trivial.  ?No aortic stenosis is present.  ? 6. The inferior vena cava is normal in size with greater than 50%  ?respiratory variability, suggesting right atrial pressure of 3 mmHg.  ?-Followed by cardiology. ?-On diuretic therapy with Lasix 20 mg, carvedilol 6.25 mg BID, Losartan 25 mg, and Jardiance 10 mg.  ? ?NSTEMI: ?-Reviewed hospital notes. ?-Patient on beta blocker, aspirin and high potency statin. ? ?Coronary artery disease involving native coronary artery of native heart without angina pectoris: ?-Recommend to continue tobacco cessation, atorvastatin 80 mg, aspirin.   ?-Cardiac  catheterization:CONCLUSIONS ?Widely patent left main ?Widely patent LAD wrapping around the left ventricular apex.  Large first diagonal contains calcified eccentric 70 to 80% stenosis. ?[place] 111 mid circumflex bifurcation stenosis with with

## 2021-11-25 ENCOUNTER — Ambulatory Visit (HOSPITAL_COMMUNITY)
Admission: RE | Admit: 2021-11-25 | Discharge: 2021-11-25 | Disposition: A | Discharge status: Home / Self Care | Payer: Medicare Other | Source: Ambulatory Visit | Attending: Physician Assistant | Admitting: Physician Assistant

## 2021-11-25 ENCOUNTER — Telehealth (HOSPITAL_COMMUNITY): Payer: Self-pay | Admitting: *Deleted

## 2021-11-25 ENCOUNTER — Encounter (HOSPITAL_COMMUNITY): Payer: Self-pay

## 2021-11-25 ENCOUNTER — Ambulatory Visit
Admission: RE | Admit: 2021-11-25 | Discharge: 2021-11-25 | Disposition: A | Discharge status: Home / Self Care | Payer: Medicare Other | Source: Ambulatory Visit | Attending: Obstetrics and Gynecology | Admitting: Obstetrics and Gynecology

## 2021-11-25 VITALS — BP 106/74 | HR 57 | Wt 87.0 lb

## 2021-11-25 DIAGNOSIS — I7300 Raynaud's syndrome without gangrene: Secondary | ICD-10-CM | POA: Insufficient documentation

## 2021-11-25 DIAGNOSIS — I2510 Atherosclerotic heart disease of native coronary artery without angina pectoris: Secondary | ICD-10-CM | POA: Insufficient documentation

## 2021-11-25 DIAGNOSIS — J449 Chronic obstructive pulmonary disease, unspecified: Secondary | ICD-10-CM | POA: Insufficient documentation

## 2021-11-25 DIAGNOSIS — Z87891 Personal history of nicotine dependence: Secondary | ICD-10-CM | POA: Diagnosis not present

## 2021-11-25 DIAGNOSIS — Z79899 Other long term (current) drug therapy: Secondary | ICD-10-CM | POA: Insufficient documentation

## 2021-11-25 DIAGNOSIS — Z7984 Long term (current) use of oral hypoglycemic drugs: Secondary | ICD-10-CM | POA: Diagnosis not present

## 2021-11-25 DIAGNOSIS — I5042 Chronic combined systolic (congestive) and diastolic (congestive) heart failure: Secondary | ICD-10-CM | POA: Insufficient documentation

## 2021-11-25 DIAGNOSIS — R42 Dizziness and giddiness: Secondary | ICD-10-CM | POA: Diagnosis not present

## 2021-11-25 DIAGNOSIS — I5022 Chronic systolic (congestive) heart failure: Secondary | ICD-10-CM

## 2021-11-25 DIAGNOSIS — N6489 Other specified disorders of breast: Secondary | ICD-10-CM

## 2021-11-25 DIAGNOSIS — F419 Anxiety disorder, unspecified: Secondary | ICD-10-CM | POA: Insufficient documentation

## 2021-11-25 DIAGNOSIS — R569 Unspecified convulsions: Secondary | ICD-10-CM | POA: Diagnosis not present

## 2021-11-25 DIAGNOSIS — Z7982 Long term (current) use of aspirin: Secondary | ICD-10-CM | POA: Insufficient documentation

## 2021-11-25 DIAGNOSIS — R001 Bradycardia, unspecified: Secondary | ICD-10-CM | POA: Insufficient documentation

## 2021-11-25 LAB — BASIC METABOLIC PANEL
Anion gap: 7 (ref 5–15)
BUN: 31 mg/dL — ABNORMAL HIGH (ref 8–23)
CO2: 26 mmol/L (ref 22–32)
Calcium: 8.8 mg/dL — ABNORMAL LOW (ref 8.9–10.3)
Chloride: 104 mmol/L (ref 98–111)
Creatinine, Ser: 1.19 mg/dL — ABNORMAL HIGH (ref 0.44–1.00)
GFR, Estimated: 50 mL/min — ABNORMAL LOW (ref >60)
Glucose, Bld: 89 mg/dL (ref 70–99)
Potassium: 4.4 mmol/L (ref 3.5–5.1)
Sodium: 137 mmol/L (ref 135–145)

## 2021-11-25 MED ORDER — CARVEDILOL 3.125 MG PO TABS: 3.1250 mg | ORAL_TABLET | Freq: Two times a day (BID) | ORAL | 0 refills | Status: DC

## 2021-11-25 NOTE — Progress Notes (Signed)
? ? ?HEART & VASCULAR TRANSITION OF CARE PROGRESS NOTE  ? ? ? ?Referring Physician:Dr Arrien  ?Primary Care: Christie Reid, PA-C  ?Primary Cardiologist: Dr Gwenlyn Found ? ?HPI: ?Referred to clinic by Dr Cathlean Sauer for heart failure consultation.  ? ?Christie Moreno is a 66 year old with a history of COPD, seizures, anxiety, CAD, tobacco abuse, [place], and HFmEF.  ? ?Arrived to [facility] on 11/02/21 via EMS due to acute dyspnea. BNP elevated. Echo  EF 40-45%. Diuresed with IV lasix. Cath with nonobstructive CAD. Started on bb, sglt2i, and losartan.  ? ?Had first [facility] clinic appt on 4/12. At visit, reported feeling tired.  Denied SOB/PND/Orthopnea. Was having a hard time walking around the store due to foot pain. Says she has plantar warts.  Appetite poor. Denied fever or chills. Weight at home 82-84 pounds. Was still smoking cigs, 1 pack every 2-3 days. Reported taking all medications. She was felt to be dry on exam and low BP limited titration of GDMT. Lasix was reduced to 20 mg daily and Losartan reduced from 50 mg to 25 mg qhs. Jardiance and Coreg were continued.  ? ?Since that time, she has established care with a new PCP. Seen 4/24. BP was normotensive at visit, charted at 120/68. No med changes were made.  ? ?She returns back today for f/u. Continues to note fatigue and lightheaded but no exertional dyspnea w/ basic ADLs. Mild dyspnea if she overexerts herself. BP remains soft, 106/75. Pulse rate low, 57 bmp. Volume status stable on exam and by ReDs assessment, 28%.  ? ?She has quit smoking.  ? ?Cardiac Testing  ?Cath 11/05/21  ?OM containing 30, 75, 40% stenoses. ?Diffuse irregularities up to 30% proximal to distal RCA.  PDA 30 to 40%. ?CI 2.76 PA sat 66%.  PCWP 19 ? ?Echo 11/02/2021- Apical HK LVEF 40-45%  ? ?Review of Systems: [y] = yes, '[ ]'$  = no  ? ?General: Weight gain '[ ]'$ ; Weight loss '[ ]'$ ; Anorexia '[ ]'$ ; Fatigue [ Y]; Fever '[ ]'$ ; Chills '[ ]'$ ; Weakness [ Y]  ?Cardiac: Chest pain/pressure '[ ]'$ ; Resting SOB '[ ]'$ ; Exertional SOB '[ ]'$ ;  Orthopnea '[ ]'$ ; Pedal Edema '[ ]'$ ; Palpitations '[ ]'$ ; Syncope '[ ]'$ ; Presyncope '[ ]'$ ; Paroxysmal nocturnal dyspnea'[ ]'$   ?Pulmonary: Cough '[ ]'$ ; Wheezing'[ ]'$ ; Hemoptysis'[ ]'$ ; Sputum '[ ]'$ ; Snoring '[ ]'$   ?GI: Vomiting'[ ]'$ ; Dysphagia'[ ]'$ ; Melena'[ ]'$ ; Hematochezia '[ ]'$ ; Heartburn'[ ]'$ ; Abdominal pain '[ ]'$ ; Constipation '[ ]'$ ; Diarrhea '[ ]'$ ; BRBPR '[ ]'$   ?GU: Hematuria'[ ]'$ ; Dysuria '[ ]'$ ; Nocturia'[ ]'$   ?Vascular: Pain in legs with walking [Y ]; Pain in feet with lying flat '[ ]'$ ; Non-healing sores '[ ]'$ ; Stroke '[ ]'$ ; TIA '[ ]'$ ; Slurred speech '[ ]'$ ;  ?Neuro: Headaches'[ ]'$ ; Vertigo'[ ]'$ ; Seizures[ Y]; Paresthesias'[ ]'$ ;Blurred vision '[ ]'$ ; Diplopia '[ ]'$ ; Vision changes '[ ]'$   ?Ortho/Skin: Arthritis '[ ]'$ ; Joint pain [ Y]; Muscle pain '[ ]'$ ; Joint swelling '[ ]'$ ; Back Pain [ Y]; Rash '[ ]'$   ?Psych: Depression'[ ]'$ ; Anxiety'[ ]'$   ?Heme: Bleeding problems '[ ]'$ ; Clotting disorders '[ ]'$ ; Anemia '[ ]'$   ?Endocrine: Diabetes '[ ]'$ ; Thyroid dysfunction'[ ]'$  ? ? ?Past Medical History:  ?Diagnosis Date  ? Seizures ([place])   ? none in years, recently had medication change  ? ? ?Current Outpatient Medications  ?Medication Sig Dispense Refill  ? albuterol (PROVENTIL) (2.5 MG/3ML) 0.083% nebulizer solution Take 3 mLs (2.5 mg total) by nebulization every 6 (six) hours as needed for wheezing or shortness  of breath. 90 mL 12  ? aspirin 81 MG EC tablet Take 1 tablet (81 mg total) by mouth daily. Swallow whole. 30 tablet 0  ? atorvastatin (LIPITOR) 80 MG tablet Take 1 tablet (80 mg total) by mouth daily at 6 PM. 30 tablet 0  ? carvedilol (COREG) 6.25 MG tablet Take 1 tablet (6.25 mg total) by mouth 2 (two) times daily with a meal. 60 tablet 0  ? empagliflozin (JARDIANCE) 10 MG TABS tablet Take 1 tablet (10 mg total) by mouth daily. 30 tablet 5  ? estrogens, conjugated, (PREMARIN) 1.25 MG tablet Take 1.25 mg by mouth daily.    ? feeding supplement (ENSURE ENLIVE / ENSURE PLUS) LIQD Take 237 mLs by mouth 2 (two) times daily between meals. 237 mL 0  ? furosemide (LASIX) 40 MG tablet Take 0.5 tablets (20 mg total)  by mouth daily. 15 tablet 0  ? hydrOXYzine (ATARAX/VISTARIL) 25 MG tablet Take 0.5 tablets (12.5 mg total) by mouth every 8 (eight) hours as needed for itching. 30 tablet 0  ? ipratropium-albuterol (DUONEB) 0.5-2.5 (3) MG/3ML SOLN Take 3 mLs by nebulization every 6 (six) hours as needed. 360 mL 0  ? lamoTRIgine (LAMICTAL) 100 MG tablet Take 1 tablet (100 mg total) by mouth 2 (two) times daily. 60 tablet 11  ? losartan (COZAAR) 50 MG tablet Take 0.5 tablets (25 mg total) by mouth daily. 15 tablet 0  ? Multiple Vitamin (MULTIVITAMIN WITH MINERALS) TABS tablet Take 1 tablet by mouth daily. 30 tablet 0  ? potassium chloride SA (KLOR-CON M) 20 MEQ tablet Take 1 tablet (20 mEq total) by mouth daily. 30 tablet 0  ? progesterone (PROMETRIUM) 200 MG capsule Take 200 mg by mouth at bedtime.    ? ?No current facility-administered medications for this encounter.  ? ? ?Allergies  ?Allergen Reactions  ? Azithromycin Nausea And Vomiting  ? Erythromycin   ?  Sensitivity (GI)  ? Percocet [Oxycodone-Acetaminophen]   ? Penicillins Itching and Rash  ? ? ?  ?Social History  ? ?Socioeconomic History  ? Marital status: Significant Other  ?  Spouse name: Christie Moreno  ? Number of children: 1  ? Years of education: Not on file  ? Highest education level: Bachelor's degree (e.g., BA, AB, BS)  ?Occupational History  ? Occupation: retired  ?Tobacco Use  ? Smoking status: Former  ?  Packs/day: 1.00  ?  Years: 52.00  ?  Pack years: 52.00  ?  Types: Cigarettes  ?  Quit date: 11/09/2021  ?  Years since quitting: 0.0  ? Smokeless tobacco: Never  ? Tobacco comments:  ?  Currently 1 pack every 2-3 days  ?Vaping Use  ? Vaping Use: Never used  ?Substance and Sexual Activity  ? Alcohol use: Yes  ?  Comment: socially  ? Drug use: No  ? Sexual activity: Not on file  ?Other Topics Concern  ? Not on file  ?Social History Narrative  ? Not on file  ? ?Social Determinants of Health  ? ?Financial Resource Strain: Low Risk   ? Difficulty of Paying Living Expenses:  Not hard at all  ?Food Insecurity: No Food Insecurity  ? Worried About Charity fundraiser in the Last Year: Never true  ? Ran Out of Food in the Last Year: Never true  ?Transportation Needs: No Transportation Needs  ? Lack of Transportation (Medical): No  ? Lack of Transportation (Non-Medical): No  ?Physical Activity: Not on file  ?Stress: Not on file  ?  Social Connections: Not on file  ?Intimate Partner Violence: Not on file  ? ? ?  ?Family History  ?Problem Relation Age of Onset  ? Breast cancer Mother   ? Diabetes Mother   ? Cancer Mother   ?     breast  ? Stroke Mother   ? Heart Problems Father   ? Diabetes Sister   ? Breast cancer Maternal Aunt   ? ? ?Vitals:  ? 11/25/21 1409  ?BP: 106/74  ?Pulse: (!) 57  ?SpO2: 99%  ?Weight: 39.5 kg (87 lb)  ? ? ?Wt Readings from Last 3 Encounters:  ?11/25/21 39.5 kg (87 lb)  ?11/23/21 39.3 kg (86 lb 12 oz)  ?11/11/21 38.3 kg (84 lb 6.4 oz)  ? ?PHYSICAL EXAM: ?ReDs Clip 28%  ?General:  thin appearing middle aged female No respiratory difficulty ?HEENT: normal ?Neck: supple. no JVD. Carotids 2+ bilat; no bruits. No lymphadenopathy or thyromegaly appreciated. ?Cor: PMI nondisplaced. Regular rate & rhythm. No rubs, gallops or murmurs. ?Lungs: clear ?Abdomen: soft, nontender, nondistended. No hepatosplenomegaly. No bruits or masses. Good bowel sounds. ?Extremities: no cyanosis, clubbing, rash, edema ?Neuro: alert & oriented x 3, cranial nerves grossly intact. moves all 4 extremities w/o difficulty. Affect pleasant. ? ?ECG:not performed  ? ?ASSESSMENT & PLAN: ?1. HFmEF/HFpEF  ?Echo EF 40-45%.  ?NYHA II. Euvolemic on exam. ReDs Clip 28% ?GDMT limited by soft blood pressure and prior dizziness, requiring recent dose titration of losartan ?- Continue Losartan 25 mg qhs ?- reduce Coreg to 3.125 mg bid given soft BP, fatigue and bradycardia  ?-MRA- Hold off for now with soft BP  ?-Continue SGLT2i Jardiance 10 mg daily ?-Continue Lasix 20 mg daily  ?-Check BMP today  ?-Repeat ECHO after  HF meds optimized.  ? ?2. CAD ?-LHC 11/05/21 non critical CAD ?-Stable w/o CP  ?-Continue atorvastatin 80 mg daily, aspirin, and coreg.  ? ?3. Tobacco Abuse ?-Heavy smoker for many years.  ?-Recently quit.

## 2021-11-25 NOTE — Patient Instructions (Addendum)
Labs done today. We will contact you only if your labs are abnormal. ? ?DECREASE Carvedilol to 3.'125mg'$  (1 tablet) by mouth 2 times daily. ? ?No other medication changes were made. Please continue all current medications as prescribed. ? ?Please keep your pending follow up with Dr. Gwenlyn Found.  ? ?Thank you for allowing [place] to provide your heart failure care after your recent hospitalization.  ? ?

## 2021-11-25 NOTE — Telephone Encounter (Signed)
Called to confirm Heart & Vascular Transitions of Care appointment at 2 pm on 11/25/21. Patient reminded to bring all medications and pill box organizer with them. Confirmed patient has transportation. Gave directions, instructed to utilize [place] parking. ? ?Confirmed appointment prior to ending call.   ? ?Earnestine Leys, BSN, RN ?Heart Failure Nurse Navigator ?Secure Chat Only  ?

## 2021-12-06 ENCOUNTER — Telehealth: Payer: Self-pay | Admitting: Physician Assistant

## 2021-12-06 MED ORDER — ATORVASTATIN CALCIUM 80 MG PO TABS
80.0000 mg | ORAL_TABLET | Freq: Every day | ORAL | 11 refills | Status: DC
Start: 2021-12-06 — End: 2022-03-30

## 2021-12-06 MED ORDER — POTASSIUM CHLORIDE CRYS ER 20 MEQ PO TBCR: 20.0000 meq | EXTENDED_RELEASE_TABLET | Freq: Every day | ORAL | 11 refills | Status: DC

## 2021-12-06 NOTE — Telephone Encounter (Signed)
Pt called in for refills on ASA, Lipitor and K+. ?She has been seen with [facility] clinic and has f/u with Dr. Gwenlyn Found in a couple of weeks. ?I advised her to get ASA OTC. ?K+ and Lipitor sent to her pharmacy. ?Richardson Dopp, PA-C    ?12/06/2021 1:17 PM   ?

## 2021-12-09 ENCOUNTER — Other Ambulatory Visit (HOSPITAL_COMMUNITY): Payer: Self-pay | Admitting: Cardiology

## 2021-12-14 ENCOUNTER — Other Ambulatory Visit: Payer: Self-pay

## 2021-12-14 DIAGNOSIS — I5021 Acute systolic (congestive) heart failure: Secondary | ICD-10-CM

## 2021-12-14 MED ORDER — EMPAGLIFLOZIN 10 MG PO TABS: 10.0000 mg | ORAL_TABLET | Freq: Every day | ORAL | 3 refills | Status: DC

## 2021-12-23 ENCOUNTER — Telehealth: Payer: Self-pay | Admitting: Cardiovascular Disease

## 2021-12-23 ENCOUNTER — Other Ambulatory Visit: Payer: Self-pay | Admitting: Cardiovascular Disease

## 2021-12-23 ENCOUNTER — Ambulatory Visit (INDEPENDENT_AMBULATORY_CARE_PROVIDER_SITE_OTHER): Payer: Medicare Other | Admitting: Cardiovascular Disease

## 2021-12-23 ENCOUNTER — Encounter: Payer: Self-pay | Admitting: Cardiovascular Disease

## 2021-12-23 DIAGNOSIS — F17200 Nicotine dependence, unspecified, uncomplicated: Secondary | ICD-10-CM | POA: Diagnosis not present

## 2021-12-23 DIAGNOSIS — I214 Non-ST elevation (NSTEMI) myocardial infarction: Secondary | ICD-10-CM

## 2021-12-23 DIAGNOSIS — I2510 Atherosclerotic heart disease of native coronary artery without angina pectoris: Secondary | ICD-10-CM | POA: Diagnosis not present

## 2021-12-23 DIAGNOSIS — I5021 Acute systolic (congestive) heart failure: Secondary | ICD-10-CM

## 2021-12-23 MED ORDER — LOSARTAN POTASSIUM 50 MG PO TABS: 25.0000 mg | ORAL_TABLET | Freq: Every day | ORAL | 3 refills | Status: DC

## 2021-12-23 MED ORDER — LOSARTAN POTASSIUM 50 MG PO TABS: 25.0000 mg | ORAL_TABLET | Freq: Every day | ORAL | 0 refills | Status: DC

## 2021-12-23 NOTE — Patient Instructions (Signed)
Medication Instructions:  Your physician recommends that you continue on your current medications as directed. Please refer to the Current Medication list given to you today.  *If you need a refill on your cardiac medications before your next appointment, please call your pharmacy*   Testing/Procedures: Your physician has requested that you have a carotid duplex. This test is an ultrasound of the carotid arteries in your neck. It looks at blood flow through these arteries that supply the brain with blood. Allow one hour for this exam. There are no restrictions or special instructions. This procedure will be done at [address]   Your physician has requested that you have an echocardiogram. Echocardiography is a painless test that uses sound waves to create images of your heart. It provides your doctor with information about the size and shape of your heart and how well your heart's chambers and valves are working. This procedure takes approximately one hour. There are no restrictions for this procedure. To be done in August. This procedure will be done at [address]    Follow-Up: At [facility], you and your health needs are our priority.  As part of our continuing mission to provide you with exceptional heart care, we have created designated Provider Care Teams.  These Care Teams include your primary Cardiologist (physician) and Advanced Practice Providers (APPs -  Physician Assistants and Nurse Practitioners) who all work together to provide you with the care you need, when you need it.  We recommend signing up for the patient portal called "MyChart".  Sign up information is provided on this After Visit Summary.  MyChart is used to connect with patients for Virtual Visits (Telemedicine).  Patients are able to view lab/test results, encounter notes, upcoming appointments, etc.  Non-urgent messages can be sent to your provider as well.   To learn more about what  you can do with MyChart, go to [email or web address].    Your next appointment:   3 month(s)  The format for your next appointment:   In Person  Provider:   Quay Burow, MD

## 2021-12-23 NOTE — Telephone Encounter (Signed)
*  STAT* If patient is at the pharmacy, call can be transferred to refill team.   1. Which medications need to be refilled? (please list name of each medication and dose if known) losartan (COZAAR) 50 MG tablet  2. Which pharmacy/location (including street and city if local pharmacy) is medication to be sent to? [facility] [place], [place]  3. Do they need a 30 day or 90 day supply? 90  Patient said she is not sure if she told her the right medication in her appt today and wanted to state this medication was the one that needed to be filled and that it can be sent to [facility] or [facility] on [address]

## 2021-12-23 NOTE — Progress Notes (Signed)
12/23/2021 Allene Pyo   02-Oct-1955  [id number]  Primary Physician Lorrene Reid, PA-C Primary Cardiologist: Lorretta Harp MD Garret Reddish, [place], [place]  HPI:  Christie Moreno is a 66 y.o. thin-appearing single Caucasian female mother of 1 daughter, grandmother of 4 grandchildren who was recently hospitalized in early April with systolic heart failure.  She did have a non-STEMI with troponins that rose to 1294.  She has a history of discontinue tobacco abuse.  Right left heart cath revealed no significant obstructive disease by Dr. Tamala Julian.  Her EF was 40 to 07% with diastolic dysfunction.  She was diuresed and discharged home.  She has seen the advanced heart failure service on outpatient basis.  Her medications have been optimized.   Current Meds  Medication Sig   albuterol (PROVENTIL) (2.5 MG/3ML) 0.083% nebulizer solution Take 3 mLs (2.5 mg total) by nebulization every 6 (six) hours as needed for wheezing or shortness of breath.   aspirin 81 MG EC tablet Take 1 tablet (81 mg total) by mouth daily. Swallow whole.   atorvastatin (LIPITOR) 80 MG tablet Take 1 tablet (80 mg total) by mouth daily at 6 PM.   carvedilol (COREG) 3.125 MG tablet Take 1 tablet (3.125 mg total) by mouth 2 (two) times daily with a meal.   empagliflozin (JARDIANCE) 10 MG TABS tablet Take 1 tablet (10 mg total) by mouth daily.   estrogens, conjugated, (PREMARIN) 1.25 MG tablet Take 1.25 mg by mouth daily.   feeding supplement (ENSURE ENLIVE / ENSURE PLUS) LIQD Take 237 mLs by mouth 2 (two) times daily between meals.   hydrOXYzine (ATARAX/VISTARIL) 25 MG tablet Take 0.5 tablets (12.5 mg total) by mouth every 8 (eight) hours as needed for itching.   ipratropium-albuterol (DUONEB) 0.5-2.5 (3) MG/3ML SOLN Take 3 mLs by nebulization every 6 (six) hours as needed.   lamoTRIgine (LAMICTAL) 100 MG tablet Take 1 tablet (100 mg total) by mouth 2 (two) times daily.   potassium chloride SA (KLOR-CON M) 20 MEQ tablet  Take 1 tablet (20 mEq total) by mouth daily.   progesterone (PROMETRIUM) 200 MG capsule Take 200 mg by mouth at bedtime.     Allergies  Allergen Reactions   Azithromycin Nausea And Vomiting   Erythromycin     Sensitivity (GI)   Percocet [Oxycodone-Acetaminophen]    Penicillins Itching and Rash    Social History   Socioeconomic History   Marital status: Significant Other    Spouse name: Janace Hoard   Number of children: 1   Years of education: Not on file   Highest education level: Bachelor's degree (e.g., BA, AB, BS)  Occupational History   Occupation: retired  Tobacco Use   Smoking status: Former    Packs/day: 1.00    Years: 52.00    Pack years: 52.00    Types: Cigarettes    Quit date: 11/09/2021    Years since quitting: 0.1   Smokeless tobacco: Never   Tobacco comments:    Currently 1 pack every 2-3 days  Vaping Use   Vaping Use: Never used  Substance and Sexual Activity   Alcohol use: Yes    Comment: socially   Drug use: No   Sexual activity: Not on file  Other Topics Concern   Not on file  Social History Narrative   Not on file   Social Determinants of Health   Financial Resource Strain: Low Risk    Difficulty of Paying Living Expenses: Not hard at all  Food Insecurity: No Food Insecurity   Worried About Charity fundraiser in the Last Year: Never true   Ran Out of Food in the Last Year: Never true  Transportation Needs: No Transportation Needs   Lack of Transportation (Medical): No   Lack of Transportation (Non-Medical): No  Physical Activity: Not on file  Stress: Not on file  Social Connections: Not on file  Intimate Partner Violence: Not on file     Review of Systems: General: negative for chills, fever, night sweats or weight changes.  Cardiovascular: negative for chest pain, dyspnea on exertion, edema, orthopnea, palpitations, paroxysmal nocturnal dyspnea or shortness of breath Dermatological: negative for rash Respiratory: negative for cough or  wheezing Urologic: negative for hematuria Abdominal: negative for nausea, vomiting, diarrhea, bright red blood per rectum, melena, or hematemesis Neurologic: negative for visual changes, syncope, or dizziness All other systems reviewed and are otherwise negative except as noted above.    Blood pressure 110/72, height '5\' 2"'$  (1.575 m), weight 88 lb 12.8 oz (40.3 kg).  General appearance: alert and no distress Neck: no adenopathy, no JVD, supple, symmetrical, trachea midline, thyroid not enlarged, symmetric, no tenderness/mass/nodules, and bilateral carotid bruits Lungs: clear to auscultation bilaterally Heart: regular rate and rhythm, S1, S2 normal, no murmur, click, rub or gallop Extremities: extremities normal, atraumatic, no cyanosis or edema Pulses: 2+ and symmetric Skin: Skin color, texture, turgor normal. No rashes or lesions Neurologic: Grossly normal  EKG not performed today  ASSESSMENT AND PLAN:   Tobacco dependence Discontinued  NSTEMI (non-ST elevated myocardial infarction) [facility]) Patient was admitted to the hospital in early April with a non-STEMI.  Her troponins peaked at 1294.  Cardiac catheterization revealed noncritical CAD.  Acute systolic CHF (congestive heart failure) (HCC) Ejection fraction by 2D echo was 40 to 45% with grade 1 diastolic dysfunction.  There were no significant valvular abnormalities.  She was seen by the heart failure service and medications were optimized.  She feels clinically improved.  I am going to get a repeat 2D echo in 3 months.     Lorretta Harp MD FACP,FACC,FAHA, [facility] 12/23/2021 11:14 AM

## 2021-12-23 NOTE — Assessment & Plan Note (Signed)
Patient was admitted to the hospital in early April with a non-STEMI.  Her troponins peaked at 1294.  Cardiac catheterization revealed noncritical CAD.

## 2021-12-23 NOTE — Addendum Note (Signed)
Addended by: Beatrix Fetters on: 12/23/2021 11:28 AM   Modules accepted: Orders

## 2021-12-23 NOTE — Assessment & Plan Note (Signed)
Ejection fraction by 2D echo was 40 to 45% with grade 1 diastolic dysfunction.  There were no significant valvular abnormalities.  She was seen by the heart failure service and medications were optimized.  She feels clinically improved.  I am going to get a repeat 2D echo in 3 months.

## 2021-12-23 NOTE — Assessment & Plan Note (Signed)
Discontinued

## 2021-12-23 NOTE — Addendum Note (Signed)
Addended by: Beatrix Fetters on: 12/23/2021 12:10 PM   Modules accepted: Orders

## 2021-12-24 ENCOUNTER — Telehealth: Payer: Self-pay | Admitting: Cardiovascular Disease

## 2021-12-24 NOTE — Telephone Encounter (Signed)
*  STAT* If patient is at the pharmacy, call can be transferred to refill team.   1. Which medications need to be refilled? (please list name of each medication and dose if known) furosemide (LASIX) 40 MG tablet   2. Which pharmacy/location (including street and city if local pharmacy) is medication to be sent to? [facility] - [place], [place] - [address]   3. Do they need a 30 day or 90 day supply? 90 day 

## 2021-12-31 ENCOUNTER — Other Ambulatory Visit (HOSPITAL_COMMUNITY): Payer: Self-pay

## 2022-01-04 ENCOUNTER — Other Ambulatory Visit: Payer: Self-pay | Admitting: Cardiovascular Disease

## 2022-01-04 ENCOUNTER — Ambulatory Visit (HOSPITAL_COMMUNITY)
Admission: RE | Admit: 2022-01-04 | Discharge: 2022-01-04 | Disposition: A | Discharge status: Home / Self Care | Payer: Medicare Other | Source: Ambulatory Visit | Attending: Cardiovascular Disease | Admitting: Cardiovascular Disease

## 2022-01-04 DIAGNOSIS — F17200 Nicotine dependence, unspecified, uncomplicated: Secondary | ICD-10-CM | POA: Insufficient documentation

## 2022-01-04 DIAGNOSIS — R0989 Other specified symptoms and signs involving the circulatory and respiratory systems: Secondary | ICD-10-CM | POA: Insufficient documentation

## 2022-01-04 DIAGNOSIS — I214 Non-ST elevation (NSTEMI) myocardial infarction: Secondary | ICD-10-CM | POA: Insufficient documentation

## 2022-01-04 DIAGNOSIS — I5021 Acute systolic (congestive) heart failure: Secondary | ICD-10-CM | POA: Diagnosis present

## 2022-01-14 ENCOUNTER — Telehealth: Payer: Self-pay | Admitting: Cardiovascular Disease

## 2022-01-14 MED ORDER — FUROSEMIDE 40 MG PO TABS: 20.0000 mg | ORAL_TABLET | Freq: Every day | ORAL | 4 refills | Status: DC

## 2022-01-14 MED ORDER — CARVEDILOL 3.125 MG PO TABS
3.1250 mg | ORAL_TABLET | Freq: Two times a day (BID) | ORAL | 3 refills | Status: DC
Start: 2022-01-14 — End: 2022-01-16

## 2022-01-14 NOTE — Telephone Encounter (Signed)
Calling back to make sure refill was sent. Please advise

## 2022-01-14 NOTE — Telephone Encounter (Signed)
*  STAT* If patient is at the pharmacy, call can be transferred to refill team.   1. Which medications need to be refilled? (please list name of each medication and dose if known) carvedilol (COREG) 3.125 MG tablet (Expired) furosemide (LASIX) 40 MG tablet (Expired)  2. Which pharmacy/location (including street and city if local pharmacy) is medication to be sent to?   [facility] [place], [place]  3. Do they need a 30 day or 90 day supply?  30 day  Patient only has 1 pill left of carvedilol and  1 week left of furosemide

## 2022-01-15 ENCOUNTER — Telehealth: Payer: Self-pay | Admitting: Cardiovascular Disease

## 2022-01-15 NOTE — Telephone Encounter (Signed)
Follow Up:      Patient is calling to check on her Carvedilol. Still have not received it. She is completely out of it.

## 2022-01-16 ENCOUNTER — Telehealth: Payer: Self-pay | Admitting: Physician Assistant

## 2022-01-16 MED ORDER — CARVEDILOL 3.125 MG PO TABS: 3.1250 mg | ORAL_TABLET | Freq: Two times a day (BID) | ORAL | 0 refills | Status: DC

## 2022-01-16 NOTE — Telephone Encounter (Signed)
   The patient called the answering service after-hours today. She had called in on 6/15 and 6/16 about refill of carvedilol as she is completely out. Has not heard anything back yet so called the provider answering service today. I see the refill dept sent this into [facility] on 6/15.  However, this is mail order so she needs a short term prescription sent in locally - offered 30 day fill but she requests a weeks' worth to allow her mail order rx to arrive. Carvedilol short term rx sent in for 7 days per pt request. She is aware to call back next week if any delay in receiving mail order med. Requested pharmacy was [facility] for this fill.  The patient verbalized understanding and gratitude.  Charlie Pitter, PA-C

## 2022-01-16 NOTE — Telephone Encounter (Signed)
The patient called answering service a second time today. She had not gotten a call from [facility] about her prescription being ready. I told her I sent it in when she initially called and recommended she check with the pharmacy to check on when it will be ready. She verbalized understanding and gratitude.

## 2022-01-22 ENCOUNTER — Other Ambulatory Visit (HOSPITAL_COMMUNITY): Payer: Self-pay | Admitting: Adult Health

## 2022-01-24 ENCOUNTER — Other Ambulatory Visit: Payer: Self-pay | Admitting: Physician Assistant

## 2022-02-12 ENCOUNTER — Other Ambulatory Visit: Payer: Self-pay | Admitting: Physician Assistant

## 2022-02-12 DIAGNOSIS — Z0000 Encounter for general adult medical examination without abnormal findings: Secondary | ICD-10-CM

## 2022-02-12 DIAGNOSIS — I5022 Chronic systolic (congestive) heart failure: Secondary | ICD-10-CM

## 2022-02-15 ENCOUNTER — Other Ambulatory Visit: Payer: Medicare Other

## 2022-02-15 DIAGNOSIS — Z0000 Encounter for general adult medical examination without abnormal findings: Secondary | ICD-10-CM

## 2022-02-15 DIAGNOSIS — I5022 Chronic systolic (congestive) heart failure: Secondary | ICD-10-CM

## 2022-02-17 ENCOUNTER — Other Ambulatory Visit: Payer: Medicare Other

## 2022-02-18 ENCOUNTER — Other Ambulatory Visit: Payer: Self-pay

## 2022-02-18 MED ORDER — LAMOTRIGINE 100 MG PO TABS: 100.0000 mg | ORAL_TABLET | Freq: Two times a day (BID) | ORAL | 11 refills | Status: DC

## 2022-02-18 NOTE — Progress Notes (Signed)
[facility]. Last Lamotrigine level within normal limits.

## 2022-02-22 ENCOUNTER — Ambulatory Visit (INDEPENDENT_AMBULATORY_CARE_PROVIDER_SITE_OTHER): Payer: Medicare Other | Admitting: Physician Assistant

## 2022-02-22 ENCOUNTER — Encounter: Payer: Self-pay | Admitting: Physician Assistant

## 2022-02-22 VITALS — BP 115/64 | HR 57 | Ht 61.81 in | Wt 92.4 lb

## 2022-02-22 DIAGNOSIS — Z1211 Encounter for screening for malignant neoplasm of colon: Secondary | ICD-10-CM | POA: Diagnosis not present

## 2022-02-22 DIAGNOSIS — Z780 Asymptomatic menopausal state: Secondary | ICD-10-CM | POA: Diagnosis not present

## 2022-02-22 DIAGNOSIS — Z0000 Encounter for general adult medical examination without abnormal findings: Secondary | ICD-10-CM | POA: Diagnosis not present

## 2022-02-22 DIAGNOSIS — E2839 Other primary ovarian failure: Secondary | ICD-10-CM | POA: Diagnosis not present

## 2022-02-22 NOTE — Progress Notes (Signed)
Subjective:   Christie Moreno is a 66 y.o. female who presents for Medicare Annual (Subsequent) preventive examination.  Review of Systems    Referred to PCP I connected with Allene Pyo on 02/22/22 by in person telemedicine application and verified that I am speaking with the correct person using two identifiers.   I discussed the limitations, risks, security and privacy concerns of performing an evaluation and management service by telephone and the availability of in person appointments. I also discussed with the patient responsible charge related to this service. The patient expressed understanding and verbally consented to this telephonic visit.   Locations of Patient:office  Location of Provider:office  List any persons and their role that are participating in this visit with patient:   Burhan Barham     Objective:    Today's Vitals   02/22/22 1345 02/22/22 1419  BP: (!) 141/69 115/64  Pulse: (!) 57   SpO2: 100%   Weight: 92 lb 6.4 oz (41.9 kg)   Height: 5' 1.81" (1.57 m)    Body mass index is 17 kg/m.     11/05/2021    7:40 PM 11/02/2021    6:44 AM  Advanced Directives  Does Patient Have a Medical Advance Directive?  No  Would patient like information on creating a medical advance directive? No - Patient declined     Current Medications (verified) Outpatient Encounter Medications as of 02/22/2022  Medication Sig   albuterol (PROVENTIL) (2.5 MG/3ML) 0.083% nebulizer solution Take 3 mLs (2.5 mg total) by nebulization every 6 (six) hours as needed for wheezing or shortness of breath.   aspirin 81 MG EC tablet Take 1 tablet (81 mg total) by mouth daily. Swallow whole.   atorvastatin (LIPITOR) 80 MG tablet Take 1 tablet (80 mg total) by mouth daily at 6 PM.   carvedilol (COREG) 3.125 MG tablet TAKE 1 TABLET(3.125 MG) BY MOUTH TWICE DAILY WITH A MEAL   empagliflozin (JARDIANCE) 10 MG TABS tablet Take 1 tablet (10 mg total) by mouth daily.   estrogens,  conjugated, (PREMARIN) 1.25 MG tablet Take 1.25 mg by mouth daily.   feeding supplement (ENSURE ENLIVE / ENSURE PLUS) LIQD Take 237 mLs by mouth 2 (two) times daily between meals.   furosemide (LASIX) 40 MG tablet Take 0.5 tablets (20 mg total) by mouth daily.   hydrOXYzine (ATARAX/VISTARIL) 25 MG tablet Take 0.5 tablets (12.5 mg total) by mouth every 8 (eight) hours as needed for itching.   ipratropium-albuterol (DUONEB) 0.5-2.5 (3) MG/3ML SOLN Take 3 mLs by nebulization every 6 (six) hours as needed.   lamoTRIgine (LAMICTAL) 100 MG tablet Take 1 tablet (100 mg total) by mouth 2 (two) times daily.   losartan (COZAAR) 50 MG tablet Take 0.5 tablets (25 mg total) by mouth daily.   potassium chloride SA (KLOR-CON M) 20 MEQ tablet Take 1 tablet (20 mEq total) by mouth daily.   progesterone (PROMETRIUM) 200 MG capsule Take 200 mg by mouth at bedtime.   No facility-administered encounter medications on file as of 02/22/2022.    Allergies (verified) Azithromycin, Erythromycin, Percocet [oxycodone-acetaminophen], and Penicillins   History: Past Medical History:  Diagnosis Date   Seizures ([place])    none in years, recently had medication change   Past Surgical History:  Procedure Laterality Date   RIGHT/LEFT HEART CATH AND CORONARY ANGIOGRAPHY N/A 11/05/2021   Procedure: RIGHT/LEFT HEART CATH AND CORONARY ANGIOGRAPHY;  Surgeon: Belva Crome, MD;  Location: [place] [facility];  Service: Cardiovascular;  Laterality: N/A;   Family History  Problem Relation Age of Onset   Breast cancer Mother    Diabetes Mother    Cancer Mother        breast   Stroke Mother    Heart Problems Father    Diabetes Sister    Breast cancer Maternal Aunt    Social History   Socioeconomic History   Marital status: Significant Other    Spouse name: Christie Moreno   Number of children: 1   Years of education: Not on file   Highest education level: Bachelor's degree (e.g., BA, AB, BS)  Occupational History    Occupation: retired  Tobacco Use   Smoking status: Former    Packs/day: 1.00    Years: 52.00    Total pack years: 52.00    Types: Cigarettes    Quit date: 11/09/2021    Years since quitting: 0.2   Smokeless tobacco: Never   Tobacco comments:    Currently 1 pack every 2-3 days  Vaping Use   Vaping Use: Never used  Substance and Sexual Activity   Alcohol use: Yes    Comment: socially   Drug use: No   Sexual activity: Not on file  Other Topics Concern   Not on file  Social History Narrative   Not on file   Social Determinants of Health   Financial Resource Strain: Medium Risk (02/22/2022)   Overall Financial Resource Strain (CARDIA)    Difficulty of Paying Living Expenses: Somewhat hard  Food Insecurity: No Food Insecurity (02/22/2022)   Hunger Vital Sign    Worried About Running Out of Food in the Last Year: Never true    Ran Out of Food in the Last Year: Never true  Transportation Needs: No Transportation Needs (02/22/2022)   PRAPARE - Hydrologist (Medical): No    Lack of Transportation (Non-Medical): No  Physical Activity: Inactive (02/22/2022)   Exercise Vital Sign    Days of Exercise per Week: 0 days    Minutes of Exercise per Session: 0 min  Stress: Stress Concern Present (02/22/2022)   [place]    Feeling of Stress : To some extent  Social Connections: Socially Isolated (02/22/2022)   Social Connection and Isolation Panel [NHANES]    Frequency of Communication with Friends and Family: Three times a week    Frequency of Social Gatherings with Friends and Family: Twice a week    Attends Religious Services: Never    Marine scientist or Organizations: No    Attends Archivist Meetings: Never    Marital Status: Widowed    Tobacco Counseling Counseling given: Not Answered Tobacco comments: Currently 1 pack every 2-3 days    Diabetic?no          Activities of Daily Living    02/22/2022    1:49 PM 11/23/2021   11:17 AM  In your present state of health, do you have any difficulty performing the following activities:  Hearing? 0 0  Vision? 0 0  Difficulty concentrating or making decisions? 0 0  Walking or climbing stairs? 0 1  Dressing or bathing? 0 0  Doing errands, shopping? 0 0    Patient Care Team: Lorrene Reid, PA-C as PCP - General (Physician Assistant)  Indicate any recent Medical Services you may have received from other than [facility] providers in the past year (date may be approximate).     Assessment:  This is a routine wellness examination for Christie Moreno.  Hearing/Vision screen No results found.  Dietary issues and exercise activities discussed:    Goals Addressed   None   Depression Screen    02/22/2022    1:48 PM 11/23/2021   11:17 AM  PHQ 2/9 Scores  PHQ - 2 Score 1 2  PHQ- 9 Score 3 4    Fall Risk    11/23/2021   11:18 AM  Fall Risk   Falls in the past year? 1  Number falls in past yr: 0  Injury with Fall? 0  Risk for fall due to : Impaired balance/gait;Impaired mobility  Follow up Falls evaluation completed    FALL RISK PREVENTION PERTAINING TO THE HOME:  Any stairs in or around the home? Yes  If so, are there any without handrails? No  Home free of loose throw rugs in walkways, pet beds, electrical cords, etc? Yes  Adequate lighting in your home to reduce risk of falls? Yes   ASSISTIVE DEVICES UTILIZED TO PREVENT FALLS:  Life alert? No  Use of a cane, walker or w/c? No  Grab bars in the bathroom? No  Shower chair or bench in shower? No  Elevated toilet seat or a handicapped toilet? No   TIMED UP AND GO:  Was the test performed? Yes .  Length of time to ambulate 10 feet: 10 sec.   Gait steady and fast without use of assistive device  Cognitive Function:    02/03/2021    2:48 PM  MMSE - Mini Mental State Exam  Orientation to time 5  Orientation to Place 5  Registration  3  Attention/ Calculation 0  Recall 3  Language- name 2 objects 2  Language- repeat 1  Language- follow 3 step command 3  Language- read & follow direction 1  Write a sentence 1  Copy design 1  Total score 25        02/22/2022    1:51 PM  6CIT Screen  What Year? 0 points  What month? 0 points  What time? 0 points  Count back from 20 0 points  Months in reverse 0 points  Repeat phrase 2 points  Total Score 2 points    Immunizations Immunization History  Administered Date(s) Administered   PFIZER(Purple Top)SARS-COV-2 Vaccination 12/30/2019, 01/20/2020    TDAP status: Due, Education has been provided regarding the importance of this vaccine. Advised may receive this vaccine at local pharmacy or Health Dept. Aware to provide a copy of the vaccination record if obtained from local pharmacy or Health Dept. Verbalized acceptance and understanding.  Flu Vaccine status: Due, Education has been provided regarding the importance of this vaccine. Advised may receive this vaccine at local pharmacy or Health Dept. Aware to provide a copy of the vaccination record if obtained from local pharmacy or Health Dept. Verbalized acceptance and understanding.  Pneumococcal vaccine status: Due, Education has been provided regarding the importance of this vaccine. Advised may receive this vaccine at local pharmacy or Health Dept. Aware to provide a copy of the vaccination record if obtained from local pharmacy or Health Dept. Verbalized acceptance and understanding.  Covid-19 vaccine status: Completed vaccines  Qualifies for Shingles Vaccine? Yes   Zostavax completed No   Shingrix Completed?: No.    Education has been provided regarding the importance of this vaccine. Patient has been advised to call insurance company to determine out of pocket expense if they have not yet received this vaccine. Advised may also  receive vaccine at local pharmacy or Health Dept. Verbalized acceptance and  understanding.  Screening Tests Health Maintenance  Topic Date Due   Pneumonia Vaccine 26+ Years old (1 - PCV) Never done   TETANUS/TDAP  Never done   COLONOSCOPY (Pts 45-69yr Insurance coverage will need to be confirmed)  Never done   Zoster Vaccines- Shingrix (1 of 2) Never done   DEXA SCAN  Never done   COVID-19 Vaccine (3 - Pfizer series) 03/16/2020   INFLUENZA VACCINE  03/02/2022   MAMMOGRAM  11/26/2023   Hepatitis C Screening  Completed   HPV VACCINES  Aged Out    Health Maintenance  Health Maintenance Due  Topic Date Due   Pneumonia Vaccine 68 Years old (1 - PCV) Never done   TETANUS/TDAP  Never done   COLONOSCOPY (Pts 45-436yrInsurance coverage will need to be confirmed)  Never done   Zoster Vaccines- Shingrix (1 of 2) Never done   DEXA SCAN  Never done   COVID-19 Vaccine (3 - Pfizer series) 03/16/2020    Colorectal cancer screening: Referral to GI placed n/a. Pt aware the office will call re: appt.  Mammogram status: Completed 10/27/2020. Repeat every year. Followed by OB/GYN.  Bone Density status: Ordered today. Pt provided with contact info and advised to call to schedule appt.  Lung Cancer Screening: (Low Dose CT Chest recommended if Age 878-80ears, 30 pack-year currently smoking OR have quit w/in 15years.) does not qualify.   Lung Cancer Screening Referral: n/a  Additional Screening:  Hepatitis C Screening: does not qualify; Completed n/a  Vision Screening: Recommended annual ophthalmology exams for early detection of glaucoma and other disorders of the eye. Is the patient up to date with their annual eye exam?  No  Who is the provider or what is the name of the office in which the patient attends annual eye exams? N/a If pt is not established with a provider, would they like to be referred to a provider to establish care? No .   Dental Screening: Recommended annual dental exams for proper oral hygiene  Community Resource Referral / Chronic Care  Management: CRR required this visit?  No   CCM required this visit?  No      Plan:     I have personally reviewed and noted the following in the patient's chart:   Medical and social history Use of alcohol, tobacco or illicit drugs  Current medications and supplements including opioid prescriptions.  Functional ability and status Nutritional status Physical activity Advanced directives List of other physicians Hospitalizations, surgeries, and ER visits in previous 12 months Vitals Screenings to include cognitive, depression, and falls Referrals and appointments  In addition, I have reviewed and discussed with patient certain preventive protocols, quality metrics, and best practice recommendations. A written personalized care plan for preventive services as well as general preventive health recommendations were provided to patient.     MaLorrene ReidPA-C   02/22/2022   Nurse Notes: face to face 25 min   Ms. VaCheyenne Adas Thank you for taking time to come for your Medicare Wellness Visit. I appreciate your ongoing commitment to your health goals. Please review the following plan we discussed and let me know if I can assist you in the future.   These are the goals we discussed:  Goals   None     This is a list of the screening recommended for you and due dates:  Health Maintenance  Topic Date Due   Pneumonia  Vaccine (1 - PCV) Never done   Tetanus Vaccine  Never done   Colon Cancer Screening  Never done   Zoster (Shingles) Vaccine (1 of 2) Never done   DEXA scan (bone density measurement)  Never done   COVID-19 Vaccine (3 - Pfizer series) 03/16/2020   Flu Shot  03/02/2022   Mammogram  11/26/2023   Hepatitis C Screening: USPSTF Recommendation to screen - Ages 18-79 yo.  Completed   HPV Vaccine  Aged Out

## 2022-02-25 ENCOUNTER — Other Ambulatory Visit: Payer: Medicare Other

## 2022-03-02 ENCOUNTER — Ambulatory Visit (HOSPITAL_COMMUNITY): Payer: Medicare Other | Attending: Cardiovascular Disease

## 2022-03-02 DIAGNOSIS — I5021 Acute systolic (congestive) heart failure: Secondary | ICD-10-CM | POA: Diagnosis present

## 2022-03-02 DIAGNOSIS — I214 Non-ST elevation (NSTEMI) myocardial infarction: Secondary | ICD-10-CM | POA: Insufficient documentation

## 2022-03-02 DIAGNOSIS — F17200 Nicotine dependence, unspecified, uncomplicated: Secondary | ICD-10-CM | POA: Diagnosis present

## 2022-03-02 LAB — ECHOCARDIOGRAM COMPLETE
AR max vel: 1.1 cm2
AV Area VTI: 1.03 cm2
AV Area mean vel: 0.98 cm2
AV Mean grad: 12 mmHg
AV Peak grad: 21.9 mmHg
Ao pk vel: 2.34 m/s
Area-P 1/2: 1.77 cm2
MV VTI: 1.15 cm2
P 1/2 time: 414 msec
S' Lateral: 3.2 cm

## 2022-03-03 ENCOUNTER — Other Ambulatory Visit: Payer: Medicare Other

## 2022-03-05 LAB — LIPID PANEL
Chol/HDL Ratio: 1.7 ratio (ref 0.0–4.4)
Cholesterol, Total: 156 mg/dL (ref 100–199)
HDL: 91 mg/dL (ref >39)
LDL Chol Calc (NIH): 53 mg/dL (ref 0–99)
Triglycerides: 57 mg/dL (ref 0–149)
VLDL Cholesterol Cal: 12 mg/dL (ref 5–40)

## 2022-03-05 LAB — CBC
Hematocrit: 37.3 % (ref 34.0–46.6)
Hemoglobin: 12.4 g/dL (ref 11.1–15.9)
MCH: 29.7 pg (ref 26.6–33.0)
MCHC: 33.2 g/dL (ref 31.5–35.7)
MCV: 89 fL (ref 79–97)
Platelets: 153 10*3/uL (ref 150–450)
RBC: 4.18 x10E6/uL (ref 3.77–5.28)
RDW: 13.4 % (ref 11.7–15.4)
WBC: 7.5 10*3/uL (ref 3.4–10.8)

## 2022-03-05 LAB — COMPREHENSIVE METABOLIC PANEL
ALT: 42 IU/L — ABNORMAL HIGH (ref 0–32)
AST: 56 IU/L — ABNORMAL HIGH (ref 0–40)
Albumin/Globulin Ratio: 1.8 (ref 1.2–2.2)
Albumin: 3.9 g/dL (ref 3.9–4.9)
Alkaline Phosphatase: 68 IU/L (ref 44–121)
BUN/Creatinine Ratio: 20 (ref 12–28)
BUN: 25 mg/dL (ref 8–27)
Bilirubin Total: 0.2 mg/dL (ref 0.0–1.2)
CO2: 17 mmol/L — ABNORMAL LOW (ref 20–29)
Calcium: 9.2 mg/dL (ref 8.7–10.3)
Chloride: 102 mmol/L (ref 96–106)
Creatinine, Ser: 1.24 mg/dL — ABNORMAL HIGH (ref 0.57–1.00)
Globulin, Total: 2.2 g/dL (ref 1.5–4.5)
Glucose: 71 mg/dL (ref 70–99)
Potassium: 4.3 mmol/L (ref 3.5–5.2)
Sodium: 139 mmol/L (ref 134–144)
Total Protein: 6.1 g/dL (ref 6.0–8.5)
eGFR: 47 mL/min/{1.73_m2} — ABNORMAL LOW (ref >59)

## 2022-03-05 LAB — TSH: TSH: 4.84 u[IU]/mL — ABNORMAL HIGH (ref 0.450–4.500)

## 2022-03-09 ENCOUNTER — Other Ambulatory Visit: Payer: Self-pay

## 2022-03-09 MED ORDER — LAMOTRIGINE 100 MG PO TABS: 100.0000 mg | ORAL_TABLET | Freq: Two times a day (BID) | ORAL | 1 refills | Status: DC

## 2022-03-29 ENCOUNTER — Other Ambulatory Visit: Payer: Self-pay | Admitting: Cardiovascular Disease

## 2022-03-30 ENCOUNTER — Other Ambulatory Visit: Payer: Self-pay

## 2022-03-30 NOTE — Telephone Encounter (Signed)
Received a call from the patient requesting a refill on Potassium, Furosemide and Lipitor. The patient states the pharmacy is ready to send the medication they are just waiting to hear back from our office. She states she would like to have a ninety day supply of the last sent to her mail order pharmacy as well and that she has fifteen tablets left.  Patient advised that I would send the refill request to the [facility] office. She voiced understanding.

## 2022-03-31 MED ORDER — POTASSIUM CHLORIDE CRYS ER 20 MEQ PO TBCR: 20.0000 meq | EXTENDED_RELEASE_TABLET | Freq: Every day | ORAL | 0 refills | Status: DC

## 2022-03-31 MED ORDER — ATORVASTATIN CALCIUM 80 MG PO TABS: 80.0000 mg | ORAL_TABLET | Freq: Every day | ORAL | 0 refills | Status: DC

## 2022-03-31 MED ORDER — FUROSEMIDE 40 MG PO TABS: 20.0000 mg | ORAL_TABLET | Freq: Every day | ORAL | 0 refills | Status: DC

## 2022-04-02 ENCOUNTER — Telehealth: Payer: Self-pay | Admitting: Cardiovascular Disease

## 2022-04-02 MED ORDER — ATORVASTATIN CALCIUM 80 MG PO TABS: 80.0000 mg | ORAL_TABLET | Freq: Every day | ORAL | 0 refills | Status: DC

## 2022-04-02 MED ORDER — POTASSIUM CHLORIDE CRYS ER 20 MEQ PO TBCR
20.0000 meq | EXTENDED_RELEASE_TABLET | Freq: Every day | ORAL | 0 refills | Status: DC
Start: 2022-04-02 — End: 2022-04-19

## 2022-04-02 NOTE — Telephone Encounter (Signed)
*  STAT* If patient is at the pharmacy, call can be transferred to refill team.   1. Which medications need to be refilled? (please list name of each medication and dose if known)  atorvastatin (LIPITOR) 80 MG tablet  potassium chloride SA (KLOR-CON M) 20 MEQ tablet    2. Which pharmacy/location (including street and city if local pharmacy) is medication to be sent to?   [facility] [place], [place]  3. Do they need a 30 day or 90 day supply?  10 days   Pt is requesting a  10 day supply on medications to last her [facility]. She states there is a delay in delivery because of the holiday.

## 2022-04-06 ENCOUNTER — Ambulatory Visit: Payer: Medicare Other | Attending: Cardiovascular Disease | Admitting: Cardiovascular Disease

## 2022-04-06 ENCOUNTER — Encounter: Payer: Self-pay | Admitting: Cardiovascular Disease

## 2022-04-06 DIAGNOSIS — E785 Hyperlipidemia, unspecified: Secondary | ICD-10-CM | POA: Insufficient documentation

## 2022-04-06 DIAGNOSIS — I5021 Acute systolic (congestive) heart failure: Secondary | ICD-10-CM | POA: Insufficient documentation

## 2022-04-06 DIAGNOSIS — E782 Mixed hyperlipidemia: Secondary | ICD-10-CM | POA: Diagnosis not present

## 2022-04-06 DIAGNOSIS — I2510 Atherosclerotic heart disease of native coronary artery without angina pectoris: Secondary | ICD-10-CM

## 2022-04-06 DIAGNOSIS — I214 Non-ST elevation (NSTEMI) myocardial infarction: Secondary | ICD-10-CM | POA: Diagnosis not present

## 2022-04-06 DIAGNOSIS — I350 Nonrheumatic aortic (valve) stenosis: Secondary | ICD-10-CM | POA: Insufficient documentation

## 2022-04-06 NOTE — Patient Instructions (Signed)
Medication Instructions:  Your physician recommends that you continue on your current medications as directed. Please refer to the Current Medication list given to you today.  *If you need a refill on your cardiac medications before your next appointment, please call your pharmacy*   Testing/Procedures: Your physician has requested that you have an echocardiogram. Echocardiography is a painless test that uses sound waves to create images of your heart. It provides your doctor with information about the size and shape of your heart and how well your heart's chambers and valves are working. This procedure takes approximately one hour. There are no restrictions for this procedure. To be done in August 2024. This procedure will be done at [address]    Follow-Up: At [facility], you and your health needs are our priority.  As part of our continuing mission to provide you with exceptional heart care, we have created designated Provider Care Teams.  These Care Teams include your primary Cardiologist (physician) and Advanced Practice Providers (APPs -  Physician Assistants and Nurse Practitioners) who all work together to provide you with the care you need, when you need it.  We recommend signing up for the patient portal called "MyChart".  Sign up information is provided on this After Visit Summary.  MyChart is used to connect with patients for Virtual Visits (Telemedicine).  Patients are able to view lab/test results, encounter notes, upcoming appointments, etc.  Non-urgent messages can be sent to your provider as well.   To learn more about what you can do with MyChart, go to [email or web address].    Your next appointment:   6 month(s)  The format for your next appointment:   In Person  Provider:   Fabian Sharp, PA-C, Sande Rives, PA-C, Jory Sims, DNP, ANP, Almyra Deforest, PA-C, or Diona Browner, NP       Then, Quay Burow, MD will plan to see you again in 12  month(s).

## 2022-04-06 NOTE — Assessment & Plan Note (Signed)
History of non-STEMI with troponins that rose to 1294.  She had right and left heart cath performed by Dr. Tamala Julian that revealed nonobstructive disease.

## 2022-04-06 NOTE — Assessment & Plan Note (Signed)
History of hyperlipidemia on statin therapy with lipid profile performed 03/03/2022 revealing total cholesterol 156, LDL 53 and HDL 91.

## 2022-04-06 NOTE — Assessment & Plan Note (Signed)
History of nonischemic cardiomyopathy with an EF initially in the 40 to 45% range.  Her most recent echo performed 03/02/2022 revealed improvement in her EF up to 50 to 55%.  She is asymptomatic.

## 2022-04-06 NOTE — Assessment & Plan Note (Signed)
Recent 2D echo performed 03/02/2022 revealed an EF of 50 to 55% with an aortic valve area of 1.03 cm and a peak gradient of 21 mmHg.  She is asymptomatic from this currently.  We will continue to follow her on an annual basis by 2D echo.

## 2022-04-06 NOTE — Progress Notes (Signed)
04/06/2022 Allene Pyo   03-26-1956  [id number]  Primary Physician Lorrene Reid, PA-C Primary Cardiologist: Lorretta Harp MD [facility], [place], [place]  HPI:  Christie Moreno is a 66 y.o.  thin-appearing single Caucasian female mother of 1 daughter, grandmother of 4 grandchildren who was recently hospitalized in early April with systolic heart failure.  I last saw her in the office 12/19/2021.  She did have a non-STEMI with troponins that rose to 1294.  She has a history of discontinue tobacco abuse.  Right and left heart cath revealed no significant obstructive disease by Dr. Tamala Julian.  Her EF was 40 to 42% with diastolic dysfunction.  She was diuresed and discharged home.  She has seen the advanced heart failure service on outpatient basis.  Her medications have been optimized.  Since I saw her 3 months ago she continues to do well.  She denies chest pain or shortness of breath.  Recent 2D echo performed 03/02/2022 revealed improvement in her LV function up to 50 to 55% with mild to moderate aortic stenosis.  Her valve area was 1.03 cm with a peak gradient of 22 mmHg.   Current Meds  Medication Sig   albuterol (PROVENTIL) (2.5 MG/3ML) 0.083% nebulizer solution Take 3 mLs (2.5 mg total) by nebulization every 6 (six) hours as needed for wheezing or shortness of breath.   aspirin 81 MG EC tablet Take 1 tablet (81 mg total) by mouth daily. Swallow whole.   atorvastatin (LIPITOR) 80 MG tablet Take 1 tablet (80 mg total) by mouth daily at 6 PM.   carvedilol (COREG) 3.125 MG tablet TAKE 1 TABLET TWICE A DAY WITH MEALS   empagliflozin (JARDIANCE) 10 MG TABS tablet Take 1 tablet (10 mg total) by mouth daily.   estrogens, conjugated, (PREMARIN) 1.25 MG tablet Take 1.25 mg by mouth daily.   furosemide (LASIX) 40 MG tablet Take 0.5 tablets (20 mg total) by mouth daily. Keep upcoming appointment for future refills.   hydrOXYzine (ATARAX/VISTARIL) 25 MG tablet Take 0.5 tablets (12.5 mg total)  by mouth every 8 (eight) hours as needed for itching.   ipratropium-albuterol (DUONEB) 0.5-2.5 (3) MG/3ML SOLN Take 3 mLs by nebulization every 6 (six) hours as needed.   lamoTRIgine (LAMICTAL) 100 MG tablet Take 1 tablet (100 mg total) by mouth 2 (two) times daily.   losartan (COZAAR) 50 MG tablet Take 0.5 tablets (25 mg total) by mouth daily.   potassium chloride SA (KLOR-CON M) 20 MEQ tablet Take 1 tablet (20 mEq total) by mouth daily.   progesterone (PROMETRIUM) 200 MG capsule Take 200 mg by mouth at bedtime.     Allergies  Allergen Reactions   Azithromycin Nausea And Vomiting   Erythromycin     Sensitivity (GI)   Percocet [Oxycodone-Acetaminophen]    Penicillins Itching and Rash    Social History   Socioeconomic History   Marital status: Significant Other    Spouse name: Janace Hoard   Number of children: 1   Years of education: Not on file   Highest education level: Bachelor's degree (e.g., BA, AB, BS)  Occupational History   Occupation: retired  Tobacco Use   Smoking status: Some Days    Packs/day: 1.00    Years: 52.00    Total pack years: 52.00    Types: Cigarettes    Last attempt to quit: 11/09/2021    Years since quitting: 0.4   Smokeless tobacco: Never   Tobacco comments:    Currently 1  pack every 2-3 days    04/06/2022 patient states when she smokes its 1 cigarette  Vaping Use   Vaping Use: Never used  Substance and Sexual Activity   Alcohol use: Yes    Comment: socially   Drug use: No   Sexual activity: Not on file  Other Topics Concern   Not on file  Social History Narrative   Not on file   Social Determinants of Health   Financial Resource Strain: Medium Risk (02/22/2022)   Overall Financial Resource Strain (CARDIA)    Difficulty of Paying Living Expenses: Somewhat hard  Food Insecurity: No Food Insecurity (02/22/2022)   Hunger Vital Sign    Worried About Running Out of Food in the Last Year: Never true    Ran Out of Food in the Last Year: Never  true  Transportation Needs: No Transportation Needs (02/22/2022)   PRAPARE - Hydrologist (Medical): No    Lack of Transportation (Non-Medical): No  Physical Activity: Inactive (02/22/2022)   Exercise Vital Sign    Days of Exercise per Week: 0 days    Minutes of Exercise per Session: 0 min  Stress: Stress Concern Present (02/22/2022)   Brethren    Feeling of Stress : To some extent  Social Connections: Socially Isolated (02/22/2022)   Social Connection and Isolation Panel [NHANES]    Frequency of Communication with Friends and Family: Three times a week    Frequency of Social Gatherings with Friends and Family: Twice a week    Attends Religious Services: Never    Marine scientist or Organizations: No    Attends Archivist Meetings: Never    Marital Status: Widowed  Intimate Partner Violence: Not At Risk (02/22/2022)   Humiliation, Afraid, Rape, and Kick questionnaire    Fear of Current or Ex-Partner: No    Emotionally Abused: No    Physically Abused: No    Sexually Abused: No     Review of Systems: General: negative for chills, fever, night sweats or weight changes.  Cardiovascular: negative for chest pain, dyspnea on exertion, edema, orthopnea, palpitations, paroxysmal nocturnal dyspnea or shortness of breath Dermatological: negative for rash Respiratory: negative for cough or wheezing Urologic: negative for hematuria Abdominal: negative for nausea, vomiting, diarrhea, bright red blood per rectum, melena, or hematemesis Neurologic: negative for visual changes, syncope, or dizziness All other systems reviewed and are otherwise negative except as noted above.    Blood pressure (!) 116/54, pulse (!) 58, height '5\' 2"'$  (1.575 m), weight 92 lb 9.6 oz (42 kg), SpO2 92 %.  General appearance: alert and no distress Neck: no adenopathy, no JVD, supple, symmetrical, trachea  midline, thyroid not enlarged, symmetric, no tenderness/mass/nodules, and bilateral carotid bruits versus transmitted murmur Lungs: clear to auscultation bilaterally Heart: Soft outflow tract murmur consistent with aortic stenosis Extremities: extremities normal, atraumatic, no cyanosis or edema Pulses: 2+ and symmetric Skin: Skin color, texture, turgor normal. No rashes or lesions Neurologic: Grossly normal  EKG sinus bradycardia 58 with a rightward axis and low limb voltage.  I personally reviewed this EKG.  ASSESSMENT AND PLAN:   Tobacco dependence For all intents and purposes she has stopped although she does "sneak a cigarette" every once in a while  NSTEMI (non-ST elevated myocardial infarction) ([facility]) History of non-STEMI with troponins that rose to 1294.  She had right and left heart cath performed by Dr. Tamala Julian that revealed nonobstructive disease.  Acute systolic CHF (congestive heart failure) ([facility]) History of nonischemic cardiomyopathy with an EF initially in the 40 to 45% range.  Her most recent echo performed 03/02/2022 revealed improvement in her EF up to 50 to 55%.  She is asymptomatic.  Aortic stenosis Recent 2D echo performed 03/02/2022 revealed an EF of 50 to 55% with an aortic valve area of 1.03 cm and a peak gradient of 21 mmHg.  She is asymptomatic from this currently.  We will continue to follow her on an annual basis by 2D echo.  Hyperlipidemia History of hyperlipidemia on statin therapy with lipid profile performed 03/03/2022 revealing total cholesterol 156, LDL 53 and HDL 91.     Lorretta Harp MD [facility] 04/06/2022 11:50 AM

## 2022-04-06 NOTE — Assessment & Plan Note (Signed)
For all intents and purposes she has stopped although she does "sneak a cigarette" every once in a while

## 2022-04-16 ENCOUNTER — Other Ambulatory Visit: Payer: Self-pay | Admitting: Cardiovascular Disease

## 2022-04-19 ENCOUNTER — Other Ambulatory Visit: Payer: Self-pay

## 2022-04-19 DIAGNOSIS — Z0000 Encounter for general adult medical examination without abnormal findings: Secondary | ICD-10-CM

## 2022-04-19 DIAGNOSIS — I5022 Chronic systolic (congestive) heart failure: Secondary | ICD-10-CM

## 2022-04-20 ENCOUNTER — Other Ambulatory Visit: Payer: Medicare Other

## 2022-04-20 DIAGNOSIS — I5022 Chronic systolic (congestive) heart failure: Secondary | ICD-10-CM

## 2022-04-20 DIAGNOSIS — Z0000 Encounter for general adult medical examination without abnormal findings: Secondary | ICD-10-CM

## 2022-05-20 ENCOUNTER — Other Ambulatory Visit: Payer: Medicare Other

## 2022-06-10 ENCOUNTER — Other Ambulatory Visit: Payer: Self-pay | Admitting: Cardiovascular Disease

## 2022-06-14 ENCOUNTER — Telehealth: Payer: Self-pay | Admitting: Cardiovascular Disease

## 2022-06-14 NOTE — Telephone Encounter (Signed)
  Pt is calling back to f/u. She is out of medications

## 2022-06-14 NOTE — Telephone Encounter (Signed)
*  STAT* If patient is at the pharmacy, call can be transferred to refill team.   1. Which medications need to be refilled? (please list name of each medication and dose if known)  atorvastatin (LIPITOR) 80 MG tablet   2. Which pharmacy/location (including street and city if local pharmacy) is medication to be sent to? [facility] [place], [place]   3. Do they need a 30 day or 90 day supply? 7 days supply  Patient is out of medication    *STAT* If patient is at the pharmacy, call can be transferred to refill team.   1. Which medications need to be refilled? (please list name of each medication and dose if known)  atorvastatin (LIPITOR) 80 MG tablet   2. Which pharmacy/location (including street and city if local pharmacy) is medication to be sent to?  [facility] [place], [place]    3. Do they need a 30 day or 90 day supply? [place]

## 2022-06-18 ENCOUNTER — Encounter: Payer: Self-pay | Admitting: Internal Medicine

## 2022-06-19 LAB — CBC WITH DIFFERENTIAL/PLATELET
Basophils Absolute: 0.1 10*3/uL (ref 0.0–0.2)
Basos: 1 %
EOS (ABSOLUTE): 0.4 10*3/uL (ref 0.0–0.4)
Eos: 5 %
Hematocrit: 39.8 % (ref 34.0–46.6)
Hemoglobin: 12.9 g/dL (ref 11.1–15.9)
Immature Grans (Abs): 0 10*3/uL (ref 0.0–0.1)
Immature Granulocytes: 0 %
Lymphocytes Absolute: 2.2 10*3/uL (ref 0.7–3.1)
Lymphs: 26 %
MCH: 28.3 pg (ref 26.6–33.0)
MCHC: 32.4 g/dL (ref 31.5–35.7)
MCV: 87 fL (ref 79–97)
Monocytes Absolute: 0.8 10*3/uL (ref 0.1–0.9)
Monocytes: 9 %
Neutrophils Absolute: 5.2 10*3/uL (ref 1.4–7.0)
Neutrophils: 59 %
Platelets: 184 10*3/uL (ref 150–450)
RBC: 4.56 x10E6/uL (ref 3.77–5.28)
RDW: 13.7 % (ref 11.7–15.4)
WBC: 8.7 10*3/uL (ref 3.4–10.8)

## 2022-06-19 LAB — COMPREHENSIVE METABOLIC PANEL
ALT: 46 IU/L — ABNORMAL HIGH (ref 0–32)
AST: 34 IU/L (ref 0–40)
Albumin/Globulin Ratio: 1.7 (ref 1.2–2.2)
Albumin: 4.2 g/dL (ref 3.9–4.9)
Alkaline Phosphatase: 71 IU/L (ref 44–121)
BUN/Creatinine Ratio: 25 (ref 12–28)
BUN: 26 mg/dL (ref 8–27)
Bilirubin Total: 0.4 mg/dL (ref 0.0–1.2)
CO2: 24 mmol/L (ref 20–29)
Calcium: 9.7 mg/dL (ref 8.7–10.3)
Chloride: 98 mmol/L (ref 96–106)
Creatinine, Ser: 1.03 mg/dL — ABNORMAL HIGH (ref 0.57–1.00)
Globulin, Total: 2.5 g/dL (ref 1.5–4.5)
Glucose: 88 mg/dL (ref 70–99)
Potassium: 4.4 mmol/L (ref 3.5–5.2)
Sodium: 138 mmol/L (ref 134–144)
Total Protein: 6.7 g/dL (ref 6.0–8.5)
eGFR: 59 mL/min/{1.73_m2} — ABNORMAL LOW (ref >59)

## 2022-06-19 LAB — LIPID PANEL
Chol/HDL Ratio: 1.7 ratio (ref 0.0–4.4)
Cholesterol, Total: 167 mg/dL (ref 100–199)
HDL: 98 mg/dL (ref >39)
LDL Chol Calc (NIH): 57 mg/dL (ref 0–99)
Triglycerides: 62 mg/dL (ref 0–149)
VLDL Cholesterol Cal: 12 mg/dL (ref 5–40)

## 2022-06-19 LAB — TSH: TSH: 6.4 u[IU]/mL — ABNORMAL HIGH (ref 0.450–4.500)

## 2022-06-27 NOTE — Progress Notes (Signed)
Established patient visit   Patient: Christie Moreno   DOB: [birth year]   66 y.o. Female  MRN: [id number] Visit Date: 06/28/2022   Chief Complaint  Patient presents with   Follow-up   Subjective    HPI  Follow up  -hypertension  --generally well controlled. -chronic a-fib.  -sees cardiology on routine basis -colon cancer screening.  Labs drawn prior to this visit -thyroid has been subclinically low.  -would like to get flu shot.  Has appointment for GI consult in three days   Lesions on the bottoms of her feet.  -tender lesions  -long and thick toenails on both feet       Medications: Outpatient Medications Prior to Visit  Medication Sig   albuterol (PROVENTIL) (2.5 MG/3ML) 0.083% nebulizer solution Take 3 mLs (2.5 mg total) by nebulization every 6 (six) hours as needed for wheezing or shortness of breath.   aspirin 81 MG EC tablet Take 1 tablet (81 mg total) by mouth daily. Swallow whole.   atorvastatin (LIPITOR) 80 MG tablet TAKE 1 TABLET DAILY AT 6 P.M. (KEEP UPCOMING APPOINTMENT FOR FUTURE REFILLS   carvedilol (COREG) 3.125 MG tablet TAKE 1 TABLET TWICE A DAY WITH MEALS   empagliflozin (JARDIANCE) 10 MG TABS tablet Take 1 tablet (10 mg total) by mouth daily.   estrogens, conjugated, (PREMARIN) 1.25 MG tablet Take 1.25 mg by mouth daily.   feeding supplement (ENSURE ENLIVE / ENSURE PLUS) LIQD Take 237 mLs by mouth 2 (two) times daily between meals. (Patient not taking: Reported on 07/01/2022)   furosemide (LASIX) 40 MG tablet TAKE ONE-HALF (1/2) TABLET (20 MG TOTAL) DAILY (KEEP UPCOMING APPOINTMENT FOR FUTURE REFILLS)   hydrOXYzine (ATARAX/VISTARIL) 25 MG tablet Take 0.5 tablets (12.5 mg total) by mouth every 8 (eight) hours as needed for itching.   ipratropium-albuterol (DUONEB) 0.5-2.5 (3) MG/3ML SOLN Take 3 mLs by nebulization every 6 (six) hours as needed.   lamoTRIgine (LAMICTAL) 100 MG tablet Take 1 tablet (100 mg total) by mouth 2 (two) times daily.   losartan  (COZAAR) 50 MG tablet Take 0.5 tablets (25 mg total) by mouth daily.   potassium chloride SA (KLOR-CON M) 20 MEQ tablet TAKE 1 TABLET DAILY (KEEP UPCOMING APPOINTMENT FOR FUTURE REFILLS)   progesterone (PROMETRIUM) 200 MG capsule Take 200 mg by mouth at bedtime.   [DISCONTINUED] progesterone (PROMETRIUM) 200 MG capsule Take 200 mg by mouth at bedtime.   No facility-administered medications prior to visit.    Review of Systems  Constitutional:  Negative for activity change, appetite change, chills, fatigue and fever.  HENT:  Negative for congestion, postnasal drip, rhinorrhea, sinus pressure, sinus pain, sneezing and sore throat.   Eyes: Negative.   Respiratory:  Negative for cough, chest tightness, shortness of breath and wheezing.   Cardiovascular:  Negative for chest pain and palpitations.  Gastrointestinal:  Negative for abdominal pain, constipation, diarrhea, nausea and vomiting.  Endocrine: Negative for cold intolerance, heat intolerance, polydipsia and polyuria.  Genitourinary:  Negative for dyspareunia, dysuria, flank pain, frequency and urgency.  Musculoskeletal:  Negative for arthralgias, back pain and myalgias.  Skin:  Negative for rash.       Skin lesions on the bottoms of the feet. Similar to warts. Tender.   Allergic/Immunologic: Negative for environmental allergies.  Neurological:  Negative for dizziness, weakness and headaches.  Hematological:  Negative for adenopathy.  Psychiatric/Behavioral:  The patient is not nervous/anxious.     Last CBC Lab Results  Component Value Date   WBC 8.7  06/18/2022   HGB 12.9 06/18/2022   HCT 39.8 06/18/2022   MCV 87 06/18/2022   MCH 28.3 06/18/2022   RDW 13.7 06/18/2022   PLT 184 03/00/9233   Last metabolic panel Lab Results  Component Value Date   GLUCOSE 88 06/18/2022   NA 138 06/18/2022   K 4.4 06/18/2022   CL 98 06/18/2022   CO2 24 06/18/2022   BUN 26 06/18/2022   CREATININE 1.03 (H) 06/18/2022   EGFR 59 (L) 06/18/2022    CALCIUM 9.7 06/18/2022   PROT 6.7 06/18/2022   ALBUMIN 4.2 06/18/2022   LABGLOB 2.5 06/18/2022   AGRATIO 1.7 06/18/2022   BILITOT 0.4 06/18/2022   ALKPHOS 71 06/18/2022   AST 34 06/18/2022   ALT 46 (H) 06/18/2022   ANIONGAP 7 11/25/2021   Last lipids Lab Results  Component Value Date   CHOL 167 06/18/2022   HDL 98 06/18/2022   LDLCALC 57 06/18/2022   TRIG 62 06/18/2022   CHOLHDL 1.7 06/18/2022   Last thyroid functions Lab Results  Component Value Date   TSH 6.400 (H) 06/18/2022       Objective     Today's Vitals   06/28/22 1524  BP: (Abnormal) 140/80  Pulse: 71  Resp: 18  Weight: 90 lb (40.8 kg)  Height: 5' 1.81" (1.57 m)   Body mass index is 16.56 kg/m.  BP Readings from Last 3 Encounters:  07/01/22 (Abnormal) 144/84  06/28/22 (Abnormal) 140/80  04/06/22 (Abnormal) 116/54    Wt Readings from Last 3 Encounters:  07/01/22 89 lb (40.4 kg)  06/28/22 90 lb (40.8 kg)  04/06/22 92 lb 9.6 oz (42 kg)    Physical Exam Vitals and nursing note reviewed.  Constitutional:      Appearance: Normal appearance. She is well-developed and underweight.  HENT:     Head: Normocephalic.     Nose: Nose normal.     Mouth/Throat:     Mouth: Mucous membranes are moist.     Pharynx: Oropharynx is clear.  Eyes:     Extraocular Movements: Extraocular movements intact.     Conjunctiva/sclera: Conjunctivae normal.     Pupils: Pupils are equal, round, and reactive to light.  Neck:     Vascular: No carotid bruit.  Cardiovascular:     Rate and Rhythm: Normal rate. Rhythm irregular.     Pulses: Normal pulses.     Heart sounds: Normal heart sounds.  Pulmonary:     Effort: Pulmonary effort is normal.     Breath sounds: Normal breath sounds.  Abdominal:     Palpations: Abdomen is soft.  Musculoskeletal:        General: Normal range of motion.     Cervical back: Normal range of motion and neck supple.  Lymphadenopathy:     Cervical: No cervical adenopathy.  Skin:     General: Skin is warm and dry.     Capillary Refill: Capillary refill takes less than 2 seconds.  Neurological:     General: No focal deficit present.     Mental Status: She is alert and oriented to person, place, and time.  Psychiatric:        Mood and Affect: Mood normal.        Behavior: Behavior normal. Behavior is cooperative.        Thought Content: Thought content normal.        Judgment: Judgment normal.      Assessment & Plan    1. Subclinical hypothyroidism Reviewed labs showing  subclinical hypothyroid. Start levothyroxine 25 mcg daily. Recheck thyroid panel in 2 months and adjust dosing as indicated  - levothyroxine (SYNTHROID) 25 MCG tablet; Take 1 tablet (25 mcg total) by mouth daily. (Patient not taking: Reported on 07/01/2022)  Dispense: 30 tablet; Refill: 1  2. Plantar warts Refer to podiatry for further evaluation.  - Ambulatory referral to Podiatry  3. Toenail fungus Refer to podiatry for further evaluation.  - Ambulatory referral to Podiatry  4. Estrogen deficiency Continue hormone replacement therapy and regular visits with GYN   5. Coronary artery disease involving native coronary artery of native heart without angina pectoris Continue regular visits with cardiology as scheduled.   6. Need for influenza vaccination Flu vaccine administered during today's visit.  - Flu Vaccine QUAD High Dose(Fluad)    Problem List Items Addressed This Visit       Cardiovascular and Mediastinum   Coronary artery disease involving native coronary artery of native heart without angina pectoris     Endocrine   Subclinical hypothyroidism - Primary   Relevant Medications   levothyroxine (SYNTHROID) 25 MCG tablet     Musculoskeletal and Integument   Plantar warts   Relevant Orders   Ambulatory referral to Podiatry   Toenail fungus   Relevant Orders   Ambulatory referral to Podiatry     Other   Estrogen deficiency   Other Visit Diagnoses     Need for influenza  vaccination       Relevant Orders   Flu Vaccine QUAD High Dose(Fluad) (Completed)        Return in about 2 months (around 08/28/2022) for hypothyroid - check TSH, Free T4, and bmp prior to visit .         Ronnell Freshwater, NP  [facility] at [facility] [place] [phone] (phone) [phone] (fax)  [place]

## 2022-06-28 ENCOUNTER — Encounter: Payer: Self-pay | Admitting: Nurse Practitioner

## 2022-06-28 ENCOUNTER — Ambulatory Visit: Payer: Medicare Other | Admitting: Nurse Practitioner

## 2022-06-28 ENCOUNTER — Ambulatory Visit: Payer: Medicare Other | Admitting: Physician Assistant

## 2022-06-28 VITALS — BP 140/80 | HR 71 | Resp 18 | Ht 61.81 in | Wt 90.0 lb

## 2022-06-28 DIAGNOSIS — E038 Other specified hypothyroidism: Secondary | ICD-10-CM

## 2022-06-28 DIAGNOSIS — E2839 Other primary ovarian failure: Secondary | ICD-10-CM

## 2022-06-28 DIAGNOSIS — I2510 Atherosclerotic heart disease of native coronary artery without angina pectoris: Secondary | ICD-10-CM

## 2022-06-28 DIAGNOSIS — Z23 Encounter for immunization: Secondary | ICD-10-CM

## 2022-06-28 DIAGNOSIS — B351 Tinea unguium: Secondary | ICD-10-CM

## 2022-06-28 DIAGNOSIS — B070 Plantar wart: Secondary | ICD-10-CM

## 2022-06-28 MED ORDER — LEVOTHYROXINE SODIUM 25 MCG PO TABS: 25.0000 ug | ORAL_TABLET | Freq: Every day | ORAL | 1 refills | Status: DC

## 2022-07-01 ENCOUNTER — Ambulatory Visit (INDEPENDENT_AMBULATORY_CARE_PROVIDER_SITE_OTHER): Payer: Medicare Other | Admitting: Internal Medicine

## 2022-07-01 ENCOUNTER — Encounter: Payer: Self-pay | Admitting: Internal Medicine

## 2022-07-01 VITALS — BP 144/84 | HR 78 | Ht 62.0 in | Wt 89.0 lb

## 2022-07-01 DIAGNOSIS — Z1211 Encounter for screening for malignant neoplasm of colon: Secondary | ICD-10-CM

## 2022-07-01 NOTE — Progress Notes (Signed)
Chief Complaint: Colon cancer screening  HPI : 66 year old female with history of seizures, CAD, and tobacco use presents to discuss colon cancer screening  Her last colonoscopy was several years ago. She is not sure exactly when it was done. She knows that is was done in [place] but states that the practice has now moved, and she does not recall the name of the practice. Reportedly her last colonoscopy was normal. Denies blood in stools, changes in bowel habits, diarrhea, constipation, and unintentional weight loss. She has one BM on average per day.  Denies dysphagia or N&V. Denies chest burning or regurgitation. She has ab pain that can happen on occasion, which is chronic in nature and she has been told that it is not due to a gynecological issue. Denies family history of colon cancer. Last seizure was a few years ago. Denies prior EGD  Wt Readings from Last 3 Encounters:  07/01/22 89 lb (40.4 kg)  06/28/22 90 lb (40.8 kg)  04/06/22 92 lb 9.6 oz (42 kg)   Past Medical History:  Diagnosis Date   Heart failure ([facility])    Seizures ([facility])    none in years, recently had medication change   Past Surgical History:  Procedure Laterality Date   RIGHT/LEFT HEART CATH AND CORONARY ANGIOGRAPHY N/A 11/05/2021   Procedure: RIGHT/LEFT HEART CATH AND CORONARY ANGIOGRAPHY;  Surgeon: Belva Crome, MD;  Location: [facility];  Service: Cardiovascular;  Laterality: N/A;   Family History  Adopted: Yes  Problem Relation Age of Onset   Breast cancer Mother    Diabetes Mother    Cancer Mother        breast   Stroke Mother    Heart Problems Father    Diabetes Sister    Breast cancer Maternal Aunt    Colon cancer Neg Hx    Esophageal cancer Neg Hx    Social History   Tobacco Use   Smoking status: Some Days    Packs/day: 1.00    Years: 52.00    Total pack years: 52.00    Types: Cigarettes    Last attempt to quit: 11/09/2021    Years since quitting: 0.6   Smokeless tobacco: Never    Tobacco comments:    Currently 1 pack every 2-3 days    04/06/2022 patient states when she smokes its 1 cigarette  Vaping Use   Vaping Use: Never used  Substance Use Topics   Alcohol use: Yes    Comment: socially   Drug use: No   Current Outpatient Medications  Medication Sig Dispense Refill   albuterol (PROVENTIL) (2.5 MG/3ML) 0.083% nebulizer solution Take 3 mLs (2.5 mg total) by nebulization every 6 (six) hours as needed for wheezing or shortness of breath. 90 mL 12   aspirin 81 MG EC tablet Take 1 tablet (81 mg total) by mouth daily. Swallow whole. 30 tablet 0   atorvastatin (LIPITOR) 80 MG tablet TAKE 1 TABLET DAILY AT 6 P.M. (KEEP UPCOMING APPOINTMENT FOR FUTURE REFILLS 30 tablet 11   carvedilol (COREG) 3.125 MG tablet TAKE 1 TABLET TWICE A DAY WITH MEALS 60 tablet 11   empagliflozin (JARDIANCE) 10 MG TABS tablet Take 1 tablet (10 mg total) by mouth daily. 90 tablet 3   estrogens, conjugated, (PREMARIN) 1.25 MG tablet Take 1.25 mg by mouth daily.     furosemide (LASIX) 40 MG tablet TAKE ONE-HALF (1/2) TABLET (20 MG TOTAL) DAILY (KEEP UPCOMING APPOINTMENT FOR FUTURE REFILLS) 30 tablet 5  hydrOXYzine (ATARAX/VISTARIL) 25 MG tablet Take 0.5 tablets (12.5 mg total) by mouth every 8 (eight) hours as needed for itching. 30 tablet 0   ipratropium-albuterol (DUONEB) 0.5-2.5 (3) MG/3ML SOLN Take 3 mLs by nebulization every 6 (six) hours as needed. 360 mL 0   lamoTRIgine (LAMICTAL) 100 MG tablet Take 1 tablet (100 mg total) by mouth 2 (two) times daily. 180 tablet 1   losartan (COZAAR) 50 MG tablet Take 0.5 tablets (25 mg total) by mouth daily. 90 tablet 3   potassium chloride SA (KLOR-CON M) 20 MEQ tablet TAKE 1 TABLET DAILY (KEEP UPCOMING APPOINTMENT FOR FUTURE REFILLS) 30 tablet 11   progesterone (PROMETRIUM) 200 MG capsule Take 200 mg by mouth at bedtime.     feeding supplement (ENSURE ENLIVE / ENSURE PLUS) LIQD Take 237 mLs by mouth 2 (two) times daily between meals. (Patient not taking:  Reported on 07/01/2022) 237 mL 0   levothyroxine (SYNTHROID) 25 MCG tablet Take 1 tablet (25 mcg total) by mouth daily. (Patient not taking: Reported on 07/01/2022) 30 tablet 1   No current facility-administered medications for this visit.   Allergies  Allergen Reactions   Azithromycin Nausea And Vomiting   Erythromycin     Sensitivity (GI)   Percocet [Oxycodone-Acetaminophen]    Penicillins Itching and Rash   Review of Systems: All systems reviewed and negative except where noted in HPI.   Physical Exam: BP (!) 144/84   Pulse 78   Ht '5\' 2"'$  (1.575 m)   Wt 89 lb (40.4 kg)   BMI 16.28 kg/m  Constitutional: Pleasant,well-developed, female in no acute distress. HEENT: Normocephalic and atraumatic. Conjunctivae are normal. No scleral icterus. Cardiovascular: Normal rate, regular rhythm.  Pulmonary/chest: Effort normal and breath sounds normal. No wheezing, rales or rhonchi. Abdominal: Soft, nondistended, nontender. Bowel sounds active throughout. There are no masses palpable. No hepatomegaly. Extremities: No edema Neurological: Alert and oriented to person place and time. Skin: Skin is warm and dry. No rashes noted. Psychiatric: Normal mood and affect. Behavior is normal.  Labs 06/2022: CBC nml. CMP with mildly elevated Cr of 1.03 and mildly elevated ALT of 46. TSH elevated at 6.4.   CT A/P w/o contrast 11/03/21: IMPRESSION: Small bilateral pleural effusions.  Bibasilar atelectasis. Coronary artery disease.  Aortic atherosclerosis. No definite acute process in the abdomen or pelvis.  RUQ U/S 11/03/21: IMPRESSION: 1. Unremarkable sonographic appearance of the liver. 2. Minimal layering sludge within the gallbladder. No gallstone or evidence of cholecystitis.  TTE 03/02/22: IMPRESSIONS   1. Left ventricular ejection fraction, by estimation, is 50 to 55%. The  left ventricle has low normal function. The left ventricle has no regional  wall motion abnormalities. Left ventricular  diastolic parameters are  indeterminate. Elevated left atrial pressure.   2. Right ventricular systolic function is normal. The right ventricular  size is normal. There is normal pulmonary artery systolic pressure.   3. Left atrial size was mild to moderately dilated.   4. The mitral valve is degenerative. Mild mitral valve regurgitation.  Mild mitral stenosis. The mean mitral valve gradient is 3.0 mmHg.   5. Functionally bicuspid. The left and non-coronary cusps are essentially  fused. The aortic valve is calcified. There is severe calcifcation of the  aortic valve. There is severe thickening of the aortic valve. Aortic valve  regurgitation is mild to  moderate. Mild aortic valve stenosis. Aortic regurgitation PHT measures  414 msec. Aortic valve area, by VTI measures 1.03 cm. Aortic valve mean  gradient measures  12.0 mmHg. Aortic valve Vmax measures 2.34 m/s.   6. The inferior vena cava is normal in size with greater than 50%  respiratory variability, suggesting right atrial pressure of 3 mmHg.   ASSESSMENT AND PLAN: Colon cancer screening Patient presents to discuss a colonoscopy for colon cancer screening. Her last colonoscopy was several years ago but she cannot recall exactly when or where it was performed. Thus I think it is unlikely that we will be able to get formal records of her prior colonoscopy. I went over the colonoscopy procedure in detail with the patient, and she is agreeable to proceeding. - Colonoscopy [facility]  Christia Reading, MD  I spent 45 minutes of time, including in depth chart review, independent review of results as outlined above, communicating results with the patient directly, face-to-face time with the patient, coordinating care, ordering studies and medications as appropriate, and documentation.

## 2022-07-01 NOTE — Patient Instructions (Addendum)
_______________________________________________________  If you are age 68 or older, your body mass index should be between 23-30. Your Body mass index is 16.28 kg/m. If this is out of the aforementioned range listed, please consider follow up with your Primary Care Provider.  If you are age 63 or younger, your body mass index should be between 19-25. Your Body mass index is 16.28 kg/m. If this is out of the aformentioned range listed, please consider follow up with your Primary Care Provider.   ________________________________________________________  The [facility] providers would like to encourage you to use [facility] to communicate with providers for non-urgent requests or questions.  Due to long hold times on the telephone, sending your provider a message by [facility] may be a faster and more efficient way to get a response.  Please allow 48 business hours for a response.  Please remember that this is for non-urgent requests.  _______________________________________________________  Christie Moreno have been scheduled for a colonoscopy. Please follow written instructions given to you at your visit today.  Please pick up your prep supplies at the pharmacy within the next 1-3 days. If you use inhalers (even only as needed), please bring them with you on the day of your procedure.  Due to recent changes in healthcare laws, you may see the results of your imaging and laboratory studies on MyChart before your provider has had a chance to review them.  We understand that in some cases there may be results that are confusing or concerning to you. Not all laboratory results come back in the same time frame and the provider may be waiting for multiple results in order to interpret others.  Please give [place] 48 hours in order for your provider to thoroughly review all the results before contacting the office for clarification of your results.   It was a pleasure to see you today!  Dr Lucilla Lame

## 2022-07-04 DIAGNOSIS — I2510 Atherosclerotic heart disease of native coronary artery without angina pectoris: Secondary | ICD-10-CM | POA: Insufficient documentation

## 2022-07-04 DIAGNOSIS — E039 Hypothyroidism, unspecified: Secondary | ICD-10-CM | POA: Insufficient documentation

## 2022-07-04 DIAGNOSIS — B351 Tinea unguium: Secondary | ICD-10-CM | POA: Insufficient documentation

## 2022-07-04 DIAGNOSIS — B070 Plantar wart: Secondary | ICD-10-CM | POA: Insufficient documentation

## 2022-07-04 DIAGNOSIS — E2839 Other primary ovarian failure: Secondary | ICD-10-CM | POA: Insufficient documentation

## 2022-07-04 DIAGNOSIS — E038 Other specified hypothyroidism: Secondary | ICD-10-CM | POA: Insufficient documentation

## 2022-07-07 ENCOUNTER — Ambulatory Visit (INDEPENDENT_AMBULATORY_CARE_PROVIDER_SITE_OTHER): Payer: Medicare Other | Admitting: Podiatry

## 2022-07-07 DIAGNOSIS — Q828 Other specified congenital malformations of skin: Secondary | ICD-10-CM

## 2022-07-07 DIAGNOSIS — M79674 Pain in right toe(s): Secondary | ICD-10-CM

## 2022-07-07 DIAGNOSIS — M79675 Pain in left toe(s): Secondary | ICD-10-CM

## 2022-07-07 DIAGNOSIS — I2510 Atherosclerotic heart disease of native coronary artery without angina pectoris: Secondary | ICD-10-CM | POA: Diagnosis not present

## 2022-07-07 DIAGNOSIS — I999 Unspecified disorder of circulatory system: Secondary | ICD-10-CM

## 2022-07-07 DIAGNOSIS — B351 Tinea unguium: Secondary | ICD-10-CM | POA: Diagnosis not present

## 2022-07-08 LAB — HM PAP SMEAR: HM Pap smear: NEGATIVE

## 2022-07-08 NOTE — Progress Notes (Signed)
Subjective:   Patient ID: Christie Moreno, female   DOB: 66 y.o.   MRN: [id number]   HPI Patient presents with significantly painful lesions on the heel of both feet and has nail disease with severe thickness and pain 1-5 both feet.  She is not in good health she still smokes cigarettes and has been for 52 years and has had history of vascular issues.  Patient's not active   Review of Systems  All other systems reviewed and are negative.       Objective:  Physical Exam Vitals and nursing note reviewed.  Constitutional:      Appearance: She is well-developed.  Pulmonary:     Effort: Pulmonary effort is normal.  Musculoskeletal:        General: Normal range of motion.  Skin:    General: Skin is warm.  Neurological:     Mental Status: She is alert.     Neurovascular status was found to be reduced as far as pulses I did not note pulses DP PT there were very weak present she has had moderate claudication symptoms redness in her digits but she does have this checked and has not had any worsening of the condition.  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  I then debrided the lesions bilateral they were very close to her tissue there is some bleeding on the right 1 of which I cleaned up currently and applied sterile dressing with Neosporin and instructed her on using bandages and I then debrided nailbeds 1-5 both feet no angiogenic bleeding reappoint as symptoms indicate

## 2022-07-19 ENCOUNTER — Ambulatory Visit (AMBULATORY_SURGERY_CENTER): Payer: Medicare Other | Admitting: Internal Medicine

## 2022-07-19 ENCOUNTER — Encounter: Payer: Self-pay | Admitting: Internal Medicine

## 2022-07-19 VITALS — BP 104/66 | HR 66 | Temp 97.3°F | Resp 14 | Ht 62.0 in | Wt 89.0 lb

## 2022-07-19 DIAGNOSIS — D128 Benign neoplasm of rectum: Secondary | ICD-10-CM

## 2022-07-19 DIAGNOSIS — Z1211 Encounter for screening for malignant neoplasm of colon: Secondary | ICD-10-CM

## 2022-07-19 DIAGNOSIS — K5190 Ulcerative colitis, unspecified, without complications: Secondary | ICD-10-CM | POA: Diagnosis not present

## 2022-07-19 DIAGNOSIS — K5150 Left sided colitis without complications: Secondary | ICD-10-CM | POA: Diagnosis not present

## 2022-07-19 MED ORDER — SODIUM CHLORIDE 0.9 % IV SOLN: 500.0000 mL | Freq: Once | INTRAVENOUS | Status: DC

## 2022-07-19 NOTE — Op Note (Signed)
[facility] Patient Name: Christie Moreno Procedure Date: 07/19/2022 2:15 PM MRN: [id number] Endoscopist: Georgian Co , , [id number] Age: 66 Referring MD:  Date of Birth: 1956/05/03 Gender: Female Account #: [id number] Procedure:                Colonoscopy Indications:              Screening for colorectal malignant neoplasm Medicines:                Monitored Anesthesia Care Procedure:                Pre-Anesthesia Assessment:                           - Prior to the procedure, a History and Physical                            was performed, and patient medications and                            allergies were reviewed. The patient's tolerance of                            previous anesthesia was also reviewed. The risks                            and benefits of the procedure and the sedation                            options and risks were discussed with the patient.                            All questions were answered, and informed consent                            was obtained. Prior Anticoagulants: The patient has                            taken no anticoagulant or antiplatelet agents. ASA                            Grade Assessment: III - A patient with severe                            systemic disease. After reviewing the risks and                            benefits, the patient was deemed in satisfactory                            condition to undergo the procedure.                           After obtaining informed consent, the colonoscope  was passed under direct vision. Throughout the                            procedure, the patient's blood pressure, pulse, and                            oxygen saturations were monitored continuously. The                            CF [id number] #[id number] was introduced through the anus                            and advanced to the the terminal ileum. The                             colonoscopy was performed without difficulty. The                            patient tolerated the procedure well. The quality                            of the bowel preparation was good. The terminal                            ileum, ileocecal valve, appendiceal orifice, and                            rectum were photographed. Scope In: 2:28:46 PM Scope Out: 2:57:46 PM Scope Withdrawal Time: 0 hours 23 minutes 3 seconds  Total Procedure Duration: 0 hours 29 minutes 0 seconds  Findings:                 The terminal ileum appeared normal.                           Localized severe inflammation characterized by                            congestion (edema), erythema, granularity and                            shallow ulcerations was found in the cecum.                            Biopsies were taken with a cold forceps for                            histology.                           A 3 mm polyp was found in the rectum. The polyp was                            sessile. The polyp was removed with a cold snare.  Resection and retrieval were complete.                           Non-bleeding internal hemorrhoids were found during                            retroflexion. Complications:            No immediate complications. Estimated Blood Loss:     Estimated blood loss was minimal. Impression:               - The examined portion of the ileum was normal.                           - Localized severe inflammation was found in the                            cecum. Biopsied.                           - One 3 mm polyp in the rectum, removed with a cold                            snare. Resected and retrieved.                           - Non-bleeding internal hemorrhoids. Recommendation:           - Discharge patient to home (with escort).                           - Await pathology results.                           - The findings and recommendations were discussed                             with the patient. Dr Georgian Co "Lyndee Leo" Lorenso Courier,  07/19/2022 3:03:00 PM

## 2022-07-19 NOTE — Progress Notes (Signed)
Vss nad trans to pacu °

## 2022-07-19 NOTE — Progress Notes (Signed)
VS completed by DT.  Pt's states no medical or surgical changes since previsit or office visit.  

## 2022-07-19 NOTE — Progress Notes (Signed)
GASTROENTEROLOGY PROCEDURE H&P NOTE   Primary Care Physician: Lorrene Reid, PA-C    Reason for Procedure:   Colon cancer screening  Plan:    Colonoscopy  Patient is appropriate for endoscopic procedure(s) in the ambulatory ([facility]) setting.  The nature of the procedure, as well as the risks, benefits, and alternatives were carefully and thoroughly reviewed with the patient. Ample time for discussion and questions allowed. The patient understood, was satisfied, and agreed to proceed.     HPI: Christie Moreno is a 66 y.o. female who presents for colonoscopy for evaluation of colon cancer screening .  Patient was most recently seen in the Gastroenterology Clinic on 07/01/22.  No interval change in medical history since that appointment. Please refer to that note for full details regarding GI history and clinical presentation.   Past Medical History:  Diagnosis Date   Heart failure ([place])    Seizures ([place])    none in years, recently had medication change    Past Surgical History:  Procedure Laterality Date   COLONOSCOPY     RIGHT/LEFT HEART CATH AND CORONARY ANGIOGRAPHY N/A 11/05/2021   Procedure: RIGHT/LEFT HEART CATH AND CORONARY ANGIOGRAPHY;  Surgeon: Belva Crome, MD;  Location: [facility];  Service: Cardiovascular;  Laterality: N/A;    Prior to Admission medications   Medication Sig Start Date End Date Taking? Authorizing Provider  aspirin 81 MG EC tablet Take 1 tablet (81 mg total) by mouth daily. Swallow whole. 11/06/21  Yes Arrien, Jimmy Picket, MD  atorvastatin (LIPITOR) 80 MG tablet TAKE 1 TABLET DAILY AT 6 P.M. (KEEP UPCOMING APPOINTMENT FOR FUTURE REFILLS 04/19/22  Yes Lorretta Harp, MD  carvedilol (COREG) 3.125 MG tablet TAKE 1 TABLET TWICE A DAY WITH MEALS 03/29/22  Yes Lorretta Harp, MD  empagliflozin (JARDIANCE) 10 MG TABS tablet Take 1 tablet (10 mg total) by mouth daily. 12/14/21  Yes Lorretta Harp, MD  estrogens, conjugated, (PREMARIN)  1.25 MG tablet Take 1.25 mg by mouth daily.   Yes [provider]  furosemide (LASIX) 40 MG tablet TAKE ONE-HALF (1/2) TABLET (20 MG TOTAL) DAILY (KEEP UPCOMING APPOINTMENT FOR FUTURE REFILLS) 06/10/22  Yes Lorretta Harp, MD  lamoTRIgine (LAMICTAL) 100 MG tablet Take 1 tablet (100 mg total) by mouth 2 (two) times daily. 03/09/22  Yes Marcial Pacas, MD  losartan (COZAAR) 50 MG tablet Take 0.5 tablets (25 mg total) by mouth daily. 12/23/21  Yes Lorretta Harp, MD  potassium chloride SA (KLOR-CON M) 20 MEQ tablet TAKE 1 TABLET DAILY (KEEP UPCOMING APPOINTMENT FOR FUTURE REFILLS) 04/19/22  Yes Lorretta Harp, MD  progesterone (PROMETRIUM) 200 MG capsule Take 200 mg by mouth at bedtime. 04/20/22  Yes [provider]  albuterol (PROVENTIL) (2.5 MG/3ML) 0.083% nebulizer solution Take 3 mLs (2.5 mg total) by nebulization every 6 (six) hours as needed for wheezing or shortness of breath. 11/06/21   Arrien, Jimmy Picket, MD  feeding supplement (ENSURE ENLIVE / ENSURE PLUS) LIQD Take 237 mLs by mouth 2 (two) times daily between meals. Patient not taking: Reported on 07/01/2022 11/06/21   Arrien, Jimmy Picket, MD  hydrOXYzine (ATARAX/VISTARIL) 25 MG tablet Take 0.5 tablets (12.5 mg total) by mouth every 8 (eight) hours as needed for itching. 02/11/21   Jaynee Eagles, PA-C  ipratropium-albuterol (DUONEB) 0.5-2.5 (3) MG/3ML SOLN Take 3 mLs by nebulization every 6 (six) hours as needed. 11/06/21   Arrien, Jimmy Picket, MD  levothyroxine (SYNTHROID) 25 MCG tablet Take 1 tablet (  25 mcg total) by mouth daily. Patient not taking: Reported on 07/01/2022 06/28/22   Ronnell Freshwater, NP    Current Outpatient Medications  Medication Sig Dispense Refill   aspirin 81 MG EC tablet Take 1 tablet (81 mg total) by mouth daily. Swallow whole. 30 tablet 0   atorvastatin (LIPITOR) 80 MG tablet TAKE 1 TABLET DAILY AT 6 P.M. (KEEP UPCOMING APPOINTMENT FOR FUTURE REFILLS 30 tablet 11   carvedilol (COREG) 3.125  MG tablet TAKE 1 TABLET TWICE A DAY WITH MEALS 60 tablet 11   empagliflozin (JARDIANCE) 10 MG TABS tablet Take 1 tablet (10 mg total) by mouth daily. 90 tablet 3   estrogens, conjugated, (PREMARIN) 1.25 MG tablet Take 1.25 mg by mouth daily.     furosemide (LASIX) 40 MG tablet TAKE ONE-HALF (1/2) TABLET (20 MG TOTAL) DAILY (KEEP UPCOMING APPOINTMENT FOR FUTURE REFILLS) 30 tablet 5   lamoTRIgine (LAMICTAL) 100 MG tablet Take 1 tablet (100 mg total) by mouth 2 (two) times daily. 180 tablet 1   losartan (COZAAR) 50 MG tablet Take 0.5 tablets (25 mg total) by mouth daily. 90 tablet 3   potassium chloride SA (KLOR-CON M) 20 MEQ tablet TAKE 1 TABLET DAILY (KEEP UPCOMING APPOINTMENT FOR FUTURE REFILLS) 30 tablet 11   progesterone (PROMETRIUM) 200 MG capsule Take 200 mg by mouth at bedtime.     albuterol (PROVENTIL) (2.5 MG/3ML) 0.083% nebulizer solution Take 3 mLs (2.5 mg total) by nebulization every 6 (six) hours as needed for wheezing or shortness of breath. 90 mL 12   feeding supplement (ENSURE ENLIVE / ENSURE PLUS) LIQD Take 237 mLs by mouth 2 (two) times daily between meals. (Patient not taking: Reported on 07/01/2022) 237 mL 0   hydrOXYzine (ATARAX/VISTARIL) 25 MG tablet Take 0.5 tablets (12.5 mg total) by mouth every 8 (eight) hours as needed for itching. 30 tablet 0   ipratropium-albuterol (DUONEB) 0.5-2.5 (3) MG/3ML SOLN Take 3 mLs by nebulization every 6 (six) hours as needed. 360 mL 0   levothyroxine (SYNTHROID) 25 MCG tablet Take 1 tablet (25 mcg total) by mouth daily. (Patient not taking: Reported on 07/01/2022) 30 tablet 1   Current Facility-Administered Medications  Medication Dose Route Frequency Provider Last Rate Last Admin   0.9 %  sodium chloride infusion  500 mL Intravenous Once Sharyn Creamer, MD        Allergies as of 07/19/2022 - Review Complete 07/19/2022  Allergen Reaction Noted   Azithromycin Nausea And Vomiting 04/19/2014   Erythromycin  06/14/2012   Percocet  [oxycodone-acetaminophen]  06/14/2012   Penicillins Itching and Rash 06/14/2012    Family History  Adopted: Yes  Problem Relation Age of Onset   Breast cancer Mother    Diabetes Mother    Cancer Mother        breast   Stroke Mother    Heart Problems Father    Diabetes Sister    Breast cancer Maternal Aunt    Colon cancer Neg Hx    Esophageal cancer Neg Hx     Social History   Socioeconomic History   Marital status: Significant Other    Spouse name: Janace Hoard   Number of children: 1   Years of education: Not on file   Highest education level: Bachelor's degree (e.g., BA, AB, BS)  Occupational History   Occupation: retired  Tobacco Use   Smoking status: Some Days    Packs/day: 1.00    Years: 52.00    Total pack years: 52.00  Types: Cigarettes    Last attempt to quit: 11/09/2021    Years since quitting: 0.6   Smokeless tobacco: Never   Tobacco comments:    Currently 1 pack every 2-3 days    04/06/2022 patient states when she smokes its 1 cigarette  Vaping Use   Vaping Use: Never used  Substance and Sexual Activity   Alcohol use: Yes    Comment: socially   Drug use: No   Sexual activity: Not on file  Other Topics Concern   Not on file  Social History Narrative   Not on file   Social Determinants of Health   Financial Resource Strain: Medium Risk (02/22/2022)   Overall Financial Resource Strain (CARDIA)    Difficulty of Paying Living Expenses: Somewhat hard  Food Insecurity: No Food Insecurity (02/22/2022)   Hunger Vital Sign    Worried About Running Out of Food in the Last Year: Never true    Ran Out of Food in the Last Year: Never true  Transportation Needs: No Transportation Needs (02/22/2022)   PRAPARE - Hydrologist (Medical): No    Lack of Transportation (Non-Medical): No  Physical Activity: Inactive (02/22/2022)   Exercise Vital Sign    Days of Exercise per Week: 0 days    Minutes of Exercise per Session: 0 min  Stress:  Stress Concern Present (02/22/2022)   [place]    Feeling of Stress : To some extent  Social Connections: Socially Isolated (02/22/2022)   Social Connection and Isolation Panel [NHANES]    Frequency of Communication with Friends and Family: Three times a week    Frequency of Social Gatherings with Friends and Family: Twice a week    Attends Religious Services: Never    Marine scientist or Organizations: No    Attends Archivist Meetings: Never    Marital Status: Widowed  Intimate Partner Violence: Not At Risk (02/22/2022)   Humiliation, Afraid, Rape, and Kick questionnaire    Fear of Current or Ex-Partner: No    Emotionally Abused: No    Physically Abused: No    Sexually Abused: No    Physical Exam: Vital signs in last 24 hours: BP (!) 143/70   Temp (!) 97.3 F (36.3 C) (Temporal)   Ht '5\' 2"'$  (1.575 m)   Wt 89 lb (40.4 kg)   BMI 16.28 kg/m  GEN: NAD EYE: Sclerae anicteric ENT: MMM CV: Non-tachycardic Pulm: No increased WOB GI: Soft NEURO:  Alert & Oriented   Christia Reading, MD [facility]   07/19/2022 2:14 PM

## 2022-07-19 NOTE — Progress Notes (Signed)
Called to room to assist during endoscopic procedure.  Patient ID and intended procedure confirmed with present staff. Received instructions for my participation in the procedure from the performing physician.  

## 2022-07-19 NOTE — Patient Instructions (Signed)
-   Discharge patient to home (with escort). - Await pathology results. - The findings and recommendations were discussed with the patient. -Handout on polyps and hemorrhoids provided   YOU HAD AN ENDOSCOPIC PROCEDURE TODAY AT [place]:   Refer to the procedure report that was given to you for any specific questions about what was found during the examination.  If the procedure report does not answer your questions, please call your gastroenterologist to clarify.  If you requested that your care partner not be given the details of your procedure findings, then the procedure report has been included in a sealed envelope for you to review at your convenience later.  YOU SHOULD EXPECT: Some feelings of bloating in the abdomen. Passage of more gas than usual.  Walking can help get rid of the air that was put into your GI tract during the procedure and reduce the bloating. If you had a lower endoscopy (such as a colonoscopy or flexible sigmoidoscopy) you may notice spotting of blood in your stool or on the toilet paper. If you underwent a bowel prep for your procedure, you may not have a normal bowel movement for a few days.  Please Note:  You might notice some irritation and congestion in your nose or some drainage.  This is from the oxygen used during your procedure.  There is no need for concern and it should clear up in a day or so.  SYMPTOMS TO REPORT IMMEDIATELY:  Following lower endoscopy (colonoscopy or flexible sigmoidoscopy):  Excessive amounts of blood in the stool  Significant tenderness or worsening of abdominal pains  Swelling of the abdomen that is new, acute  Fever of 100F or higher   For urgent or emergent issues, a gastroenterologist can be reached at any hour by calling [phone]. Do not use MyChart messaging for urgent concerns.    DIET:  We do recommend a small meal at first, but then you may proceed to your regular diet.  Drink plenty of fluids but you  should avoid alcoholic beverages for 24 hours.  ACTIVITY:  You should plan to take it easy for the rest of today and you should NOT DRIVE or use heavy machinery until tomorrow (because of the sedation medicines used during the test).    FOLLOW UP: Our staff will call the number listed on your records the next business day following your procedure.  We will call around 7:15- 8:00 am to check on you and address any questions or concerns that you may have regarding the information given to you following your procedure. If we do not reach you, we will leave a message.     If any biopsies were taken you will be contacted by phone or by letter within the next 1-3 weeks.  Please call us at [phone] if you have not heard about the biopsies in 3 weeks.    SIGNATURES/CONFIDENTIALITY: You and/or your care partner have signed paperwork which will be entered into your electronic medical record.  These signatures attest to the fact that that the information above on your After Visit Summary has been reviewed and is understood.  Full responsibility of the confidentiality of this discharge information lies with you and/or your care-partner.

## 2022-07-20 ENCOUNTER — Telehealth: Payer: Self-pay

## 2022-07-20 NOTE — Telephone Encounter (Signed)
  Follow up Call-     07/19/2022    1:45 PM  Call back number  Post procedure Call Back phone  # [phone]  Permission to leave phone message Yes     Patient questions:  Do you have a fever, pain , or abdominal swelling? No. Pain Score  0 *  Have you tolerated food without any problems? Yes.    Have you been able to return to your normal activities? Yes.    Do you have any questions about your discharge instructions: Diet   No. Medications  No. Follow up visit  No.  Do you have questions or concerns about your Care? No.  Actions: * If pain score is 4 or above: No action needed, pain <4.

## 2022-07-22 ENCOUNTER — Encounter: Payer: Self-pay | Admitting: Internal Medicine

## 2022-08-11 ENCOUNTER — Ambulatory Visit (INDEPENDENT_AMBULATORY_CARE_PROVIDER_SITE_OTHER): Payer: Medicare Other | Admitting: Internal Medicine

## 2022-08-11 ENCOUNTER — Encounter: Payer: Self-pay | Admitting: Internal Medicine

## 2022-08-11 VITALS — BP 118/70 | HR 60 | Ht 61.5 in | Wt 90.6 lb

## 2022-08-11 DIAGNOSIS — K529 Noninfective gastroenteritis and colitis, unspecified: Secondary | ICD-10-CM

## 2022-08-11 NOTE — Patient Instructions (Signed)
_______________________________________________________  If you are age 69 or older, your body mass index should be between 23-30. Your Body mass index is 16.84 kg/m. If this is out of the aforementioned range listed, please consider follow up with your Primary Care Provider.  If you are age 50 or younger, your body mass index should be between 19-25. Your Body mass index is 16.84 kg/m. If this is out of the aformentioned range listed, please consider follow up with your Primary Care Provider.   ________________________________________________________  The [facility] providers would like to encourage you to use [facility] to communicate with providers for non-urgent requests or questions.  Due to long hold times on the telephone, sending your provider a message by [facility] may be a faster and more efficient way to get a response.  Please allow 48 business hours for a response.  Please remember that this is for non-urgent requests.  _______________________________________________________  Please aim for 8 cups a water per day  Follow up as needed  Call with any questions or concerns.  Thank you for entrusting me with your care and for choosing [facility], Dr. Christia Reading

## 2022-08-11 NOTE — Progress Notes (Signed)
Chief Complaint: Colitis  HPI : 67 year old female with history of seizures, CAD, and tobacco use presents for follow up of colitis  Interval History: Sometimes she will have left pain that happens on occasion for the last couple of years. She calls this ab pain her phantom pain because it is unclear what causes the pain to occur. Denies diarrhea. Denies hematochezia or melena currently. She had a little bit of rectal bleeding after her colonoscopy but this resolved on its own. She used to take Aleve for headaches over a year ago. Denies any recent syncope or presyncope. She does think that she may not be drinking enough water. Denies any recent infections.  Wt Readings from Last 3 Encounters:  08/11/22 90 lb 9.6 oz (41.1 kg)  07/19/22 89 lb (40.4 kg)  07/01/22 89 lb (40.4 kg)   Current Outpatient Medications  Medication Sig Dispense Refill   albuterol (PROVENTIL) (2.5 MG/3ML) 0.083% nebulizer solution Take 3 mLs (2.5 mg total) by nebulization every 6 (six) hours as needed for wheezing or shortness of breath. 90 mL 12   aspirin 81 MG EC tablet Take 1 tablet (81 mg total) by mouth daily. Swallow whole. 30 tablet 0   atorvastatin (LIPITOR) 80 MG tablet TAKE 1 TABLET DAILY AT 6 P.M. (KEEP UPCOMING APPOINTMENT FOR FUTURE REFILLS 30 tablet 11   carvedilol (COREG) 3.125 MG tablet TAKE 1 TABLET TWICE A DAY WITH MEALS 60 tablet 11   empagliflozin (JARDIANCE) 10 MG TABS tablet Take 1 tablet (10 mg total) by mouth daily. 90 tablet 3   estrogens, conjugated, (PREMARIN) 1.25 MG tablet Take 1.25 mg by mouth daily.     feeding supplement (ENSURE ENLIVE / ENSURE PLUS) LIQD Take 237 mLs by mouth 2 (two) times daily between meals. 237 mL 0   furosemide (LASIX) 40 MG tablet TAKE ONE-HALF (1/2) TABLET (20 MG TOTAL) DAILY (KEEP UPCOMING APPOINTMENT FOR FUTURE REFILLS) 30 tablet 5   hydrOXYzine (ATARAX/VISTARIL) 25 MG tablet Take 0.5 tablets (12.5 mg total) by mouth every 8 (eight) hours as needed for itching.  30 tablet 0   ipratropium-albuterol (DUONEB) 0.5-2.5 (3) MG/3ML SOLN Take 3 mLs by nebulization every 6 (six) hours as needed. 360 mL 0   lamoTRIgine (LAMICTAL) 100 MG tablet Take 1 tablet (100 mg total) by mouth 2 (two) times daily. 180 tablet 1   losartan (COZAAR) 50 MG tablet Take 0.5 tablets (25 mg total) by mouth daily. 90 tablet 3   potassium chloride SA (KLOR-CON M) 20 MEQ tablet TAKE 1 TABLET DAILY (KEEP UPCOMING APPOINTMENT FOR FUTURE REFILLS) 30 tablet 11   progesterone (PROMETRIUM) 200 MG capsule Take 200 mg by mouth at bedtime.     levothyroxine (SYNTHROID) 25 MCG tablet Take 1 tablet (25 mcg total) by mouth daily. (Patient not taking: Reported on 08/11/2022) 30 tablet 1   No current facility-administered medications for this visit.   Allergies  Allergen Reactions   Azithromycin Nausea And Vomiting   Erythromycin     Sensitivity (GI)   Percocet [Oxycodone-Acetaminophen]    Penicillins Itching and Rash   Review of Systems: All systems reviewed and negative except where noted in HPI.   Physical Exam: BP 118/70   Pulse 60   Ht 5' 1.5" (1.562 m)   Wt 90 lb 9.6 oz (41.1 kg)   BMI 16.84 kg/m  Constitutional: Pleasant,well-developed, female in no acute distress. HEENT: Normocephalic and atraumatic. Conjunctivae are normal. No scleral icterus. Cardiovascular: Normal rate, regular rhythm.  Pulmonary/chest: Effort  normal and breath sounds normal. No wheezing, rales or rhonchi. Abdominal: Soft, nondistended, nontender. Bowel sounds active throughout. There are no masses palpable. No hepatomegaly. Extremities: No edema Neurological: Alert and oriented to person place and time. Skin: Skin is warm and dry. No rashes noted. Psychiatric: Normal mood and affect. Behavior is normal.  Labs 06/2022: CBC nml. CMP with mildly elevated Cr of 1.03 and mildly elevated ALT of 46. TSH elevated at 6.4.   CT A/P w/o contrast 11/03/21: IMPRESSION: Small bilateral pleural effusions.  Bibasilar  atelectasis. Coronary artery disease.  Aortic atherosclerosis. No definite acute process in the abdomen or pelvis.  RUQ U/S 11/03/21: IMPRESSION: 1. Unremarkable sonographic appearance of the liver. 2. Minimal layering sludge within the gallbladder. No gallstone or evidence of cholecystitis.  TTE 03/02/22: IMPRESSIONS   1. Left ventricular ejection fraction, by estimation, is 50 to 55%. The  left ventricle has low normal function. The left ventricle has no regional  wall motion abnormalities. Left ventricular diastolic parameters are  indeterminate. Elevated left atrial pressure.   2. Right ventricular systolic function is normal. The right ventricular  size is normal. There is normal pulmonary artery systolic pressure.   3. Left atrial size was mild to moderately dilated.   4. The mitral valve is degenerative. Mild mitral valve regurgitation.  Mild mitral stenosis. The mean mitral valve gradient is 3.0 mmHg.   5. Functionally bicuspid. The left and non-coronary cusps are essentially  fused. The aortic valve is calcified. There is severe calcifcation of the  aortic valve. There is severe thickening of the aortic valve. Aortic valve  regurgitation is mild to  moderate. Mild aortic valve stenosis. Aortic regurgitation PHT measures  414 msec. Aortic valve area, by VTI measures 1.03 cm. Aortic valve mean  gradient measures 12.0 mmHg. Aortic valve Vmax measures 2.34 m/s.   6. The inferior vena cava is normal in size with greater than 50%  respiratory variability, suggesting right atrial pressure of 3 mmHg.   Colonoscopy 07/19/22: - The examined portion of the ileum was normal. - Localized severe inflammation was found in the cecum. Biopsied. - One 3 mm polyp in the rectum, removed with a cold snare. Resected and retrieved. - Non-bleeding internal hemorrhoids. Path: 1. Surgical [P], right colon FOCAL ACUTE COLITIS WITH EROSION/ULCERATION NEGATIVE FOR DYSPLASIA AND GRANULOMAS 2.  Surgical [P], left colon FOCAL ACUTE COLITIS/CRYPTITIS 3. Surgical [P], colon, rectum, polyp (1) HYPERPLASTIC POLYP WITH CHANGES OF MUCOSAL PROLAPSE NEGATIVE FOR DYSPLASIA AND CARCINOMA Microscopic Comment 1. -2. The right and left colon biopsies show multiple fragments of colonic mucosa. Within the right colon biopsies several fragments exhibit mild architectural distortion in the form of elongated gated tortuous and branching crypts within a lamina propria showing an increased and focally mixed mononuclear cell infiltrate including neutrophils which infiltrate the epithelium. Within 1 fragment of the lamina propria is hyalinized with overlying surface erosion suggestive of a chronic ulcer. The left colon biopsies exhibit a normal crypt architecture with a patchy variable increased mononuclear cell infiltrate including neutrophils. Focally these neutrophils infiltrate the crypt epithelium. In both of the biopsies there is no increase in intraepithelial lymphocytes and the collagen table is of normal thickness. No granulomas or parasites are seen. And there is no evidence of dysplasia or carcinoma. Within the right colon biopsies architectural distortion is felt to be related to the erosion/ulceration and is otherwise nonspecific. These changes are not definitive for chronicity but evolving chronicity cannot be ruled out. Overall the findings of focal active  colitis is commonly associated with a resolving acute self-limited colitis; however, certain drugs particularly NSAIDs may elicit similar changes. Early idiopathic inflammatory bowel disease may also present with these changes although this is not favored. Clinical, microbiologic and endoscopic correlation commended.  ASSESSMENT AND PLAN: Colitis Patient had a recent colonoscopy in 07/2022 that showed significant inflammation in the cecum. Biopsies showed colitis with unclear chronicity that could be associated with NSAID use, early IBD, ischemia,  or recent infection. Patient does not describe any recent NSAID use or infections. I did discuss with the patient that we could potentially pursue further work up to look for a source of her colitis, but since the patient is relatively asymptomatic at this time, will plan for a repeat colonoscopy in 5 years for closer follow up. If the patient were to develop diarrhea, rectal bleeding, or worsened ab pain in the interim, she will reach out to me to discuss further plans. I asked her to stay hydrated to prevent any potential hypotension.  - Aim to drink 8 cups of water per day - Next colonoscopy is due in 07/2027 - RTC PRN  Christia Reading, MD  I spent 37 minutes of time, including in depth chart review, independent review of results as outlined above, communicating results with the patient directly, face-to-face time with the patient, coordinating care, ordering studies and medications as appropriate, and documentation.

## 2022-08-18 ENCOUNTER — Other Ambulatory Visit: Payer: Self-pay | Admitting: Neurology

## 2022-09-02 ENCOUNTER — Other Ambulatory Visit: Payer: Self-pay | Admitting: Nurse Practitioner

## 2022-09-02 DIAGNOSIS — E2839 Other primary ovarian failure: Secondary | ICD-10-CM

## 2022-09-02 DIAGNOSIS — Z0000 Encounter for general adult medical examination without abnormal findings: Secondary | ICD-10-CM

## 2022-09-02 DIAGNOSIS — Z1211 Encounter for screening for malignant neoplasm of colon: Secondary | ICD-10-CM

## 2022-09-02 DIAGNOSIS — Z780 Asymptomatic menopausal state: Secondary | ICD-10-CM

## 2022-09-07 ENCOUNTER — Encounter: Payer: Self-pay | Admitting: Nurse Practitioner

## 2022-09-07 ENCOUNTER — Ambulatory Visit
Admission: RE | Admit: 2022-09-07 | Discharge: 2022-09-07 | Disposition: A | Discharge status: Home / Self Care | Payer: Medicare Other | Source: Ambulatory Visit | Attending: Physician Assistant | Admitting: Physician Assistant

## 2022-09-07 DIAGNOSIS — Z780 Asymptomatic menopausal state: Secondary | ICD-10-CM

## 2022-09-07 DIAGNOSIS — E2839 Other primary ovarian failure: Secondary | ICD-10-CM

## 2022-09-22 ENCOUNTER — Other Ambulatory Visit: Payer: Self-pay

## 2022-09-22 MED ORDER — CARVEDILOL 3.125 MG PO TABS: 3.1250 mg | ORAL_TABLET | Freq: Two times a day (BID) | ORAL | 0 refills | Status: DC

## 2022-10-04 ENCOUNTER — Ambulatory Visit: Payer: Medicare Other | Attending: Nurse Practitioner | Admitting: Nurse Practitioner

## 2022-10-04 ENCOUNTER — Encounter: Payer: Self-pay | Admitting: Nurse Practitioner

## 2022-10-04 VITALS — BP 118/60 | HR 60 | Ht 61.5 in | Wt 87.8 lb

## 2022-10-04 DIAGNOSIS — Z720 Tobacco use: Secondary | ICD-10-CM | POA: Insufficient documentation

## 2022-10-04 DIAGNOSIS — I2510 Atherosclerotic heart disease of native coronary artery without angina pectoris: Secondary | ICD-10-CM

## 2022-10-04 DIAGNOSIS — E785 Hyperlipidemia, unspecified: Secondary | ICD-10-CM | POA: Diagnosis not present

## 2022-10-04 DIAGNOSIS — I428 Other cardiomyopathies: Secondary | ICD-10-CM | POA: Diagnosis not present

## 2022-10-04 DIAGNOSIS — I350 Nonrheumatic aortic (valve) stenosis: Secondary | ICD-10-CM

## 2022-10-04 NOTE — Patient Instructions (Signed)
Medication Instructions:  Your physician recommends that you continue on your current medications as directed. Please refer to the Current Medication list given to you today.   *If you need a refill on your cardiac medications before your next appointment, please call your pharmacy*   Lab Work: NONE ordered at this time of appointment   If you have labs (blood work) drawn today and your tests are completely normal, you will receive your results only by: [place] (if you have MyChart) OR A paper copy in the mail If you have any lab test that is abnormal or we need to change your treatment, we will call you to review the results.   Testing/Procedures: NONE ordered at this time of appointment     Follow-Up: At [facility], you and your health needs are our priority.  As part of our continuing mission to provide you with exceptional heart care, we have created designated Provider Care Teams.  These Care Teams include your primary Cardiologist (physician) and Advanced Practice Providers (APPs -  Physician Assistants and Nurse Practitioners) who all work together to provide you with the care you need, when you need it.  We recommend signing up for the patient portal called "MyChart".  Sign up information is provided on this After Visit Summary.  MyChart is used to connect with patients for Virtual Visits (Telemedicine).  Patients are able to view lab/test results, encounter notes, upcoming appointments, etc.  Non-urgent messages can be sent to your provider as well.   To learn more about what you can do with MyChart, go to [email or web address].    Your next appointment:   6 month(s)  Provider:   Quay Burow, MD     Other Instructions

## 2022-10-04 NOTE — Progress Notes (Signed)
Office Visit    Patient Name: Christie Moreno Date of Encounter: 10/04/2022  Primary Care Provider:  Lorrene Reid, PA-C (Inactive) Primary Cardiologist:  Quay Burow, MD  Chief Complaint    67 year old female with a history of NSTEMI, nonobstructive CAD, NICM, aortic stenosis, hyperlipidemia, and tobacco use who presents for follow-up related to CAD and aortic stenosis.   Past Medical History    Past Medical History:  Diagnosis Date   Heart failure ([place])    Seizures ([place])    none in years, recently had medication change   Past Surgical History:  Procedure Laterality Date   COLONOSCOPY     RIGHT/LEFT HEART CATH AND CORONARY ANGIOGRAPHY N/A 11/05/2021   Procedure: RIGHT/LEFT HEART CATH AND CORONARY ANGIOGRAPHY;  Surgeon: Belva Crome, MD;  Location: [place] [facility];  Service: Cardiovascular;  Laterality: N/A;    Allergies  Allergies  Allergen Reactions   Azithromycin Nausea And Vomiting   Erythromycin     Sensitivity (GI)   Percocet [Oxycodone-Acetaminophen]    Penicillins Itching and Rash     Labs/Other Studies Reviewed    The following studies were reviewed today: Echo 10/2021: IMPRESSIONS    1. Apical hypokinesis with remaining walls hypokinetic; overall mild to  moderate LV dysfunction; calcified aortic valve but no AS by doppler;  moderate MAC with mild mitral stenosis.   2. Left ventricular ejection fraction, by estimation, is 40 to 45%. The  left ventricle has mild to moderately decreased function. The left  ventricle demonstrates regional wall motion abnormalities (see scoring  diagram/findings for description). There is   mild left ventricular hypertrophy. Left ventricular diastolic parameters  are consistent with Grade I diastolic dysfunction (impaired relaxation).  Elevated left atrial pressure.   3. Right ventricular systolic function is normal. The right ventricular  size is normal.   4. The mitral valve is normal in structure.  Trivial mitral valve  regurgitation. Mild mitral stenosis. Moderate mitral annular  calcification.   5. The aortic valve is calcified. Aortic valve regurgitation is trivial.  No aortic stenosis is present.   6. The inferior vena cava is normal in size with greater than 50%  respiratory variability, suggesting right atrial pressure of 3 mmHg.   R/LHC 10/2021: Left Anterior Descending The vessel exhibits minimal luminal irregularities.  First Diagonal Branch 1st Diag lesion is 75% stenosed.  Second Diagonal Branch Vessel is small in size.  Left Circumflex The vessel exhibits minimal luminal irregularities. Mid Cx lesion is 40% stenosed. Mid Cx to Dist Cx lesion is 75% stenosed.  First Obtuse Marginal Branch Vessel is small in size.  Second Obtuse Marginal Branch 2nd Mrg lesion is 40% stenosed.  Right Coronary Artery There is moderate diffuse disease throughout the vessel.  Right Posterior Descending Artery Vessel is small in size. RPDA lesion is 30% stenosed.  First Right Posterolateral Branch Vessel is large in size.  Echo 03/2022: IMPRESSIONS    1. Left ventricular ejection fraction, by estimation, is 50 to 55%. The  left ventricle has low normal function. The left ventricle has no regional  wall motion abnormalities. Left ventricular diastolic parameters are  indeterminate. Elevated left atrial  pressure.   2. Right ventricular systolic function is normal. The right ventricular  size is normal. There is normal pulmonary artery systolic pressure.   3. Left atrial size was mild to moderately dilated.   4. The mitral valve is degenerative. Mild mitral valve regurgitation.  Mild mitral stenosis. The mean mitral valve gradient is  3.0 mmHg.   5. Functionally bicuspid. The left and non-coronary cusps are essentially  fused. The aortic valve is calcified. There is severe calcifcation of the  aortic valve. There is severe thickening of the aortic valve. Aortic valve   regurgitation is mild to  moderate. Mild aortic valve stenosis. Aortic regurgitation PHT measures  414 msec. Aortic valve area, by VTI measures 1.03 cm. Aortic valve mean  gradient measures 12.0 mmHg. Aortic valve Vmax measures 2.34 m/s.   6. The inferior vena cava is normal in size with greater than 50%  respiratory variability, suggesting right atrial pressure of 3 mmHg.   Recent Labs: 11/02/2021: B Natriuretic Peptide 2,444.1 11/05/2021: Magnesium 2.0 06/18/2022: ALT 46; BUN 26; Creatinine, Ser 1.03; Hemoglobin 12.9; Platelets 184; Potassium 4.4; Sodium 138; TSH 6.400  Recent Lipid Panel    Component Value Date/Time   CHOL 167 06/18/2022 0905   TRIG 62 06/18/2022 0905   HDL 98 06/18/2022 0905   CHOLHDL 1.7 06/18/2022 0905   CHOLHDL 1.7 11/02/2021 1122   VLDL 3 11/02/2021 1122   LDLCALC 57 06/18/2022 0905    History of Present Illness    67 year old female with thee above past medical history including NSTEMI, nonobstructive CAD, NICM, aortic stenosis, hyperlipidemia, and tobacco use.  She was hospitalized in April 2023 in the setting of NSTEMI, acute systolic heart failure.  Echocardiogram at the time revealed EF 20 to 25%, mild to moderately decreased LV function, G1 DD, mild LVH, mild mitral valve stenosis.  Cardiac catheterization revealed nonobstructive CAD.  Repeat echocardiogram in 03/2022 showed EF improved to 50 to 55%, mild mitral valve regurgitation, mild mitral valve stenosis, mild aortic stenosis, mean gradient 12 mmHg.  She was last seen in the office on 04/06/2022 and was doing well from a cardiac standpoint.   She presents today for follow-up.  Since her last visit he has been stable from a cardiac standpoint.  She had an episode of "irritation" around her heart that occurred yesterday after eating soup that did not settle well with her stomach.  She thinks her symptoms were really related to indigestion.  She also has stable mild nonpitting bilateral lower extremity edema.   She denies any other chest discomfort, denies dyspnea, PND, orthopnea, weight gain.  Overall, she reports feeling well.  Home Medications    Current Outpatient Medications  Medication Sig Dispense Refill   albuterol (PROVENTIL) (2.5 MG/3ML) 0.083% nebulizer solution Take 3 mLs (2.5 mg total) by nebulization every 6 (six) hours as needed for wheezing or shortness of breath. 90 mL 12   aspirin 81 MG EC tablet Take 1 tablet (81 mg total) by mouth daily. Swallow whole. 30 tablet 0   atorvastatin (LIPITOR) 80 MG tablet TAKE 1 TABLET DAILY AT 6 P.M. (KEEP UPCOMING APPOINTMENT FOR FUTURE REFILLS 30 tablet 11   carvedilol (COREG) 3.125 MG tablet Take 1 tablet (3.125 mg total) by mouth 2 (two) times daily with a meal. 180 tablet 0   empagliflozin (JARDIANCE) 10 MG TABS tablet Take 1 tablet (10 mg total) by mouth daily. 90 tablet 3   estrogens, conjugated, (PREMARIN) 1.25 MG tablet Take 1.25 mg by mouth daily.     feeding supplement (ENSURE ENLIVE / ENSURE PLUS) LIQD Take 237 mLs by mouth 2 (two) times daily between meals. 237 mL 0   furosemide (LASIX) 40 MG tablet TAKE ONE-HALF (1/2) TABLET (20 MG TOTAL) DAILY (KEEP UPCOMING APPOINTMENT FOR FUTURE REFILLS) 30 tablet 5   hydrOXYzine (ATARAX/VISTARIL) 25 MG tablet Take  0.5 tablets (12.5 mg total) by mouth every 8 (eight) hours as needed for itching. 30 tablet 0   ipratropium-albuterol (DUONEB) 0.5-2.5 (3) MG/3ML SOLN Take 3 mLs by nebulization every 6 (six) hours as needed. 360 mL 0   lamoTRIgine (LAMICTAL) 100 MG tablet TAKE 1 TABLET TWICE A DAY 180 tablet 0   levothyroxine (SYNTHROID) 25 MCG tablet Take 1 tablet (25 mcg total) by mouth daily. 30 tablet 1   losartan (COZAAR) 50 MG tablet Take 0.5 tablets (25 mg total) by mouth daily. 90 tablet 3   potassium chloride SA (KLOR-CON M) 20 MEQ tablet TAKE 1 TABLET DAILY (KEEP UPCOMING APPOINTMENT FOR FUTURE REFILLS) 30 tablet 11   progesterone (PROMETRIUM) 200 MG capsule Take 200 mg by mouth at bedtime.      No current facility-administered medications for this visit.     Review of Systems    She denies chest pain, palpitations, dyspnea, pnd, orthopnea, n, v, dizziness, syncope, edema, weight gain, or early satiety. All other systems reviewed and are otherwise negative except as noted above.   Physical Exam    VS:  BP 118/60 (BP Location: Left Arm, Patient Position: Sitting, Cuff Size: Normal)   Pulse 60   Ht 5' 1.5" (1.562 m)   Wt 87 lb 12.8 oz (39.8 kg)   SpO2 99%   BMI 16.32 kg/m  GEN: Well nourished, well developed, in no acute distress. HEENT: normal. Neck: Supple, no JVD, carotid bruits, or masses. Cardiac: RRR, 2/6 murmur, no rubs, or gallops. No clubbing, cyanosis, edema.  Radials/DP/PT 2+ and equal bilaterally.  Respiratory:  Respirations regular and unlabored, clear to auscultation bilaterally. GI: Soft, nontender, nondistended, BS + x 4. MS: no deformity or atrophy. Skin: warm and dry, no rash. Neuro:  Strength and sensation are intact. Psych: Normal affect.  Accessory Clinical Findings    ECG personally reviewed by me today -no EKG in office today.   Lab Results  Component Value Date   WBC 8.7 06/18/2022   HGB 12.9 06/18/2022   HCT 39.8 06/18/2022   MCV 87 06/18/2022   PLT 184 06/18/2022   Lab Results  Component Value Date   CREATININE 1.03 (H) 06/18/2022   BUN 26 06/18/2022   NA 138 06/18/2022   K 4.4 06/18/2022   CL 98 06/18/2022   CO2 24 06/18/2022   Lab Results  Component Value Date   ALT 46 (H) 06/18/2022   AST 34 06/18/2022   ALKPHOS 71 06/18/2022   BILITOT 0.4 06/18/2022   Lab Results  Component Value Date   CHOL 167 06/18/2022   HDL 98 06/18/2022   LDLCALC 57 06/18/2022   TRIG 62 06/18/2022   CHOLHDL 1.7 06/18/2022    Lab Results  Component Value Date   HGBA1C 5.7 (H) 11/02/2021    Assessment & Plan    1. CAD: Cath in 2023 revealed nonobstructive CAD. Most recent echo in 03/2022 showed EF improved to 50 to 55%, mild mitral valve  regurgitation, mild mitral valve stenosis, mild aortic stenosis, mean gradient 12 mmHg.  Stable with no anginal symptoms.  No indication for ischemic evaluation.  Continue aspirin, carvedilol, losartan, Jardiance, Lasix, Lipitor.  2. NICM: Most recent echo as above with recovered EF.  She has stable mild nonpitting bilateral lower extremity edema, generally euvolemic, compensated on exam.  Continue current medications as above.  3. Aortic stenosis: Mild on most recent echo. Plan for repeat echo in 04/2023.   4. Hyperlipidemia: LDL was 57 in 06/2022.  Continue aspirin, Lipitor.  5. Tobacco use: She continues to smoke. Full cessation advised.  6. Hypothyroidism: Recently prescribed levothyroxine per PCP.  She has not started taking this medication. Recommend follow-up with PCP.  7. Disposition: Follow-up as scheduled with Dr. Gwenlyn Found in 04/2023.      Lenna Sciara, NP 10/04/2022, 6:01 PM

## 2022-11-16 ENCOUNTER — Other Ambulatory Visit: Payer: Self-pay | Admitting: Neurology

## 2022-11-18 ENCOUNTER — Other Ambulatory Visit: Payer: Medicare Other

## 2022-11-18 ENCOUNTER — Other Ambulatory Visit: Payer: Self-pay

## 2022-11-18 DIAGNOSIS — I5022 Chronic systolic (congestive) heart failure: Secondary | ICD-10-CM

## 2022-11-18 DIAGNOSIS — E039 Hypothyroidism, unspecified: Secondary | ICD-10-CM

## 2022-11-22 ENCOUNTER — Other Ambulatory Visit: Payer: Medicare Other

## 2022-11-24 ENCOUNTER — Encounter: Payer: Self-pay | Admitting: Family Medicine

## 2022-11-24 ENCOUNTER — Ambulatory Visit (INDEPENDENT_AMBULATORY_CARE_PROVIDER_SITE_OTHER): Payer: Medicare Other | Admitting: Family Medicine

## 2022-11-24 VITALS — BP 144/71 | HR 64 | Resp 20 | Ht 61.5 in | Wt 87.0 lb

## 2022-11-24 DIAGNOSIS — E039 Hypothyroidism, unspecified: Secondary | ICD-10-CM | POA: Diagnosis not present

## 2022-11-24 DIAGNOSIS — Z716 Tobacco abuse counseling: Secondary | ICD-10-CM

## 2022-11-24 DIAGNOSIS — F17200 Nicotine dependence, unspecified, uncomplicated: Secondary | ICD-10-CM

## 2022-11-24 NOTE — Progress Notes (Signed)
Established Patient Office Visit  Subjective   Patient ID: Christie Moreno, female    DOB: 1955-12-01  Age: 67 y.o. MRN: [id number]  Chief Complaint  Patient presents with   Hypothyroidism    HPI Christie Moreno is a 67 y.o. female presenting today for follow up of hypothyroidism.  She was diagnosed with hypothyroidism based on TSH of 4.84 on 8-23 and 6.4 on 06/18/2022.  Recommended to start levothyroxine 25 mcg daily, but patient read about the side effects and was nervous about starting it.  She did pick up the prescription, but she has not taken any of it thus far. Patient denies denies fatigue, weight changes, heat/cold intolerance, bowel/skin changes, or CVS symptoms.    ROS Negative unless otherwise noted in HPI   Objective:     BP (!) 144/71 (BP Location: Left Arm, Patient Position: Sitting, Cuff Size: Small)   Pulse 64   Resp 20   Ht 5' 1.5" (1.562 m)   Wt 87 lb (39.5 kg)   BMI 16.17 kg/m   Physical Exam Constitutional:      General: She is not in acute distress.    Appearance: Normal appearance.  HENT:     Head: Normocephalic and atraumatic.  Cardiovascular:     Rate and Rhythm: Normal rate and regular rhythm.     Pulses: Normal pulses.     Heart sounds: No murmur heard.    No friction rub. No gallop.  Pulmonary:     Effort: Pulmonary effort is normal. No respiratory distress.     Breath sounds: No wheezing, rhonchi or rales.  Skin:    General: Skin is warm and dry.  Neurological:     Mental Status: She is alert and oriented to person, place, and time.     Assessment & Plan:  Hypothyroidism, unspecified type Assessment & Plan: We discussed some of her concerns as well as the importance of treating hypothyroidism.  Reassured her that we are starting at a very low dose and will make appropriate adjustments as indicated by her symptoms.  Patient is agreeable to start levothyroxine 25 mcg daily.  Rechecking thyroid labs in about 4 weeks.  Will continue  to monitor.  Orders: -     TSH; Future -     T4, free; Future -     T3, free; Future  Tobacco dependence Assessment & Plan: Patient knows that she should stop smoking because it would be better for her health, but she finds that she does enjoy it and it helps her to manage her stress.  Recently she has been trying to cover the bills on her own which has increased her stress levels.  She states that during her time in the hospital the nicotine patches were very helpful in curbing her desire to smoke.  She would be open to quitting if she knew that her stress levels were being managed well. At follow-up appointment, we will discuss options for stress management including therapy and medication support.  Once stress levels are well-managed and she has developed other ways of coping with it, she is open to quitting.  We will discuss support including patches, gum, lozenges as well as bupropion and Chantix.    Return in about 4 weeks (around 12/22/2022) for blood work (not fasting); follow up for thyroid and stress 1 week after labs.   I spent 40 minutes on the day of the encounter to include pre-visit record review and face-to-face time with the patient providing  education and counseling regarding hypothyroidism, levothyroxine, and tobacco cessation.  Melida Quitter, PA

## 2022-11-24 NOTE — Assessment & Plan Note (Signed)
Patient knows that she should stop smoking because it would be better for her health, but she finds that she does enjoy it and it helps her to manage her stress.  Recently she has been trying to cover the bills on her own which has increased her stress levels.  She states that during her time in the hospital the nicotine patches were very helpful in curbing her desire to smoke.  She would be open to quitting if she knew that her stress levels were being managed well. At follow-up appointment, we will discuss options for stress management including therapy and medication support.  Once stress levels are well-managed and she has developed other ways of coping with it, she is open to quitting.  We will discuss support including patches, gum, lozenges as well as bupropion and Chantix.

## 2022-11-24 NOTE — Assessment & Plan Note (Signed)
We discussed some of her concerns as well as the importance of treating hypothyroidism.  Reassured her that we are starting at a very low dose and will make appropriate adjustments as indicated by her symptoms.  Patient is agreeable to start levothyroxine 25 mcg daily.  Rechecking thyroid labs in about 4 weeks.  Will continue to monitor.

## 2022-11-24 NOTE — Patient Instructions (Addendum)
START taking levothyroxine every morning.  Your challenge for the next month is to find something that you enjoy doing that helps you to relax and try to do it once a week.  For your stomach pain, try Tums.  If Tums makes the pain better, it is most likely an ulcer and we can try something stronger at that point.

## 2022-12-01 ENCOUNTER — Other Ambulatory Visit: Payer: Self-pay | Admitting: Neurology

## 2022-12-08 IMAGING — MG DIGITAL DIAGNOSTIC BILAT W/ TOMO W/ CAD
8 series · 8 of 24 positions shown · non-contrast
Comparison: Previous exam(s).

CLINICAL DATA: Follow-up for probably benign asymmetry in the RIGHT
breast. This probably benign asymmetry was initially identified on
screening mammogram dated 10/04/2019.

EXAM:
DIGITAL DIAGNOSTIC BILATERAL MAMMOGRAM WITH TOMOSYNTHESIS AND CAD
TECHNIQUE: Bilateral digital diagnostic mammography and breast tomosynthesis
was performed. The images were evaluated with computer-aided
detection.

[L MLO synth-2D]
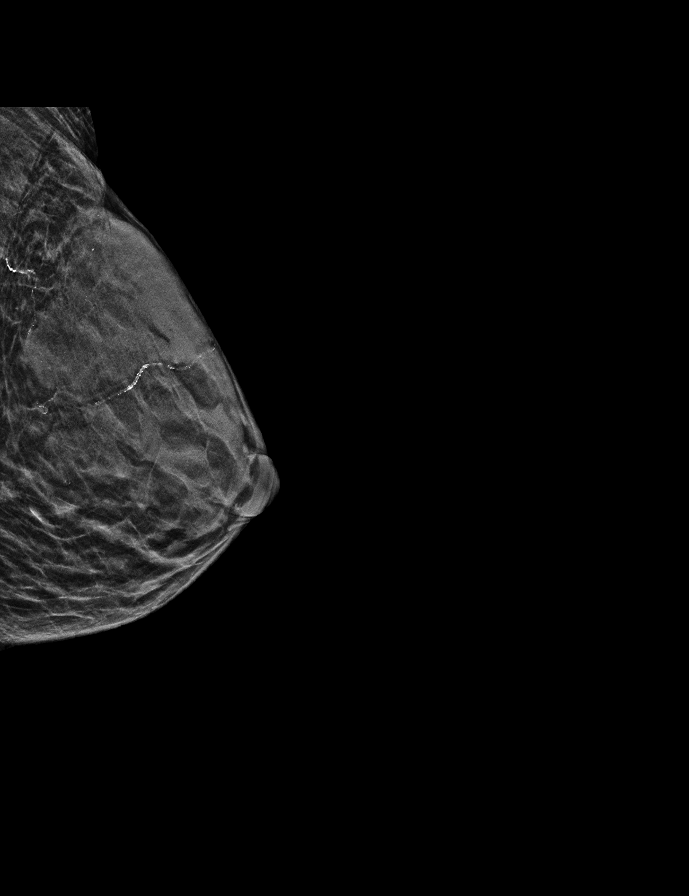

[R CC synth-2D]
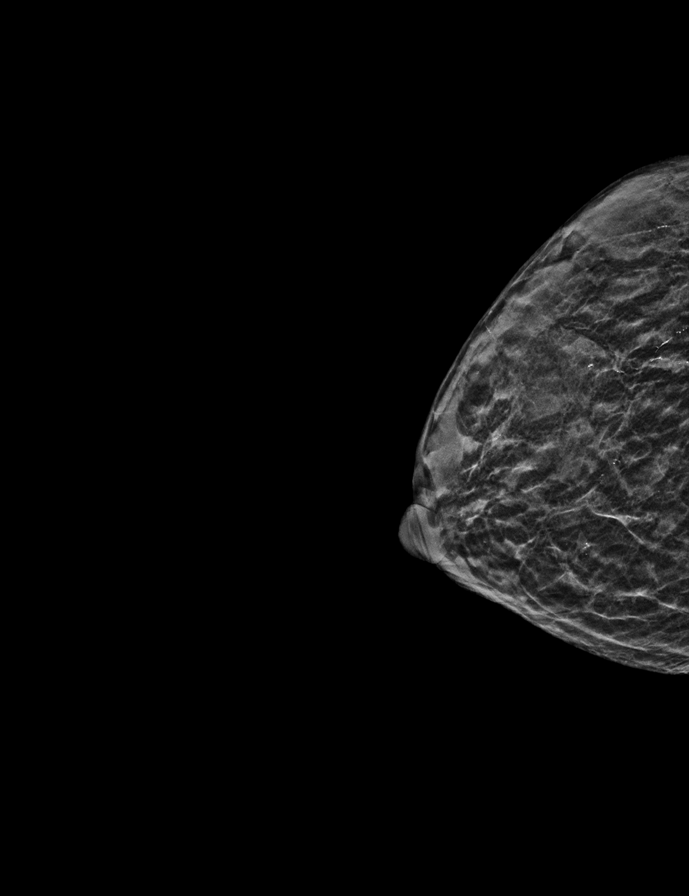

[L CC synth-2D]
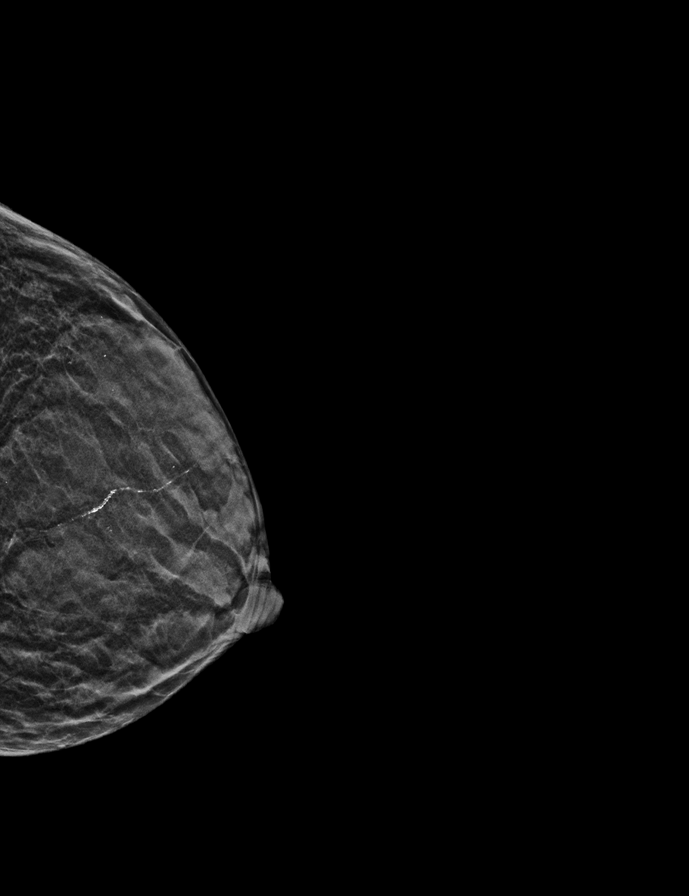

[R MLO synth-2D]
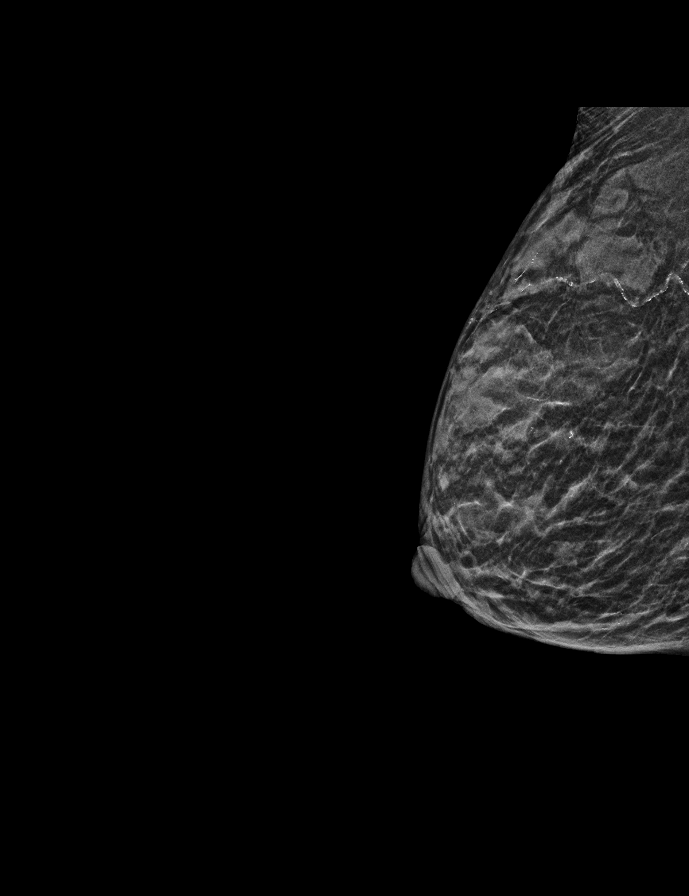

[L MLO tomo · tomo slice 13/25.0]
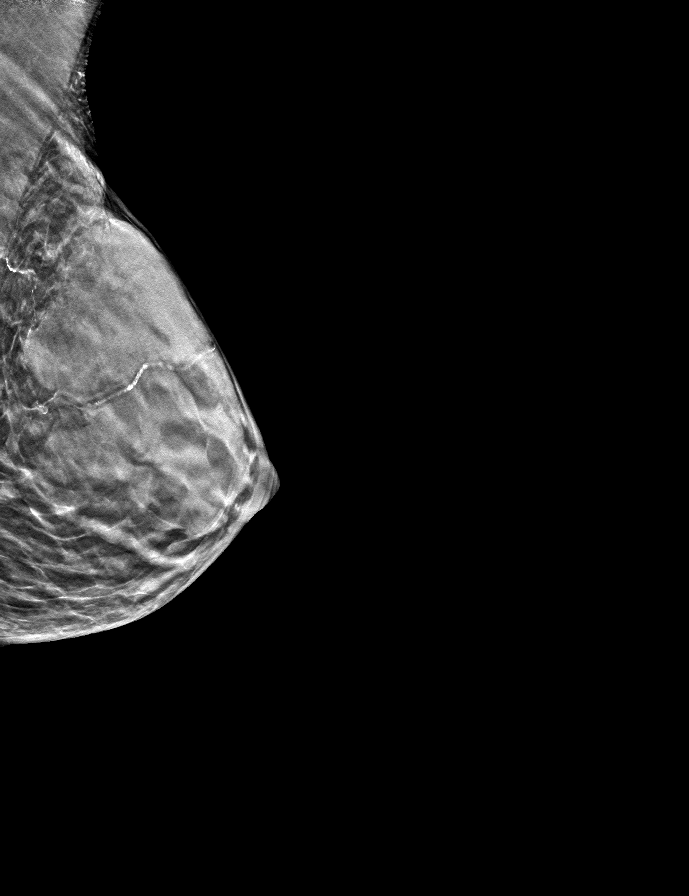

[R CC tomo · tomo slice 13/24.0]
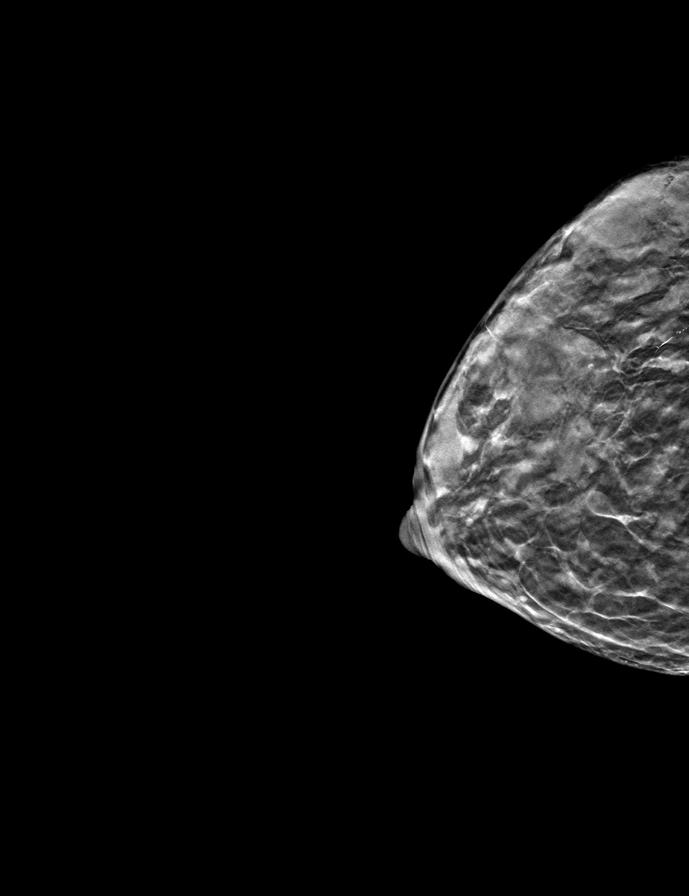

[L CC tomo · tomo slice 11/22.0]
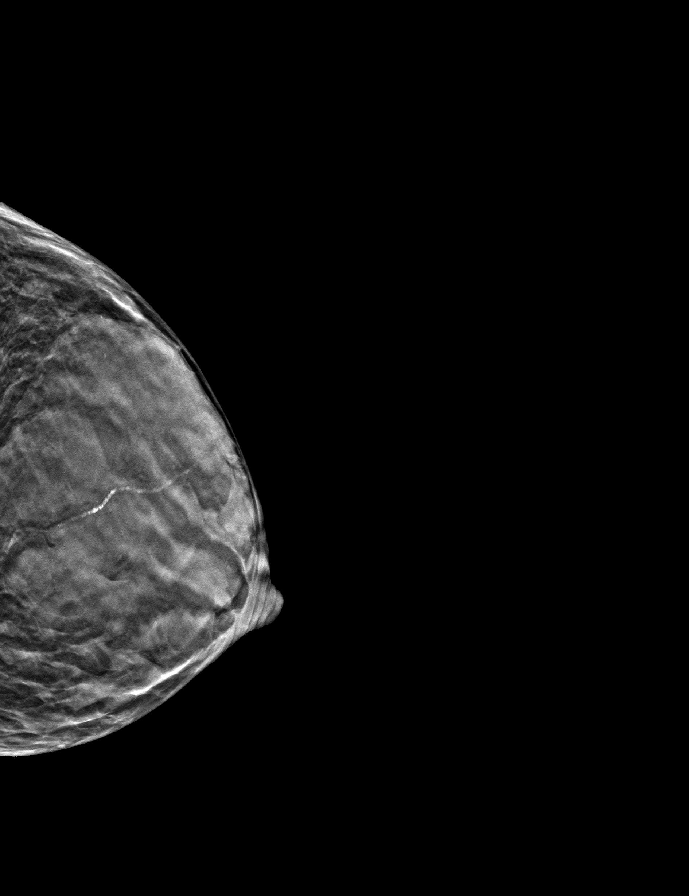

[R MLO tomo · tomo slice 13/26.0]
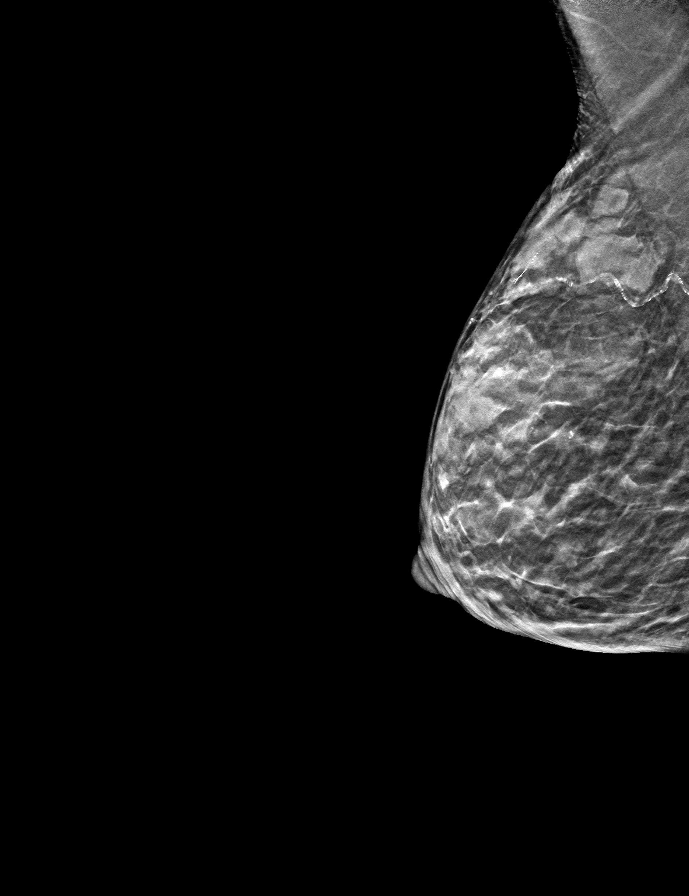

[8 of 24 positions shown; findings below may reference images not displayed]

ACR Breast Density Category d: The breast tissue is extremely dense,
which lowers the sensitivity of mammography.
FINDINGS: The previously questioned asymmetry within the outer RIGHT breast,
at posterior depth, is stable, and now shown stable for greater than
2 years confirming benignity, most compatible with an island of
normal fibroglandular tissue.

There are no new dominant masses, suspicious calcifications or
secondary signs of malignancy within either breast.
IMPRESSION: No evidence of malignancy within either breast.

Patient may return to routine annual bilateral screening mammogram
schedule.

RECOMMENDATION:
Screening mammogram in one year.(Code:[id number]-[id number])

I have discussed the findings and recommendations with the patient.
If applicable, a reminder letter will be sent to the patient
regarding the next appointment.

BI-RADS CATEGORY  2: Benign.

## 2022-12-17 ENCOUNTER — Telehealth: Payer: Self-pay | Admitting: Cardiovascular Disease

## 2022-12-17 ENCOUNTER — Other Ambulatory Visit: Payer: Self-pay

## 2022-12-17 MED ORDER — POTASSIUM CHLORIDE CRYS ER 20 MEQ PO TBCR: EXTENDED_RELEASE_TABLET | ORAL | 0 refills | Status: DC

## 2022-12-17 NOTE — Telephone Encounter (Signed)
*  STAT* If patient is at the pharmacy, call can be transferred to refill team.   1. Which medications need to be refilled? (please list name of each medication and dose if known)   potassium chloride SA (KLOR-CON M) 20 MEQ tablet   2. Which pharmacy/location (including street and city if local pharmacy) is medication to be sent to?  [id number] - [place], [place] - [address] AT [facility] [address]   3. Do they need a 30 day or 90 day supply? 10 day    Patient stated she is completely out of this medication and will need a 10 day supply to go to her local [facility] until her complete prescription comes in from [facility].

## 2022-12-18 ENCOUNTER — Telehealth: Payer: Self-pay | Admitting: Student

## 2022-12-18 ENCOUNTER — Other Ambulatory Visit: Payer: Self-pay | Admitting: Physician Assistant

## 2022-12-18 MED ORDER — POTASSIUM CHLORIDE CRYS ER 20 MEQ PO TBCR: EXTENDED_RELEASE_TABLET | ORAL | 0 refills | Status: DC

## 2022-12-18 NOTE — Telephone Encounter (Signed)
   Patient called After Hours Line requesting refill of her potassium. Called and spoke with patient. She takes potassium chloride 20 mEq once daily. She usually gets a 30 day prescription sent into [facility] but has been out of her potassium for 3 days. She called yesterday and requested a 10 day prescription be sent into the [facility] until her usual prescription was delivered by [facility]. However, it looks like a 90 day supply was sent to [facility] rather than the 10 day supply to the [facility]. Will send in 10 day supply of potassium chloride 20 mEq to the [facility] as requested.   Corrin Parker, PA-C 12/18/2022 4:40 PM

## 2022-12-22 ENCOUNTER — Other Ambulatory Visit: Payer: Medicare Other

## 2022-12-22 ENCOUNTER — Telehealth: Payer: Self-pay | Admitting: *Deleted

## 2022-12-22 DIAGNOSIS — E039 Hypothyroidism, unspecified: Secondary | ICD-10-CM

## 2022-12-22 NOTE — Telephone Encounter (Signed)
Pt asked about taking her levothyroxine, she said she thinks she has a refill at pharmacy.  She said she just started taking the medication like a month ago.  Instructed her that if she is currently taking this she should continue so she is going to get the refill. I told her that labs that were getting would determine if she should continue or if the dosage should change. Routing to provider as an Financial planner.

## 2022-12-25 LAB — T3, FREE: T3, Free: 1.8 pg/mL — ABNORMAL LOW (ref 2.0–4.4)

## 2022-12-25 LAB — TSH: TSH: 2.56 u[IU]/mL (ref 0.450–4.500)

## 2022-12-25 LAB — T4, FREE: Free T4: 1.47 ng/dL (ref 0.82–1.77)

## 2022-12-28 ENCOUNTER — Encounter: Payer: Self-pay | Admitting: Family Medicine

## 2022-12-29 ENCOUNTER — Ambulatory Visit (INDEPENDENT_AMBULATORY_CARE_PROVIDER_SITE_OTHER): Payer: Medicare Other | Admitting: Family Medicine

## 2022-12-29 ENCOUNTER — Encounter: Payer: Self-pay | Admitting: Family Medicine

## 2022-12-29 VITALS — BP 111/70 | HR 64 | Resp 20 | Ht 61.5 in | Wt 86.0 lb

## 2022-12-29 DIAGNOSIS — E039 Hypothyroidism, unspecified: Secondary | ICD-10-CM | POA: Diagnosis not present

## 2022-12-29 MED ORDER — LEVOTHYROXINE SODIUM 25 MCG PO TABS
25.0000 ug | ORAL_TABLET | Freq: Every day | ORAL | 1 refills | Status: DC
Start: 2022-12-29 — End: 2023-01-24

## 2022-12-29 NOTE — Assessment & Plan Note (Signed)
Most recent TSH and T4 within normal limits.  Continue levothyroxine 25 mcg daily.  We will continue watchful waiting to see if diarrhea resolves.  Rechecking thyroid labs before next follow-up.  Will continue to monitor.

## 2022-12-29 NOTE — Progress Notes (Signed)
   Established Patient Office Visit  Subjective   Patient ID: Christie Moreno, female    DOB: April 11, 1956  Age: 67 y.o. MRN: [id number]  Chief Complaint  Patient presents with   Hypothyroidism    HPI Christie Moreno is a 67 y.o. female presenting today for follow up of hypothyroidism. Hypothyroidism: Taking levothyroxine regularly in the AM away from food and vitamins. Denies fatigue, weight changes, heat/cold intolerance, skin/hair changes, CVS symptoms. She does endorse some diarrhea since her last visit, she is not sure if it is due to starting levothyroxine or having a change in the supplier of her progesterone.  She does feel that the diarrhea has been improving the last few days.  ROS Negative unless otherwise noted in HPI   Objective:     BP 111/70 (BP Location: Right Arm, Patient Position: Sitting, Cuff Size: Normal)   Pulse 64   Resp 20   Ht 5' 1.5" (1.562 m)   Wt 86 lb (39 kg) Comment: pt reported  SpO2 96%   BMI 15.99 kg/m   Physical Exam Constitutional:      General: She is not in acute distress.    Appearance: Normal appearance.  HENT:     Head: Normocephalic and atraumatic.  Cardiovascular:     Rate and Rhythm: Normal rate and regular rhythm.     Heart sounds: No murmur heard.    No friction rub. No gallop.  Pulmonary:     Effort: Pulmonary effort is normal. No respiratory distress.     Breath sounds: No wheezing, rhonchi or rales.  Skin:    General: Skin is warm and dry.  Neurological:     Mental Status: She is alert and oriented to person, place, and time.     Assessment & Plan:  Hypothyroidism, unspecified type Assessment & Plan: Most recent TSH and T4 within normal limits.  Continue levothyroxine 25 mcg daily.  We will continue watchful waiting to see if diarrhea resolves.  Rechecking thyroid labs before next follow-up.  Will continue to monitor.  Orders: -     Levothyroxine Sodium; Take 1 tablet (25 mcg total) by mouth daily.  Dispense: 30  tablet; Refill: 1    Return in about 6 weeks (around 02/09/2023) for follow-up for thyroid, blood work (not fasting) 1 week before.    Melida Quitter, PA

## 2023-01-03 ENCOUNTER — Other Ambulatory Visit: Payer: Self-pay | Admitting: Cardiovascular Disease

## 2023-01-07 ENCOUNTER — Other Ambulatory Visit: Payer: Self-pay | Admitting: Cardiovascular Disease

## 2023-01-07 DIAGNOSIS — I5021 Acute systolic (congestive) heart failure: Secondary | ICD-10-CM

## 2023-01-18 ENCOUNTER — Other Ambulatory Visit: Payer: Self-pay | Admitting: Physician Assistant

## 2023-01-21 ENCOUNTER — Other Ambulatory Visit: Payer: Self-pay | Admitting: Nurse Practitioner

## 2023-01-21 DIAGNOSIS — E039 Hypothyroidism, unspecified: Secondary | ICD-10-CM

## 2023-01-24 ENCOUNTER — Other Ambulatory Visit: Payer: Self-pay | Admitting: Cardiovascular Disease

## 2023-01-31 ENCOUNTER — Other Ambulatory Visit: Payer: Self-pay

## 2023-01-31 DIAGNOSIS — E039 Hypothyroidism, unspecified: Secondary | ICD-10-CM

## 2023-02-02 ENCOUNTER — Other Ambulatory Visit: Payer: Medicare Other

## 2023-02-02 DIAGNOSIS — E039 Hypothyroidism, unspecified: Secondary | ICD-10-CM

## 2023-02-03 LAB — T3, FREE: T3, Free: 2.4 pg/mL (ref 2.0–4.4)

## 2023-02-03 LAB — TSH: TSH: 3.5 u[IU]/mL (ref 0.450–4.500)

## 2023-02-03 LAB — T4, FREE: Free T4: 1.46 ng/dL (ref 0.82–1.77)

## 2023-02-04 ENCOUNTER — Other Ambulatory Visit: Payer: Self-pay | Admitting: Neurology

## 2023-02-04 MED ORDER — LAMOTRIGINE 100 MG PO TABS: 100.0000 mg | ORAL_TABLET | Freq: Two times a day (BID) | ORAL | 0 refills | Status: DC

## 2023-02-04 NOTE — Progress Notes (Signed)
I returned phone call from the patient to [facility] answering service and spoke to her.  She requested a 1 month supply of her lamotrigine 100 mg twice daily to be sent to her [facility] pharmacy at [facility].  I sent the prescription

## 2023-02-07 ENCOUNTER — Telehealth: Payer: Self-pay | Admitting: Neurology

## 2023-02-07 ENCOUNTER — Other Ambulatory Visit: Payer: Self-pay

## 2023-02-07 MED ORDER — LAMOTRIGINE 100 MG PO TABS: 100.0000 mg | ORAL_TABLET | Freq: Two times a day (BID) | ORAL | 2 refills | Status: DC

## 2023-02-07 NOTE — Telephone Encounter (Signed)
In response to vm pt left at 10:40 am pt states she has enough of her lamoTRIgine (LAMICTAL) 100 MG tablet until Thurs of this week.  Pt has scheduled her 1 yr f/u and is on wait list, she is asking if enough medication can be called in until her appointment. Pt using [facility] [phone]

## 2023-02-09 ENCOUNTER — Other Ambulatory Visit: Payer: Self-pay

## 2023-02-09 ENCOUNTER — Ambulatory Visit (INDEPENDENT_AMBULATORY_CARE_PROVIDER_SITE_OTHER): Payer: Medicare Other | Admitting: Family Medicine

## 2023-02-09 ENCOUNTER — Encounter: Payer: Self-pay | Admitting: Family Medicine

## 2023-02-09 DIAGNOSIS — E039 Hypothyroidism, unspecified: Secondary | ICD-10-CM

## 2023-02-09 MED ORDER — LEVOTHYROXINE SODIUM 25 MCG PO TABS
25.0000 ug | ORAL_TABLET | Freq: Every day | ORAL | 1 refills | Status: DC
Start: 2023-02-09 — End: 2023-06-24

## 2023-02-09 MED ORDER — LAMOTRIGINE 100 MG PO TABS: 100.0000 mg | ORAL_TABLET | Freq: Two times a day (BID) | ORAL | 0 refills | Status: DC

## 2023-02-09 NOTE — Telephone Encounter (Signed)
Call to patient, Saturday patients Lamictal  is suppose to delivered but she will be out on Saturday as well. Per verbal from Dr. [name], okay to order 5 day supply in event there is delay with mail order. Patient appreciative and will let us know if there are issues with mail order delivery.

## 2023-02-09 NOTE — Telephone Encounter (Signed)
Pt has called back to report that her request for enough of the  lamoTRIgine (LAMICTAL) 100 MG tablet has not been called into the [id number].  Pt has asked for a sufficient amount of the medication to be called into [facility] until her appointment (not mail order pharmacy)

## 2023-02-09 NOTE — Assessment & Plan Note (Signed)
Most recent TSH and T4 within normal limits.  Continue levothyroxine 25 mcg daily.  Recommended starting with increased fiber intake in hopes that it helps to resolve the diarrhea.  Goal for her should be 25-30 g of fiber daily.  We discussed that this can be met through food, I provided a printed list with some high-fiber food examples.  If she finds it difficult to meet these goals, she can also had a daily Metamucil supplement.  Will continue to monitor.

## 2023-02-09 NOTE — Progress Notes (Signed)
   Established Patient Office Visit  Subjective   Patient ID: Christie Moreno, female    DOB: 03-Dec-1955  Age: 67 y.o. MRN: [id number]  Chief Complaint  Patient presents with   Hypothyroidism    HPI Christie Moreno is a 67 y.o. female presenting today for follow up of hypothyroidism.  Most recent thyroid labs within normal limits.  She continues to have loose stools about twice a day ever since starting the levothyroxine.  There has been some improvement.  She admits that she typically has her first meal of the day about 35 to 40 minutes after taking the medication, so she wonders if she is taking it too soon.  ROS Negative unless otherwise noted in HPI   Objective:     BP 118/65 (BP Location: Left Arm, Patient Position: Sitting, Cuff Size: Small)   Pulse 65   Resp 18   Ht 5' 1.5" (1.562 m)   Wt 83 lb (37.6 kg)   SpO2 99%   BMI 15.43 kg/m   Physical Exam Constitutional:      General: She is not in acute distress.    Appearance: Normal appearance.  HENT:     Head: Normocephalic and atraumatic.  Cardiovascular:     Rate and Rhythm: Normal rate and regular rhythm.     Heart sounds: No murmur heard.    No friction rub. No gallop.  Pulmonary:     Effort: Pulmonary effort is normal. No respiratory distress.     Breath sounds: No wheezing, rhonchi or rales.  Skin:    General: Skin is warm and dry.  Neurological:     Mental Status: She is alert and oriented to person, place, and time.      Assessment & Plan:  Hypothyroidism, unspecified type Assessment & Plan: Most recent TSH and T4 within normal limits.  Continue levothyroxine 25 mcg daily.  Recommended starting with increased fiber intake in hopes that it helps to resolve the diarrhea.  Goal for her should be 25-30 g of fiber daily.  We discussed that this can be met through food, I provided a printed list with some high-fiber food examples.  If she finds it difficult to meet these goals, she can also had a daily  Metamucil supplement.  Will continue to monitor.  Orders: -     Levothyroxine Sodium; Take 1 tablet (25 mcg total) by mouth daily before breakfast.  Dispense: 90 tablet; Refill: 1    Return in about 2 months (around 04/12/2023) for follow-up for thyroid, possible medication side effects, increasing fiber.    Melida Quitter, PA

## 2023-02-09 NOTE — Patient Instructions (Signed)
GOAL: 25-30 G FIBER EACH DAY  If it is difficult to reach this goal with food alone, a once a day Metamucil supplement provides a great amount of fiber as well.

## 2023-02-28 ENCOUNTER — Other Ambulatory Visit: Payer: Self-pay | Admitting: Cardiovascular Disease

## 2023-03-15 ENCOUNTER — Ambulatory Visit (HOSPITAL_COMMUNITY): Payer: Medicare Other | Attending: Cardiovascular Disease

## 2023-03-15 DIAGNOSIS — I350 Nonrheumatic aortic (valve) stenosis: Secondary | ICD-10-CM | POA: Diagnosis not present

## 2023-03-15 DIAGNOSIS — I214 Non-ST elevation (NSTEMI) myocardial infarction: Secondary | ICD-10-CM | POA: Diagnosis not present

## 2023-03-15 DIAGNOSIS — I5021 Acute systolic (congestive) heart failure: Secondary | ICD-10-CM | POA: Insufficient documentation

## 2023-03-15 LAB — ECHOCARDIOGRAM COMPLETE
AR max vel: 1.15 cm2
AV Area VTI: 1.27 cm2
AV Area mean vel: 1.25 cm2
AV Mean grad: 9 mmHg
AV Peak grad: 17.8 mmHg
Ao pk vel: 2.11 m/s
Area-P 1/2: 3.1 cm2
MV M vel: 4.63 m/s
MV Peak grad: 85.7 mmHg
P 1/2 time: 764 msec
Radius: 0.45 cm
S' Lateral: 1.7 cm

## 2023-03-17 ENCOUNTER — Ambulatory Visit (INDEPENDENT_AMBULATORY_CARE_PROVIDER_SITE_OTHER): Payer: Medicare Other

## 2023-03-17 VITALS — Ht 61.5 in | Wt 81.0 lb

## 2023-03-17 DIAGNOSIS — Z0000 Encounter for general adult medical examination without abnormal findings: Secondary | ICD-10-CM

## 2023-03-17 DIAGNOSIS — Z122 Encounter for screening for malignant neoplasm of respiratory organs: Secondary | ICD-10-CM

## 2023-03-17 NOTE — Patient Instructions (Addendum)
Christie Moreno , Thank you for taking time to come for your Medicare Wellness Visit. I appreciate your ongoing commitment to your health goals. Please review the following plan we discussed and let me know if I can assist you in the future.   Referrals/Orders/Follow-Ups/Clinician Recommendations:   This is a list of the screening recommended for you and due dates:  Health Maintenance  Topic Date Due   Pneumonia Vaccine (1 of 2 - PCV) Never done   DTaP/Tdap/Td vaccine (1 - Tdap) Never done   Zoster (Shingles) Vaccine (1 of 2) Never done   COVID-19 Vaccine (3 - 2023-24 season) 04/02/2022   Screening for Lung Cancer  11/03/2022   Flu Shot  03/03/2023   Mammogram  11/26/2023   Medicare Annual Wellness Visit  03/16/2024   Colon Cancer Screening  07/20/2027   DEXA scan (bone density measurement)  Completed   Hepatitis C Screening  Completed   HPV Vaccine  Aged Out    Advanced directives: (Declined) Advance directive discussed with you today. Even though you declined this today, please call our office should you change your mind, and we can give you the proper paperwork for you to fill out.  Next Medicare Annual Wellness Visit scheduled for next year: Yes  Preventive Care 59 Years and Older, Female Preventive care refers to lifestyle choices and visits with your health care provider that can promote health and wellness. What does preventive care include? A yearly physical exam. This is also called an annual well check. Dental exams once or twice a year. Routine eye exams. Ask your health care provider how often you should have your eyes checked. Personal lifestyle choices, including: Daily care of your teeth and gums. Regular physical activity. Eating a healthy diet. Avoiding tobacco and drug use. Limiting alcohol use. Practicing safe sex. Taking low-dose aspirin every day. Taking vitamin and mineral supplements as recommended by your health care provider. What happens during an  annual well check? The services and screenings done by your health care provider during your annual well check will depend on your age, overall health, lifestyle risk factors, and family history of disease. Counseling  Your health care provider may ask you questions about your: Alcohol use. Tobacco use. Drug use. Emotional well-being. Home and relationship well-being. Sexual activity. Eating habits. History of falls. Memory and ability to understand (cognition). Work and work Astronomer. Reproductive health. Screening  You may have the following tests or measurements: Height, weight, and BMI. Blood pressure. Lipid and cholesterol levels. These may be checked every 5 years, or more frequently if you are over 85 years old. Skin check. Lung cancer screening. You may have this screening every year starting at age 37 if you have a 30-pack-year history of smoking and currently smoke or have quit within the past 15 years. Fecal occult blood test (FOBT) of the stool. You may have this test every year starting at age 65. Flexible sigmoidoscopy or colonoscopy. You may have a sigmoidoscopy every 5 years or a colonoscopy every 10 years starting at age 80. Hepatitis C blood test. Hepatitis B blood test. Sexually transmitted disease (STD) testing. Diabetes screening. This is done by checking your blood sugar (glucose) after you have not eaten for a while (fasting). You may have this done every 1-3 years. Bone density scan. This is done to screen for osteoporosis. You may have this done starting at age 50. Mammogram. This may be done every 1-2 years. Talk to your health care provider about how often  you should have regular mammograms. Talk with your health care provider about your test results, treatment options, and if necessary, the need for more tests. Vaccines  Your health care provider may recommend certain vaccines, such as: Influenza vaccine. This is recommended every year. Tetanus,  diphtheria, and acellular pertussis (Tdap, Td) vaccine. You may need a Td booster every 10 years. Zoster vaccine. You may need this after age 66. Pneumococcal 13-valent conjugate (PCV13) vaccine. One dose is recommended after age 38. Pneumococcal polysaccharide (PPSV23) vaccine. One dose is recommended after age 80. Talk to your health care provider about which screenings and vaccines you need and how often you need them. This information is not intended to replace advice given to you by your health care provider. Make sure you discuss any questions you have with your health care provider. Document Released: 08/15/2015 Document Revised: 04/07/2016 Document Reviewed: 05/20/2015 Elsevier Interactive Patient Education  2017 [employer].  Fall Prevention in the Home Falls can cause injuries. They can happen to people of all ages. There are many things you can do to make your home safe and to help prevent falls. What can I do on the outside of my home? Regularly fix the edges of walkways and driveways and fix any cracks. Remove anything that might make you trip as you walk through a door, such as a raised step or threshold. Trim any bushes or trees on the path to your home. Use bright outdoor lighting. Clear any walking paths of anything that might make someone trip, such as rocks or tools. Regularly check to see if handrails are loose or broken. Make sure that both sides of any steps have handrails. Any raised decks and porches should have guardrails on the edges. Have any leaves, snow, or ice cleared regularly. Use sand or salt on walking paths during winter. Clean up any spills in your garage right away. This includes oil or grease spills. What can I do in the bathroom? Use night lights. Install grab bars by the toilet and in the tub and shower. Do not use towel bars as grab bars. Use non-skid mats or decals in the tub or shower. If you need to sit down in the shower, use a plastic,  non-slip stool. Keep the floor dry. Clean up any water that spills on the floor as soon as it happens. Remove soap buildup in the tub or shower regularly. Attach bath mats securely with double-sided non-slip rug tape. Do not have throw rugs and other things on the floor that can make you trip. What can I do in the bedroom? Use night lights. Make sure that you have a light by your bed that is easy to reach. Do not use any sheets or blankets that are too big for your bed. They should not hang down onto the floor. Have a firm chair that has side arms. You can use this for support while you get dressed. Do not have throw rugs and other things on the floor that can make you trip. What can I do in the kitchen? Clean up any spills right away. Avoid walking on wet floors. Keep items that you use a lot in easy-to-reach places. If you need to reach something above you, use a strong step stool that has a grab bar. Keep electrical cords out of the way. Do not use floor polish or wax that makes floors slippery. If you must use wax, use non-skid floor wax. Do not have throw rugs and other things on  the floor that can make you trip. What can I do with my stairs? Do not leave any items on the stairs. Make sure that there are handrails on both sides of the stairs and use them. Fix handrails that are broken or loose. Make sure that handrails are as long as the stairways. Check any carpeting to make sure that it is firmly attached to the stairs. Fix any carpet that is loose or worn. Avoid having throw rugs at the top or bottom of the stairs. If you do have throw rugs, attach them to the floor with carpet tape. Make sure that you have a light switch at the top of the stairs and the bottom of the stairs. If you do not have them, ask someone to add them for you. What else can I do to help prevent falls? Wear shoes that: Do not have high heels. Have rubber bottoms. Are comfortable and fit you well. Are closed  at the toe. Do not wear sandals. If you use a stepladder: Make sure that it is fully opened. Do not climb a closed stepladder. Make sure that both sides of the stepladder are locked into place. Ask someone to hold it for you, if possible. Clearly mark and make sure that you can see: Any grab bars or handrails. First and last steps. Where the edge of each step is. Use tools that help you move around (mobility aids) if they are needed. These include: Canes. Walkers. Scooters. Crutches. Turn on the lights when you go into a dark area. Replace any light bulbs as soon as they burn out. Set up your furniture so you have a clear path. Avoid moving your furniture around. If any of your floors are uneven, fix them. If there are any pets around you, be aware of where they are. Review your medicines with your doctor. Some medicines can make you feel dizzy. This can increase your chance of falling. Ask your doctor what other things that you can do to help prevent falls. This information is not intended to replace advice given to you by your health care provider. Make sure you discuss any questions you have with your health care provider. Document Released: 05/15/2009 Document Revised: 12/25/2015 Document Reviewed: 08/23/2014 Elsevier Interactive Patient Education  2017 [employer].

## 2023-03-17 NOTE — Progress Notes (Signed)
Subjective:   Christie Moreno is a 67 y.o. female who presents for Medicare Annual (Subsequent) preventive examination.  Visit Complete: Virtual  I connected with  Christie Moreno on 03/17/23 by a audio enabled telemedicine application and verified that I am speaking with the correct person using two identifiers.  Patient Location: Home  Provider Location: Home Office  I discussed the limitations of evaluation and management by telemedicine. The patient expressed understanding and agreed to proceed.   Review of Systems    Vital Signs: Unable to obtain new vitals due to this being a telehealth visit.  Cardiac Risk Factors include: advanced age (>79men, >60 women);smoking/ tobacco exposure;Other (see comment), Risk factor comments: Dx: COPD     Objective:    Today's Vitals   03/17/23 1309 03/17/23 1312  Weight: 81 lb (36.7 kg)   Height: 5' 1.5" (1.562 m)   PainSc:  0-No pain   Body mass index is 15.06 kg/m.     03/17/2023    1:24 PM 11/05/2021    7:40 PM 11/02/2021    6:44 AM  Advanced Directives  Does Patient Have a Medical Advance Directive? No  No  Would patient like information on creating a medical advance directive? No - Patient declined No - Patient declined     Current Medications (verified) Outpatient Encounter Medications as of 03/17/2023  Medication Sig   albuterol (PROVENTIL) (2.5 MG/3ML) 0.083% nebulizer solution Take 3 mLs (2.5 mg total) by nebulization every 6 (six) hours as needed for wheezing or shortness of breath.   aspirin 81 MG EC tablet Take 1 tablet (81 mg total) by mouth daily. Swallow whole.   atorvastatin (LIPITOR) 80 MG tablet TAKE 1 TABLET(80 MG) BY MOUTH DAILY AT 6 PM   carvedilol (COREG) 3.125 MG tablet TAKE 1 TABLET TWICE A DAY WITH MEALS   estrogens, conjugated, (PREMARIN) 1.25 MG tablet Take 1.25 mg by mouth daily.   feeding supplement (ENSURE ENLIVE / ENSURE PLUS) LIQD Take 237 mLs by mouth 2 (two) times daily between meals.    furosemide (LASIX) 40 MG tablet TAKE ONE-HALF (1/2) TABLET (20 MG TOTAL) DAILY (KEEP UPCOMING APPOINTMENT FOR FUTURE REFILLS)   hydrOXYzine (ATARAX/VISTARIL) 25 MG tablet Take 0.5 tablets (12.5 mg total) by mouth every 8 (eight) hours as needed for itching.   ipratropium-albuterol (DUONEB) 0.5-2.5 (3) MG/3ML SOLN Take 3 mLs by nebulization every 6 (six) hours as needed.   JARDIANCE 10 MG TABS tablet TAKE 1 TABLET DAILY   lamoTRIgine (LAMICTAL) 100 MG tablet Take 1 tablet (100 mg total) by mouth 2 (two) times daily.   levothyroxine (SYNTHROID) 25 MCG tablet Take 1 tablet (25 mcg total) by mouth daily before breakfast.   losartan (COZAAR) 50 MG tablet TAKE ONE-HALF (1/2) TABLET DAILY   potassium chloride SA (KLOR-CON M) 20 MEQ tablet TAKE 1 TABLET DAILY (KEEP UPCOMING APPOINTMENT FOR FUTURE REFILLS)   progesterone (PROMETRIUM) 200 MG capsule Take 200 mg by mouth at bedtime.   No facility-administered encounter medications on file as of 03/17/2023.    Allergies (verified) Azithromycin, Erythromycin, Percocet [oxycodone-acetaminophen], and Penicillins   History: Past Medical History:  Diagnosis Date   Heart failure (HCC)    Seizures (HCC)    none in years, recently had medication change   Past Surgical History:  Procedure Laterality Date   COLONOSCOPY     RIGHT/LEFT HEART CATH AND CORONARY ANGIOGRAPHY N/A 11/05/2021   Procedure: RIGHT/LEFT HEART CATH AND CORONARY ANGIOGRAPHY;  Surgeon: Lyn Records, MD;  Location:  [facility];  Service: Cardiovascular;  Laterality: N/A;   Family History  Adopted: Yes  Problem Relation Age of Onset   Breast cancer Mother    Diabetes Mother    Cancer Mother        breast   Stroke Mother    Heart Problems Father    Diabetes Sister    Breast cancer Maternal Aunt    Colon cancer Neg Hx    Esophageal cancer Neg Hx    Social History   Socioeconomic History   Marital status: Significant Other    Spouse name: Rexanne Mano   Number of children:  1   Years of education: Not on file   Highest education level: Bachelor's degree (e.g., BA, AB, BS)  Occupational History   Occupation: retired  Tobacco Use   Smoking status: Some Days    Current packs/day: 0.00    Average packs/day: 1 pack/day for 52.0 years (52.0 ttl pk-yrs)    Types: Cigarettes    Start date: 11/09/1969    Last attempt to quit: 11/09/2021    Years since quitting: 1.3    Passive exposure: Current   Smokeless tobacco: Never   Tobacco comments:    Currently 1 pack every 2-3 days    04/06/2022 patient states when she smokes its 1 cigarette  Vaping Use   Vaping status: Never Used  Substance and Sexual Activity   Alcohol use: Yes    Comment: socially   Drug use: No   Sexual activity: Not on file  Other Topics Concern   Not on file  Social History Narrative   Not on file   Social Determinants of Health   Financial Resource Strain: Low Risk  (03/17/2023)   Overall Financial Resource Strain (CARDIA)    Difficulty of Paying Living Expenses: Not hard at all  Food Insecurity: No Food Insecurity (03/17/2023)   Hunger Vital Sign    Worried About Running Out of Food in the Last Year: Never true    Ran Out of Food in the Last Year: Never true  Transportation Needs: No Transportation Needs (03/17/2023)   PRAPARE - Administrator, Civil Service (Medical): No    Lack of Transportation (Non-Medical): No  Physical Activity: Inactive (03/17/2023)   Exercise Vital Sign    Days of Exercise per Week: 0 days    Minutes of Exercise per Session: 0 min  Stress: Stress Concern Present (03/17/2023)   [name] - Occupational Stress Questionnaire    Feeling of Stress : To some extent  Social Connections: Socially Isolated (03/17/2023)   Social Connection and Isolation Panel [NHANES]    Frequency of Communication with Friends and Family: More than three times a week    Frequency of Social Gatherings with Friends and Family: More than three  times a week    Attends Religious Services: Never    Database administrator or Organizations: No    Attends Banker Meetings: Never    Marital Status: Widowed    Tobacco Counseling Ready to quit: No Counseling given: Yes Tobacco comments: Currently 1 pack every 2-3 days 04/06/2022 patient states when she smokes its 1 cigarette   Clinical Intake:  Pre-visit preparation completed: Yes  Pain : No/denies pain Pain Score: 0-No pain     BMI - recorded: 15.06 Nutritional Status: BMI <19  Underweight Nutritional Risks: None Diabetes: No  How often do you need to have someone help you when you read  instructions, pamphlets, or other written materials from your doctor or pharmacy?: 3 - Sometimes (Friend assist)  Interpreter Needed?: No  Information entered by :: Theresa Mulligan LPN   Activities of Daily Living    03/17/2023    1:22 PM 06/28/2022    4:02 PM  In your present state of health, do you have any difficulty performing the following activities:  Hearing? 0 0  Vision? 0 0  Difficulty concentrating or making decisions? 0 0  Walking or climbing stairs? 0 0  Dressing or bathing? 0 0  Doing errands, shopping? 0 0  Preparing Food and eating ? N   Using the Toilet? N   In the past six months, have you accidently leaked urine? N   Do you have problems with loss of bowel control? N   Managing your Medications? N   Managing your Finances? N   Housekeeping or managing your Housekeeping? N     Patient Care Team: Melida Quitter, PA as PCP - General (Family Medicine) Runell Gess, MD as PCP - Cardiology (Cardiology)  Indicate any recent Medical Services you may have received from other than [facility] providers in the past year (date may be approximate).     Assessment:   This is a routine wellness examination for Christie Moreno.  Hearing/Vision screen Hearing Screening - Comments:: Denies hearing difficulties   Vision Screening - Comments:: Wears rx glasses  -Not up to date with routine eye exams with  Deferred Pending Appt.  Dietary issues and exercise activities discussed:     Goals Addressed               This Visit's Progress     Increase physical activity (pt-stated)        Curve smoking habit.       Depression Screen    03/17/2023    1:20 PM 02/09/2023   10:50 AM 12/29/2022   11:24 AM 11/24/2022    3:15 PM 06/28/2022    4:02 PM 02/22/2022    1:48 PM 11/23/2021   11:17 AM  PHQ 2/9 Scores  PHQ - 2 Score 2 2 0 1 2 1 2   PHQ- 9 Score 2 4 1 5 4 3 4     Fall Risk    03/17/2023    1:22 PM 12/29/2022   11:30 AM 06/28/2022    4:02 PM 11/23/2021   11:18 AM  Fall Risk   Falls in the past year? 1 0 1 1  Number falls in past yr: 0 0 1 0  Injury with Fall? 0 0 0 0  Risk for fall due to : No Fall Risks No Fall Risks  Impaired balance/gait;Impaired mobility  Follow up Falls prevention discussed Falls evaluation completed  Falls evaluation completed    MEDICARE RISK AT HOME:  Medicare Risk at Home - 03/17/23 1330     Any stairs in or around the home? Yes    If so, are there any without handrails? No    Home free of loose throw rugs in walkways, pet beds, electrical cords, etc? Yes    Adequate lighting in your home to reduce risk of falls? Yes    Life alert? No    Use of a cane, walker or w/c? No    Grab bars in the bathroom? No    Shower chair or bench in shower? No    Elevated toilet seat or a handicapped toilet? No  TIMED UP AND GO:  Was the test performed?  No    Cognitive Function:    02/03/2021    2:48 PM  MMSE - Mini Mental State Exam  Orientation to time 5  Orientation to Place 5  Registration 3  Attention/ Calculation 0  Recall 3  Language- name 2 objects 2  Language- repeat 1  Language- follow 3 step command 3  Language- read & follow direction 1  Write a sentence 1  Copy design 1  Total score 25        03/17/2023    1:24 PM 02/22/2022    1:51 PM  6CIT Screen  What Year? 0 points 0  points  What month? 0 points 0 points  What time? 0 points 0 points  Count back from 20 0 points 0 points  Months in reverse 4 points 0 points  Repeat phrase 6 points 2 points  Total Score 10 points 2 points    Immunizations Immunization History  Administered Date(s) Administered   Fluad Quad(high Dose 65+) 06/28/2022   PFIZER(Purple Top)SARS-COV-2 Vaccination 12/30/2019, 01/20/2020    TDAP status: Due, Education has been provided regarding the importance of this vaccine. Advised may receive this vaccine at local pharmacy or Health Dept. Aware to provide a copy of the vaccination record if obtained from local pharmacy or Health Dept. Verbalized acceptance and understanding.  Flu Vaccine status: Due, Education has been provided regarding the importance of this vaccine. Advised may receive this vaccine at local pharmacy or Health Dept. Aware to provide a copy of the vaccination record if obtained from local pharmacy or Health Dept. Verbalized acceptance and understanding.  Pneumococcal vaccine status: Due, Education has been provided regarding the importance of this vaccine. Advised may receive this vaccine at local pharmacy or Health Dept. Aware to provide a copy of the vaccination record if obtained from local pharmacy or Health Dept. Verbalized acceptance and understanding.  Covid-19 vaccine status: Declined, Education has been provided regarding the importance of this vaccine but patient still declined. Advised may receive this vaccine at local pharmacy or Health Dept.or vaccine clinic. Aware to provide a copy of the vaccination record if obtained from local pharmacy or Health Dept. Verbalized acceptance and understanding.  Qualifies for Shingles Vaccine? Yes   Zostavax completed No   Shingrix Completed?: No.    Education has been provided regarding the importance of this vaccine. Patient has been advised to call insurance company to determine out of pocket expense if they have not yet  received this vaccine. Advised may also receive vaccine at local pharmacy or Health Dept. Verbalized acceptance and understanding.  Screening Tests Health Maintenance  Topic Date Due   Pneumonia Vaccine 57+ Years old (1 of 2 - PCV) Never done   DTaP/Tdap/Td (1 - Tdap) Never done   Zoster Vaccines- Shingrix (1 of 2) Never done   COVID-19 Vaccine (3 - 2023-24 season) 04/02/2022   Lung Cancer Screening  11/03/2022   INFLUENZA VACCINE  03/03/2023   MAMMOGRAM  11/26/2023   Medicare Annual Wellness (AWV)  03/16/2024   Colonoscopy  07/20/2027   DEXA SCAN  Completed   Hepatitis C Screening  Completed   HPV VACCINES  Aged Out    Health Maintenance  Health Maintenance Due  Topic Date Due   Pneumonia Vaccine 81+ Years old (1 of 2 - PCV) Never done   DTaP/Tdap/Td (1 - Tdap) Never done   Zoster Vaccines- Shingrix (1 of 2) Never done   COVID-19 Vaccine (  3 - 2023-24 season) 04/02/2022   Lung Cancer Screening  11/03/2022   INFLUENZA VACCINE  03/03/2023    Colorectal cancer screening: Type of screening: Colonoscopy. Completed 07/19/22. Repeat every 5 years  Mammogram status: Completed 11/25/21. Repeat every year  Bone Density status: Completed 09/07/21. Results reflect: Bone density results: OSTEOPOROSIS. Repeat every   years.  Lung Cancer Screening: (Low Dose CT Chest recommended if Age 65-80 years, 20 pack-year currently smoking OR have quit w/in 15years.) does qualify.   Lung Cancer Screening Referral: 03/07/23  Additional Screening:  Hepatitis C Screening: does qualify; Completed 11/04/21  Vision Screening: Recommended annual ophthalmology exams for early detection of glaucoma and other disorders of the eye. Is the patient up to date with their annual eye exam?  No Who is the provider or what is the name of the office in which the patient attends annual eye exams? Deferred If pt is not established with a provider, would they like to be referred to a provider to establish care? No .    Dental Screening: Recommended annual dental exams for proper oral hygiene  Community Resource Referral / Chronic Care Management:  CRR required this visit?  No   CCM required this visit?  No     Plan:     I have personally reviewed and noted the following in the patient's chart:   Medical and social history Use of alcohol, tobacco or illicit drugs  Current medications and supplements including opioid prescriptions. Patient is not currently taking opioid prescriptions. Functional ability and status Nutritional status Physical activity Advanced directives List of other physicians Hospitalizations, surgeries, and ER visits in previous 12 months Vitals Screenings to include cognitive, depression, and falls Referrals and appointments  In addition, I have reviewed and discussed with patient certain preventive protocols, quality metrics, and best practice recommendations. A written personalized care plan for preventive services as well as general preventive health recommendations were provided to patient.     Tillie Rung, LPN   09/15/863   After Visit Summary: (MyChart) Due to this being a telephonic visit, the after visit summary with patients personalized plan was offered to patient via MyChart   Nurse Notes: None

## 2023-04-05 ENCOUNTER — Ambulatory Visit: Payer: Medicare Other | Attending: Cardiovascular Disease | Admitting: Cardiovascular Disease

## 2023-04-05 ENCOUNTER — Encounter: Payer: Self-pay | Admitting: Cardiovascular Disease

## 2023-04-05 ENCOUNTER — Telehealth: Payer: Self-pay | Admitting: Cardiovascular Disease

## 2023-04-05 VITALS — BP 136/90 | HR 60 | Ht 61.5 in | Wt 79.2 lb

## 2023-04-05 DIAGNOSIS — E782 Mixed hyperlipidemia: Secondary | ICD-10-CM | POA: Diagnosis present

## 2023-04-05 DIAGNOSIS — I350 Nonrheumatic aortic (valve) stenosis: Secondary | ICD-10-CM | POA: Insufficient documentation

## 2023-04-05 DIAGNOSIS — F17200 Nicotine dependence, unspecified, uncomplicated: Secondary | ICD-10-CM | POA: Diagnosis present

## 2023-04-05 DIAGNOSIS — I214 Non-ST elevation (NSTEMI) myocardial infarction: Secondary | ICD-10-CM | POA: Diagnosis present

## 2023-04-05 DIAGNOSIS — I5021 Acute systolic (congestive) heart failure: Secondary | ICD-10-CM | POA: Insufficient documentation

## 2023-04-05 NOTE — Assessment & Plan Note (Signed)
Mild aortic stenosis by recent 2D echo performed 03/15/2023 with a mean gradient of 9 mmHg and a valve area of 1.27 cm.  This will be repeated on an annual basis.

## 2023-04-05 NOTE — Telephone Encounter (Signed)
Pt called in stating she told the nurse the wrong weight this morning at her appt. She states she told her 89 lbs but it was supposed to be 79.2 lbs.

## 2023-04-05 NOTE — Assessment & Plan Note (Signed)
History of hyperlipidemia on high-dose statin therapy with lipid profile performed 06/18/2022 revealed a total cholesterol 167, LDL 57 and HDL of 98.

## 2023-04-05 NOTE — Telephone Encounter (Signed)
Documentation from today's visit has been modified. Chart reflects that pt weighs 79.2lbs.

## 2023-04-05 NOTE — Assessment & Plan Note (Signed)
Ongoing tobacco use of 2 cigarettes a day.  We talked about the importance of smoking cessation.

## 2023-04-05 NOTE — Telephone Encounter (Signed)
Patient states she told you guys the wrong weight. She states she informed you she weighs 89 lbs but she weigh 79.2 lbs.

## 2023-04-05 NOTE — Patient Instructions (Signed)
Medication Instructions:  Your physician recommends that you continue on your current medications as directed. Please refer to the Current Medication list given to you today.  *If you need a refill on your cardiac medications before your next appointment, please call your pharmacy*   Follow-Up: At [facility], you and your health needs are our priority.  As part of our continuing mission to provide you with exceptional heart care, we have created designated Provider Care Teams.  These Care Teams include your primary Cardiologist (physician) and Advanced Practice Providers (APPs -  Physician Assistants and Nurse Practitioners) who all work together to provide you with the care you need, when you need it.  We recommend signing up for the patient portal called "MyChart".  Sign up information is provided on this After Visit Summary.  MyChart is used to connect with patients for Virtual Visits (Telemedicine).  Patients are able to view lab/test results, encounter notes, upcoming appointments, etc.  Non-urgent messages can be sent to your provider as well.   To learn more about what you can do with MyChart, go to [email or web address].    Your next appointment:   6 month(s)  Provider:   Diona Browner, NP      Then, Quay Burow, MD will plan to see you again in 12 month(s).

## 2023-04-05 NOTE — Addendum Note (Signed)
Addended by: Bernita Buffy on: 04/05/2023 10:03 AM   Modules accepted: Orders

## 2023-04-05 NOTE — Progress Notes (Signed)
04/05/2023 Christie Moreno   May 06, 1956  [id number]  Primary Physician Melida Quitter, PA Primary Cardiologist: Runell Gess MD Milagros Loll, [place], [place]  HPI:  Christie Moreno is a 67 y.o.   thin-appearing single Caucasian female mother of 1 daughter, grandmother of 4 grandchildren who was hospitalized in early April with systolic heart failure.  I last saw her in the office 12/19/2021.  She did have a non-STEMI with troponins that rose to 1294.  She has a history of discontinue tobacco abuse.  Right and left heart cath revealed no significant obstructive disease by Dr. Katrinka Blazing.  Her EF was 40 to 45% with diastolic dysfunction.  She was diuresed and discharged home.  She has seen the advanced heart failure service on outpatient basis.  Her medications have been optimized.   Since I saw her in the office a year ago she continues to do well.  She denies chest pain or shortness of breath.  She did have a recent 2D echo performed 03/15/2023 revealing normal LV systolic function, grade 1 diastolic dysfunction, mild to moderate MR and mild aortic stenosis with a mean gradient of 9 mmHg and a valve area 1.27 cm.  Current Meds  Medication Sig   albuterol (PROVENTIL) (2.5 MG/3ML) 0.083% nebulizer solution Take 3 mLs (2.5 mg total) by nebulization every 6 (six) hours as needed for wheezing or shortness of breath.   aspirin 81 MG EC tablet Take 1 tablet (81 mg total) by mouth daily. Swallow whole.   atorvastatin (LIPITOR) 80 MG tablet TAKE 1 TABLET(80 MG) BY MOUTH DAILY AT 6 PM   carvedilol (COREG) 3.125 MG tablet TAKE 1 TABLET TWICE A DAY WITH MEALS   estrogens, conjugated, (PREMARIN) 1.25 MG tablet Take 1.25 mg by mouth daily.   feeding supplement (ENSURE ENLIVE / ENSURE PLUS) LIQD Take 237 mLs by mouth 2 (two) times daily between meals.   furosemide (LASIX) 40 MG tablet TAKE ONE-HALF (1/2) TABLET (20 MG TOTAL) DAILY (KEEP UPCOMING APPOINTMENT FOR FUTURE REFILLS)   ipratropium-albuterol  (DUONEB) 0.5-2.5 (3) MG/3ML SOLN Take 3 mLs by nebulization every 6 (six) hours as needed.   JARDIANCE 10 MG TABS tablet TAKE 1 TABLET DAILY   lamoTRIgine (LAMICTAL) 100 MG tablet Take 1 tablet (100 mg total) by mouth 2 (two) times daily.   levothyroxine (SYNTHROID) 25 MCG tablet Take 1 tablet (25 mcg total) by mouth daily before breakfast.   losartan (COZAAR) 50 MG tablet TAKE ONE-HALF (1/2) TABLET DAILY   potassium chloride SA (KLOR-CON M) 20 MEQ tablet TAKE 1 TABLET DAILY (KEEP UPCOMING APPOINTMENT FOR FUTURE REFILLS)   progesterone (PROMETRIUM) 200 MG capsule Take 200 mg by mouth at bedtime.     Allergies  Allergen Reactions   Azithromycin Nausea And Vomiting   Erythromycin     Sensitivity (GI)   Percocet [Oxycodone-Acetaminophen]    Penicillins Itching and Rash    Social History   Socioeconomic History   Marital status: Significant Other    Spouse name: Rexanne Mano   Number of children: 1   Years of education: Not on file   Highest education level: Bachelor's degree (e.g., BA, AB, BS)  Occupational History   Occupation: retired  Tobacco Use   Smoking status: Some Days    Current packs/day: 0.00    Average packs/day: 1 pack/day for 52.0 years (52.0 ttl pk-yrs)    Types: Cigarettes    Start date: 11/09/1969    Last attempt to quit: 11/09/2021  Years since quitting: 1.4    Passive exposure: Current   Smokeless tobacco: Never   Tobacco comments:    Currently 1 pack every 2-3 days    04/06/2022 patient states when she smokes its 1 cigarette    04/05/2023 Patient smokes 2 cigarettes some days   Vaping Use   Vaping status: Never Used  Substance and Sexual Activity   Alcohol use: Yes    Comment: socially   Drug use: No   Sexual activity: Not on file  Other Topics Concern   Not on file  Social History Narrative   Not on file   Social Determinants of Health   Financial Resource Strain: Low Risk  (03/17/2023)   Overall Financial Resource Strain (CARDIA)    Difficulty  of Paying Living Expenses: Not hard at all  Food Insecurity: No Food Insecurity (03/17/2023)   Hunger Vital Sign    Worried About Running Out of Food in the Last Year: Never true    Ran Out of Food in the Last Year: Never true  Transportation Needs: No Transportation Needs (03/17/2023)   PRAPARE - Administrator, Civil Service (Medical): No    Lack of Transportation (Non-Medical): No  Physical Activity: Inactive (03/17/2023)   Exercise Vital Sign    Days of Exercise per Week: 0 days    Minutes of Exercise per Session: 0 min  Stress: Stress Concern Present (03/17/2023)   [name] - Occupational Stress Questionnaire    Feeling of Stress : To some extent  Social Connections: Socially Isolated (03/17/2023)   Social Connection and Isolation Panel [NHANES]    Frequency of Communication with Friends and Family: More than three times a week    Frequency of Social Gatherings with Friends and Family: More than three times a week    Attends Religious Services: Never    Database administrator or Organizations: No    Attends Banker Meetings: Never    Marital Status: Widowed  Intimate Partner Violence: Not At Risk (03/17/2023)   Humiliation, Afraid, Rape, and Kick questionnaire    Fear of Current or Ex-Partner: No    Emotionally Abused: No    Physically Abused: No    Sexually Abused: No     Review of Systems: General: negative for chills, fever, night sweats or weight changes.  Cardiovascular: negative for chest pain, dyspnea on exertion, edema, orthopnea, palpitations, paroxysmal nocturnal dyspnea or shortness of breath Dermatological: negative for rash Respiratory: negative for cough or wheezing Urologic: negative for hematuria Abdominal: negative for nausea, vomiting, diarrhea, bright red blood per rectum, melena, or hematemesis Neurologic: negative for visual changes, syncope, or dizziness All other systems reviewed and are otherwise  negative except as noted above.    Blood pressure (!) 136/90, pulse 60, height 5' 1.5" (1.562 m), weight 89 lb 3.2 oz (40.5 kg), SpO2 99%.  General appearance: alert and no distress Neck: no adenopathy, no JVD, supple, symmetrical, trachea midline, thyroid not enlarged, symmetric, no tenderness/mass/nodules, and bilateral carotid bruits versus transmitted murmur Lungs: clear to auscultation bilaterally Heart: 2/6 outflow tract murmur consistent with aortic stenosis. Extremities: extremities normal, atraumatic, no cyanosis or edema Pulses: 2+ and symmetric Skin: Skin color, texture, turgor normal. No rashes or lesions Neurologic: Grossly normal  EKG EKG Interpretation Date/Time:  Tuesday April 05 2023 09:40:22 EDT Ventricular Rate:  60 PR Interval:  172 QRS Duration:  86 QT Interval:  418 QTC Calculation: 418 R Axis:   106  Text Interpretation: Normal sinus rhythm Rightward axis When compared with ECG of 11-Nov-2021 14:28, T wave inversion no longer evident in Lateral leads Confirmed by Nanetta Batty [phone]) on 04/05/2023 9:45:31 AM    ASSESSMENT AND PLAN:   Tobacco dependence Ongoing tobacco use of 2 cigarettes a day.  We talked about the importance of smoking cessation.  NSTEMI (non-ST elevated myocardial infarction) (HCC) History of non-STEMI with a troponin that rose to 1294.  She had a right left heart cath performed by Dr. Katrinka Blazing 11/05/21 that showed nonobstructive disease with a 75% mid AV groove circumflex stenosis and a 75% ostial first diagonal branch stenosis.  She is otherwise asymptomatic.  Aortic stenosis Mild aortic stenosis by recent 2D echo performed 03/15/2023 with a mean gradient of 9 mmHg and a valve area of 1.27 cm.  This will be repeated on an annual basis.  Hyperlipidemia History of hyperlipidemia on high-dose statin therapy with lipid profile performed 06/18/2022 revealed a total cholesterol 167, LDL 57 and HDL of 98.     Runell Gess MD  FACP,FACC,FAHA, [facility] 04/05/2023 9:56 AM

## 2023-04-05 NOTE — Assessment & Plan Note (Signed)
History of non-STEMI with a troponin that rose to 1294.  She had a right left heart cath performed by Dr. Katrinka Blazing 11/05/21 that showed nonobstructive disease with a 75% mid AV groove circumflex stenosis and a 75% ostial first diagonal branch stenosis.  She is otherwise asymptomatic.

## 2023-04-13 ENCOUNTER — Ambulatory Visit (INDEPENDENT_AMBULATORY_CARE_PROVIDER_SITE_OTHER): Payer: Medicare Other | Admitting: Family Medicine

## 2023-04-13 ENCOUNTER — Encounter: Payer: Self-pay | Admitting: Family Medicine

## 2023-04-13 VITALS — BP 127/66 | HR 71 | Resp 18 | Ht 61.5 in | Wt 80.0 lb

## 2023-04-13 DIAGNOSIS — Z1321 Encounter for screening for nutritional disorder: Secondary | ICD-10-CM

## 2023-04-13 DIAGNOSIS — Z23 Encounter for immunization: Secondary | ICD-10-CM

## 2023-04-13 DIAGNOSIS — R636 Underweight: Secondary | ICD-10-CM

## 2023-04-13 DIAGNOSIS — E039 Hypothyroidism, unspecified: Secondary | ICD-10-CM

## 2023-04-13 DIAGNOSIS — K279 Peptic ulcer, site unspecified, unspecified as acute or chronic, without hemorrhage or perforation: Secondary | ICD-10-CM

## 2023-04-13 DIAGNOSIS — K219 Gastro-esophageal reflux disease without esophagitis: Secondary | ICD-10-CM

## 2023-04-13 DIAGNOSIS — F439 Reaction to severe stress, unspecified: Secondary | ICD-10-CM

## 2023-04-13 DIAGNOSIS — Z131 Encounter for screening for diabetes mellitus: Secondary | ICD-10-CM

## 2023-04-13 DIAGNOSIS — E782 Mixed hyperlipidemia: Secondary | ICD-10-CM

## 2023-04-13 MED ORDER — OMEPRAZOLE 20 MG PO CPDR
20.0000 mg | DELAYED_RELEASE_CAPSULE | Freq: Every day | ORAL | 0 refills | Status: DC
Start: 2023-04-13 — End: 2023-07-19

## 2023-04-13 NOTE — Patient Instructions (Signed)
AT THE PHARMACY: -PCV20 pneumonia -Shingles  -Tdap tetanus booster

## 2023-04-13 NOTE — Progress Notes (Unsigned)
Patient: Christie Moreno Date of Birth: 1955-10-15  Reason for Visit: Follow up History from: Patient Primary Neurologist: [name]  ASSESSMENT AND PLAN 67 y.o. 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 had a history of chronic headaches, increased frequency over the past couple years, daily basis, vortex area moderate to severe pressure headaches, occasionally of his visual change      Over past 6 months, she had frequent confusion spells, it can happen up to 5 episode in a day, she was eating, suddenly had a glaring stare in her eyes, unresponsive, stirring her soup with her fingers, had violent cough, came around in few minutes.   There was also multiple episode during sleep of making sucking noise, unpurposeful hand movement, confused unresponsive lasting few minutes,   She denies significant visual change, no lateralized motor or sensory deficit,   She presented to the emergency room June 14 2012 following one episode, MRI of the brain with and without contrast showed few scattered deep subcortical white matter disease, no contrast enhancement most consistent with small vessel disease.     UPDATE Sept 21 2020: She complains of occasionally recurrent confusion spells, often preceded by strong sense of strange smell, then staring spells, lasting for few seconds, there was no generalized tonic-clonic activity, despite taking Topamax 50 mg 3 tablets twice a day, it happened about couple times each month,   Previous EEG was abnormal, there was evidence of generalized epileptiform discharges.   Update July 03, 2019 SS: After last visit her Topamax was increased to 200 mg twice a day, laboratory evaluation indicated mild elevated TSH, EEG 05/14/2019 was normal, Topamax level was therapeutic.   With the increase in Topamax, she denies further episodes of strong smells.  She reports  she has continued to have a few staring episodes, feeling she cannot focus, she does not lose consciousness, she is aware the spells are happening.  She indicates the spells have been occurring on and off for years.  She has not had seizures where she loses consciousness since 2013.  She has not had her thyroid evaluated yet.  She continues to smoke. She thinks the higher dose of Topamax makes her feel fatigued.     Update August 10, 2021 She has been treated with Topamax up to 200 mg twice a day, continues to smoke at least a pack a day, continue complains of intermittent spell of smell something funny,   She recently attributed that it is due to her husband's heart attack and his medication changes, but ever since her husband's illness, significant medication changes, and constant strong smell around him, she no longer have recurrent episodic strange smell  Denied loss of consciousness, generalized seizure episode  She continue to lose weight, less than 100 pounds today, decreased appetite, does complains of depression anxiety  Update April 14, 2023 SS: In January 2023 Dr. [name] switched from Topamax to Lamictal 100 mg twice daily.  EEG was normal.  REVIEW OF SYSTEMS: Out of a complete 14 system review of symptoms, the patient complains only of the following symptoms, and all other reviewed systems are negative.  See HPI  ALLERGIES: Allergies  Allergen Reactions   Azithromycin Nausea And Vomiting   Erythromycin     Sensitivity (GI)   Percocet [Oxycodone-Acetaminophen]    Penicillins Itching and Rash    HOME MEDICATIONS: Outpatient Medications Prior to Visit  Medication Sig Dispense Refill   albuterol (PROVENTIL) (2.5 MG/3ML) 0.083% nebulizer solution Take 3 mLs (2.5 mg total) by nebulization every 6 (six) hours as needed for wheezing or shortness of breath. 90 mL 12   aspirin 81 MG EC tablet Take 1 tablet (81 mg total) by mouth daily. Swallow whole. 30 tablet 0   atorvastatin  (LIPITOR) 80 MG tablet TAKE 1 TABLET(80 MG) BY MOUTH DAILY AT 6 PM 90 tablet 3   carvedilol (COREG) 3.125 MG tablet TAKE 1 TABLET TWICE A DAY WITH MEALS 180 tablet 3   estrogens, conjugated, (PREMARIN) 1.25 MG tablet Take 1.25 mg by mouth daily.     feeding supplement (ENSURE ENLIVE / ENSURE PLUS) LIQD Take 237 mLs by mouth 2 (two) times daily between meals. 237 mL 0   furosemide (LASIX) 40 MG tablet TAKE ONE-HALF (1/2) TABLET (20 MG TOTAL) DAILY (KEEP UPCOMING APPOINTMENT FOR FUTURE REFILLS) 30 tablet 5   hydrOXYzine (ATARAX/VISTARIL) 25 MG tablet Take 0.5 tablets (12.5 mg total) by mouth every 8 (eight) hours as needed for itching. (Patient not taking: Reported on 04/05/2023) 30 tablet 0   ipratropium-albuterol (DUONEB) 0.5-2.5 (3) MG/3ML SOLN Take 3 mLs by nebulization every 6 (six) hours as needed. 360 mL 0   JARDIANCE 10 MG TABS tablet TAKE 1 TABLET DAILY 90 tablet 3   lamoTRIgine (LAMICTAL) 100 MG tablet Take 1 tablet (100 mg total) by mouth 2 (two) times daily. 10 tablet 0   levothyroxine (SYNTHROID) 25 MCG tablet Take 1 tablet (25 mcg total) by mouth daily before breakfast. 90 tablet 1   losartan (COZAAR) 50 MG tablet TAKE ONE-HALF (1/2) TABLET DAILY 90 tablet 3   potassium chloride SA (KLOR-CON M) 20 MEQ tablet TAKE 1 TABLET DAILY (KEEP UPCOMING APPOINTMENT FOR FUTURE REFILLS) 90 tablet 3   progesterone (PROMETRIUM) 200 MG capsule Take 200 mg by mouth at bedtime.     No facility-administered medications prior to visit.    PAST MEDICAL HISTORY: Past Medical History:  Diagnosis Date   Heart failure (HCC)    Seizures (HCC)    none in years, recently had medication change    PAST SURGICAL HISTORY: Past Surgical History:  Procedure Laterality Date   COLONOSCOPY     RIGHT/LEFT HEART CATH AND CORONARY ANGIOGRAPHY N/A 11/05/2021   Procedure: RIGHT/LEFT HEART CATH AND CORONARY ANGIOGRAPHY;  Surgeon: Lyn Records, MD;  Location: [facility];  Service: Cardiovascular;  Laterality:  N/A;    FAMILY HISTORY: Family History  Adopted: Yes  Problem Relation Age of Onset   Breast cancer Mother    Diabetes Mother    Cancer Mother        breast   Stroke Mother    Heart Problems Father    Diabetes Sister    Breast cancer Maternal Aunt    Colon cancer Neg Hx    Esophageal cancer Neg Hx     SOCIAL HISTORY: Social History   Socioeconomic History   Marital status: Significant Other    Spouse name: Rexanne Mano   Number of children: 1   Years of education: Not on file   Highest education level: Bachelor's degree (e.g., BA, AB, BS)  Occupational History   Occupation: retired  Tobacco Use   Smoking status: Some Days    Current packs/day: 0.00    Average packs/day: 1 pack/day for 52.0 years (52.0 ttl pk-yrs)    Types: Cigarettes    Start date: 11/09/1969    Last attempt to quit: 11/09/2021  Years since quitting: 1.4    Passive exposure: Current   Smokeless tobacco: Never   Tobacco comments:    Currently 1 pack every 2-3 days    04/06/2022 patient states when she smokes its 1 cigarette    04/05/2023 Patient smokes 2 cigarettes some days   Vaping Use   Vaping status: Never Used  Substance and Sexual Activity   Alcohol use: Yes    Comment: socially   Drug use: No   Sexual activity: Not on file  Other Topics Concern   Not on file  Social History Narrative   Not on file   Social Determinants of Health   Financial Resource Strain: Low Risk  (03/17/2023)   Overall Financial Resource Strain (CARDIA)    Difficulty of Paying Living Expenses: Not hard at all  Food Insecurity: No Food Insecurity (03/17/2023)   Hunger Vital Sign    Worried About Running Out of Food in the Last Year: Never true    Ran Out of Food in the Last Year: Never true  Transportation Needs: No Transportation Needs (03/17/2023)   PRAPARE - Administrator, Civil Service (Medical): No    Lack of Transportation (Non-Medical): No  Physical Activity: Inactive (03/17/2023)   Exercise  Vital Sign    Days of Exercise per Week: 0 days    Minutes of Exercise per Session: 0 min  Stress: Stress Concern Present (03/17/2023)   [name] - Occupational Stress Questionnaire    Feeling of Stress : To some extent  Social Connections: Socially Isolated (03/17/2023)   Social Connection and Isolation Panel [NHANES]    Frequency of Communication with Friends and Family: More than three times a week    Frequency of Social Gatherings with Friends and Family: More than three times a week    Attends Religious Services: Never    Database administrator or Organizations: No    Attends Banker Meetings: Never    Marital Status: Widowed  Intimate Partner Violence: Not At Risk (03/17/2023)   Humiliation, Afraid, Rape, and Kick questionnaire    Fear of Current or Ex-Partner: No    Emotionally Abused: No    Physically Abused: No    Sexually Abused: No    PHYSICAL EXAM  There were no vitals filed for this visit. There is no height or weight on file to calculate BMI.  Generalized: Well developed, in no acute distress  Neurological examination  Mentation: Alert oriented to time, place, history taking. Follows all commands speech and language fluent Cranial nerve II-XII: Pupils were equal round reactive to light. Extraocular movements were full, visual field were full on confrontational test. Facial sensation and strength were normal. Uvula tongue midline. Head turning and shoulder shrug  were normal and symmetric. Motor: The motor testing reveals 5 over 5 strength of all 4 extremities. Good symmetric motor tone is noted throughout.  Sensory: Sensory testing is intact to soft touch on all 4 extremities. No evidence of extinction is noted.  Coordination: Cerebellar testing reveals good finger-nose-finger and heel-to-shin bilaterally.  Gait and station: Gait is normal. Tandem gait is normal. Romberg is negative. No drift is seen.  Reflexes: Deep tendon  reflexes are symmetric and normal bilaterally.   DIAGNOSTIC DATA (LABS, IMAGING, TESTING) - I reviewed patient records, labs, notes, testing and imaging myself where available.  Lab Results  Component Value Date   WBC 8.7 06/18/2022   HGB 12.9 06/18/2022   HCT 39.8 06/18/2022  MCV 87 06/18/2022   PLT 184 06/18/2022      Component Value Date/Time   NA 138 06/18/2022 0905   K 4.4 06/18/2022 0905   CL 98 06/18/2022 0905   CO2 24 06/18/2022 0905   GLUCOSE 88 06/18/2022 0905   GLUCOSE 89 11/25/2021 1459   BUN 26 06/18/2022 0905   CREATININE 1.03 (H) 06/18/2022 0905   CALCIUM 9.7 06/18/2022 0905   PROT 6.7 06/18/2022 0905   ALBUMIN 4.2 06/18/2022 0905   AST 34 06/18/2022 0905   ALT 46 (H) 06/18/2022 0905   ALKPHOS 71 06/18/2022 0905   BILITOT 0.4 06/18/2022 0905   GFRNONAA 50 (L) 11/25/2021 1459   GFRAA 88 04/23/2019 1021   Lab Results  Component Value Date   CHOL 167 06/18/2022   HDL 98 06/18/2022   LDLCALC 57 06/18/2022   TRIG 62 06/18/2022   CHOLHDL 1.7 06/18/2022   Lab Results  Component Value Date   HGBA1C 5.7 (H) 11/02/2021   No results found for: "VITAMINB12" Lab Results  Component Value Date   TSH 3.500 02/02/2023    Margie Ege, AGNP-C, DNP 04/13/2023, 12:49 PM [facility] [address], [address] [place], [place] [address] [phone]

## 2023-04-13 NOTE — Progress Notes (Signed)
Established Patient Office Visit  Subjective   Patient ID: Christie Moreno, female    DOB: 1955/09/04  Age: 67 y.o. MRN: [id number]  Chief Complaint  Patient presents with   Hypothyroidism    HPI Christie Moreno is a 67 y.o. female presenting today for follow up of hypothyroidism.  At last appointment, complained of loose stools about twice a day after starting levothyroxine.  Goal was to increase fiber intake aiming for 25 g of fiber daily.  She admits that she has not met her fiber goals, but she is still experiencing high stress at home due to finances with her husband not working.  She is managing all bills on her own.  She notes that she has been experiencing GI issues like loose stools and epigastric pain even before starting levothyroxine, but it has never been worked up further.  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   Tobacco dependence 11/02/2021   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   Anxiety 08/10/2021   Partial symptomatic epilepsy with complex partial seizures, not intractable, without status epilepticus (HCC) 04/23/2019   Complex partial seizure (HCC) 04/22/2015   Convulsions (HCC) 04/19/2013    Past Surgical History:  Procedure Laterality Date   COLONOSCOPY     RIGHT/LEFT HEART CATH AND CORONARY ANGIOGRAPHY N/A 11/05/2021   Procedure: RIGHT/LEFT HEART CATH AND CORONARY ANGIOGRAPHY;  Surgeon: Lyn Records, MD;  Location: [facility];  Service: Cardiovascular;  Laterality: N/A;   Family History  Adopted: Yes  Problem Relation Age of Onset   Breast cancer Mother    Diabetes Mother    Cancer Mother         breast   Stroke Mother    Heart Problems Father    Diabetes Sister    Breast cancer Maternal Aunt    Colon cancer Neg Hx    Esophageal cancer Neg Hx    Allergies  Allergen Reactions   Azithromycin Nausea And Vomiting   Erythromycin     Sensitivity (GI)   Percocet [Oxycodone-Acetaminophen]    Penicillins Itching and Rash   Outpatient Medications Prior to Visit  Medication Sig   albuterol (PROVENTIL) (2.5 MG/3ML) 0.083% nebulizer solution Take 3 mLs (2.5 mg total) by nebulization every 6 (six) hours as needed for wheezing or shortness of breath.   aspirin 81 MG EC tablet Take 1 tablet (81 mg total) by mouth daily. Swallow whole.   atorvastatin (LIPITOR) 80 MG tablet TAKE 1 TABLET(80 MG) BY MOUTH DAILY AT 6 PM   carvedilol (COREG) 3.125 MG tablet TAKE 1 TABLET TWICE A DAY WITH MEALS   estrogens, conjugated, (PREMARIN) 1.25 MG tablet Take 1.25 mg by mouth daily.   feeding supplement (ENSURE ENLIVE / ENSURE PLUS) LIQD Take 237 mLs by mouth 2 (two) times daily between meals.   furosemide (LASIX) 40 MG tablet TAKE ONE-HALF (1/2) TABLET (20 MG TOTAL) DAILY (KEEP UPCOMING APPOINTMENT FOR FUTURE REFILLS)   ipratropium-albuterol (DUONEB) 0.5-2.5 (3) MG/3ML SOLN Take 3 mLs by nebulization every 6 (six) hours as needed.   JARDIANCE 10 MG TABS tablet TAKE 1 TABLET DAILY   lamoTRIgine (LAMICTAL) 100 MG tablet Take 1 tablet (100 mg total) by mouth 2 (  two) times daily.   levothyroxine (SYNTHROID) 25 MCG tablet Take 1 tablet (25 mcg total) by mouth daily before breakfast.   losartan (COZAAR) 50 MG tablet TAKE ONE-HALF (1/2) TABLET DAILY   potassium chloride SA (KLOR-CON M) 20 MEQ tablet TAKE 1 TABLET DAILY (KEEP UPCOMING APPOINTMENT FOR FUTURE REFILLS)   progesterone (PROMETRIUM) 200 MG capsule Take 200 mg by mouth at bedtime.   hydrOXYzine (ATARAX/VISTARIL) 25 MG tablet Take 0.5 tablets (12.5 mg total) by mouth every 8 (eight) hours as needed for itching. (Patient not taking: Reported on 04/05/2023)    No facility-administered medications prior to visit.   Social History   Tobacco Use   Smoking status: Some Days    Current packs/day: 0.00    Average packs/day: 1 pack/day for 52.0 years (52.0 ttl pk-yrs)    Types: Cigarettes    Start date: 11/09/1969    Last attempt to quit: 11/09/2021    Years since quitting: 1.4    Passive exposure: Current   Smokeless tobacco: Never   Tobacco comments:    Currently 1 pack every 2-3 days    04/06/2022 patient states when she smokes its 1 cigarette    04/05/2023 Patient smokes 2 cigarettes some days   Vaping Use   Vaping status: Never Used  Substance Use Topics   Alcohol use: Yes    Comment: socially   Drug use: No    ROS Negative unless otherwise noted in HPI   Objective:     BP 127/66 (BP Location: Left Arm, Patient Position: Sitting, Cuff Size: Small)   Pulse 71   Resp 18   Ht 5' 1.5" (1.562 m)   Wt 80 lb (36.3 kg)   SpO2 95%   BMI 14.87 kg/m   Physical Exam Constitutional:      General: She is not in acute distress.    Appearance: Normal appearance.  HENT:     Head: Normocephalic and atraumatic.     Right Ear: Tympanic membrane, ear canal and external ear normal.     Left Ear: Tympanic membrane, ear canal and external ear normal.     Nose: Nose normal.     Mouth/Throat:     Mouth: Mucous membranes are moist.     Pharynx: No oropharyngeal exudate or posterior oropharyngeal erythema.  Eyes:     Extraocular Movements: Extraocular movements intact.     Conjunctiva/sclera: Conjunctivae normal.     Pupils: Pupils are equal, round, and reactive to light.  Neck:     Thyroid: No thyroid mass, thyromegaly or thyroid tenderness.  Cardiovascular:     Rate and Rhythm: Normal rate and regular rhythm.     Heart sounds: Normal heart sounds. No murmur heard.    No friction rub. No gallop.  Pulmonary:     Effort: Pulmonary effort is normal. No respiratory distress.     Breath sounds: Normal breath sounds. No wheezing, rhonchi or  rales.  Abdominal:     General: Abdomen is flat. Bowel sounds are normal. There is no distension.     Palpations: There is no mass.     Tenderness: There is no abdominal tenderness. There is no guarding.  Musculoskeletal:        General: Normal range of motion.     Cervical back: Normal range of motion and neck supple.  Lymphadenopathy:     Cervical: No cervical adenopathy.  Skin:    General: Skin is warm and dry.  Neurological:     Mental Status: She is  alert and oriented to person, place, and time.     Cranial Nerves: No cranial nerve deficit.     Motor: No weakness.     Deep Tendon Reflexes: Reflexes normal.  Psychiatric:        Mood and Affect: Mood normal.     Assessment & Plan:  Hypothyroidism, unspecified type Assessment & Plan: Most recent TSH and T4 within normal limits.  Continue levothyroxine 25 mcg daily.  Recommended starting with increased fiber intake in hopes that it helps to resolve the diarrhea.  We discussed that this can be met through Metamucil or the food that she eats.  I will also get her connected with community resources in order to alleviate some of her stress so that she is able to focus on her own health.   Need for influenza vaccination -     Flu Vaccine Trivalent High Dose (Fluad)  Gastroesophageal reflux disease, unspecified whether esophagitis present -     Omeprazole; Take 1 capsule (20 mg total) by mouth daily.  Dispense: 60 capsule; Refill: 0  Peptic ulcer -     Omeprazole; Take 1 capsule (20 mg total) by mouth daily.  Dispense: 60 capsule; Refill: 0  Stress -     AMB Referral to [facility] ([facility])  Trial of omeprazole 20 mg daily for 8 weeks.  If ineffective, symptoms unlikely to be due to peptic ulcer or GERD.  At that point, recommend workup with gastroenterology.  Return in about 2 months (around 06/13/2023) for follow-up for thyroid, stress, stomach pain, fasting blood work 1 week before.    Melida Quitter,  PA

## 2023-04-13 NOTE — Assessment & Plan Note (Signed)
Most recent TSH and T4 within normal limits.  Continue levothyroxine 25 mcg daily.  Recommended starting with increased fiber intake in hopes that it helps to resolve the diarrhea.  We discussed that this can be met through Metamucil or the food that she eats.  I will also get her connected with community resources in order to alleviate some of her stress so that she is able to focus on her own health.

## 2023-04-14 ENCOUNTER — Encounter: Payer: Self-pay | Admitting: Neurology

## 2023-04-14 ENCOUNTER — Ambulatory Visit (INDEPENDENT_AMBULATORY_CARE_PROVIDER_SITE_OTHER): Payer: Medicare Other | Admitting: Neurology

## 2023-04-14 ENCOUNTER — Telehealth: Payer: Self-pay | Admitting: *Deleted

## 2023-04-14 VITALS — BP 119/51 | HR 73 | Ht 61.0 in | Wt 81.6 lb

## 2023-04-14 DIAGNOSIS — G40209 Localization-related (focal) (partial) symptomatic epilepsy and epileptic syndromes with complex partial seizures, not intractable, without status epilepticus: Secondary | ICD-10-CM

## 2023-04-14 MED ORDER — LAMOTRIGINE 100 MG PO TABS: 100.0000 mg | ORAL_TABLET | Freq: Two times a day (BID) | ORAL | 3 refills | Status: DC

## 2023-04-14 NOTE — Progress Notes (Signed)
  Care Coordination   Note   04/14/2023 Name: Christie Moreno MRN: [id number] DOB: 1955-12-17  Christie Moreno is a 67 y.o. year old female who sees Melida Quitter, [place] for primary care. I reached out to Christie Moreno by phone today to offer care coordination services.  Christie Moreno was given information about Care Coordination services today including:   The Care Coordination services include support from the care team which includes your Nurse Coordinator, Clinical Social Worker, or Pharmacist.  The Care Coordination team is here to help remove barriers to the health concerns and goals most important to you. Care Coordination services are voluntary, and the patient may decline or stop services at any time by request to their care team member.   Care Coordination Consent Status: Patient agreed to services and verbal consent obtained.   Follow up plan:  Telephone appointment with care coordination team member scheduled for:  04/18/23  Encounter Outcome:  Patient Scheduled  [facility] Coordination Care Guide  Direct Dial: [phone]

## 2023-04-14 NOTE — Patient Instructions (Signed)
Continue the Lamictal for seizure prevention. When you have your labs done at your primary care, have them collect your lamotrigine (Lamictal) level at that time, the order should be viewable to them and will come to me to result. Thanks

## 2023-04-18 ENCOUNTER — Encounter: Payer: Self-pay | Admitting: *Deleted

## 2023-04-18 ENCOUNTER — Telehealth: Payer: Self-pay | Admitting: *Deleted

## 2023-04-18 NOTE — Patient Outreach (Signed)
Care Coordination   04/18/2023 Name: Christie Moreno MRN: [id number] DOB: [birth year]   Care Coordination Outreach Attempts:  An unsuccessful telephone outreach was attempted today to offer the patient information about available care coordination services.  Follow Up Plan:  Additional outreach attempts will be made to offer the patient care coordination information and services.   Encounter Outcome:  No Answer   Care Coordination Interventions:  No, not indicated    Reece Levy, MSW, LCSW Clinical Social Worker [phone]

## 2023-04-22 ENCOUNTER — Telehealth: Payer: Self-pay | Admitting: Internal Medicine

## 2023-04-22 NOTE — Telephone Encounter (Signed)
Called patient to offer an appointment. No answer. Left a message of my call.

## 2023-04-22 NOTE — Telephone Encounter (Signed)
PT is returning call. Please advise.

## 2023-04-22 NOTE — Telephone Encounter (Signed)
Inbound call from patient stating she has been experiencing severe abdominal pain. States the pain has been been waking her up. Also stated her pcp advised her to use Tums but it has not been helping. Patient states she has not been wanting to eat and has also been losing weight. Patient requesting a call back to discuss further. Please advise, thank you.

## 2023-05-04 ENCOUNTER — Encounter: Payer: Self-pay | Admitting: *Deleted

## 2023-05-04 ENCOUNTER — Ambulatory Visit: Payer: Self-pay | Admitting: *Deleted

## 2023-05-04 NOTE — Patient Outreach (Addendum)
  Care Coordination   Initial Visit Note   05/04/2023 Name: Christie Moreno MRN: [id number] DOB: 01-Sep-1955  Christie Moreno is a 67 y.o. year old female who sees Melida Quitter, [place] for primary care. I spoke with  Christie Moreno by phone today.  What matters to the patients health and wellness today?  Stress/depression related to financial strains    Goals Addressed             This Visit's Progress    Reduce symptoms related to stress and depression       Activities and task to complete in order to accomplish goals.   Call or go to Department of Social Services to inquire about food stamps and other financial support (bills) if needed Consider local food pantry sites to help supplement your food needs- [facility] (select "find help") - Find Food  [place], [place] (ghpfa.org) or download the free app to your smart phone Food and Nutrition Services - 906-775-6972 or apply online at [facility] - ePASS [employer] Referral Service Dial [phone] or [phone] to speak with a call specialist.   Expect follow up call from counseling agency for intake and appointment scheduling Follow up on food resources discussed Start / continue relaxed breathing 3 times daily Keep all upcoming appointment discussed today         SDOH assessments and interventions completed:  Yes  SDOH Interventions Today    Flowsheet Row Most Recent Value  SDOH Interventions   Food Insecurity Interventions Other (Comment)  [concerned about finances]  Transportation Interventions Intervention Not Indicated  Alcohol Usage Interventions Intervention Not Indicated (Score <7)  Depression Interventions/Treatment  Counseling  [open to counseling]  Financial Strain Interventions Other (Comment)  Stress Interventions Provide Counseling        Care Coordination Interventions:  Yes, provided  Interventions Today    Flowsheet Row Most Recent Value  Chronic Disease    Chronic disease during today's visit Other  [depression]  General Interventions   General Interventions Discussed/Reviewed [employer], General Interventions Discussed, General Interventions Reviewed  Mental Health Interventions   Mental Health Discussed/Reviewed Mental Health Discussed, Mental Health Reviewed, Coping Strategies, Anxiety, Depression  [Pt admits to depression related to stress over financial strains- PHQ score indicates moderate symptoms and pt is open to counseling support (not RX) and agrees to referral being placed with in-network agency]  Nutrition Interventions   Nutrition Discussed/Reviewed Nutrition Discussed  [Pt reports she takes Ensure for energy]  Pharmacy Interventions   Pharmacy Dicussed/Reviewed Pharmacy Topics Discussed  Safety Interventions   Safety Discussed/Reviewed Safety Discussed  [Provided pt with "988" number to call for mental health crisis support 24/7]       Follow up plan: Follow up call scheduled for 05/18/23    Encounter Outcome:  Patient Visit Completed

## 2023-05-04 NOTE — Telephone Encounter (Signed)
Appointment scheduled for llq abdominal pain.

## 2023-05-04 NOTE — Patient Instructions (Signed)
Visit Information  Thank you for taking time to visit with me today. Please don't hesitate to contact me if I can be of assistance to you.   Following are the goals we discussed today:   Goals Addressed             This Visit's Progress    Reduce symptoms related to stress and depression       Activities and task to complete in order to accomplish goals.   Call or go to Department of Social Services to inquire about food stamps and other financial support (bills) if needed Consider local food pantry sites to help supplement your food needs- [facility] (select "find help") - Find Food  [place], [place] (ghpfa.org) or download the free app to your smart phone Food and Nutrition Services - 724-782-4673 or apply online at [facility] - ePASS [employer] Way Referral Service Dial [phone] or [phone] to speak with a call specialist.   Expect follow up call from counseling agency for intake and appointment scheduling Follow up on food resources discussed Start / continue relaxed breathing 3 times daily Keep all upcoming appointment discussed today         Our next appointment is by telephone on 05/18/23  Please call the care guide team at [phone] if you need to cancel or reschedule your appointment.   If you are experiencing a Mental Health or Behavioral Health Crisis or need someone to talk to, please call the Suicide and Crisis Lifeline: 988 call 911   The patient verbalized understanding of instructions, educational materials, and care plan provided today and DECLINED offer to receive copy of patient instructions, educational materials, and care plan.   Telephone follow up appointment with care management team member scheduled for: 05/18/23  Reece Levy, MSW, LCSW Clinical Social Worker [phone]

## 2023-05-06 ENCOUNTER — Encounter: Payer: Self-pay | Admitting: Physician Assistant

## 2023-05-06 ENCOUNTER — Ambulatory Visit (INDEPENDENT_AMBULATORY_CARE_PROVIDER_SITE_OTHER): Payer: Medicare Other | Admitting: Physician Assistant

## 2023-05-06 ENCOUNTER — Other Ambulatory Visit (INDEPENDENT_AMBULATORY_CARE_PROVIDER_SITE_OTHER): Payer: Medicare Other

## 2023-05-06 VITALS — BP 110/66 | HR 57 | Ht 61.0 in | Wt 78.2 lb

## 2023-05-06 DIAGNOSIS — I2510 Atherosclerotic heart disease of native coronary artery without angina pectoris: Secondary | ICD-10-CM | POA: Diagnosis not present

## 2023-05-06 DIAGNOSIS — K529 Noninfective gastroenteritis and colitis, unspecified: Secondary | ICD-10-CM | POA: Diagnosis not present

## 2023-05-06 DIAGNOSIS — J4489 Other specified chronic obstructive pulmonary disease: Secondary | ICD-10-CM

## 2023-05-06 DIAGNOSIS — R300 Dysuria: Secondary | ICD-10-CM

## 2023-05-06 DIAGNOSIS — R1032 Left lower quadrant pain: Secondary | ICD-10-CM

## 2023-05-06 DIAGNOSIS — I350 Nonrheumatic aortic (valve) stenosis: Secondary | ICD-10-CM

## 2023-05-06 LAB — BASIC METABOLIC PANEL
BUN: 23 mg/dL (ref 6–23)
CO2: 32 meq/L (ref 19–32)
Calcium: 9.3 mg/dL (ref 8.4–10.5)
Chloride: 97 meq/L (ref 96–112)
Creatinine, Ser: 0.89 mg/dL (ref 0.40–1.20)
GFR: 66.2 mL/min (ref >60.00)
Glucose, Bld: 82 mg/dL (ref 70–99)
Potassium: 3.9 meq/L (ref 3.5–5.1)
Sodium: 137 meq/L (ref 135–145)

## 2023-05-06 LAB — C-REACTIVE PROTEIN: CRP: <1 mg/dL (ref 0.5–20.0)

## 2023-05-06 LAB — URINALYSIS, ROUTINE W REFLEX MICROSCOPIC
Bilirubin Urine: NEGATIVE
Ketones, ur: NEGATIVE
Leukocytes,Ua: NEGATIVE
Nitrite: NEGATIVE
Specific Gravity, Urine: 1.01 (ref 1.000–1.030)
Total Protein, Urine: NEGATIVE
Urine Glucose: >=1000 — AB
Urobilinogen, UA: 0.2 (ref 0.0–1.0)
pH: 6 (ref 5.0–8.0)

## 2023-05-06 LAB — CBC WITH DIFFERENTIAL/PLATELET
Basophils Absolute: 0.1 10*3/uL (ref 0.0–0.1)
Basophils Relative: 0.7 % (ref 0.0–3.0)
Eosinophils Absolute: 0.2 10*3/uL (ref 0.0–0.7)
Eosinophils Relative: 2 % (ref 0.0–5.0)
HCT: 43.8 % (ref 36.0–46.0)
Hemoglobin: 14.3 g/dL (ref 12.0–15.0)
Lymphocytes Relative: 23.4 % (ref 12.0–46.0)
Lymphs Abs: 1.7 10*3/uL (ref 0.7–4.0)
MCHC: 32.7 g/dL (ref 30.0–36.0)
MCV: 91.1 fL (ref 78.0–100.0)
Monocytes Absolute: 0.4 10*3/uL (ref 0.1–1.0)
Monocytes Relative: 6.1 % (ref 3.0–12.0)
Neutro Abs: 5 10*3/uL (ref 1.4–7.7)
Neutrophils Relative %: 67.8 % (ref 43.0–77.0)
Platelets: 170 10*3/uL (ref 150.0–400.0)
RBC: 4.81 Mil/uL (ref 3.87–5.11)
RDW: 14.8 % (ref 11.5–15.5)
WBC: 7.4 10*3/uL (ref 4.0–10.5)

## 2023-05-06 LAB — HEPATIC FUNCTION PANEL
ALT: 35 U/L (ref 0–35)
AST: 35 U/L (ref 0–37)
Albumin: 4 g/dL (ref 3.5–5.2)
Alkaline Phosphatase: 60 U/L (ref 39–117)
Bilirubin, Direct: 0.1 mg/dL (ref 0.0–0.3)
Total Bilirubin: 0.5 mg/dL (ref 0.2–1.2)
Total Protein: 6.9 g/dL (ref 6.0–8.3)

## 2023-05-06 LAB — SEDIMENTATION RATE: Sed Rate: 7 mm/h (ref 0–30)

## 2023-05-06 NOTE — Progress Notes (Signed)
I agree with the assessment and plan as outlined by Ms. Collier. 

## 2023-05-06 NOTE — Progress Notes (Signed)
Plan to get    05/06/2023 Christie Moreno [id number] [birth year]  Referring provider: Melida Quitter, PA Primary GI doctor: Dr. Leonides Schanz  ASSESSMENT AND PLAN:     Abdominal Pain and Altered Bowel Habits Chronic left lower quadrant pain, daily for at least 2 months, worse after eating, occasionally wakes patient from sleep. Bowel movements every other day, hard in consistency for the past 2 weeks. No blood in stool. Recent use of antacids (Rolaids, Mylanta) for pain thinking it was gas. Rectal exam revealed hard stool in the vault and internal hemorrhoids. No blood detected on stool guaiac test. - most likely this represents contipation but with previous colonoscopy will get labs to evaluate for inflammation, if positive can consider cross sectional imaging. -Start MiraLAX and Benefiber or Citrucel for suspected constipation. -Provide samples of Ibogard (peppermint oil) for potential antispasmodic effect. -Follow up in 1-2 months, or sooner if symptoms worsen or blood in stool is noticed.  Urinary Symptoms Recent urinary tract infection treated with a 6-day course of antibiotics. Still experiencing burning during urination. -Check urine sample for persistent infection.  Weight Loss Patient's weight has been decreasing. Patient admits to possibly eating too little and has Ensure at home but does not consume regularly. -Encourage increased protein intake, including regular consumption of Ensure. -Consider low-dose lung CT scan due to smoking history and weight loss. - update MGM  Smoking Patient continues to smoke, with recent increase in frequency. Wheezing noted on exam. -Discuss smoking cessation strategies and potential for low-dose lung CT scan with primary care provider.  Follow-up Schedule follow-up appointment with Dr. Leonides Schanz in 2 months.       Patient Care Team: Melida Quitter, PA as PCP - General (Family Medicine) Runell Gess, MD as PCP - Cardiology  (Cardiology)  HISTORY OF PRESENT ILLNESS: 67 y.o. female with a past medical history of seizures, coronary disease, tobacco use and others listed below presents for evaluation of LLQ AB pain.   08/11/2022 office visit with Dr. Leonides Schanz after recent colonoscopy 07/2022 showing significant inflammation in cecum unclear chronicity could be associate with NSAIDs versus early IBD ischemia recent infection.  Patient was relatively asymptomatic suggested recall colonoscopy 5 years or sooner if any symptoms. Recall: 07/2027  Discussed the use of AI scribe software for clinical note transcription with the patient, who gave verbal consent to proceed.  History of Present Illness   The patient, with a history of cecal inflammation, presents with abdominal pain and occasional diarrhea. They attribute the diarrhea to the use of Tums and Mylanta for abdominal pain. They also report constipation and hard stools, particularly in the last two weeks. The patient has bowel movements every other day. They deny daily bowel movements and blood in the stool.  The patient has been experiencing abdominal pain, particularly on the left side, for a couple of months. The pain is constant, seems to worsen after eating, and can wake them up at night. They deny fevers, chills, and weight loss but report a decrease in appetite. They also mention a recent urinary tract infection and ongoing burning during urination. They have been taking more Tums or Mylanta for the abdominal pain.      Wt Readings from Last 8 Encounters:  05/06/23 78 lb 3.2 oz (35.5 kg)  04/14/23 81 lb 9.6 oz (37 kg)  04/13/23 80 lb (36.3 kg)  04/05/23 79 lb 3.2 oz (35.9 kg)  03/17/23 81 lb (36.7 kg)  02/09/23 83 lb (37.6 kg)  12/29/22 86  lb (39 kg)  11/24/22 87 lb (39.5 kg)      CT A/P w/o contrast 11/03/21: IMPRESSION: Small bilateral pleural effusions.  Bibasilar atelectasis. Coronary artery disease.  Aortic atherosclerosis. No definite acute process  in the abdomen or pelvis.   RUQ U/S 11/03/21: IMPRESSION: 1. Unremarkable sonographic appearance of the liver. 2. Minimal layering sludge within the gallbladder. No gallstone or evidence of cholecystitis.   TTE 03/02/22: IMPRESSIONS   1. Left ventricular ejection fraction, by estimation, is 50 to 55%. The  left ventricle has low normal function. The left ventricle has no regional  wall motion abnormalities. Left ventricular diastolic parameters are  indeterminate. Elevated left atrial pressure.   2. Right ventricular systolic function is normal. The right ventricular  size is normal. There is normal pulmonary artery systolic pressure.   3. Left atrial size was mild to moderately dilated.   4. The mitral valve is degenerative. Mild mitral valve regurgitation.  Mild mitral stenosis. The mean mitral valve gradient is 3.0 mmHg.   5. Functionally bicuspid. The left and non-coronary cusps are essentially  fused. The aortic valve is calcified. There is severe calcifcation of the  aortic valve. There is severe thickening of the aortic valve. Aortic valve  regurgitation is mild to  moderate. Mild aortic valve stenosis. Aortic regurgitation PHT measures  414 msec. Aortic valve area, by VTI measures 1.03 cm. Aortic valve mean  gradient measures 12.0 mmHg. Aortic valve Vmax measures 2.34 m/s.   6. The inferior vena cava is normal in size with greater than 50%  respiratory variability, suggesting right atrial pressure of 3 mmHg.    Colonoscopy 07/19/22: - The examined portion of the ileum was normal. - Localized severe inflammation was found in the cecum. Biopsied. - One 3 mm polyp in the rectum, removed with a cold snare. Resected and retrieved. - Non-bleeding internal hemorrhoids. Path: 1. Surgical [P], right colon FOCAL ACUTE COLITIS WITH EROSION/ULCERATION NEGATIVE FOR DYSPLASIA AND GRANULOMAS 2. Surgical [P], left colon FOCAL ACUTE COLITIS/CRYPTITIS 3. Surgical [P], colon, rectum, polyp  (1) HYPERPLASTIC POLYP WITH CHANGES OF MUCOSAL PROLAPSE NEGATIVE FOR DYSPLASIA AND CARCINOMA Microscopic Comment 1. -2. The right and left colon biopsies show multiple fragments of colonic mucosa. Within the right colon biopsies several fragments exhibit mild architectural distortion in the form of elongated gated tortuous and branching crypts within a lamina propria showing an increased and focally mixed mononuclear cell infiltrate including neutrophils which infiltrate the epithelium. Within 1 fragment of the lamina propria is hyalinized with overlying surface erosion suggestive of a chronic ulcer. The left colon biopsies exhibit a normal crypt architecture with a patchy variable increased mononuclear cell infiltrate including neutrophils. Focally these neutrophils infiltrate the crypt epithelium. In both of the biopsies there is no increase in intraepithelial lymphocytes and the collagen table is of normal thickness. No granulomas or parasites are seen. And there is no evidence of dysplasia or carcinoma. Within the right colon biopsies architectural distortion is felt to be related to the erosion/ulceration and is otherwise nonspecific. These changes are not definitive for chronicity but evolving chronicity cannot be ruled out. Overall the findings of focal active colitis is commonly associated with a resolving acute self-limited colitis; however, certain drugs particularly NSAIDs may elicit similar changes. Early idiopathic inflammatory bowel disease may also present with these changes although this is not favored. Clinical, microbiologic and endoscopic correlation commended.    She  reports that she has been smoking cigarettes. She started smoking about 53 years ago. She  has a 52 pack-year smoking history. She has been exposed to tobacco smoke. She has never used smokeless tobacco. She reports that she does not currently use alcohol. She reports that she does not use drugs.  RELEVANT LABS AND  IMAGING:  Results   DIAGNOSTIC Colonoscopy: Inflammation in the cecum (07/2022) Rectal exam: Internal hemorrhoids, hard stool, negative for blood (05/06/2023)      CBC    Component Value Date/Time   WBC 8.7 06/18/2022 0905   WBC 9.3 11/05/2021 0114   RBC 4.56 06/18/2022 0905   RBC 3.87 11/05/2021 0114   HGB 12.9 06/18/2022 0905   HCT 39.8 06/18/2022 0905   PLT 184 06/18/2022 0905   MCV 87 06/18/2022 0905   MCH 28.3 06/18/2022 0905   MCH 30.5 11/05/2021 0114   MCHC 32.4 06/18/2022 0905   MCHC 33.8 11/05/2021 0114   RDW 13.7 06/18/2022 0905   LYMPHSABS 2.2 06/18/2022 0905   MONOABS 0.6 11/02/2021 0549   EOSABS 0.4 06/18/2022 0905   BASOSABS 0.1 06/18/2022 0905   Recent Labs    06/18/22 0905  HGB 12.9    CMP     Component Value Date/Time   NA 138 06/18/2022 0905   K 4.4 06/18/2022 0905   CL 98 06/18/2022 0905   CO2 24 06/18/2022 0905   GLUCOSE 88 06/18/2022 0905   GLUCOSE 89 11/25/2021 1459   BUN 26 06/18/2022 0905   CREATININE 1.03 (H) 06/18/2022 0905   CALCIUM 9.7 06/18/2022 0905   PROT 6.7 06/18/2022 0905   ALBUMIN 4.2 06/18/2022 0905   AST 34 06/18/2022 0905   ALT 46 (H) 06/18/2022 0905   ALKPHOS 71 06/18/2022 0905   BILITOT 0.4 06/18/2022 0905   GFRNONAA 50 (L) 11/25/2021 1459   GFRAA 88 04/23/2019 1021      Latest Ref Rng & Units 06/18/2022    9:05 AM 03/03/2022    9:54 AM 11/04/2021   12:50 AM  Hepatic Function  Total Protein 6.0 - 8.5 g/dL 6.7  6.1  5.1   Albumin 3.9 - 4.9 g/dL 4.2  3.9  2.7   AST 0 - 40 IU/L 34  56  56   ALT 0 - 32 IU/L 46  42  111   Alk Phosphatase 44 - 121 IU/L 71  68  67   Total Bilirubin 0.0 - 1.2 mg/dL 0.4  0.2  0.2       Current Medications:   Current Outpatient Medications (Endocrine & Metabolic):    estrogens, conjugated, (PREMARIN) 1.25 MG tablet, Take 1.25 mg by mouth daily.   JARDIANCE 10 MG TABS tablet, TAKE 1 TABLET DAILY   levothyroxine (SYNTHROID) 25 MCG tablet, Take 1 tablet (25 mcg total) by mouth daily  before breakfast.   progesterone (PROMETRIUM) 200 MG capsule, Take 200 mg by mouth at bedtime.  Current Outpatient Medications (Cardiovascular):    atorvastatin (LIPITOR) 80 MG tablet, TAKE 1 TABLET(80 MG) BY MOUTH DAILY AT 6 PM   carvedilol (COREG) 3.125 MG tablet, TAKE 1 TABLET TWICE A DAY WITH MEALS   furosemide (LASIX) 40 MG tablet, TAKE ONE-HALF (1/2) TABLET (20 MG TOTAL) DAILY (KEEP UPCOMING APPOINTMENT FOR FUTURE REFILLS)   losartan (COZAAR) 50 MG tablet, TAKE ONE-HALF (1/2) TABLET DAILY  Current Outpatient Medications (Respiratory):    albuterol (PROVENTIL) (2.5 MG/3ML) 0.083% nebulizer solution, Take 3 mLs (2.5 mg total) by nebulization every 6 (six) hours as needed for wheezing or shortness of breath. (Patient taking differently: Take 2.5 mg by nebulization as needed  for wheezing or shortness of breath.)   ipratropium-albuterol (DUONEB) 0.5-2.5 (3) MG/3ML SOLN, Take 3 mLs by nebulization every 6 (six) hours as needed.  Current Outpatient Medications (Analgesics):    aspirin 81 MG EC tablet, Take 1 tablet (81 mg total) by mouth daily. Swallow whole.   Current Outpatient Medications (Other):    feeding supplement (ENSURE ENLIVE / ENSURE PLUS) LIQD, Take 237 mLs by mouth 2 (two) times daily between meals.   hydrOXYzine (ATARAX/VISTARIL) 25 MG tablet, Take 0.5 tablets (12.5 mg total) by mouth every 8 (eight) hours as needed for itching. (Patient taking differently: Take 12.5 mg by mouth as needed for itching.)   lamoTRIgine (LAMICTAL) 100 MG tablet, Take 1 tablet (100 mg total) by mouth 2 (two) times daily.   omeprazole (PRILOSEC) 20 MG capsule, Take 1 capsule (20 mg total) by mouth daily.   potassium chloride SA (KLOR-CON M) 20 MEQ tablet, TAKE 1 TABLET DAILY (KEEP UPCOMING APPOINTMENT FOR FUTURE REFILLS)  Medical History:  Past Medical History:  Diagnosis Date   Heart failure (HCC)    Seizures (HCC)    none in years, recently had medication change   Allergies:  Allergies   Allergen Reactions   Azithromycin Nausea And Vomiting   Erythromycin     Sensitivity (GI)   Percocet [Oxycodone-Acetaminophen]    Penicillins Itching and Rash     Surgical History:  She  has a past surgical history that includes RIGHT/LEFT HEART CATH AND CORONARY ANGIOGRAPHY (N/A, 11/05/2021) and Colonoscopy. Family History:  Her family history includes Breast cancer in her maternal aunt and mother; Cancer in her mother; Diabetes in her mother and sister; Heart Problems in her father; Stroke in her mother. She was adopted.  REVIEW OF SYSTEMS  : All other systems reviewed and negative except where noted in the History of Present Illness.  PHYSICAL EXAM: BP 110/66   Pulse (!) 57   Ht 5\' 1"  (1.549 m)   Wt 78 lb 3.2 oz (35.5 kg)   SpO2 97%   BMI 14.78 kg/m  General Appearance: chronically ill appearing, older than stated age, in no apparent distress. Head:   Normocephalic and atraumatic. Eyes:  sclerae anicteric,conjunctive pink  Respiratory: Respiratory effort normal, wheezing bilateral lungs, rhonchi cleared with a cough. Cardio: RRR with systolic murmur. Peripheral pulses intact.  Abdomen: Soft,  Flat ,active bowel sounds. No tenderness . Without guarding and Without rebound. No masses. Rectal: Normal external rectal exam, normal rectal tone, appreciated internal hemorrhoids, non-tender, no masses, , large volume hard brown stool, hemoccult Negative Musculoskeletal: Full ROM, Normal gait. Without edema. Skin:  Dry and intact without significant lesions or rashes Neuro: Alert and  oriented x4;  No focal deficits. Psych:  Cooperative. Normal mood and affect.    Doree Albee, PA-C 12:26 PM

## 2023-05-06 NOTE — Patient Instructions (Addendum)
Your provider has requested that you go to the basement level for lab work before leaving today. Press "B" on the elevator. The lab is located at the first door on the left as you exit the elevator.  First do a trial off milk/lactose products if you use them.  Add fiber like benefiber or citracel once a day Increase activity Can do trial of IBGard which is over the counter for AB pain- Take 1-2 capsules once a day for maintence or twice a day during a flare  Miralax is an osmotic laxative.  It only brings more water into the stool.  This is safe to take daily.  Can take up to 17 gram of miralax twice a day.  Mix with juice or coffee.  Start 1 capful at night for 3-4 days and reassess your response in 3-4 days.  You can increase and decrease the dose based on your response.  Remember, it can take up to 3-4 days to take effect OR for the effects to wear off.   I often pair this with benefiber in the morning to help assure the stool is not too loose.   You are schedule for a follow up visit on 07/19/23 at 11:10 am with Dr Leonides Schanz   FODMAP stands for fermentable oligo-, di-, mono-saccharides and polyols (1). These are the scientific terms used to classify groups of carbs that are difficult for our body to digest and that are notorious for triggering digestive symptoms like bloating, gas, loose stools and stomach pain.   You can try low FODMAP diet  - start with eliminating just one column at a time that you feel may be a trigger for you. - the table at the very bottom contains foods that are low in FODMAPs   Sometimes trying to eliminate the FODMAP's from your diet is difficult or tricky, if you are stuggling with trying to do the elimination diet you can try an enzyme.  There is a food enzymes that you sprinkle in or on your food that helps break down the FODMAP. You can read more about the enzyme by going to this site: https://fodzyme.com/

## 2023-05-07 LAB — IGA: Immunoglobulin A: 130 mg/dL (ref 70–320)

## 2023-05-07 LAB — TISSUE TRANSGLUTAMINASE, IGA: (tTG) Ab, IgA: <1 U/mL

## 2023-05-11 ENCOUNTER — Other Ambulatory Visit: Payer: Self-pay | Admitting: Cardiovascular Disease

## 2023-05-16 ENCOUNTER — Ambulatory Visit: Payer: Self-pay | Admitting: *Deleted

## 2023-05-16 NOTE — Patient Outreach (Signed)
  Care Coordination   Follow Up Visit Note   05/16/2023 Name: Christie Moreno MRN: [id number] DOB: 1956-05-16  Christie Moreno is a 68 y.o. year old female who sees Melida Quitter, [place] for primary care. I spoke with  Christie Moreno by phone today.  What matters to the patients health and wellness today?  Advised pt of call received about her intake paperwork not being completed yet. Pt plans to go this week to complete.    Goals Addressed             This Visit's Progress    Reduce symptoms related to stress and depression       Activities and task to complete in order to accomplish goals.   Call or go to Department of Social Services to inquire about food stamps and other financial support (bills) if needed Consider local food pantry sites to help supplement your food needs- [facility] (select "find help") - Find Food  [place], [place] (ghpfa.org) or download the free app to your smart phone Food and Nutrition Services - (914)708-2433 or apply online at [facility] - ePASS United Way Referral Service Dial [phone] or [phone] to speak with a call specialist.   Go to TTC office as discussed to complete paperwork for intake prior to scheduling Follow up on food resources discussed Start / continue relaxed breathing 3 times daily Keep all upcoming appointment discussed today         SDOH assessments and interventions completed:  Yes     Care Coordination Interventions:  Yes, provided  Interventions Today    Flowsheet Row Most Recent Value  Chronic Disease   Chronic disease during today's visit Other  [depression]  Mental Health Interventions   Mental Health Discussed/Reviewed Mental Health Discussed, Other  [Pt  has been unable to complete the intake paperwork online due to no WIFI. Pt plans to go to TTC office to complete paperwork- then they will be able to get her scheduled with behavioral health specialist]       Follow  up plan: Follow up call scheduled for 05/18/23    Encounter Outcome:  Patient Visit Completed

## 2023-05-16 NOTE — Patient Instructions (Signed)
Visit Information  Thank you for taking time to visit with me today. Please don't hesitate to contact me if I can be of assistance to you.   Following are the goals we discussed today:   Goals Addressed             This Visit's Progress    Reduce symptoms related to stress and depression       Activities and task to complete in order to accomplish goals.   Call or go to Department of Social Services to inquire about food stamps and other financial support (bills) if needed Consider local food pantry sites to help supplement your food needs- [facility] (select "find help") - Find Food  [place], [place] (ghpfa.org) or download the free app to your smart phone Food and Nutrition Services - [phone] or apply online at [facility] - ePASS United Way Referral Service Dial [phone] or [phone] to speak with a call specialist.   Go to TTC office as discussed to complete paperwork for intake prior to scheduling Follow up on food resources discussed Start / continue relaxed breathing 3 times daily Keep all upcoming appointment discussed today         Our next appointment is by telephone on 05/18/23  Please call the care guide team at [phone] if you need to cancel or reschedule your appointment.   If you are experiencing a Mental Health or Behavioral Health Crisis or need someone to talk to, please call the Suicide and Crisis Lifeline: 988 call 911   The patient verbalized understanding of instructions, educational materials, and care plan provided today and DECLINED offer to receive copy of patient instructions, educational materials, and care plan.   Telephone follow up appointment with care management team member scheduled for: 05/18/23  Reece Levy, MSW, LCSW Clinical Social Worker [phone]

## 2023-05-18 ENCOUNTER — Ambulatory Visit: Payer: Self-pay | Admitting: *Deleted

## 2023-05-18 NOTE — Patient Instructions (Signed)
Visit Information  Thank you for taking time to visit with me today. Please don't hesitate to contact me if I can be of assistance to you.   Following are the goals we discussed today:   Goals Addressed             This Visit's Progress    Reduce symptoms related to stress and depression       Activities and task to complete in order to accomplish goals.   Call or go to Department of Social Services to inquire about food stamps and other financial support (bills) if needed Consider local food pantry sites to help supplement your food needs- [facility] (select "find help") - Find Food  [place], [place] (ghpfa.org) or download the free app to your smart phone Food and Nutrition Services - [phone] or apply online at [facility] - ePASS United Way Referral Service Dial [phone] or [phone] to speak with a call specialist.   Complete paperwork once received by mail and return to TTC office for initial intake scheduling Follow up on food resources discussed Start / continue relaxed breathing 3 times daily Keep all upcoming appointment discussed today Consider local hospice agency for free grief and loss support and counseling         Our next appointment is by telephone on 06/16/23   Please call the care guide team at [phone] if you need to cancel or reschedule your appointment.   If you are experiencing a Mental Health or Behavioral Health Crisis or need someone to talk to, please call the Suicide and Crisis Lifeline: 988 call 911   The patient verbalized understanding of instructions, educational materials, and care plan provided today and DECLINED offer to receive copy of patient instructions, educational materials, and care plan.   Telephone follow up appointment with care management team member scheduled for:06/16/23  Reece Levy, MSW, LCSW Clinical Social Worker [phone]

## 2023-05-18 NOTE — Patient Outreach (Addendum)
  Care Coordination   Follow Up Visit Note   05/18/2023 Name: Christie Moreno MRN: [id number] DOB: [birth year]  Christie Moreno is a 67 y.o. year old female who sees Melida Quitter, [place] for primary care. I spoke with  Christie Moreno by phone today.  What matters to the patients health and wellness today?  Loss of family member  unexpectedly-     Goals Addressed             This Visit's Progress    Reduce symptoms related to stress and depression       Activities and task to complete in order to accomplish goals.   Call or go to Department of Social Services to inquire about food stamps and other financial support (bills) if needed Consider local food pantry sites to help supplement your food needs- [facility] (select "find help") - Find Food  [place], [place] (ghpfa.org) or download the free app to your smart phone Food and Nutrition Services - [phone] or apply online at [facility] - ePASS United Way Referral Service Dial [phone] or [phone] to speak with a call specialist.   Complete paperwork once received by mail and return to TTC office for initial intake scheduling Follow up on food resources discussed Start / continue relaxed breathing 3 times daily Keep all upcoming appointment discussed today Consider local hospice agency for free grief and loss support and counseling         SDOH assessments and interventions completed:  Yes     Care Coordination Interventions:  Yes, provided  Interventions Today    Flowsheet Row Most Recent Value  Chronic Disease   Chronic disease during today's visit Other  [depression]  General Interventions   General Interventions Discussed/Reviewed [facility]  [Pt reminded of the resources discussed: DSS, food sites,e tc]  Mental Health Interventions   Mental Health Discussed/Reviewed Grief and Loss  [CSW advised pt of free grief counseling and support available at local  Hospice agency. She plans to share this with her family members as well.]  Nutrition Interventions   Nutrition Discussed/Reviewed Nutrition Discussed  [provided info to pt previously for food pantry/sites for followup]       Follow up plan: Follow up call scheduled for 06/16/23    Encounter Outcome:  Patient Visit Completed

## 2023-05-31 ENCOUNTER — Other Ambulatory Visit: Payer: Self-pay | Admitting: Cardiovascular Disease

## 2023-06-06 ENCOUNTER — Other Ambulatory Visit: Payer: Medicare Other

## 2023-06-06 DIAGNOSIS — E039 Hypothyroidism, unspecified: Secondary | ICD-10-CM

## 2023-06-06 DIAGNOSIS — E782 Mixed hyperlipidemia: Secondary | ICD-10-CM

## 2023-06-06 DIAGNOSIS — Z131 Encounter for screening for diabetes mellitus: Secondary | ICD-10-CM

## 2023-06-06 DIAGNOSIS — R636 Underweight: Secondary | ICD-10-CM

## 2023-06-09 ENCOUNTER — Telehealth: Payer: Self-pay | Admitting: Neurology

## 2023-06-09 DIAGNOSIS — G40209 Localization-related (focal) (partial) symptomatic epilepsy and epileptic syndromes with complex partial seizures, not intractable, without status epilepticus: Secondary | ICD-10-CM

## 2023-06-09 DIAGNOSIS — E039 Hypothyroidism, unspecified: Secondary | ICD-10-CM

## 2023-06-09 NOTE — Telephone Encounter (Signed)
Pt said have not had lablwork done. Primary care could not draw my blood and they are sending to [facility]. Asking if need an order to be able for [facility] to do lamoTRIgine (LAMICTAL) levels. Would like a call back.

## 2023-06-09 NOTE — Telephone Encounter (Signed)
1st attempt unable to leave msg  

## 2023-06-11 LAB — HEMOGLOBIN A1C
Est. average glucose Bld gHb Est-mCnc: 123 mg/dL
Hgb A1c MFr Bld: 5.9 % — ABNORMAL HIGH (ref 4.8–5.6)

## 2023-06-11 LAB — LIPID PANEL
Chol/HDL Ratio: 1.8 ratio (ref 0.0–4.4)
Cholesterol, Total: 150 mg/dL (ref 100–199)
HDL: 84 mg/dL (ref >39)
LDL Chol Calc (NIH): 51 mg/dL (ref 0–99)
Triglycerides: 77 mg/dL (ref 0–149)
VLDL Cholesterol Cal: 15 mg/dL (ref 5–40)

## 2023-06-11 LAB — COMPREHENSIVE METABOLIC PANEL
ALT: 40 [IU]/L — ABNORMAL HIGH (ref 0–32)
AST: 46 [IU]/L — ABNORMAL HIGH (ref 0–40)
Albumin: 3.9 g/dL (ref 3.9–4.9)
Alkaline Phosphatase: 64 [IU]/L (ref 44–121)
BUN/Creatinine Ratio: 23 (ref 12–28)
BUN: 21 mg/dL (ref 8–27)
Bilirubin Total: 0.3 mg/dL (ref 0.0–1.2)
CO2: 25 mmol/L (ref 20–29)
Calcium: 9.1 mg/dL (ref 8.7–10.3)
Chloride: 98 mmol/L (ref 96–106)
Creatinine, Ser: 0.9 mg/dL (ref 0.57–1.00)
Globulin, Total: 2.1 g/dL (ref 1.5–4.5)
Glucose: 77 mg/dL (ref 70–99)
Potassium: 4.5 mmol/L (ref 3.5–5.2)
Sodium: 136 mmol/L (ref 134–144)
Total Protein: 6 g/dL (ref 6.0–8.5)
eGFR: 69 mL/min/{1.73_m2} (ref >59)

## 2023-06-11 LAB — CBC WITH DIFFERENTIAL/PLATELET
Basophils Absolute: 0 10*3/uL (ref 0.0–0.2)
Basos: 1 %
EOS (ABSOLUTE): 0.2 10*3/uL (ref 0.0–0.4)
Eos: 3 %
Hematocrit: 42.6 % (ref 34.0–46.6)
Hemoglobin: 13.7 g/dL (ref 11.1–15.9)
Immature Grans (Abs): 0 10*3/uL (ref 0.0–0.1)
Immature Granulocytes: 0 %
Lymphocytes Absolute: 1.7 10*3/uL (ref 0.7–3.1)
Lymphs: 23 %
MCH: 29.8 pg (ref 26.6–33.0)
MCHC: 32.2 g/dL (ref 31.5–35.7)
MCV: 93 fL (ref 79–97)
Monocytes Absolute: 0.5 10*3/uL (ref 0.1–0.9)
Monocytes: 7 %
Neutrophils Absolute: 4.8 10*3/uL (ref 1.4–7.0)
Neutrophils: 66 %
Platelets: 171 10*3/uL (ref 150–450)
RBC: 4.6 x10E6/uL (ref 3.77–5.28)
RDW: 13.5 % (ref 11.7–15.4)
WBC: 7.3 10*3/uL (ref 3.4–10.8)

## 2023-06-11 LAB — TSH RFX ON ABNORMAL TO FREE T4: TSH: 2.79 u[IU]/mL (ref 0.450–4.500)

## 2023-06-13 ENCOUNTER — Ambulatory Visit (INDEPENDENT_AMBULATORY_CARE_PROVIDER_SITE_OTHER): Payer: Medicare Other | Admitting: Family Medicine

## 2023-06-13 ENCOUNTER — Encounter: Payer: Self-pay | Admitting: Family Medicine

## 2023-06-13 VITALS — BP 135/70 | HR 61 | Resp 18 | Ht 61.0 in | Wt 76.0 lb

## 2023-06-13 DIAGNOSIS — F17200 Nicotine dependence, unspecified, uncomplicated: Secondary | ICD-10-CM

## 2023-06-13 DIAGNOSIS — E039 Hypothyroidism, unspecified: Secondary | ICD-10-CM | POA: Diagnosis not present

## 2023-06-13 NOTE — Assessment & Plan Note (Signed)
TSH within normal limits.  Continue levothyroxine 25 mcg daily.  Will continue to monitor.

## 2023-06-13 NOTE — Patient Instructions (Addendum)
I gave you some additional information about 2 options for medication that can help with smoking cessation.  One is called Chantix (varenicline), the other is called Wellbutrin (bupropion).  Both are fantastic options for smoking cessation, Wellbutrin is the medicine that we discussed can be helpful with low mood as well.  AT THE PHARMACY: -PCV20 pneumonia -Shingles  -Tdap tetanus booster

## 2023-06-13 NOTE — Assessment & Plan Note (Signed)
With increased smoking due to stress, she is interested in smoking cessation.  She is not at the point where she would like to start medication now, but she would like to know more about what the options are.  Provided additional information about Chantix and Wellbutrin and will discuss at her next appointment further.

## 2023-06-13 NOTE — Telephone Encounter (Signed)
Called and spoke to pt and she stated that they had lamictal levels drawn. I told her that we didn't have her results yet.

## 2023-06-13 NOTE — Progress Notes (Signed)
Established Patient Office Visit  Subjective   Patient ID: Christie Moreno, female    DOB: [birth year]  Age: 67 y.o. MRN: [id number]  Chief Complaint  Patient presents with   Hypothyroidism    HPI Christie Moreno is a 67 y.o. female presenting today for follow up of hypothyroidism. Taking levothyroxine 25 mcg daily regularly in the AM away from food and vitamins. Denies fatigue, weight changes, heat/cold intolerance, skin/hair changes, bowel changes, CVS symptoms.  At last appointment, discussed increasing fiber intake to alleviate loose stools.  Also connected her with care coordination to alleviate some of the stress that she has been experiencing at home that may also be contributing to diarrhea.  She is still under significant stress which has been causing her to smoke more often.  Outpatient Medications Prior to Visit  Medication Sig   albuterol (PROVENTIL) (2.5 MG/3ML) 0.083% nebulizer solution Take 3 mLs (2.5 mg total) by nebulization every 6 (six) hours as needed for wheezing or shortness of breath. (Patient taking differently: Take 2.5 mg by nebulization as needed for wheezing or shortness of breath.)   aspirin 81 MG EC tablet Take 1 tablet (81 mg total) by mouth daily. Swallow whole.   atorvastatin (LIPITOR) 80 MG tablet TAKE 1 TABLET DAILY AT 6 P.M. (KEEP UPCOMING APPOINTMENT FOR FUTURE REFILLS   carvedilol (COREG) 3.125 MG tablet TAKE 1 TABLET TWICE A DAY WITH MEALS   estrogens, conjugated, (PREMARIN) 1.25 MG tablet Take 1.25 mg by mouth daily.   feeding supplement (ENSURE ENLIVE / ENSURE PLUS) LIQD Take 237 mLs by mouth 2 (two) times daily between meals.   furosemide (LASIX) 40 MG tablet TAKE ONE-HALF (1/2) TABLET (20 MG TOTAL) DAILY (KEEP UPCOMING APPOINTMENT FOR FUTURE REFILLS)   hydrOXYzine (ATARAX/VISTARIL) 25 MG tablet Take 0.5 tablets (12.5 mg total) by mouth every 8 (eight) hours as needed for itching. (Patient taking differently: Take 12.5 mg by mouth as needed for  itching.)   ipratropium-albuterol (DUONEB) 0.5-2.5 (3) MG/3ML SOLN Take 3 mLs by nebulization every 6 (six) hours as needed.   JARDIANCE 10 MG TABS tablet TAKE 1 TABLET DAILY   lamoTRIgine (LAMICTAL) 100 MG tablet Take 1 tablet (100 mg total) by mouth 2 (two) times daily.   levothyroxine (SYNTHROID) 25 MCG tablet Take 1 tablet (25 mcg total) by mouth daily before breakfast.   losartan (COZAAR) 50 MG tablet TAKE ONE-HALF (1/2) TABLET DAILY   potassium chloride SA (KLOR-CON M) 20 MEQ tablet TAKE 1 TABLET DAILY (KEEP UPCOMING APPOINTMENT FOR FUTURE REFILLS)   progesterone (PROMETRIUM) 200 MG capsule Take 200 mg by mouth at bedtime.   omeprazole (PRILOSEC) 20 MG capsule Take 1 capsule (20 mg total) by mouth daily.   No facility-administered medications prior to visit.    ROS Negative unless otherwise noted in HPI   Objective:     BP 135/70 (BP Location: Left Arm, Patient Position: Sitting, Cuff Size: Normal)   Pulse 61   Resp 18   Ht 5\' 1"  (1.549 m)   Wt 76 lb (34.5 kg)   SpO2 100%   BMI 14.36 kg/m   Physical Exam Constitutional:      General: She is not in acute distress.    Appearance: Normal appearance.  HENT:     Head: Normocephalic and atraumatic.  Pulmonary:     Effort: Pulmonary effort is normal. No respiratory distress.  Musculoskeletal:     Cervical back: Normal range of motion.  Neurological:     General: No focal  deficit present.     Mental Status: She is alert and oriented to person, place, and time. Mental status is at baseline.  Psychiatric:        Mood and Affect: Mood normal.        Thought Content: Thought content normal.        Judgment: Judgment normal.     Assessment & Plan:  Hypothyroidism, unspecified type Assessment & Plan: TSH within normal limits.  Continue levothyroxine 25 mcg daily.  Will continue to monitor.   Tobacco dependence Assessment & Plan: With increased smoking due to stress, she is interested in smoking cessation.  She is not at  the point where she would like to start medication now, but she would like to know more about what the options are.  Provided additional information about Chantix and Wellbutrin and will discuss at her next appointment further.   Discussed recent lab results.  Within normal limits with the exception of A1c increased to 5.9, elevated AST and ALT at 46 and 40.  Discussed limiting use of Tylenol, alcohol.  Denies recent changes in medication, recheck liver function in about 6 weeks.  Return in about 6 weeks (around 07/25/2023) for follow-up for stress, smoking cessation, repeat liver labs (nonfasting).    Christie Quitter, PA

## 2023-06-16 ENCOUNTER — Other Ambulatory Visit: Payer: Self-pay

## 2023-06-16 ENCOUNTER — Ambulatory Visit: Payer: Self-pay | Admitting: *Deleted

## 2023-06-16 NOTE — Patient Instructions (Signed)
Visit Information  Thank you for taking time to visit with me today. Please don't hesitate to contact me if I can be of assistance to you.   Following are the goals we discussed today:   Goals Addressed             This Visit's Progress    Reduce symptoms related to stress and depression       Activities and task to complete in order to accomplish goals.   Complete paperwork once received by mail and return to TTC office for initial intake schedulingCall or go to Department of Social Services to inquire about food stamps and other financial support (bills) if needed Consider local food pantry sites to help supplement your food needs- [facility] (select "find help") - Find Food  [place], [place] (ghpfa.org) or download the free app to your smart phone Food and Nutrition Services - [phone] or apply online at [facility] - ePASS United Way Referral Service Dial [phone] or [phone] to speak with a call specialist.   Follow up on food resources discussed Start / continue relaxed breathing 3 times daily Keep all upcoming appointment discussed today Consider local hospice agency for free grief and loss support and counseling         Our next appointment is by telephone on 07/14/23   Please call the care guide team at [phone] if you need to cancel or reschedule your appointment.   If you are experiencing a Mental Health or Behavioral Health Crisis or need someone to talk to, please call the Suicide and Crisis Lifeline: 988 call 911   The patient verbalized understanding of instructions, educational materials, and care plan provided today and DECLINED offer to receive copy of patient instructions, educational materials, and care plan.   Telephone follow up appointment with care management team member scheduled for:07/14/23  Reece Levy, MSW, LCSW [facility]  [facility], [facility] Licensed Clinical Social  Worker Care Coordinator  [phone]

## 2023-06-20 ENCOUNTER — Other Ambulatory Visit: Payer: Self-pay

## 2023-06-20 ENCOUNTER — Other Ambulatory Visit (INDEPENDENT_AMBULATORY_CARE_PROVIDER_SITE_OTHER): Payer: Self-pay

## 2023-06-20 DIAGNOSIS — G40209 Localization-related (focal) (partial) symptomatic epilepsy and epileptic syndromes with complex partial seizures, not intractable, without status epilepticus: Secondary | ICD-10-CM

## 2023-06-20 DIAGNOSIS — Z0289 Encounter for other administrative examinations: Secondary | ICD-10-CM

## 2023-06-22 LAB — LAMOTRIGINE LEVEL: Lamotrigine Lvl: 8 ug/mL (ref 2.0–20.0)

## 2023-06-24 ENCOUNTER — Other Ambulatory Visit: Payer: Self-pay | Admitting: Family Medicine

## 2023-06-24 DIAGNOSIS — E039 Hypothyroidism, unspecified: Secondary | ICD-10-CM

## 2023-06-24 MED ORDER — LEVOTHYROXINE SODIUM 25 MCG PO TABS: 25.0000 ug | ORAL_TABLET | Freq: Every day | ORAL | 3 refills | Status: DC

## 2023-06-24 NOTE — Telephone Encounter (Signed)
She will need to take levothyroxine long-term because her thyroid does not function properly and make enough on its own.  I have sent refills on the levothyroxine for her.

## 2023-06-24 NOTE — Telephone Encounter (Signed)
Copied from CRM [id number]. Topic: Clinical - Medication Question >> Jun 24, 2023 10:16 AM Almira Coaster wrote: Reason for CRM: Patient has a question regarding her levothyroxine (SYNTHROID) 25 MCG tablet, She finished the medication and would like to know if she needs to continue taking it or not.

## 2023-06-24 NOTE — Addendum Note (Signed)
Addended by: Saralyn Pilar on: 06/24/2023 11:40 AM   Modules accepted: Orders

## 2023-06-24 NOTE — Telephone Encounter (Signed)
Called pt she is advised of her Rx it will be sent to [facility] on [facility]

## 2023-07-07 ENCOUNTER — Other Ambulatory Visit: Payer: Self-pay | Admitting: Obstetrics and Gynecology

## 2023-07-07 DIAGNOSIS — N6320 Unspecified lump in the left breast, unspecified quadrant: Secondary | ICD-10-CM

## 2023-07-09 ENCOUNTER — Telehealth: Payer: Self-pay | Admitting: Cardiology

## 2023-07-09 ENCOUNTER — Emergency Department (HOSPITAL_COMMUNITY)
Admission: EM | Admit: 2023-07-09 | Discharge: 2023-07-10 | Disposition: A | Discharge status: Home / Self Care | Payer: Medicare Other | Attending: Emergency Medicine | Admitting: Emergency Medicine

## 2023-07-09 ENCOUNTER — Other Ambulatory Visit: Payer: Self-pay

## 2023-07-09 ENCOUNTER — Encounter (HOSPITAL_COMMUNITY): Payer: Self-pay | Admitting: *Deleted

## 2023-07-09 DIAGNOSIS — R600 Localized edema: Secondary | ICD-10-CM | POA: Insufficient documentation

## 2023-07-09 DIAGNOSIS — Z7982 Long term (current) use of aspirin: Secondary | ICD-10-CM | POA: Diagnosis not present

## 2023-07-09 DIAGNOSIS — F17210 Nicotine dependence, cigarettes, uncomplicated: Secondary | ICD-10-CM | POA: Diagnosis not present

## 2023-07-09 DIAGNOSIS — R072 Precordial pain: Secondary | ICD-10-CM | POA: Diagnosis not present

## 2023-07-09 DIAGNOSIS — I509 Heart failure, unspecified: Secondary | ICD-10-CM | POA: Diagnosis not present

## 2023-07-09 DIAGNOSIS — M7989 Other specified soft tissue disorders: Secondary | ICD-10-CM | POA: Diagnosis present

## 2023-07-09 NOTE — ED Triage Notes (Signed)
The pt is c/o both her feet and legs swelling for weeks  chest pain intermittent  she has been told that she has acid reflux but she does not think that is the problem

## 2023-07-09 NOTE — Telephone Encounter (Signed)
Patient called the answering service this evening concerned that she has been having intermittent episodes of chest pain for the past 2 days.  Pain is located on the left side of her chest.  It is somewhat difficult for her to describe, reports that this is new for her.  She also notes swelling in her ankles that is worse than usual and has a headache.   Discussed that I cannot do a full evaluation over the phone, which makes it difficult to assess the cause of her chest pain. Discussed that for an accurate diagnosis, she will need to get an EKG, labs, and a full physical exam. Recommended that she go to an Emergency department for further evaluation. Patient has someone to drive her to the ED. She voiced understanding and agreement.   Jonita Albee, PA-C 07/09/2023 5:08 PM

## 2023-07-10 ENCOUNTER — Emergency Department (HOSPITAL_COMMUNITY): Payer: Medicare Other

## 2023-07-10 DIAGNOSIS — R600 Localized edema: Secondary | ICD-10-CM | POA: Diagnosis not present

## 2023-07-10 LAB — BASIC METABOLIC PANEL
Anion gap: 10 (ref 5–15)
BUN: 18 mg/dL (ref 8–23)
CO2: 25 mmol/L (ref 22–32)
Calcium: 9.3 mg/dL (ref 8.9–10.3)
Chloride: 99 mmol/L (ref 98–111)
Creatinine, Ser: 1.06 mg/dL — ABNORMAL HIGH (ref 0.44–1.00)
GFR, Estimated: 57 mL/min — ABNORMAL LOW (ref >60)
Glucose, Bld: 117 mg/dL — ABNORMAL HIGH (ref 70–99)
Potassium: 4.2 mmol/L (ref 3.5–5.1)
Sodium: 134 mmol/L — ABNORMAL LOW (ref 135–145)

## 2023-07-10 LAB — CBC
HCT: 41.4 % (ref 36.0–46.0)
Hemoglobin: 13.7 g/dL (ref 12.0–15.0)
MCH: 30.6 pg (ref 26.0–34.0)
MCHC: 33.1 g/dL (ref 30.0–36.0)
MCV: 92.4 fL (ref 80.0–100.0)
Platelets: 156 10*3/uL (ref 150–400)
RBC: 4.48 MIL/uL (ref 3.87–5.11)
RDW: 14.6 % (ref 11.5–15.5)
WBC: 9.3 10*3/uL (ref 4.0–10.5)
nRBC: 0 % (ref 0.0–0.2)

## 2023-07-10 LAB — TROPONIN I (HIGH SENSITIVITY)
Troponin I (High Sensitivity): 19 ng/L — ABNORMAL HIGH (ref <18)
Troponin I (High Sensitivity): 21 ng/L — ABNORMAL HIGH (ref <18)

## 2023-07-10 LAB — BRAIN NATRIURETIC PEPTIDE: B Natriuretic Peptide: 667 pg/mL — ABNORMAL HIGH (ref 0.0–100.0)

## 2023-07-10 NOTE — ED Provider Notes (Signed)
[place] EMERGENCY DEPARTMENT AT [facility] Provider Note   CSN: [id number] Arrival date & time: 07/09/23  2328     History  Chief Complaint  Patient presents with   Chest Pain   Leg Swelling    Christie Moreno is a 67 y.o. female.  The history is provided by the patient and a significant other.  Patient with history of aortic stenosis and CHF presents with multiple complaints.  Patient reports for several days she has had increasing swelling in both legs.  No discoloration or new numbness in her feet.  There is mild pain in her feet.  She also reports over the past day she has had intermittent episodes of left-sided chest pain.  She has a hard time describing this pain, though at times it may feel sharp.  It does not radiate to the back.  No new shortness of breath.  No fevers or vomiting.  Patient reports medication compliance including with her furosemide.  Patient does still smoke cigarettes. No exertional chest pain is reported.  No syncope   Past Medical History:  Diagnosis Date   Heart failure (HCC)    Seizures (HCC)    none in years, recently had medication change    Home Medications Prior to Admission medications   Medication Sig Start Date End Date Taking? Authorizing Provider  albuterol (PROVENTIL) (2.5 MG/3ML) 0.083% nebulizer solution Take 3 mLs (2.5 mg total) by nebulization every 6 (six) hours as needed for wheezing or shortness of breath. Patient taking differently: Take 2.5 mg by nebulization as needed for wheezing or shortness of breath. 11/06/21   Arrien, York Ram, MD  aspirin 81 MG EC tablet Take 1 tablet (81 mg total) by mouth daily. Swallow whole. 11/06/21   Arrien, York Ram, MD  atorvastatin (LIPITOR) 80 MG tablet TAKE 1 TABLET DAILY AT 6 P.M. (KEEP UPCOMING APPOINTMENT FOR FUTURE REFILLS 05/11/23   Runell Gess, MD  carvedilol (COREG) 3.125 MG tablet TAKE 1 TABLET TWICE A DAY WITH MEALS 01/03/23   Joylene Grapes, NP   estrogens, conjugated, (PREMARIN) 1.25 MG tablet Take 1.25 mg by mouth daily.    [provider]  feeding supplement (ENSURE ENLIVE / ENSURE PLUS) LIQD Take 237 mLs by mouth 2 (two) times daily between meals. 11/06/21   Arrien, York Ram, MD  furosemide (LASIX) 40 MG tablet TAKE ONE-HALF (1/2) TABLET (20 MG TOTAL) DAILY (KEEP UPCOMING APPOINTMENT FOR FUTURE REFILLS) 05/31/23   Runell Gess, MD  hydrOXYzine (ATARAX/VISTARIL) 25 MG tablet Take 0.5 tablets (12.5 mg total) by mouth every 8 (eight) hours as needed for itching. Patient taking differently: Take 12.5 mg by mouth as needed for itching. 02/11/21   Wallis Bamberg, PA-C  ipratropium-albuterol (DUONEB) 0.5-2.5 (3) MG/3ML SOLN Take 3 mLs by nebulization every 6 (six) hours as needed. 11/06/21   Arrien, York Ram, MD  JARDIANCE 10 MG TABS tablet TAKE 1 TABLET DAILY 01/07/23   Runell Gess, MD  lamoTRIgine (LAMICTAL) 100 MG tablet Take 1 tablet (100 mg total) by mouth 2 (two) times daily. 04/14/23   Glean Salvo, NP  levothyroxine (SYNTHROID) 25 MCG tablet Take 1 tablet (25 mcg total) by mouth daily before breakfast. 06/24/23   Melida Quitter, PA  losartan (COZAAR) 50 MG tablet TAKE ONE-HALF (1/2) TABLET DAILY 01/24/23   Runell Gess, MD  omeprazole (PRILOSEC) 20 MG capsule Take 1 capsule (20 mg total) by mouth daily. 04/13/23 06/12/23  Melida Quitter, PA  potassium chloride SA (KLOR-CON M) 20 MEQ tablet TAKE 1 TABLET DAILY (KEEP UPCOMING APPOINTMENT FOR FUTURE REFILLS) 02/28/23   Corrin Parker, PA-C  progesterone (PROMETRIUM) 200 MG capsule Take 200 mg by mouth at bedtime. 04/20/22   [provider]      Allergies    Azithromycin, Erythromycin, Percocet [oxycodone-acetaminophen], and Penicillins    Review of Systems   Review of Systems  Constitutional:  Negative for fever.  Respiratory:  Negative for shortness of breath.   Cardiovascular:  Positive for chest pain and leg swelling.   Gastrointestinal:  Negative for vomiting.  Neurological:  Negative for syncope.    Physical Exam Updated Vital Signs BP (!) 133/49   Pulse 62   Temp 98.1 F (36.7 C) (Oral)   Resp 19   Ht 1.549 m (5\' 1" )   Wt 34.5 kg   SpO2 100%   BMI 14.37 kg/m  Physical Exam CONSTITUTIONAL: Thin appearing, no acute distress HEAD: Normocephalic/atraumatic ENMT: Mucous membranes moist NECK: supple no meningeal signs CV: S1/S2 noted, no loud harsh murmur LUNGS: Lungs are clear to auscultation bilaterally, no apparent distress Chest-no tenderness to palpation ABDOMEN: soft, nontender NEURO: Pt is awake/alert/appropriate, moves all extremitiesx4.  No facial droop.   EXTREMITIES: pulses normal/equal, full ROM Symmetric 1+ pitting edema to bilateral lower extremities.  No discoloration. SKIN: warm, color normal PSYCH: no abnormalities of mood noted, alert and oriented to situation  ED Results / Procedures / Treatments   Labs (all labs ordered are listed, but only abnormal results are displayed) Labs Reviewed  BASIC METABOLIC PANEL - Abnormal; Notable for the following components:      Result Value   Sodium 134 (*)    Glucose, Bld 117 (*)    Creatinine, Ser 1.06 (*)    GFR, Estimated 57 (*)    All other components within normal limits  BRAIN NATRIURETIC PEPTIDE - Abnormal; Notable for the following components:   B Natriuretic Peptide 667.0 (*)    All other components within normal limits  TROPONIN I (HIGH SENSITIVITY) - Abnormal; Notable for the following components:   Troponin I (High Sensitivity) 21 (*)    All other components within normal limits  TROPONIN I (HIGH SENSITIVITY) - Abnormal; Notable for the following components:   Troponin I (High Sensitivity) 19 (*)    All other components within normal limits  CBC    EKG EKG Interpretation Date/Time:  Sunday July 10 2023 03:52:45 EST Ventricular Rate:  62 PR Interval:  179 QRS Duration:  99 QT Interval:  417 QTC  Calculation: 424 R Axis:   249  Text Interpretation: Sinus rhythm Left anterior fascicular block Probable lateral infarct, old No significant change since last tracing Confirmed by Zadie Rhine ([id number]) on 07/10/2023 3:56:13 AM  Radiology DG Chest 2 View  Result Date: 07/10/2023 CLINICAL DATA:  Chest pain and peripheral swelling, initial encounter EXAM: CHEST - 2 VIEW COMPARISON:  11/02/2021 FINDINGS: Cardiac shadow is within normal limits. Aortic calcifications are seen. The lungs are hyperinflated clear. No bony abnormality is noted. IMPRESSION: Hyperinflation without acute abnormality. Electronically Signed   By: Alcide Clever M.D.   On: 07/10/2023 00:29    Procedures Procedures    Medications Ordered in ED Medications - No data to display  ED Course/ Medical Decision Making/ A&P Clinical Course as of 07/10/23 0411  Sun Jul 10, 2023  0408 Patient with history of aortic stenosis, CHF presents with multiple complaints.  She reports has had increasing lower extremity edema  for several days.  She also reports random intermittent episodes of left-sided chest pain that she has difficulty describing.  Last episode was around 1600 on December 7 She reports it lasted a few minutes and was at rest and had no other associated symptoms.  Patient has had no chest pain here in the ER. She is well-appearing and requesting coffee and food [DW]  0409 Minimally elevated troponin though unlikely an acute issue.  Mildly elevated BNP but her x-ray is clear and she has no signs of decompensated heart failure.  She does have some increased lower extremity edema.  [DW]  [phone] Patient is very thin at baseline and only takes 20 mg of Lasix daily.  Will have her take 20 mg twice daily and help arrange close cardiology follow-up.  We discussed strict ER return precautions.  Patient has no signs or symptoms of aortic dissection or PE [DW]    Clinical Course User Index [DW] Zadie Rhine, MD                                  Medical Decision Making Amount and/or Complexity of Data Reviewed Labs: ordered. Radiology: ordered.   This patient presents to the ED for concern of chest pain, this involves an extensive number of treatment options, and is a complaint that carries with it a high risk of complications and morbidity.  The differential diagnosis includes but is not limited to acute coronary syndrome, aortic dissection, pulmonary embolism, pericarditis, pneumothorax, pneumonia, myocarditis, pleurisy, esophageal rupture   Comorbidities that complicate the patient evaluation: Patient's presentation is complicated by their history of aortic stenosis, nonobstructive CAD  Social Determinants of Health: Patient's  tobacco use   increases the complexity of managing their presentation  Additional history obtained: Additional history obtained from significant other Records reviewed  cardiology notes  Lab Tests: I Ordered, and personally interpreted labs.  The pertinent results include: Mildly elevated BNP  Imaging Studies ordered: I ordered imaging studies including X-ray chest   I independently visualized and interpreted imaging which showed no pulmonary edema I agree with the radiologist interpretation  Cardiac Monitoring: The patient was maintained on a cardiac monitor.  I personally viewed and interpreted the cardiac monitor which showed an underlying rhythm of:  sinus rhythm   Reevaluation: After the interventions noted above, I reevaluated the patient and found that they have :improved  Complexity of problems addressed: Patient's presentation is most consistent with  acute presentation with potential threat to life or bodily function  Disposition: After consideration of the diagnostic results and the patient's response to treatment,  I feel that the patent would benefit from discharge   .           Final Clinical Impression(s) / ED Diagnoses Final diagnoses:  Peripheral  edema  Precordial pain    Rx / DC Orders ED Discharge Orders          Ordered    Ambulatory referral to Cardiology        07/10/23 0411              Zadie Rhine, MD 07/10/23 0413

## 2023-07-10 NOTE — Discharge Instructions (Signed)

## 2023-07-11 ENCOUNTER — Telehealth: Payer: Self-pay | Admitting: Cardiovascular Disease

## 2023-07-11 NOTE — Telephone Encounter (Signed)
Pt states she missed her morning dose of furosemide yesterday and asked when she should take it again. Please advise.

## 2023-07-11 NOTE — Telephone Encounter (Signed)
Spoke with pt, aware she can get back on schedule today with the furosemide.

## 2023-07-12 ENCOUNTER — Telehealth: Payer: Self-pay | Admitting: Physician Assistant

## 2023-07-12 NOTE — Telephone Encounter (Signed)
Inbound call from patient requesting to be seen sooner than 12/17. States she is having a significant amount pain in her abdomen. Requesting to speak with a nurse further. Please advise, thank you.

## 2023-07-13 NOTE — Telephone Encounter (Signed)
Pt was seen in the ER recently for what she thought was cardiac chest pain but no cardiac cause was found. Pt states they did not address the RLQ abd pain that she has been having for several months. Pt wanting to be seen prior to 12/17, discussed with pt that there is no sooner appt available and she could try seeing her PCP or urgent care if she needed to be seen prior to 12/17. Pt verbalized understanding.

## 2023-07-14 ENCOUNTER — Ambulatory Visit: Payer: Self-pay | Admitting: Licensed Clinical Social Worker

## 2023-07-14 ENCOUNTER — Telehealth: Payer: Self-pay | Admitting: Neurology

## 2023-07-14 ENCOUNTER — Encounter: Payer: Self-pay | Admitting: *Deleted

## 2023-07-14 NOTE — Telephone Encounter (Signed)
Pt called to get lab results. Requesting call back

## 2023-07-14 NOTE — Telephone Encounter (Signed)
Called and relayed the following result:   Christie Moreno, Lamictal level is good within therapeutic range. Please let me know if you have any questions. Thanks, Sarah    Pt voiced gratitude and understanding

## 2023-07-14 NOTE — Patient Outreach (Signed)
  Care Coordination   Follow Up Visit Note   07/14/2023 Name: Christie Moreno MRN: [id number] DOB: 1956-04-20  Christie Moreno is a 67 y.o. year old female who sees Melida Quitter, [place] for primary care. I spoke with  Christie Moreno by phone today.  What matters to the patients health and wellness today?  I have so many doctor's appointment coming up. I would like to get a better handle on my stress.    Goals Addressed             This Visit's Progress    Reduce symptoms related to stress and depression       Activities and task to complete in order to accomplish goals.   Complete paperwork once received by mail and return to TTC office for initial intake scheduling - make part of morning routine. Call or go to Department of Social Services to inquire about food stamps and other financial support (bills) if needed Consider local food pantry sites to help supplement your food needs- [facility] (select "find help") - Find Food  [place], [place] (ghpfa.org) or download the free app to your smart phone Food and Nutrition Services - ([phone] or apply online at [facility] - ePASS United Way Referral Service Dial [phone] or [phone] to speak with a call specialist.   Follow up on food resources discussed Start / continue relaxed breathing 3 times daily Attend all upcoming appointment discussed today Consider local hospice agency for free grief and loss support and counseling Utilize food resources that are available to patient.         SDOH assessments and interventions completed:  Yes     Care Coordination Interventions:  Yes, provided   Interventions Today    Flowsheet Row Most Recent Value  Chronic Disease   Chronic disease during today's visit Other  [Anxiety]  General Interventions   General Interventions Discussed/Reviewed General Interventions Discussed, Community Resources  [Patient encouraged to complete Transitions  Therapeutic care paperwork and return to office for intake. Discussed possible benefits of therapy and scheduling time to complete paperwork.]  Exercise Interventions   Exercise Discussed/Reviewed Exercise Discussed, Physical Activity  [Patient would like to increase her physical activity, we discussed ways she can do this, including doing extra laps down her hall when walking. Also went over [facility] benefits.]  Mental Health Interventions   Mental Health Discussed/Reviewed Mental Health Discussed, Coping Strategies, Anxiety  [Discussed multiple upcoming doctors appts and new possible medical conditions. We discussed stress from medical conditions and upcoming holidays. Reviewed her coping strategies and encouraged her to continue practicing these.]        Follow up plan: Follow up call scheduled for 08/10/2022    Encounter Outcome:  Patient Visit Completed

## 2023-07-14 NOTE — Patient Instructions (Signed)
Visit Information  Thank you for taking time to visit with me today. Please don't hesitate to contact me if I can be of assistance to you.   Following are the goals we discussed today:   Goals Addressed             This Visit's Progress    Reduce symptoms related to stress and depression       Activities and task to complete in order to accomplish goals.   Complete paperwork once received by mail and return to [facility] office for initial intake scheduling - make part of morning routine. Call or go to Department of Social Services to inquire about food stamps and other financial support (bills) if needed Consider local food pantry sites to help supplement your food needs- [facility] (select "find help") - Find Food  [place], [place] (ghpfa.org) or download the free app to your smart phone Food and Nutrition Services - ([phone] or apply online at [facility] - ePASS [employer] Referral Service Dial [phone] or [phone] to speak with a call specialist.   Follow up on food resources discussed Start / continue relaxed breathing 3 times daily Attend all upcoming appointment discussed today Consider local hospice agency for free grief and loss support and counseling Utilize food resources that are available to patient.         Our next appointment is by telephone on 08/11/2023 at 9:30  Please call the care guide team at [phone] if you need to cancel or reschedule your appointment.   If you are experiencing a Mental Health or Behavioral Health Crisis or need someone to talk to, please call the Suicide and Crisis Lifeline: 988  Patient verbalizes understanding of instructions and care plan provided today and agrees to view in MyChart. Active MyChart status and patient understanding of how to access instructions and care plan via MyChart confirmed with patient.     Telephone follow up appointment with care management team member scheduled for:  08/11/2023

## 2023-07-19 ENCOUNTER — Encounter: Payer: Self-pay | Admitting: Internal Medicine

## 2023-07-19 ENCOUNTER — Inpatient Hospital Stay (HOSPITAL_BASED_OUTPATIENT_CLINIC_OR_DEPARTMENT_OTHER)
Admission: RE | Admit: 2023-07-19 | Discharge: 2023-07-19 | Disposition: A | Discharge status: Home / Self Care | Payer: Medicare Other | Source: Ambulatory Visit | Attending: Internal Medicine | Admitting: Internal Medicine

## 2023-07-19 ENCOUNTER — Ambulatory Visit: Payer: Medicare Other | Admitting: Internal Medicine

## 2023-07-19 VITALS — BP 100/64 | HR 84 | Ht 61.0 in | Wt 74.2 lb

## 2023-07-19 DIAGNOSIS — R194 Change in bowel habit: Secondary | ICD-10-CM

## 2023-07-19 DIAGNOSIS — R1033 Periumbilical pain: Secondary | ICD-10-CM

## 2023-07-19 DIAGNOSIS — R110 Nausea: Secondary | ICD-10-CM

## 2023-07-19 DIAGNOSIS — R198 Other specified symptoms and signs involving the digestive system and abdomen: Secondary | ICD-10-CM | POA: Insufficient documentation

## 2023-07-19 DIAGNOSIS — R1013 Epigastric pain: Secondary | ICD-10-CM

## 2023-07-19 DIAGNOSIS — Z8719 Personal history of other diseases of the digestive system: Secondary | ICD-10-CM | POA: Diagnosis not present

## 2023-07-19 DIAGNOSIS — K551 Chronic vascular disorders of intestine: Secondary | ICD-10-CM | POA: Diagnosis not present

## 2023-07-19 MED ORDER — IOHEXOL 300 MG/ML  SOLN
100.0000 mL | Freq: Once | INTRAMUSCULAR | Status: AC | PRN
  Administered 2023-07-19: 75 mL via INTRAVENOUS

## 2023-07-19 NOTE — Patient Instructions (Signed)
You have been scheduled for a CT scan of the abdomen and pelvis at [facility], 1st floor Radiology. You are scheduled on 07/19/23 at 4:15 pm. You should arrive 15 minutes prior to your appointment time for registration.  You may take any medications as prescribed with a small amount of water, if necessary. If you take any of the following medications: METFORMIN, GLUCOPHAGE, GLUCOVANCE, AVANDAMET, RIOMET, FORTAMET, ACTOPLUS MET, JANUMET, GLUMETZA or METAGLIP, you MAY be asked to HOLD this medication 48 hours AFTER the exam.   The purpose of you drinking the oral contrast is to aid in the visualization of your intestinal tract. The contrast solution may cause some diarrhea. Depending on your individual set of symptoms, you may also receive an intravenous injection of x-ray contrast/dye. Plan on being at [facility] for 45 minutes or longer, depending on the type of exam you are having performed.   If you have any questions regarding your exam or if you need to reschedule, you may call [facility] at [phone] between the hours of 8:00 am and 5:00 pm, Monday-Friday.   [facility] Address: [address], [place], [place] [address]  Take Miralax 2 times daily in 8 ounces of water __________________________________________  Christie Moreno have been scheduled for an endoscopy. Please follow written instructions given to you at your visit today.  If you use inhalers (even only as needed), please bring them with you on the day of your procedure.  If you take any of the following medications, they will need to be adjusted prior to your procedure:   DO NOT TAKE 7 DAYS PRIOR TO TEST- Trulicity (dulaglutide) Ozempic, Wegovy (semaglutide) Mounjaro (tirzepatide) Bydureon Bcise (exanatide extended release)  DO NOT TAKE 1 DAY PRIOR TO YOUR TEST Rybelsus (semaglutide) Adlyxin (lixisenatide) Victoza (liraglutide) Byetta  (exanatide) ___________________________________________________________________________    If your blood pressure at your visit was 140/90 or greater, please contact your primary care physician to follow up on this.  _______________________________________________________  If you are age 69 or older, your body mass index should be between 23-30. Your Body mass index is 14.03 kg/m. If this is out of the aforementioned range listed, please consider follow up with your Primary Care Provider.  If you are age 69 or younger, your body mass index should be between 19-25. Your Body mass index is 14.03 kg/m. If this is out of the aformentioned range listed, please consider follow up with your Primary Care Provider.   ________________________________________________________  The [facility] providers would like to encourage you to use [facility] to communicate with providers for non-urgent requests or questions.  Due to long hold times on the telephone, sending your provider a message by [facility] may be a faster and more efficient way to get a response.  Please allow 48 business hours for a response.  Please remember that this is for non-urgent requests.  _______________________________________________________   Due to recent changes in healthcare laws, you may see the results of your imaging and laboratory studies on MyChart before your provider has had a chance to review them.  We understand that in some cases there may be results that are confusing or concerning to you. Not all laboratory results come back in the same time frame and the provider may be waiting for multiple results in order to interpret others.  Please give [place] 48 hours in order for your provider to thoroughly review all the results before contacting the office for clarification of your results.  Thank you for entrusting me with your care and for choosing [facility], Dr. Eulah Pont

## 2023-07-19 NOTE — Progress Notes (Signed)
Plan to get    07/19/2023 KATHEY SPICER [id number] 02-Apr-1956  Referring provider: Melida Quitter, PA Primary GI doctor: Dr. 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  She is tender in the epigastric area currently.  Difficult for her to be able to figure out if anything is helping with her pain.  Prior CT imaging may suggest some issues with constipation.  Will plan for another CT scan since it has been about 1.5 years since her last CT scan to see if there is another explanation for her abdominal pain.  Will also get her scheduled for an EGD to look for any sources of abdominal pain in her esophagus, stomach, or small bowel.  In the meantime patient will try IBgard and increase her MiraLAX to see if this helps with her symptoms.  Patient on her last colonoscopy was noted to have significant formation of the cecum, which was attributed to NSAIDs, ischemia, early IBD, or infection.  I encouraged her to stay well-hydrated to try to prevent ischemia if possible.  Pending the results of her EGD can decide if PPI use should be tried - Check CT A/P w/contrast urgent at [facility] - Stay well hydrated - Try the IBGard again - Try 2 doses of Miralax per day - EGD [facility] - RTC 3 months    Patient Care Team: Melida Quitter, PA as PCP - General (Family Medicine) Runell Gess, MD as PCP - Cardiology (Cardiology) Lillia Mountain, LCSW as Social Worker (Licensed Clinical Social Worker)  HISTORY OF PRESENT ILLNESS: 67 y.o. female with a past medical history of seizures, coronary disease, tobacco use and others listed below presents for follow-up of abdominal pain    Interval History: Ab pain has been getting worse over time. She has been taking fiber supplements, which did  not help. She tried Miralax in the past, which did not help. Then she restarted her Miralax two days ago and is waiting to see if this does anything. She tried IBGard for 4 days. She is not sure if it helps, but she did not think that she gave it long enough work. She is planning to restart taking IBGard. Occasional nausea. Denies vomiting. She has had 2 BMs over the last 3 days. Her ab pain is located around the belly button. Denies chest burning or regurgitation. Denies dysphagia except for the large pills. Denies NSAID use.  Patient does think that she may not be drinking enough liquids.  She has about 1 to 2 cups of coffee per day.  History of Present Illness          Wt Readings from Last 8 Encounters:  07/19/23 74 lb 4 oz (33.7 kg)  07/09/23 76 lb 0.9 oz (34.5 kg)  06/13/23 76 lb (34.5 kg)  05/06/23 78 lb 3.2 oz (35.5 kg)  04/14/23 81 lb 9.6 oz (37 kg)  04/13/23 80 lb (36.3 kg)  04/05/23 79 lb 3.2 oz (35.9 kg)  03/17/23 81 lb (36.7 kg)   CT A/P w/o contrast 11/03/21: IMPRESSION: Small bilateral pleural effusions.  Bibasilar atelectasis. Coronary artery disease.  Aortic atherosclerosis. No definite acute process in the abdomen or pelvis.   RUQ U/S 11/03/21: IMPRESSION: 1. Unremarkable sonographic appearance of the liver. 2. Minimal layering sludge within the gallbladder.  No gallstone or evidence of cholecystitis.   TTE 03/02/22: IMPRESSIONS   1. Left ventricular ejection fraction, by estimation, is 50 to 55%. The  left ventricle has low normal function. The left ventricle has no regional  wall motion abnormalities. Left ventricular diastolic parameters are  indeterminate. Elevated left atrial pressure.   2. Right ventricular systolic function is normal. The right ventricular  size is normal. There is normal pulmonary artery systolic pressure.   3. Left atrial size was mild to moderately dilated.   4. The mitral valve is degenerative. Mild mitral valve regurgitation.  Mild mitral  stenosis. The mean mitral valve gradient is 3.0 mmHg.   5. Functionally bicuspid. The left and non-coronary cusps are essentially  fused. The aortic valve is calcified. There is severe calcifcation of the  aortic valve. There is severe thickening of the aortic valve. Aortic valve  regurgitation is mild to  moderate. Mild aortic valve stenosis. Aortic regurgitation PHT measures  414 msec. Aortic valve area, by VTI measures 1.03 cm. Aortic valve mean  gradient measures 12.0 mmHg. Aortic valve Vmax measures 2.34 m/s.   6. The inferior vena cava is normal in size with greater than 50%  respiratory variability, suggesting right atrial pressure of 3 mmHg.    Colonoscopy 07/19/22: - The examined portion of the ileum was normal. - Localized severe inflammation was found in the cecum. Biopsied. - One 3 mm polyp in the rectum, removed with a cold snare. Resected and retrieved. - Non-bleeding internal hemorrhoids. Path: 1. Surgical [P], right colon FOCAL ACUTE COLITIS WITH EROSION/ULCERATION NEGATIVE FOR DYSPLASIA AND GRANULOMAS 2. Surgical [P], left colon FOCAL ACUTE COLITIS/CRYPTITIS 3. Surgical [P], colon, rectum, polyp (1) HYPERPLASTIC POLYP WITH CHANGES OF MUCOSAL PROLAPSE NEGATIVE FOR DYSPLASIA AND CARCINOMA Microscopic Comment 1. -2. The right and left colon biopsies show multiple fragments of colonic mucosa. Within the right colon biopsies several fragments exhibit mild architectural distortion in the form of elongated gated tortuous and branching crypts within a lamina propria showing an increased and focally mixed mononuclear cell infiltrate including neutrophils which infiltrate the epithelium. Within 1 fragment of the lamina propria is hyalinized with overlying surface erosion suggestive of a chronic ulcer. The left colon biopsies exhibit a normal crypt architecture with a patchy variable increased mononuclear cell infiltrate including neutrophils. Focally these neutrophils infiltrate  the crypt epithelium. In both of the biopsies there is no increase in intraepithelial lymphocytes and the collagen table is of normal thickness. No granulomas or parasites are seen. And there is no evidence of dysplasia or carcinoma. Within the right colon biopsies architectural distortion is felt to be related to the erosion/ulceration and is otherwise nonspecific. These changes are not definitive for chronicity but evolving chronicity cannot be ruled out. Overall the findings of focal active colitis is commonly associated with a resolving acute self-limited colitis; however, certain drugs particularly NSAIDs may elicit similar changes. Early idiopathic inflammatory bowel disease may also present with these changes although this is not favored. Clinical, microbiologic and endoscopic correlation commended.    She  reports that she has been smoking cigarettes. She started smoking about 53 years ago. She has a 52 pack-year smoking history. She has been exposed to tobacco smoke. She has never used smokeless tobacco. She reports that she does not currently use alcohol. She reports that she does not use drugs.  RELEVANT LABS AND IMAGING:  Results          CBC    Component Value Date/Time   WBC  9.3 07/10/2023 0001   RBC 4.48 07/10/2023 0001   HGB 13.7 07/10/2023 0001   HGB 13.7 06/10/2023 0926   HCT 41.4 07/10/2023 0001   HCT 42.6 06/10/2023 0926   PLT 156 07/10/2023 0001   PLT 171 06/10/2023 0926   MCV 92.4 07/10/2023 0001   MCV 93 06/10/2023 0926   MCH 30.6 07/10/2023 0001   MCHC 33.1 07/10/2023 0001   RDW 14.6 07/10/2023 0001   RDW 13.5 06/10/2023 0926   LYMPHSABS 1.7 06/10/2023 0926   MONOABS 0.4 05/06/2023 1249   EOSABS 0.2 06/10/2023 0926   BASOSABS 0.0 06/10/2023 0926   Recent Labs    05/06/23 1249 06/10/23 0926 07/10/23 0001  HGB 14.3 13.7 13.7    CMP     Component Value Date/Time   NA 134 (L) 07/10/2023 0001   NA 136 06/10/2023 0926   K 4.2 07/10/2023 0001   CL 99  07/10/2023 0001   CO2 25 07/10/2023 0001   GLUCOSE 117 (H) 07/10/2023 0001   BUN 18 07/10/2023 0001   BUN 21 06/10/2023 0926   CREATININE 1.06 (H) 07/10/2023 0001   CALCIUM 9.3 07/10/2023 0001   PROT 6.0 06/10/2023 0926   ALBUMIN 3.9 06/10/2023 0926   AST 46 (H) 06/10/2023 0926   ALT 40 (H) 06/10/2023 0926   ALKPHOS 64 06/10/2023 0926   BILITOT 0.3 06/10/2023 0926   GFRNONAA 57 (L) 07/10/2023 0001   GFRAA 88 04/23/2019 1021      Latest Ref Rng & Units 06/10/2023    9:26 AM 05/06/2023   12:49 PM 06/18/2022    9:05 AM  Hepatic Function  Total Protein 6.0 - 8.5 g/dL 6.0  6.9  6.7   Albumin 3.9 - 4.9 g/dL 3.9  4.0  4.2   AST 0 - 40 IU/L 46  35  34   ALT 0 - 32 IU/L 40  35  46   Alk Phosphatase 44 - 121 IU/L 64  60  71   Total Bilirubin 0.0 - 1.2 mg/dL 0.3  0.5  0.4   Bilirubin, Direct 0.0 - 0.3 mg/dL  0.1        Current Medications:   Current Outpatient Medications (Endocrine & Metabolic):    estrogens, conjugated, (PREMARIN) 1.25 MG tablet, Take 1.25 mg by mouth daily.   JARDIANCE 10 MG TABS tablet, TAKE 1 TABLET DAILY   levothyroxine (SYNTHROID) 25 MCG tablet, Take 1 tablet (25 mcg total) by mouth daily before breakfast.   progesterone (PROMETRIUM) 200 MG capsule, Take 200 mg by mouth at bedtime.  Current Outpatient Medications (Cardiovascular):    atorvastatin (LIPITOR) 80 MG tablet, TAKE 1 TABLET DAILY AT 6 P.M. (KEEP UPCOMING APPOINTMENT FOR FUTURE REFILLS   carvedilol (COREG) 3.125 MG tablet, TAKE 1 TABLET TWICE A DAY WITH MEALS   furosemide (LASIX) 40 MG tablet, TAKE ONE-HALF (1/2) TABLET (20 MG TOTAL) DAILY (KEEP UPCOMING APPOINTMENT FOR FUTURE REFILLS) (Patient taking differently: Take 20 mg by mouth 2 (two) times daily.)   losartan (COZAAR) 50 MG tablet, TAKE ONE-HALF (1/2) TABLET DAILY  Current Outpatient Medications (Respiratory):    albuterol (PROVENTIL) (2.5 MG/3ML) 0.083% nebulizer solution, Take 3 mLs (2.5 mg total) by nebulization every 6 (six) hours as  needed for wheezing or shortness of breath. (Patient taking differently: Take 2.5 mg by nebulization as needed for wheezing or shortness of breath.)   ipratropium-albuterol (DUONEB) 0.5-2.5 (3) MG/3ML SOLN, Take 3 mLs by nebulization every 6 (six) hours as needed. (Patient not taking: Reported on  07/19/2023)  Current Outpatient Medications (Analgesics):    aspirin 81 MG EC tablet, Take 1 tablet (81 mg total) by mouth daily. Swallow whole.   Current Outpatient Medications (Other):    feeding supplement (ENSURE ENLIVE / ENSURE PLUS) LIQD, Take 237 mLs by mouth 2 (two) times daily between meals.   lamoTRIgine (LAMICTAL) 100 MG tablet, Take 1 tablet (100 mg total) by mouth 2 (two) times daily.   potassium chloride SA (KLOR-CON M) 20 MEQ tablet, TAKE 1 TABLET DAILY (KEEP UPCOMING APPOINTMENT FOR FUTURE REFILLS)   hydrOXYzine (ATARAX/VISTARIL) 25 MG tablet, Take 0.5 tablets (12.5 mg total) by mouth every 8 (eight) hours as needed for itching. (Patient not taking: Reported on 07/19/2023)  Medical History:  Past Medical History:  Diagnosis Date   Heart failure (HCC)    Seizures (HCC)    none in years, recently had medication change   Allergies:  Allergies  Allergen Reactions   Azithromycin Nausea And Vomiting   Erythromycin     Sensitivity (GI)   Percocet [Oxycodone-Acetaminophen]    Penicillins Itching and Rash     Surgical History:  She  has a past surgical history that includes RIGHT/LEFT HEART CATH AND CORONARY ANGIOGRAPHY (N/A, 11/05/2021) and Colonoscopy. Family History:  Her family history includes Breast cancer in her maternal aunt and mother; Cancer in her mother; Diabetes in her mother and sister; Heart Problems in her father; Kidney failure in her sister; Lung cancer in her half-sister; Stroke in her mother. She was adopted.  REVIEW OF SYSTEMS  : All other systems reviewed and negative except where noted in the History of Present Illness.  PHYSICAL EXAM: BP 100/64   Pulse 84    Ht 5\' 1"  (1.549 m)   Wt 74 lb 4 oz (33.7 kg)   BMI 14.03 kg/m  General Appearance: chronically ill appearing, older than stated age, in no apparent distress. Head:   Normocephalic and atraumatic. Respiratory: Respiratory effort normal Cardio: RRR  Abdomen: Soft,  Flat , Tender to palpation in the epigastric and periumbilical area. Normal bowel sounds. Neuro: Alert and  oriented x4;  No focal deficits. Psych:  Cooperative. Normal mood and affect.    Imogene Burn, MD 12:35 PM  I spent 40 minutes of time, including in depth chart review, independent review of results as outlined above, communicating results with the patient directly, face-to-face time with the patient, coordinating care, ordering studies and medications as appropriate, and documentation.

## 2023-07-20 ENCOUNTER — Telehealth: Payer: Self-pay | Admitting: *Deleted

## 2023-07-20 ENCOUNTER — Telehealth: Payer: Self-pay | Admitting: Internal Medicine

## 2023-07-20 ENCOUNTER — Telehealth: Payer: Self-pay

## 2023-07-20 NOTE — Telephone Encounter (Signed)
Spoke to the patient about the results of her CT scan, which showed advanced abdominal atherosclerosis with high-grade stenosis versus subtotal occlusion at the origin of the SMA.  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  I discussed this case with Dr. Lenell Antu with vascular surgery on-call today.  Dr. Lenell Antu agrees that this atherosclerosis does appear to be clinically significant and recommended that she either go to the ED or be seen by vascular surgery maybe in 1 week if they could get her an appt.  Patient's abdominal pain is fairly significant and she has worsening ab pain when eating.  Her weight is very low at this time.  Patient's abdominal pain has also been worsening over time.  Thus I I think it is prudent for her to go to the ED for more urgent evaluation.  Patient will likely need a CTA A/P w/contrast. Patient would also likely benefit from nutritional supplementation since she has not been able to consume much food as result of her abdominal pain.  Patient may be a candidate for an endovascular procedure with vascular surgery in the future.  Patient has several appointments tomorrow because she found a breast lump and thus she would like to complete her mammography and ultrasound tomorrow before going to the ED. I told the patient to specifically go to the [facility] ED so that vascular surgery could see her as an inpatient.  Patient is agreeable to this.  I also told the patient that she has wall thickening of the distal esophagus with small hiatal hernia.  We originally planned for an EGD on 12/27, but our anesthesia staff are requiring cardiac clearance prior to performing her procedure in the [facility] since patient has described issues with chest pain on a recent ED visit and is scheduled to see cardiology in January.  I recommend that the patient start omeprazole 20 mg daily in the event that this thickening in the distal esophagus is due to  esophagitis.  Will plan for EGD after cardiology clearance is granted.  Patient was previously aware that she has fibroids.  Will CC Dr. Lenell Antu to this telephone note.  Beth, let's cancel her upcoming EGD on 12/27.  Let's contact her cardiologist and see when cardiology clearance could be granted for an EGD.  I do think that mesenteric ischemia is the more likely cause of her abdominal pain at this time.

## 2023-07-20 NOTE — Telephone Encounter (Signed)
   Name: Christie Moreno  DOB: 09/26/1955  MRN: [id number]  Primary Cardiologist: Nanetta Batty, MD  Chart reviewed as part of pre-operative protocol coverage. The patient has an upcoming visit scheduled with Dr. Allyson Sabal on 08/31/2023 at which time clearance can be addressed in case there are any issues that would impact surgical recommendations.  Colonoscopy is not scheduled until TBD as below. I added preop FYI to appointment note so that provider is aware to address at time of outpatient visit.  Per office protocol the cardiology provider should forward their finalized clearance decision and recommendations regarding antiplatelet therapy to the requesting party below.     I will route this message as FYI to requesting party and remove this message from the preop box as separate preop APP input not needed at this time.   Please call with any questions.  Joylene Grapes, NP  07/20/2023, 2:39 PM

## 2023-07-20 NOTE — Telephone Encounter (Signed)
Dr. Leonides Schanz,  This pt is scheduled with you on Dec 27.  She recently presented to the ER with c/o chest pain.  Upon discharged she was tasked to f/u with [facility] which has not been completed.  Will need this f/u to be completed or have [facility] provide clearance in order to clear her for care at [facility].  Thanks,  Cathlyn Parsons

## 2023-07-20 NOTE — Telephone Encounter (Signed)
 [facility] Pre-operative Risk Assessment     Request for surgical clearance:     Endoscopy Procedure  What type of surgery is being performed?     EGD  When is this surgery scheduled?     To be determined once cleared by Cardiology  What type of clearance is required ?  Medical  Are there any medications that need to be held prior to surgery and how long? Not on anticoagulation   Practice name and name of physician performing surgery?      [facility] Dr Imogene Burn What is your office phone and fax number?      Phone- [phone]  Fax- [phone]  Anesthesia type (None, local, MAC, general) ?       MAC   Please route your response to Heber Hague, LPN

## 2023-07-21 ENCOUNTER — Other Ambulatory Visit: Payer: Self-pay

## 2023-07-21 ENCOUNTER — Other Ambulatory Visit: Payer: Medicare Other

## 2023-07-21 ENCOUNTER — Encounter (HOSPITAL_COMMUNITY): Payer: Self-pay

## 2023-07-21 ENCOUNTER — Inpatient Hospital Stay (HOSPITAL_COMMUNITY)
Admission: EM | Admit: 2023-07-21 | Discharge: 2023-07-26 | DRG: 356 | Disposition: A | Discharge status: Home / Self Care | Payer: Medicare Other | Attending: Internal Medicine | Admitting: Internal Medicine

## 2023-07-21 DIAGNOSIS — K559 Vascular disorder of intestine, unspecified: Secondary | ICD-10-CM | POA: Diagnosis not present

## 2023-07-21 DIAGNOSIS — Z833 Family history of diabetes mellitus: Secondary | ICD-10-CM

## 2023-07-21 DIAGNOSIS — L89151 Pressure ulcer of sacral region, stage 1: Secondary | ICD-10-CM | POA: Diagnosis present

## 2023-07-21 DIAGNOSIS — E871 Hypo-osmolality and hyponatremia: Secondary | ICD-10-CM

## 2023-07-21 DIAGNOSIS — Z7902 Long term (current) use of antithrombotics/antiplatelets: Secondary | ICD-10-CM

## 2023-07-21 DIAGNOSIS — J4489 Other specified chronic obstructive pulmonary disease: Secondary | ICD-10-CM | POA: Diagnosis present

## 2023-07-21 DIAGNOSIS — I5032 Chronic diastolic (congestive) heart failure: Secondary | ICD-10-CM | POA: Diagnosis present

## 2023-07-21 DIAGNOSIS — D72829 Elevated white blood cell count, unspecified: Secondary | ICD-10-CM | POA: Diagnosis present

## 2023-07-21 DIAGNOSIS — E785 Hyperlipidemia, unspecified: Secondary | ICD-10-CM | POA: Diagnosis present

## 2023-07-21 DIAGNOSIS — R64 Cachexia: Secondary | ICD-10-CM | POA: Diagnosis present

## 2023-07-21 DIAGNOSIS — E039 Hypothyroidism, unspecified: Secondary | ICD-10-CM | POA: Diagnosis present

## 2023-07-21 DIAGNOSIS — E782 Mixed hyperlipidemia: Secondary | ICD-10-CM | POA: Diagnosis not present

## 2023-07-21 DIAGNOSIS — I110 Hypertensive heart disease with heart failure: Secondary | ICD-10-CM | POA: Diagnosis present

## 2023-07-21 DIAGNOSIS — F17210 Nicotine dependence, cigarettes, uncomplicated: Secondary | ICD-10-CM | POA: Diagnosis present

## 2023-07-21 DIAGNOSIS — Z681 Body mass index (BMI) 19 or less, adult: Secondary | ICD-10-CM | POA: Diagnosis not present

## 2023-07-21 DIAGNOSIS — Z881 Allergy status to other antibiotic agents status: Secondary | ICD-10-CM

## 2023-07-21 DIAGNOSIS — L8990 Pressure ulcer of unspecified site, unspecified stage: Secondary | ICD-10-CM

## 2023-07-21 DIAGNOSIS — F419 Anxiety disorder, unspecified: Secondary | ICD-10-CM | POA: Diagnosis present

## 2023-07-21 DIAGNOSIS — E876 Hypokalemia: Secondary | ICD-10-CM | POA: Diagnosis present

## 2023-07-21 DIAGNOSIS — G40209 Localization-related (focal) (partial) symptomatic epilepsy and epileptic syndromes with complex partial seizures, not intractable, without status epilepticus: Secondary | ICD-10-CM | POA: Diagnosis present

## 2023-07-21 DIAGNOSIS — Z841 Family history of disorders of kidney and ureter: Secondary | ICD-10-CM

## 2023-07-21 DIAGNOSIS — R627 Adult failure to thrive: Secondary | ICD-10-CM

## 2023-07-21 DIAGNOSIS — Z823 Family history of stroke: Secondary | ICD-10-CM

## 2023-07-21 DIAGNOSIS — Z79899 Other long term (current) drug therapy: Secondary | ICD-10-CM | POA: Diagnosis not present

## 2023-07-21 DIAGNOSIS — Z801 Family history of malignant neoplasm of trachea, bronchus and lung: Secondary | ICD-10-CM

## 2023-07-21 DIAGNOSIS — Z880 Allergy status to penicillin: Secondary | ICD-10-CM

## 2023-07-21 DIAGNOSIS — I2510 Atherosclerotic heart disease of native coronary artery without angina pectoris: Secondary | ICD-10-CM | POA: Diagnosis present

## 2023-07-21 DIAGNOSIS — K551 Chronic vascular disorders of intestine: Principal | ICD-10-CM | POA: Diagnosis present

## 2023-07-21 DIAGNOSIS — E43 Unspecified severe protein-calorie malnutrition: Secondary | ICD-10-CM | POA: Diagnosis present

## 2023-07-21 DIAGNOSIS — Z803 Family history of malignant neoplasm of breast: Secondary | ICD-10-CM

## 2023-07-21 DIAGNOSIS — Z79890 Hormone replacement therapy (postmenopausal): Secondary | ICD-10-CM

## 2023-07-21 DIAGNOSIS — Z7982 Long term (current) use of aspirin: Secondary | ICD-10-CM

## 2023-07-21 DIAGNOSIS — Z5986 Financial insecurity: Secondary | ICD-10-CM

## 2023-07-21 DIAGNOSIS — Z885 Allergy status to narcotic agent status: Secondary | ICD-10-CM

## 2023-07-21 DIAGNOSIS — Z7984 Long term (current) use of oral hypoglycemic drugs: Secondary | ICD-10-CM

## 2023-07-21 DIAGNOSIS — Z79818 Long term (current) use of other agents affecting estrogen receptors and estrogen levels: Secondary | ICD-10-CM

## 2023-07-21 DIAGNOSIS — Z8249 Family history of ischemic heart disease and other diseases of the circulatory system: Secondary | ICD-10-CM

## 2023-07-21 DIAGNOSIS — F17200 Nicotine dependence, unspecified, uncomplicated: Secondary | ICD-10-CM | POA: Diagnosis present

## 2023-07-21 DIAGNOSIS — K219 Gastro-esophageal reflux disease without esophagitis: Secondary | ICD-10-CM | POA: Diagnosis present

## 2023-07-21 LAB — CBC WITH DIFFERENTIAL/PLATELET
Abs Immature Granulocytes: 0.05 10*3/uL (ref 0.00–0.07)
Basophils Absolute: 0 10*3/uL (ref 0.0–0.1)
Basophils Relative: 0 %
Eosinophils Absolute: 0 10*3/uL (ref 0.0–0.5)
Eosinophils Relative: 0 %
HCT: 42.7 % (ref 36.0–46.0)
Hemoglobin: 14.3 g/dL (ref 12.0–15.0)
Immature Granulocytes: 0 %
Lymphocytes Relative: 10 %
Lymphs Abs: 1.2 10*3/uL (ref 0.7–4.0)
MCH: 30 pg (ref 26.0–34.0)
MCHC: 33.5 g/dL (ref 30.0–36.0)
MCV: 89.7 fL (ref 80.0–100.0)
Monocytes Absolute: 0.7 10*3/uL (ref 0.1–1.0)
Monocytes Relative: 5 %
Neutro Abs: 10.5 10*3/uL — ABNORMAL HIGH (ref 1.7–7.7)
Neutrophils Relative %: 85 %
Platelets: 177 10*3/uL (ref 150–400)
RBC: 4.76 MIL/uL (ref 3.87–5.11)
RDW: 14.1 % (ref 11.5–15.5)
WBC: 12.5 10*3/uL — ABNORMAL HIGH (ref 4.0–10.5)
nRBC: 0 % (ref 0.0–0.2)

## 2023-07-21 LAB — COMPREHENSIVE METABOLIC PANEL
ALT: 25 U/L (ref 0–44)
AST: 33 U/L (ref 15–41)
Albumin: 3.2 g/dL — ABNORMAL LOW (ref 3.5–5.0)
Alkaline Phosphatase: 56 U/L (ref 38–126)
Anion gap: 13 (ref 5–15)
BUN: 16 mg/dL (ref 8–23)
CO2: 26 mmol/L (ref 22–32)
Calcium: 9.4 mg/dL (ref 8.9–10.3)
Chloride: 92 mmol/L — ABNORMAL LOW (ref 98–111)
Creatinine, Ser: 0.91 mg/dL (ref 0.44–1.00)
GFR, Estimated: >60 mL/min (ref >60)
Glucose, Bld: 86 mg/dL (ref 70–99)
Potassium: 3.8 mmol/L (ref 3.5–5.1)
Sodium: 131 mmol/L — ABNORMAL LOW (ref 135–145)
Total Bilirubin: 0.8 mg/dL (ref <1.2)
Total Protein: 6 g/dL — ABNORMAL LOW (ref 6.5–8.1)

## 2023-07-21 LAB — HIV ANTIBODY (ROUTINE TESTING W REFLEX): HIV Screen 4th Generation wRfx: NONREACTIVE

## 2023-07-21 LAB — I-STAT CG4 LACTIC ACID, ED: Lactic Acid, Venous: 0.9 mmol/L (ref 0.5–1.9)

## 2023-07-21 LAB — LIPASE, BLOOD: Lipase: 26 U/L (ref 11–51)

## 2023-07-21 MED ORDER — ENSURE ENLIVE PO LIQD
237.0000 mL | Freq: Two times a day (BID) | ORAL | Status: DC
  Administered 2023-07-21 – 2023-07-25 (×5): 237 mL via ORAL

## 2023-07-21 MED ORDER — ALBUTEROL SULFATE (2.5 MG/3ML) 0.083% IN NEBU: 2.5000 mg | INHALATION_SOLUTION | Freq: Four times a day (QID) | RESPIRATORY_TRACT | Status: DC | PRN

## 2023-07-21 MED ORDER — PNEUMOCOCCAL 20-VAL CONJ VACC 0.5 ML IM SUSY
0.5000 mL | PREFILLED_SYRINGE | INTRAMUSCULAR | Status: DC
  Filled 2023-07-21: qty 0.5

## 2023-07-21 MED ORDER — ASPIRIN 81 MG PO TBEC
81.0000 mg | DELAYED_RELEASE_TABLET | Freq: Every day | ORAL | Status: DC
  Administered 2023-07-22 – 2023-07-26 (×4): 81 mg via ORAL
  Filled 2023-07-21 (×4): qty 1

## 2023-07-21 MED ORDER — HYDROMORPHONE HCL 1 MG/ML IJ SOLN: 0.5000 mg | INTRAMUSCULAR | Status: DC | PRN

## 2023-07-21 MED ORDER — ATORVASTATIN CALCIUM 80 MG PO TABS
80.0000 mg | ORAL_TABLET | Freq: Every evening | ORAL | Status: DC
  Administered 2023-07-21 – 2023-07-25 (×5): 80 mg via ORAL
  Filled 2023-07-21 (×5): qty 1

## 2023-07-21 MED ORDER — POLYETHYLENE GLYCOL 3350 17 G PO PACK: 17.0000 g | PACK | Freq: Every day | ORAL | Status: DC | PRN

## 2023-07-21 MED ORDER — LAMOTRIGINE 100 MG PO TABS
100.0000 mg | ORAL_TABLET | Freq: Two times a day (BID) | ORAL | Status: DC
  Administered 2023-07-21 – 2023-07-26 (×9): 100 mg via ORAL
  Filled 2023-07-21 (×4): qty 1
  Filled 2023-07-21: qty 4
  Filled 2023-07-21 (×5): qty 1

## 2023-07-21 MED ORDER — CARVEDILOL 3.125 MG PO TABS
3.1250 mg | ORAL_TABLET | Freq: Two times a day (BID) | ORAL | Status: DC
  Administered 2023-07-22 – 2023-07-26 (×7): 3.125 mg via ORAL
  Filled 2023-07-21 (×8): qty 1

## 2023-07-21 MED ORDER — LEVOTHYROXINE SODIUM 25 MCG PO TABS
25.0000 ug | ORAL_TABLET | Freq: Every day | ORAL | Status: DC
  Administered 2023-07-22 – 2023-07-26 (×5): 25 ug via ORAL
  Filled 2023-07-21 (×5): qty 1

## 2023-07-21 MED ORDER — ACETAMINOPHEN 325 MG PO TABS
650.0000 mg | ORAL_TABLET | Freq: Four times a day (QID) | ORAL | Status: DC | PRN
  Administered 2023-07-21 – 2023-07-26 (×6): 650 mg via ORAL
  Filled 2023-07-21 (×7): qty 2

## 2023-07-21 MED ORDER — SODIUM CHLORIDE 0.9% FLUSH
3.0000 mL | Freq: Two times a day (BID) | INTRAVENOUS | Status: DC
  Administered 2023-07-21 – 2023-07-26 (×10): 3 mL via INTRAVENOUS

## 2023-07-21 MED ORDER — FUROSEMIDE 20 MG PO TABS
20.0000 mg | ORAL_TABLET | Freq: Every day | ORAL | Status: DC
  Administered 2023-07-23 – 2023-07-24 (×2): 20 mg via ORAL
  Filled 2023-07-21 (×2): qty 1

## 2023-07-21 MED ORDER — LOSARTAN POTASSIUM 50 MG PO TABS
25.0000 mg | ORAL_TABLET | Freq: Every day | ORAL | Status: DC
  Administered 2023-07-23 – 2023-07-24 (×2): 25 mg via ORAL
  Filled 2023-07-21 (×2): qty 1

## 2023-07-21 MED ORDER — ACETAMINOPHEN 650 MG RE SUPP: 650.0000 mg | Freq: Four times a day (QID) | RECTAL | Status: DC | PRN

## 2023-07-21 MED ORDER — HEPARIN SODIUM (PORCINE) 5000 UNIT/ML IJ SOLN: 5000.0000 [IU] | Freq: Three times a day (TID) | INTRAMUSCULAR | Status: DC

## 2023-07-21 NOTE — ED Notes (Signed)
ED TO INPATIENT HANDOFF REPORT  ED Nurse Name and Phone #: Osvaldo Shipper RN [phone]   S Name/Age/Gender Christie Moreno 67 y.o. female Room/Bed: 044C/044C  Code Status   Code Status: Full Code  Home/SNF/Other Home Patient oriented to: self, place, time, and situation Is this baseline? Yes   Triage Complete: Triage complete  Chief Complaint Mesenteric ischemia [facility]) [K55.9]  Triage Note PT arrived POV from home stating she was told to come see Dr. Juanetta Gosling after having a CT Scan done on 07/19/23. Pt has been dealing with ongoing abdominal pain for several months and was told to come here to discuss the game plan moving forward.    Allergies Allergies  Allergen Reactions   Azithromycin Nausea And Vomiting   Erythromycin     Sensitivity (GI)   Penicillins Itching and Rash   Percocet [Oxycodone-Acetaminophen] Rash    Level of Care/Admitting Diagnosis ED Disposition     ED Disposition  Admit   Condition  --   Comment  Hospital Area: [facility] [[id number]]  Level of Care: Telemetry Surgical [105]  May admit patient to [facility] or [facility] if equivalent level of care is available:: No  Covid Evaluation: Asymptomatic - no recent exposure (last 10 days) testing not required  Diagnosis: Mesenteric ischemia [facility]) [[id number]]  Admitting Physician: Synetta Fail [[id number]]  Attending Physician: Synetta Fail [[id number]]  Certification:: I certify this patient will need inpatient services for at least 2 midnights  Expected Medical Readiness: 07/27/2023          B Medical/Surgery History Past Medical History:  Diagnosis Date   Complex partial seizure (HCC) 04/22/2015   Convulsions (HCC) 04/19/2013   Heart failure (HCC)    Seizures (HCC)    none in years, recently had medication change   Past Surgical History:  Procedure Laterality Date   COLONOSCOPY     RIGHT/LEFT HEART CATH AND CORONARY ANGIOGRAPHY N/A 11/05/2021   Procedure:  RIGHT/LEFT HEART CATH AND CORONARY ANGIOGRAPHY;  Surgeon: Lyn Records, MD;  Location: [facility] INVASIVE CV LAB;  Service: Cardiovascular;  Laterality: N/A;     A IV Location/Drains/Wounds Patient Lines/Drains/Airways Status     Active Line/Drains/Airways     Name Placement date Placement time Site Days   Peripheral IV 07/19/23 24 G Distal;Left;Posterior Forearm 07/19/23  1755  Forearm  2            Intake/Output Last 24 hours No intake or output data in the 24 hours ending 07/21/23 1618  Labs/Imaging Results for orders placed or performed during the hospital encounter of 07/21/23 (from the past 48 hours)  CBC with Differential     Status: Abnormal   Collection Time: 07/21/23 12:27 PM  Result Value Ref Range   WBC 12.5 (H) 4.0 - 10.5 K/uL   RBC 4.76 3.87 - 5.11 MIL/uL   Hemoglobin 14.3 12.0 - 15.0 g/dL   HCT 86.5 78.4 - 69.6 %   MCV 89.7 80.0 - 100.0 fL   MCH 30.0 26.0 - 34.0 pg   MCHC 33.5 30.0 - 36.0 g/dL   RDW 29.5 28.4 - 13.2 %   Platelets 177 150 - 400 K/uL   nRBC 0.0 0.0 - 0.2 %   Neutrophils Relative % 85 %   Neutro Abs 10.5 (H) 1.7 - 7.7 K/uL   Lymphocytes Relative 10 %   Lymphs Abs 1.2 0.7 - 4.0 K/uL   Monocytes Relative 5 %   Monocytes Absolute 0.7 0.1 - 1.0 K/uL  Eosinophils Relative 0 %   Eosinophils Absolute 0.0 0.0 - 0.5 K/uL   Basophils Relative 0 %   Basophils Absolute 0.0 0.0 - 0.1 K/uL   Immature Granulocytes 0 %   Abs Immature Granulocytes 0.05 0.00 - 0.07 K/uL    Comment: Performed at [facility], [address] [place], [place] [address]  Comprehensive metabolic panel     Status: Abnormal   Collection Time: 07/21/23 12:27 PM  Result Value Ref Range   Sodium 131 (L) 135 - 145 mmol/L   Potassium 3.8 3.5 - 5.1 mmol/L   Chloride 92 (L) 98 - 111 mmol/L   CO2 26 22 - 32 mmol/L   Glucose, Bld 86 70 - 99 mg/dL    Comment: Glucose reference range applies only to samples taken after fasting for at least 8 hours.   BUN 16 8 - 23 mg/dL    Creatinine, Ser 6.96 0.44 - 1.00 mg/dL   Calcium 9.4 8.9 - 29.5 mg/dL   Total Protein 6.0 (L) 6.5 - 8.1 g/dL   Albumin 3.2 (L) 3.5 - 5.0 g/dL   AST 33 15 - 41 U/L   ALT 25 0 - 44 U/L   Alkaline Phosphatase 56 38 - 126 U/L   Total Bilirubin 0.8 <1.2 mg/dL   GFR, Estimated >28 >41 mL/min    Comment: (NOTE) Calculated using the CKD-EPI Creatinine Equation (2021)    Anion gap 13 5 - 15    Comment: Performed at [facility], [address], [place], [place] [address]  Lipase, blood     Status: None   Collection Time: 07/21/23 12:27 PM  Result Value Ref Range   Lipase 26 11 - 51 U/L    Comment: Performed at [facility], [address], [place], [place] [address]  I-Stat CG4 Lactic Acid     Status: None   Collection Time: 07/21/23 12:56 PM  Result Value Ref Range   Lactic Acid, Venous 0.9 0.5 - 1.9 mmol/L   CT ABDOMEN PELVIS W CONTRAST Result Date: 07/19/2023 CLINICAL DATA:  Abdominal pain. Epigastric pain. Alternating constipation and diarrhea. Nausea without vomiting. Periumbilical abdominal pain. EXAM: CT ABDOMEN AND PELVIS WITH CONTRAST TECHNIQUE: Multidetector CT imaging of the abdomen and pelvis was performed using the standard protocol following bolus administration of intravenous contrast. RADIATION DOSE REDUCTION: This exam was performed according to the departmental dose-optimization program which includes automated exposure control, adjustment of the mA and/or kV according to patient size and/or use of iterative reconstruction technique. CONTRAST:  75mL OMNIPAQUE IOHEXOL 300 MG/ML  SOLN COMPARISON:  Noncontrast CT 11/03/2021 FINDINGS: Lower chest: Emphysema.  No basilar airspace disease. Hepatobiliary: Tiny hypodensity in the left lobe of the liver is too small to characterize. No specific imaging follow-up is needed. No gallstones, gallbladder wall thickening, or biliary dilatation. Pancreas: Unremarkable. No pancreatic ductal dilatation or surrounding inflammatory  changes. Spleen: Normal in size without focal abnormality. Adrenals/Urinary Tract: Normal adrenal glands. Right kidney is slightly ptotic. No hydronephrosis. No evidence of suspicious renal lesion. Nondistended urinary bladder, not well assessed. Stomach/Bowel: Contrast opacifies distal small bowel and proximal colon. Assessment of the remaining bowel loops is limited due to paucity of intra-abdominal fat. Wall thickening of the distal esophagus with small hiatal hernia. Few prominent loops of small bowel in the pelvis have no associated wall thickening. There is no obstruction with enteric contrast reaching distal to this. The appendix is not definitively seen. Small to moderate volume of formed stool  in the colon. Vascular/Lymphatic: Advanced aortic atherosclerosis. There is eccentric as well as circumferential mural thrombus throughout the abdominal aorta. Calcified nonocclusive plaque at the origin of the celiac artery. High-grade stenosis versus subtotal occlusion of the origin of the SMA which may be segmentally occluded, series 2, image 17. There is distal reconstitution, however the vessels are heavily calcified. 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 The left external iliac artery is completely occluded. There is reconstitution at the common femoral artery. No bulky abdominopelvic adenopathy. Reproductive: Heterogeneous uterus suggests fibroids. Suspected exophytic fibroid posteriorly measuring 3.3 cm that is enhancing adnexa are poorly defined. Other: No free air or ascites. Musculoskeletal: Multilevel degenerative change in the lumbar spine. No focal bone lesion. Generalized paucity of body fat can be seen with cachexia. IMPRESSION: 1. Few prominent loops of small bowel in the pelvis have no associated wall thickening. There is no obstruction with enteric contrast reaching distal to this. Findings may represent focal ileus. 2. Advanced abdominal atherosclerosis. High-grade stenosis versus  subtotal occlusion at the origin of the SMA which is likely segmentally occluded proximally. There is some distal reconstitution, however the vessels are heavily calcified. 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 Eccentric as well as circumferential mural thrombus in the abdominal aorta. 3. Wall thickening of the distal esophagus with small hiatal hernia. 4. Heterogeneous uterus suggests fibroids. Suspected exophytic fibroid posteriorly measuring 3.3 cm that is enhancing. Recommend nonemergent pelvic ultrasound for further evaluation. Aortic Atherosclerosis (ICD10-I70.0) and Emphysema (ICD10-J43.9). Electronically Signed   By: Narda Rutherford M.D.   On: 07/19/2023 19:54    Pending Labs Unresulted Labs (From admission, onward)     Start     Ordered   07/22/23 0500  Comprehensive metabolic panel  Tomorrow morning,   R        07/21/23 1539   07/22/23 0500  CBC  Tomorrow morning,   R        07/21/23 1539   07/22/23 0500  Lactic acid, plasma  (Lactic Acid)  Tomorrow morning,   R        07/21/23 1539   07/21/23 1524  HIV Antibody (routine testing w rflx)  (HIV Antibody (Routine testing w reflex) panel)  Once,   R        07/21/23 1539            Vitals/Pain Today's Vitals   07/21/23 1203 07/21/23 1216 07/21/23 1217 07/21/23 1546  BP: 114/61   (!) 133/48  Pulse: 77   62  Resp: 16   16  Temp: 97.6 F (36.4 C)   98.3 F (36.8 C)  TempSrc:    Oral  SpO2: 100%   100%  Weight:   33.1 kg   Height:   5\' 1"  (1.549 m)   PainSc:  9       Isolation Precautions No active isolations  Medications Medications  aspirin EC tablet 81 mg (has no administration in time range)  atorvastatin (LIPITOR) tablet 80 mg (has no administration in time range)  carvedilol (COREG) tablet 3.125 mg (has no administration in time range)  losartan (COZAAR) tablet 25 mg (has no administration in time range)  furosemide (LASIX)  tablet 20 mg (has no administration in time range)  levothyroxine (SYNTHROID) tablet 25 mcg (has no administration in time range)  lamoTRIgine (LAMICTAL) tablet 100 mg (has no administration in time range)  feeding supplement (ENSURE ENLIVE / ENSURE PLUS) liquid  237 mL (has no administration in time range)  albuterol (PROVENTIL) (2.5 MG/3ML) 0.083% nebulizer solution 2.5 mg (has no administration in time range)  heparin injection 5,000 Units (has no administration in time range)  sodium chloride flush (NS) 0.9 % injection 3 mL (has no administration in time range)  acetaminophen (TYLENOL) tablet 650 mg (has no administration in time range)    Or  acetaminophen (TYLENOL) suppository 650 mg (has no administration in time range)  polyethylene glycol (MIRALAX / GLYCOLAX) packet 17 g (has no administration in time range)  pneumococcal 20-valent conjugate vaccine (PREVNAR 20) injection 0.5 mL (has no administration in time range)    Mobility walks with person assist     Focused Assessments GI/vascular   R Recommendations: See Admitting Provider Note  Report given to:   Additional Notes:

## 2023-07-21 NOTE — ED Notes (Signed)
Pt well appearing upon transport to floor.

## 2023-07-21 NOTE — H&P (Signed)
History and Physical   Christie Moreno NUU:[id number] DOB: 1955/12/01 DOA: 07/21/2023  PCP: Melida Quitter, PA   Patient coming from: Home  Chief Complaint: Abdominal pain, abnormal CT  HPI: Christie Moreno is a 67 y.o. female with medical history significant of hyperlipidemia, hypothyroidism, CAD, diastolic CHF, malnutrition, seizure disorder, anxiety, COPD presenting with abdominal pain and abnormal CT.  Patient reports about a month of abdominal pain.  She was seen by GI 2 days ago due to this worsening epigastric/periumbilical abdominal pain.  Had some prior issues with constipation in the past but had not had a CT scan for some time.  Had some alternating diarrhea and constipation.  GI sent patient for a CT abdomen pelvis for further evaluation.  This came back yesterday showing loops of bowel  without wall thickening representing possible ileus but no obstruction.  Also noted severe atherosclerosis of the abdomen with severe stenosis of the SMA versus possible occlusion.  Additionally noted some thyroids and recommended an ultrasound for suspected exophytic posterior fibroid.  Given these results, her GI provider, Dr. Leonides Schanz, discussed findings with vascular surgery who recommended close vascular surgery follow-up versus ED evaluation.  Due to progressive abdominal pain it was decided the patient should present to the ED for further evaluation which she has done today.  Denies fevers, chills, chest pain, shortness of breath, nausea, vomiting.  ED Course: Vital signs in the ED stable.  Lab workup significant for CMP with sodium 131, chloride 92, protein 6.0, albumin 3.2.  CBC with leukocytosis to 12.5.  Lactic acid normal with repeat pending.  Lipase normal.  Recent CT findings as above.  Vascular surgery consulted in the ED and on-call vascular surgeon discussed case with vascular surgeon who reviewed initial imaging.  Plan is for angiogram and possible endovascular intervention on  12/23.  For now recommending aspirin, statin.  Review of Systems: As per HPI otherwise all other systems reviewed and are negative.  Past Medical History:  Diagnosis Date   Complex partial seizure (HCC) 04/22/2015   Convulsions (HCC) 04/19/2013   Heart failure (HCC)    Seizures (HCC)    none in years, recently had medication change    Past Surgical History:  Procedure Laterality Date   COLONOSCOPY     RIGHT/LEFT HEART CATH AND CORONARY ANGIOGRAPHY N/A 11/05/2021   Procedure: RIGHT/LEFT HEART CATH AND CORONARY ANGIOGRAPHY;  Surgeon: Lyn Records, MD;  Location: [facility];  Service: Cardiovascular;  Laterality: N/A;    Social History  reports that she has been smoking cigarettes. She started smoking about 53 years ago. She has a 52 pack-year smoking history. She has been exposed to tobacco smoke. She has never used smokeless tobacco. She reports that she does not currently use alcohol. She reports that she does not use drugs.  Allergies  Allergen Reactions   Azithromycin Nausea And Vomiting   Erythromycin     Sensitivity (GI)   Percocet [Oxycodone-Acetaminophen]    Penicillins Itching and Rash    Family History  Adopted: Yes  Problem Relation Age of Onset   Breast cancer Mother    Diabetes Mother    Cancer Mother        breast   Stroke Mother    Heart Problems Father    Diabetes Sister    Kidney failure Sister    Breast cancer Maternal Aunt    Lung cancer Half-Sister    Colon cancer Neg Hx    Esophageal cancer Neg Hx  Epilepsy Neg Hx   Reviewed on admission  Prior to Admission medications   Medication Sig Start Date End Date Taking? Authorizing Provider  albuterol (PROVENTIL) (2.5 MG/3ML) 0.083% nebulizer solution Take 3 mLs (2.5 mg total) by nebulization every 6 (six) hours as needed for wheezing or shortness of breath. Patient taking differently: Take 2.5 mg by nebulization as needed for wheezing or shortness of breath. 11/06/21   Arrien, York Ram, MD  aspirin 81 MG EC tablet Take 1 tablet (81 mg total) by mouth daily. Swallow whole. 11/06/21   Arrien, York Ram, MD  atorvastatin (LIPITOR) 80 MG tablet TAKE 1 TABLET DAILY AT 6 P.M. (KEEP UPCOMING APPOINTMENT FOR FUTURE REFILLS 05/11/23   Runell Gess, MD  carvedilol (COREG) 3.125 MG tablet TAKE 1 TABLET TWICE A DAY WITH MEALS 01/03/23   Joylene Grapes, NP  estrogens, conjugated, (PREMARIN) 1.25 MG tablet Take 1.25 mg by mouth daily.    [provider]  feeding supplement (ENSURE ENLIVE / ENSURE PLUS) LIQD Take 237 mLs by mouth 2 (two) times daily between meals. 11/06/21   Arrien, York Ram, MD  furosemide (LASIX) 40 MG tablet TAKE ONE-HALF (1/2) TABLET (20 MG TOTAL) DAILY (KEEP UPCOMING APPOINTMENT FOR FUTURE REFILLS) Patient taking differently: Take 20 mg by mouth 2 (two) times daily. 05/31/23   Runell Gess, MD  hydrOXYzine (ATARAX/VISTARIL) 25 MG tablet Take 0.5 tablets (12.5 mg total) by mouth every 8 (eight) hours as needed for itching. Patient not taking: Reported on 07/19/2023 02/11/21   Wallis Bamberg, PA-C  ipratropium-albuterol (DUONEB) 0.5-2.5 (3) MG/3ML SOLN Take 3 mLs by nebulization every 6 (six) hours as needed. Patient not taking: Reported on 07/19/2023 11/06/21   Arrien, York Ram, MD  JARDIANCE 10 MG TABS tablet TAKE 1 TABLET DAILY 01/07/23   Runell Gess, MD  lamoTRIgine (LAMICTAL) 100 MG tablet Take 1 tablet (100 mg total) by mouth 2 (two) times daily. 04/14/23   Glean Salvo, NP  levothyroxine (SYNTHROID) 25 MCG tablet Take 1 tablet (25 mcg total) by mouth daily before breakfast. 06/24/23   Melida Quitter, PA  losartan (COZAAR) 50 MG tablet TAKE ONE-HALF (1/2) TABLET DAILY 01/24/23   Runell Gess, MD  potassium chloride SA (KLOR-CON M) 20 MEQ tablet TAKE 1 TABLET DAILY (KEEP UPCOMING APPOINTMENT FOR FUTURE REFILLS) 02/28/23   Corrin Parker, PA-C  progesterone (PROMETRIUM) 200 MG capsule Take 200 mg by mouth at bedtime. 04/20/22    [provider]    Physical Exam: Vitals:   07/21/23 1203 07/21/23 1217  BP: 114/61   Pulse: 77   Resp: 16   Temp: 97.6 F (36.4 C)   SpO2: 100%   Weight:  33.1 kg  Height:  5\' 1"  (1.549 m)    Physical Exam Constitutional:      General: She is not in acute distress.    Appearance: Normal appearance.     Comments: Thin  HENT:     Head: Normocephalic and atraumatic.     Mouth/Throat:     Mouth: Mucous membranes are moist.     Pharynx: Oropharynx is clear.  Eyes:     Extraocular Movements: Extraocular movements intact.     Pupils: Pupils are equal, round, and reactive to light.  Cardiovascular:     Rate and Rhythm: Normal rate and regular rhythm.     Pulses: Normal pulses.     Heart sounds: Normal heart sounds.  Pulmonary:     Effort: Pulmonary effort is  normal. No respiratory distress.     Breath sounds: Normal breath sounds.  Abdominal:     General: Bowel sounds are normal. There is no distension.     Palpations: Abdomen is soft.     Tenderness: There is no abdominal tenderness.  Musculoskeletal:        General: No swelling or deformity.  Skin:    General: Skin is warm and dry.  Neurological:     General: No focal deficit present.     Mental Status: Mental status is at baseline.    Labs on Admission: I have personally reviewed following labs and imaging studies  CBC: Recent Labs  Lab 07/21/23 1227  WBC 12.5*  NEUTROABS 10.5*  HGB 14.3  HCT 42.7  MCV 89.7  PLT 177    Basic Metabolic Panel: Recent Labs  Lab 07/21/23 1227  NA 131*  K 3.8  CL 92*  CO2 26  GLUCOSE 86  BUN 16  CREATININE 0.91  CALCIUM 9.4    GFR: Estimated Creatinine Clearance: 30.5 mL/min (by C-G formula based on SCr of 0.91 mg/dL).  Liver Function Tests: Recent Labs  Lab 07/21/23 1227  AST 33  ALT 25  ALKPHOS 56  BILITOT 0.8  PROT 6.0*  ALBUMIN 3.2*    Urine analysis:    Component Value Date/Time   COLORURINE YELLOW 05/06/2023 1249   APPEARANCEUR  CLEAR 05/06/2023 1249   LABSPEC 1.010 05/06/2023 1249   PHURINE 6.0 05/06/2023 1249   GLUCOSEU >=1000 (A) 05/06/2023 1249   HGBUR SMALL (A) 05/06/2023 1249   BILIRUBINUR NEGATIVE 05/06/2023 1249   KETONESUR NEGATIVE 05/06/2023 1249   PROTEINUR 100 (A) 06/14/2012 2008   UROBILINOGEN 0.2 05/06/2023 1249   NITRITE NEGATIVE 05/06/2023 1249   LEUKOCYTESUR NEGATIVE 05/06/2023 1249    Radiological Exams on Admission: CT ABDOMEN PELVIS W CONTRAST Result Date: 07/19/2023 CLINICAL DATA:  Abdominal pain. Epigastric pain. Alternating constipation and diarrhea. Nausea without vomiting. Periumbilical abdominal pain. EXAM: CT ABDOMEN AND PELVIS WITH CONTRAST TECHNIQUE: Multidetector CT imaging of the abdomen and pelvis was performed using the standard protocol following bolus administration of intravenous contrast. RADIATION DOSE REDUCTION: This exam was performed according to the departmental dose-optimization program which includes automated exposure control, adjustment of the mA and/or kV according to patient size and/or use of iterative reconstruction technique. CONTRAST:  75mL OMNIPAQUE IOHEXOL 300 MG/ML  SOLN COMPARISON:  Noncontrast CT 11/03/2021 FINDINGS: Lower chest: Emphysema.  No basilar airspace disease. Hepatobiliary: Tiny hypodensity in the left lobe of the liver is too small to characterize. No specific imaging follow-up is needed. No gallstones, gallbladder wall thickening, or biliary dilatation. Pancreas: Unremarkable. No pancreatic ductal dilatation or surrounding inflammatory changes. Spleen: Normal in size without focal abnormality. Adrenals/Urinary Tract: Normal adrenal glands. Right kidney is slightly ptotic. No hydronephrosis. No evidence of suspicious renal lesion. Nondistended urinary bladder, not well assessed. Stomach/Bowel: Contrast opacifies distal small bowel and proximal colon. Assessment of the remaining bowel loops is limited due to paucity of intra-abdominal fat. Wall thickening of  the distal esophagus with small hiatal hernia. Few prominent loops of small bowel in the pelvis have no associated wall thickening. There is no obstruction with enteric contrast reaching distal to this. The appendix is not definitively seen. Small to moderate volume of formed stool in the colon. Vascular/Lymphatic: Advanced aortic atherosclerosis. There is eccentric as well as circumferential mural thrombus throughout the abdominal aorta. Calcified nonocclusive plaque at the origin of the celiac artery. High-grade stenosis versus subtotal occlusion of the origin  of the SMA which may be segmentally occluded, series 2, image 17. There is distal reconstitution, however the vessels are heavily calcified. 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 The left external iliac artery is completely occluded. There is reconstitution at the common femoral artery. No bulky abdominopelvic adenopathy. Reproductive: Heterogeneous uterus suggests fibroids. Suspected exophytic fibroid posteriorly measuring 3.3 cm that is enhancing adnexa are poorly defined. Other: No free air or ascites. Musculoskeletal: Multilevel degenerative change in the lumbar spine. No focal bone lesion. Generalized paucity of body fat can be seen with cachexia. IMPRESSION: 1. Few prominent loops of small bowel in the pelvis have no associated wall thickening. There is no obstruction with enteric contrast reaching distal to this. Findings may represent focal ileus. 2. Advanced abdominal atherosclerosis. High-grade stenosis versus subtotal occlusion at the origin of the SMA which is likely segmentally occluded proximally. There is some distal reconstitution, however the vessels are heavily calcified. 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 Eccentric as well as circumferential mural thrombus in the abdominal aorta. 3. Wall thickening of the distal esophagus with small hiatal  hernia. 4. Heterogeneous uterus suggests fibroids. Suspected exophytic fibroid posteriorly measuring 3.3 cm that is enhancing. Recommend nonemergent pelvic ultrasound for further evaluation. Aortic Atherosclerosis (ICD10-I70.0) and Emphysema (ICD10-J43.9). Electronically Signed   By: Narda Rutherford M.D.   On: 07/19/2023 19:54    EKG: Independently reviewed. Sinus rhythm at 74 bpm.  Nonspecific T wave flattening.  Minimal baseline wander.  Assessment/Plan Principal Problem:   Mesenteric ischemia (HCC) Active Problems:   Partial symptomatic epilepsy with complex partial seizures, not intractable, without status epilepticus (HCC)   COPD with chronic bronchitis (HCC)   Anxiety   Protein-calorie malnutrition, severe   Hyperlipidemia   Hypothyroidism   Coronary artery disease involving native coronary artery of native heart without angina pectoris   FTT (failure to thrive) in adult   Mesenteric ischemia > Presenting with progressive abdominal pain.  Recent outpatient CT showing evidence of severe SMA stenosis versus occlusion. > Has been progressively worsening for about a month.  Decrease p.o. intake secondary to pain with eating and subsequent weight loss also reported. > Lactic acid initially normal with repeat pending.  Will also repeat tomorrow. > Evaluated by vascular surgery who is recommending aspirin and statin and plan for angiogram and possible endovascular intervention on 12/23. - Monitor on surgical telemetry overnight - Appreciate vascular surgery recommendations and assistance - Continue aspirin and atorvastatin - Will be on heparin for DVT prophylaxis - Trend lactic acid - Supportive care  Adult failure to thrive Protein calorie malnutrition > Has history of protein calorie malnutrition.  Now with decreased p.o. intake secondary to pain and weight loss concern for failure to thrive. - Continue home Ensure - Consult to nutrition   Hyperlipidemia - Continue home  atorvastatin  Hypothyroidism - Continue home Synthroid  CAD > Last In 2023 with atherosclerosis and 30 to 40% stenosis in both vessels with 75% stenosis noted at first diagonal branch of the LAD and 75% stenosis of the mid to distal left circumflex.  Medically managed. - Continue aspirin, atorvastatin, Coreg, losartan  Chronic diastolic CHF > Last echo in 2024 with EF 60-65%, G1 DD, normal RV function. - Continue Lasix, losartan, carvedilol  Seizure disorder - Continue home Lamictal  COPD - Continue as needed albuterol  DVT prophylaxis: Heparin Code Status:   Full Family Communication:  Updated at bedside  Disposition  Plan:   Patient is from:  Home  Anticipated DC to:  Home  Anticipated DC date:  4 to 7 days  Anticipated DC barriers: None  Consults called:  Vascular surgery Admission status:  Inpatient, telemetry  Severity of Illness: The appropriate patient status for this patient is INPATIENT. Inpatient status is judged to be reasonable and necessary in order to provide the required intensity of service to ensure the patient's safety. The patient's presenting symptoms, physical exam findings, and initial radiographic and laboratory data in the context of their chronic comorbidities is felt to place them at high risk for further clinical deterioration. Furthermore, it is not anticipated that the patient will be medically stable for discharge from the hospital within 2 midnights of admission.   * I certify that at the point of admission it is my clinical judgment that the patient will require inpatient hospital care spanning beyond 2 midnights from the point of admission due to high intensity of service, high risk for further deterioration and high frequency of surveillance required.Synetta Fail MD [facility]  How to contact the [facility] Attending or Consulting provider 7A - 7P or covering provider during after hours 7P -7A, for this patient?   Check the care  team in [facility] and look for a) attending/consulting [facility] provider listed and b) the [facility] team listed Log into [email or web address] and use [facility] universal password to access. If you do not have the password, please contact the hospital operator. Locate the [facility] provider you are looking for under [facility] and page to a number that you can be directly reached. If you still have difficulty reaching the provider, please page the [facility] (Director on Call) for the Hospitalists listed on amion for assistance.  07/21/2023, 3:39 PM

## 2023-07-21 NOTE — ED Provider Notes (Signed)
[place] EMERGENCY DEPARTMENT AT [facility] Provider Note   CSN: [id number] Arrival date & time: 07/21/23  1122     History  Chief Complaint  Patient presents with   Abdominal Pain    Christie Moreno is a 67 y.o. female who  has a past medical history of Complex partial seizure (HCC) (04/22/2015), Convulsions (HCC) (04/19/2013), Heart failure (HCC), and Seizures (HCC).  Was sent in for evaluation of severe vascular disease disease of the abdomen she has had months of abdominal pain.  She has current abdominal pain.  She had outpatient study that showed significant disease.  This was read by Dr. Blase Mess last night and she was told to come to the emergency department to be seen and evaluated by vascular.  She denies any vomiting.  She has failure to thrive due to severe weight loss and inability to eat without severe pain.  Abdominal Pain      Home Medications Prior to Admission medications   Medication Sig Start Date End Date Taking? Authorizing Provider  albuterol (PROVENTIL) (2.5 MG/3ML) 0.083% nebulizer solution Take 3 mLs (2.5 mg total) by nebulization every 6 (six) hours as needed for wheezing or shortness of breath. Patient taking differently: Take 3 mLs (2.5 mg total) by nebulization every 6 (six) hours as needed for wheezing or shortness of breath. 11/06/21  Yes Arrien, York Ram, MD  aspirin 81 MG EC tablet Take 1 tablet (81 mg total) by mouth daily. Swallow whole. 11/06/21  Yes Arrien, York Ram, MD  atorvastatin (LIPITOR) 80 MG tablet TAKE 1 TABLET DAILY AT 6 P.M. (KEEP UPCOMING APPOINTMENT FOR FUTURE REFILLS 05/11/23  Yes Runell Gess, MD  carvedilol (COREG) 3.125 MG tablet TAKE 1 TABLET TWICE A DAY WITH MEALS 01/03/23  Yes Monge, Petra Kuba, NP  estrogens, conjugated, (PREMARIN) 1.25 MG tablet Take 1.25 mg by mouth daily.   Yes [provider]  feeding supplement (ENSURE ENLIVE / ENSURE PLUS) LIQD Take 237 mLs by mouth 2 (two) times daily  between meals. 11/06/21  Yes Arrien, York Ram, MD  furosemide (LASIX) 40 MG tablet TAKE ONE-HALF (1/2) TABLET (20 MG TOTAL) DAILY (KEEP UPCOMING APPOINTMENT FOR FUTURE REFILLS) Patient taking differently: Take 20 mg by mouth 2 (two) times daily. 05/31/23  Yes Runell Gess, MD  JARDIANCE 10 MG TABS tablet TAKE 1 TABLET DAILY 01/07/23  Yes Runell Gess, MD  lamoTRIgine (LAMICTAL) 100 MG tablet Take 1 tablet (100 mg total) by mouth 2 (two) times daily. 04/14/23  Yes Glean Salvo, NP  levothyroxine (SYNTHROID) 25 MCG tablet Take 1 tablet (25 mcg total) by mouth daily before breakfast. 06/24/23  Yes Saralyn Pilar A, PA  losartan (COZAAR) 50 MG tablet TAKE ONE-HALF (1/2) TABLET DAILY 01/24/23  Yes Runell Gess, MD  potassium chloride SA (KLOR-CON M) 20 MEQ tablet TAKE 1 TABLET DAILY (KEEP UPCOMING APPOINTMENT FOR FUTURE REFILLS) Patient taking differently: Take 20 mEq by mouth daily. 02/28/23  Yes Marjie Skiff E, PA-C  progesterone (PROMETRIUM) 200 MG capsule Take 200 mg by mouth at bedtime. 04/20/22  Yes [provider]      Allergies    Azithromycin, Erythromycin, Penicillins, and Percocet [oxycodone-acetaminophen]    Review of Systems   Review of Systems  Gastrointestinal:  Positive for abdominal pain.    Physical Exam Updated Vital Signs BP (!) 129/58 (BP Location: Left Arm)   Pulse 63   Temp 98.2 F (36.8 C) (Oral)   Resp 18   Ht  5\' 1"  (1.549 m)   Wt 34.6 kg   SpO2 96%   BMI 14.41 kg/m  Physical Exam Vitals and nursing note reviewed.  Constitutional:      General: She is not in acute distress.    Appearance: She is well-developed. She is not diaphoretic.  HENT:     Head: Normocephalic and atraumatic.     Right Ear: External ear normal.     Left Ear: External ear normal.     Nose: Nose normal.     Mouth/Throat:     Mouth: Mucous membranes are moist.  Eyes:     General: No scleral icterus.    Conjunctiva/sclera: Conjunctivae normal.   Cardiovascular:     Rate and Rhythm: Normal rate and regular rhythm.     Heart sounds: Normal heart sounds. No murmur heard.    No friction rub. No gallop.  Pulmonary:     Effort: Pulmonary effort is normal. No respiratory distress.     Breath sounds: Normal breath sounds.  Abdominal:     General: Bowel sounds are normal. There is no distension.     Palpations: Abdomen is soft. There is no mass.     Tenderness: There is generalized abdominal tenderness. There is no guarding.  Musculoskeletal:     Cervical back: Normal range of motion.  Skin:    General: Skin is warm and dry.  Neurological:     Mental Status: She is alert and oriented to person, place, and time.  Psychiatric:        Behavior: Behavior normal.     ED Results / Procedures / Treatments   Labs (all labs ordered are listed, but only abnormal results are displayed) Labs Reviewed  CBC WITH DIFFERENTIAL/PLATELET - Abnormal; Notable for the following components:      Result Value   WBC 12.5 (*)    Neutro Abs 10.5 (*)    All other components within normal limits  COMPREHENSIVE METABOLIC PANEL - Abnormal; Notable for the following components:   Sodium 131 (*)    Chloride 92 (*)    Total Protein 6.0 (*)    Albumin 3.2 (*)    All other components within normal limits  COMPREHENSIVE METABOLIC PANEL - Abnormal; Notable for the following components:   Sodium 130 (*)    Potassium 3.4 (*)    Chloride 97 (*)    Creatinine, Ser 1.04 (*)    Calcium 8.4 (*)    Total Protein 4.8 (*)    Albumin 2.5 (*)    GFR, Estimated 58 (*)    All other components within normal limits  CBC - Abnormal; Notable for the following components:   RBC 3.86 (*)    Hemoglobin 11.9 (*)    HCT 34.4 (*)    All other components within normal limits  COMPREHENSIVE METABOLIC PANEL - Abnormal; Notable for the following components:   Sodium 134 (*)    Creatinine, Ser 1.07 (*)    Calcium 8.6 (*)    Total Protein 5.1 (*)    Albumin 2.6 (*)    GFR,  Estimated 56 (*)    All other components within normal limits  LIPASE, BLOOD  HIV ANTIBODY (ROUTINE TESTING W REFLEX)  LACTIC ACID, PLASMA  I-STAT CG4 LACTIC ACID, ED  I-STAT CG4 LACTIC ACID, ED    EKG EKG Interpretation Date/Time:  Thursday July 21 2023 13:01:32 EST Ventricular Rate:  74 PR Interval:  166 QRS Duration:  82 QT Interval:  398 QTC Calculation: 441  R Axis:   95  Text Interpretation: Normal sinus rhythm Rightward axis Cannot rule out Anterior infarct , age undetermined Abnormal ECG When compared with ECG of 10-Jul-2023 03:52, PREVIOUS ECG IS PRESENT Confirmed by Eber Hong ([id number]) on 07/23/2023 8:51:04 AM  Radiology No results found.   Procedures Procedures    Medications Ordered in ED Medications  aspirin EC tablet 81 mg (81 mg Oral Given 07/24/23 0909)  atorvastatin (LIPITOR) tablet 80 mg (80 mg Oral Given 07/23/23 1811)  carvedilol (COREG) tablet 3.125 mg (3.125 mg Oral Given 07/24/23 0909)  losartan (COZAAR) tablet 25 mg (25 mg Oral Given 07/24/23 0909)  furosemide (LASIX) tablet 20 mg (20 mg Oral Given 07/24/23 0909)  levothyroxine (SYNTHROID) tablet 25 mcg (25 mcg Oral Given 07/24/23 0615)  lamoTRIgine (LAMICTAL) tablet 100 mg (100 mg Oral Given 07/24/23 0909)  feeding supplement (ENSURE ENLIVE / ENSURE PLUS) liquid 237 mL (237 mLs Oral Patient Refused/Not Given 07/24/23 1414)  albuterol (PROVENTIL) (2.5 MG/3ML) 0.083% nebulizer solution 2.5 mg (has no administration in time range)  sodium chloride flush (NS) 0.9 % injection 3 mL (3 mLs Intravenous Given 07/24/23 0910)  acetaminophen (TYLENOL) tablet 650 mg (650 mg Oral Given 07/23/23 1145)    Or  acetaminophen (TYLENOL) suppository 650 mg ( Rectal See Alternative 07/23/23 1145)  polyethylene glycol (MIRALAX / GLYCOLAX) packet 17 g (has no administration in time range)  pneumococcal 20-valent conjugate vaccine (PREVNAR 20) injection 0.5 mL (0.5 mLs Intramuscular Not Given 07/22/23 1246)   multivitamin with minerals tablet 1 tablet (1 tablet Oral Given 07/24/23 0909)  potassium chloride SA (KLOR-CON M) CR tablet 40 mEq (40 mEq Oral Given 07/23/23 1145)    ED Course/ Medical Decision Making/ A&P Clinical Course as of 07/24/23 1421  Thu Jul 21, 2023  1220 Consult placed to  VVS [AH]  1225 Case discussed with Dr. Hetty Blend who will consult on the patient in triage [AH]  1231 Dr Hetty Blend at bedside [AH]  1418 Comprehensive metabolic panel(!) [AH]  1418 CBC with Differential(!) [AH]  1534 Lipase, blood [AH]  1535 Comprehensive metabolic panel(!) [AH]  1535 CBC with Differential(!) Case discussed with Dr. Ginette Otto who accept the patient for admission [AH]    Clinical Course User Index [AH] Arthor Captain, PA-C                                 Medical Decision Making Patient here with severe abdominal pain, known vascular disease.  I read outpatient study.  She has been seen in triage by Dr. Hetty Blend who is recommending admission to the hospitalist service they will likely repeat angiogram workup and potential interventions this coming Monday.  Lab work is pending.  Amount and/or Complexity of Data Reviewed Labs: ordered. Decision-making details documented in ED Course.    Details: No acute findings ECG/medicine tests: ordered.    Details: EKG nsr rate of 74  Discussion of management or test interpretation with external provider(s): Case discussed with Dr. Alinda Money who will admit the patient.  Risk Decision regarding hospitalization.           Final Clinical Impression(s) / ED Diagnoses Final diagnoses:  Insufficient, vascular, mesenteric [facility])    Rx / DC Orders ED Discharge Orders     None         Arthor Captain, PA-C 07/24/23 1423    Laurence Spates, MD 07/28/23 [phone]

## 2023-07-21 NOTE — ED Triage Notes (Signed)
PT arrived POV from home stating she was told to come see Dr. Juanetta Gosling after having a CT Scan done on 07/19/23. Pt has been dealing with ongoing abdominal pain for several months and was told to come here to discuss the game plan moving forward.

## 2023-07-21 NOTE — Consult Note (Signed)
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 female with a medical history of HTN and HFrEF who has had chronic abdominal pain for about the past year.  It has significantly worsened over the last 3 to 4 months.  The pain is worse with eating and its gotten to the point where she is developed food fear and lost approximately 10 to 15 pounds in the last 3 to 4 months.  She reports she was about 94 pounds prior to her heart failure diagnosis and with diuresis was down to about 85 pounds and today she is 73.  She now reports constant mild abdominal pain that is severe after eating.  She also reports some lower extremity swelling and pain with walking although this is fairly mild.  She denies rest pain or wounds.  She is a current smoker.  Past Medical History:  Diagnosis Date   Heart failure (HCC)    Seizures (HCC)    none in years, recently had medication change    Past Surgical History:  Procedure Laterality Date   COLONOSCOPY     RIGHT/LEFT HEART CATH AND CORONARY ANGIOGRAPHY N/A 11/05/2021   Procedure: RIGHT/LEFT HEART CATH AND CORONARY ANGIOGRAPHY;  Surgeon: Lyn Records, MD;  Location: [facility] INVASIVE CV LAB;  Service: Cardiovascular;  Laterality: N/A;    Allergies  Allergen Reactions   Azithromycin Nausea And Vomiting   Erythromycin     Sensitivity (GI)   Percocet [Oxycodone-Acetaminophen]    Penicillins Itching and Rash    Prior to Admission medications   Medication Sig Start Date End Date Taking? Authorizing Provider  albuterol (PROVENTIL) (2.5 MG/3ML) 0.083% nebulizer solution Take 3 mLs (2.5 mg total) by nebulization every 6 (six) hours as needed for wheezing or shortness of breath. Patient taking differently: Take 2.5 mg by nebulization as needed for wheezing or shortness of breath. 11/06/21   Arrien, York Ram, MD   aspirin 81 MG EC tablet Take 1 tablet (81 mg total) by mouth daily. Swallow whole. 11/06/21   Arrien, York Ram, MD  atorvastatin (LIPITOR) 80 MG tablet TAKE 1 TABLET DAILY AT 6 P.M. (KEEP UPCOMING APPOINTMENT FOR FUTURE REFILLS 05/11/23   Runell Gess, MD  carvedilol (COREG) 3.125 MG tablet TAKE 1 TABLET TWICE A DAY WITH MEALS 01/03/23   Joylene Grapes, NP  estrogens, conjugated, (PREMARIN) 1.25 MG tablet Take 1.25 mg by mouth daily.    [provider]  feeding supplement (ENSURE ENLIVE / ENSURE PLUS) LIQD Take 237 mLs by mouth 2 (two) times daily between meals. 11/06/21   Arrien, York Ram, MD  furosemide (LASIX) 40 MG tablet TAKE ONE-HALF (1/2) TABLET (20 MG TOTAL) DAILY (KEEP UPCOMING APPOINTMENT FOR FUTURE REFILLS) Patient taking differently: Take 20 mg by mouth 2 (two) times daily. 05/31/23   Runell Gess, MD  hydrOXYzine (ATARAX/VISTARIL) 25 MG tablet Take 0.5 tablets (12.5 mg total) by mouth every 8 (eight) hours as needed for itching. Patient not taking: Reported on 07/19/2023 02/11/21   Wallis Bamberg, PA-C  ipratropium-albuterol (DUONEB) 0.5-2.5 (3) MG/3ML SOLN Take 3 mLs by nebulization every 6 (six) hours as needed. Patient not taking: Reported on 07/19/2023 11/06/21   Arrien, York Ram, MD  JARDIANCE 10 MG TABS tablet TAKE 1 TABLET DAILY 01/07/23   Runell Gess, MD  lamoTRIgine (LAMICTAL) 100 MG tablet Take 1 tablet (  100 mg total) by mouth 2 (two) times daily. 04/14/23   Glean Salvo, NP  levothyroxine (SYNTHROID) 25 MCG tablet Take 1 tablet (25 mcg total) by mouth daily before breakfast. 06/24/23   Melida Quitter, PA  losartan (COZAAR) 50 MG tablet TAKE ONE-HALF (1/2) TABLET DAILY 01/24/23   Runell Gess, MD  potassium chloride SA (KLOR-CON M) 20 MEQ tablet TAKE 1 TABLET DAILY (KEEP UPCOMING APPOINTMENT FOR FUTURE REFILLS) 02/28/23   Corrin Parker, PA-C  progesterone (PROMETRIUM) 200 MG capsule Take 200 mg by mouth at bedtime. 04/20/22    [provider]    Social History   Socioeconomic History   Marital status: Significant Other    Spouse name: Rexanne Mano   Number of children: 1   Years of education: Not on file   Highest education level: Bachelor's degree (e.g., BA, AB, BS)  Occupational History   Occupation: retired  Tobacco Use   Smoking status: Some Days    Current packs/day: 0.00    Average packs/day: 1 pack/day for 52.0 years (52.0 ttl pk-yrs)    Types: Cigarettes    Start date: 11/09/1969    Last attempt to quit: 11/09/2021    Years since quitting: 1.6    Passive exposure: Current   Smokeless tobacco: Never   Tobacco comments:    Currently 1 pack every 2-3 days    04/06/2022 patient states when she smokes its 1 cigarette    04/05/2023 Patient smokes 2 cigarettes some days   Vaping Use   Vaping status: Never Used  Substance and Sexual Activity   Alcohol use: Not Currently    Comment: socially   Drug use: No   Sexual activity: Not on file  Other Topics Concern   Not on file  Social History Narrative   Not on file   Social Drivers of Health   Financial Resource Strain: Medium Risk (05/04/2023)   Overall Financial Resource Strain (CARDIA)    Difficulty of Paying Living Expenses: Somewhat hard  Food Insecurity: No Food Insecurity (05/04/2023)   Hunger Vital Sign    Worried About Running Out of Food in the Last Year: Never true    Ran Out of Food in the Last Year: Never true  Transportation Needs: No Transportation Needs (05/04/2023)   PRAPARE - Administrator, Civil Service (Medical): No    Lack of Transportation (Non-Medical): No  Physical Activity: Inactive (03/17/2023)   Exercise Vital Sign    Days of Exercise per Week: 0 days    Minutes of Exercise per Session: 0 min  Stress: Stress Concern Present (05/04/2023)   [name] - Occupational Stress Questionnaire    Feeling of Stress : To some extent  Social Connections: Socially Isolated  (03/17/2023)   Social Connection and Isolation Panel [NHANES]    Frequency of Communication with Friends and Family: More than three times a week    Frequency of Social Gatherings with Friends and Family: More than three times a week    Attends Religious Services: Never    Database administrator or Organizations: No    Attends Banker Meetings: Never    Marital Status: Widowed  Intimate Partner Violence: Not At Risk (03/17/2023)   Humiliation, Afraid, Rape, and Kick questionnaire    Fear of Current or Ex-Partner: No    Emotionally Abused: No    Physically Abused: No    Sexually Abused: No    Family History  Adopted:  Yes  Problem Relation Age of Onset   Breast cancer Mother    Diabetes Mother    Cancer Mother        breast   Stroke Mother    Heart Problems Father    Diabetes Sister    Kidney failure Sister    Breast cancer Maternal Aunt    Lung cancer Half-Sister    Colon cancer Neg Hx    Esophageal cancer Neg Hx    Epilepsy Neg Hx     ROS: Otherwise negative unless mentioned in HPI  Physical Examination  Vitals:   07/21/23 1203  BP: 114/61  Pulse: 77  Resp: 16  Temp: 97.6 F (36.4 C)  SpO2: 100%   Body mass index is 13.79 kg/m.  General: no acute distress, cachectic appearing Cardiac: hemodynamically stable, nontachycardic Pulm: normal work of breathing GI: Scaphoid abdomen, mild generalized tenderness Neuro: alert, no focal deficit Extremities: 1+ edema from ankles to mid shin bilaterally, no wounds Vascular:   Right: Palpable radial, femoral  Left: Palpable radial, nonpalpable femoral   Data:   CT abdomen pelvis performed on 07/19/2023 was independently reviewed Significant atherosclerotic plaque throughout the visceral and infrarenal aorta with severe stenosis versus occlusion of the proximal SMA and moderate stenosis of the ostium of the celiac. 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   ASSESSMENT/PLAN: This is a 67 y.o. female with HFrEF and chronic mesenteric ischemia.  I explained that I talk to my partner Dr. Lenell Antu who has previously reviewed her CT scan and we both agree that she is likely not a candidate for an open bypass.  We discussed the risks and benefits of endovascular intervention with balloon angioplasty and possibly stenting.  Specifically I discussed the risk of access site complication, dissection, thrombosis or distal embolization leading to acute mesenteric ischemia and possibly death.  She expressed understanding and elected to proceed.  Will tentatively plan for mesenteric angiography with intervention on Monday, 07/25/2023 We then discussed performing this as an outpatient versus being admitted for failure to thrive and being evaluated for the need for supplemental nutrition and potentially electrolyte correction.  She expressed that she would like to be admitted for optimization.   Recommendations -Consulting hospital medicine for admission for failure to thrive and malnutrition -Continue aspirin and high intensity statin -Consider consult to dietitian for nutrition optimization  -Will tentatively plan for mesenteric angiogram on 07/25/2023   Daria Pastures MD MS [facility] [phone] 07/21/2023  12:56 PM

## 2023-07-21 NOTE — ED Provider Triage Note (Signed)
Emergency Medicine Provider Triage Evaluation Note  AHNYA SATURDAY , a 67 y.o. female  was evaluated in triage.  Pt complains of patient here with complaint of abdominal pain.  Patient had an outpatient angiogram of the abdomen and was found to have multiple occlusive vessels in the abdomen and was sent in for vascular consult.  She has active abdominal pain at this time.  She continues to smoke  Review of Systems  Positive: Abdominal pain Negative: Of her  Physical Exam  BP 114/61 (BP Location: Right Arm)   Pulse 77   Temp 97.6 F (36.4 C)   Resp 16   SpO2 100%  Gen:   Awake, no distress   Resp:  Normal effort  MSK:   Moves extremities without difficulty  Other:    Medical Decision Making  Medically screening exam initiated at 12:16 PM.  Appropriate orders placed.  [name] was informed that the remainder of the evaluation will be completed by another provider, this initial triage assessment does not replace that evaluation, and the importance of remaining in the ED until their evaluation is complete.  12:20 PM Page sent to on call  VVS Dr. Hetty Blend  12:25 PM Discussed with Dr. Hetty Blend who will come see the patient   Clinical Course as of 07/24/23 1424  Thu Jul 21, 2023  1220 Consult placed to  VVS [AH]  1225 Case discussed with Dr. Hetty Blend who will consult on the patient in triage [AH]  1231 Dr Hetty Blend at bedside [AH]  1418 Comprehensive metabolic panel(!) [AH]  1418 CBC with Differential(!) [AH]  1534 Lipase, blood [AH]  1535 Comprehensive metabolic panel(!) [AH]  1535 CBC with Differential(!) Case discussed with Dr. Ginette Otto who accept the patient for admission [AH]    Clinical Course User Index [AH] Elinor Parkinson, PA-C 07/24/23 1424

## 2023-07-22 DIAGNOSIS — R627 Adult failure to thrive: Secondary | ICD-10-CM | POA: Diagnosis not present

## 2023-07-22 DIAGNOSIS — J4489 Other specified chronic obstructive pulmonary disease: Secondary | ICD-10-CM | POA: Diagnosis not present

## 2023-07-22 DIAGNOSIS — E871 Hypo-osmolality and hyponatremia: Secondary | ICD-10-CM

## 2023-07-22 DIAGNOSIS — F419 Anxiety disorder, unspecified: Secondary | ICD-10-CM | POA: Diagnosis not present

## 2023-07-22 DIAGNOSIS — L8990 Pressure ulcer of unspecified site, unspecified stage: Secondary | ICD-10-CM

## 2023-07-22 DIAGNOSIS — K559 Vascular disorder of intestine, unspecified: Secondary | ICD-10-CM | POA: Diagnosis not present

## 2023-07-22 DIAGNOSIS — K551 Chronic vascular disorders of intestine: Secondary | ICD-10-CM | POA: Diagnosis not present

## 2023-07-22 LAB — COMPREHENSIVE METABOLIC PANEL
ALT: 18 U/L (ref 0–44)
AST: 26 U/L (ref 15–41)
Albumin: 2.5 g/dL — ABNORMAL LOW (ref 3.5–5.0)
Alkaline Phosphatase: 47 U/L (ref 38–126)
Anion gap: 8 (ref 5–15)
BUN: 10 mg/dL (ref 8–23)
CO2: 25 mmol/L (ref 22–32)
Calcium: 8.4 mg/dL — ABNORMAL LOW (ref 8.9–10.3)
Chloride: 97 mmol/L — ABNORMAL LOW (ref 98–111)
Creatinine, Ser: 1.04 mg/dL — ABNORMAL HIGH (ref 0.44–1.00)
GFR, Estimated: 58 mL/min — ABNORMAL LOW (ref >60)
Glucose, Bld: 86 mg/dL (ref 70–99)
Potassium: 3.4 mmol/L — ABNORMAL LOW (ref 3.5–5.1)
Sodium: 130 mmol/L — ABNORMAL LOW (ref 135–145)
Total Bilirubin: 0.6 mg/dL (ref <1.2)
Total Protein: 4.8 g/dL — ABNORMAL LOW (ref 6.5–8.1)

## 2023-07-22 LAB — CBC
HCT: 34.4 % — ABNORMAL LOW (ref 36.0–46.0)
Hemoglobin: 11.9 g/dL — ABNORMAL LOW (ref 12.0–15.0)
MCH: 30.8 pg (ref 26.0–34.0)
MCHC: 34.6 g/dL (ref 30.0–36.0)
MCV: 89.1 fL (ref 80.0–100.0)
Platelets: 159 10*3/uL (ref 150–400)
RBC: 3.86 MIL/uL — ABNORMAL LOW (ref 3.87–5.11)
RDW: 14.1 % (ref 11.5–15.5)
WBC: 9.9 10*3/uL (ref 4.0–10.5)
nRBC: 0 % (ref 0.0–0.2)

## 2023-07-22 LAB — LACTIC ACID, PLASMA: Lactic Acid, Venous: 0.8 mmol/L (ref 0.5–1.9)

## 2023-07-22 MED ORDER — ADULT MULTIVITAMIN W/MINERALS CH
1.0000 | ORAL_TABLET | Freq: Every day | ORAL | Status: DC
Start: 2023-07-22 — End: 2023-07-26
  Administered 2023-07-22 – 2023-07-26 (×4): 1 via ORAL
  Filled 2023-07-22 (×4): qty 1

## 2023-07-22 NOTE — Assessment & Plan Note (Signed)
07-22-2023 likely due to low solute intake. TSH was normal last night. 07-23-2023 Na up to 130 today. Her solute intake has increased. Pt states she had chicken broth last night. 07-24-2023 Na up to 134 today. Essentially resolved. Due to low solute intake.

## 2023-07-22 NOTE — Assessment & Plan Note (Signed)
07-22-2023 pt advised to stop smoking.

## 2023-07-22 NOTE — Assessment & Plan Note (Signed)
07-22-2023, 07-23-2023 stable

## 2023-07-22 NOTE — Progress Notes (Addendum)
[facility] [place]  Subjective  - No new complaints, posy prandial pain continues to be present   Objective (!) 113/53 61 98.7 F (37.1 C) (Oral) 18 97% No intake or output data in the 24 hours ending 07/22/23 0659  General no acute distress Moving all 4 extremities Abdomin soft with slight tenderness Lungs non labored breathing  Assessment/Planning: chronic mesenteric ischemia. Failure to thrive with significant weight loss due to post prandial pain  She denies new symptoms or change in symptoms of post prandial pain.  Will tentatively plan for mesenteric angiogram on 07/25/2023    Mosetta Pigeon 07/22/2023 6:59 AM --  Laboratory Lab Results: Recent Labs    07/21/23 1227 07/22/23 0508  WBC 12.5* 9.9  HGB 14.3 11.9*  HCT 42.7 34.4*  PLT 177 159   BMET Recent Labs    07/21/23 1227 07/22/23 0508  NA 131* 130*  K 3.8 3.4*  CL 92* 97*  CO2 26 25  GLUCOSE 86 86  BUN 16 10  CREATININE 0.91 1.04*  CALCIUM 9.4 8.4*    COAG Lab Results  Component Value Date   INR 1.0 11/05/2021   No results found for: "PTT"   VASCULAR STAFF ADDENDUM: I have independently interviewed and examined the patient. I agree with the above.  Chronic mesenteric ischemia, tolerated soup for dinner. Ok to continue regular diet as tolerated, labs stable with some mild hyponatremia. Plan for mesenteric angiogram on Monday  Daria Pastures MD [facility] Phone Number: [phone] 07/22/2023 8:17 AM

## 2023-07-22 NOTE — Assessment & Plan Note (Signed)
07-22-2023 present on admission. stage 1 to sacrum, blanchable redness to bilateral elbows and heels

## 2023-07-22 NOTE — Assessment & Plan Note (Signed)
07-22-2023, 07-23-2023 continue with lamictal 100 mg bid.

## 2023-07-22 NOTE — Assessment & Plan Note (Signed)
07-22-2023 see RD note dated 07-22-2023. "Severe Malnutrition related to chronic illness as evidenced by meal completion < 25%, severe fat depletion, severe muscle depletion, estimated needs. "

## 2023-07-22 NOTE — Subjective & Objective (Addendum)
Pt seen and examined. Stable. Able to tolerate clear liquids.  Awaiting angiogram tomorrow.

## 2023-07-22 NOTE — Progress Notes (Signed)
PROGRESS NOTE    Christie Moreno  ZOX:[id number] DOB: 10/22/1955 DOA: 07/21/2023 PCP: Melida Quitter, PA  Subjective: Pt seen and examined. Pt relates she has had issues with abd pain with eating for several months. About the time when she was dx'd with CHF.  Pt states she was told her abd pain related to GERD. Pt denies having any belching, acid taste in her mouth.  Pt states it does take her a long time to eat. Pt will notice pain when she is about 15 mins into eating.  She can tolerate clear liquids without any pain.  Vascular surgery consult reviewed. They are planning angiogram on Monday. Pt given option of outpatient angiogram but she declined that option. She will stay in the hospital over the weekend for Monday angiogram.   Hospital Course: HPI: Christie Moreno is a 67 y.o. female with medical history significant of hyperlipidemia, hypothyroidism, CAD, diastolic CHF, malnutrition, seizure disorder, anxiety, COPD presenting with abdominal pain and abnormal CT.   Patient reports about a month of abdominal pain.  She was seen by GI 2 days ago due to this worsening epigastric/periumbilical abdominal pain.  Had some prior issues with constipation in the past but had not had a CT scan for some time.  Had some alternating diarrhea and constipation.   GI sent patient for a CT abdomen pelvis for further evaluation.  This came back yesterday showing loops of bowel  without wall thickening representing possible ileus but no obstruction.  Also noted severe atherosclerosis of the abdomen with severe stenosis of the SMA versus possible occlusion.  Additionally noted some thyroids and recommended an ultrasound for suspected exophytic posterior fibroid.   Given these results, her GI provider, Dr. Leonides Schanz, discussed findings with vascular surgery who recommended close vascular surgery follow-up versus ED evaluation.  Due to progressive abdominal pain it was decided the patient should  present to the ED for further evaluation which she has done today.   Denies fevers, chills, chest pain, shortness of breath, nausea, vomiting.   ED Course: Vital signs in the ED stable.  Lab workup significant for CMP with sodium 131, chloride 92, protein 6.0, albumin 3.2.  CBC with leukocytosis to 12.5.  Lactic acid normal with repeat pending.  Lipase normal.  Recent CT findings as above.  Vascular surgery consulted in the ED and on-call vascular surgeon discussed case with vascular surgeon who reviewed initial imaging.  Plan is for angiogram and possible endovascular intervention on 12/23.  For now recommending aspirin, statin.  Significant Events: Admitted 07/21/2023 for mesenteric ischemia   Significant Labs: sodium 131, chloride 92, protein 6.0, albumin 3.2. CBC with leukocytosis to 12.5. Lactic acid normal   Significant Imaging Studies: Ct abd/pelvis shows Few prominent loops of small bowel in the pelvis have no associated wall thickening. There is no obstruction with enteric contrast reaching distal to this. Findings may represent focal ileus. 2. Advanced abdominal atherosclerosis. High-grade stenosis versus subtotal occlusion at the origin of the SMA which is likely segmentally occluded proximally. There is some distal reconstitution, however the vessels are heavily calcified. 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 Eccentric as well as circumferential mural thrombus in the abdominal aorta. 3. Wall thickening of the distal esophagus with small hiatal hernia. 4. Heterogeneous uterus suggests fibroids. Suspected exophytic fibroid posteriorly measuring 3.3 cm that is enhancing. Recommend nonemergent pelvic ultrasound for further evaluation  Antibiotic Therapy: Anti-infectives (From admission, onward)  None       Procedures:   Consultants: Vascular surgery    Assessment and Plan: * Mesenteric  ischemia (HCC) 07-22-2023 awaiting angiogram by vascular surgery on Monday, Jul 25, 2023.  FTT (failure to thrive) in adult 07-22-2023 BMI 13.79  Pressure injury of skin 07-22-2023 present on admission. stage 1 to sacrum, blanchable redness to bilateral elbows and heels   Coronary artery disease involving native coronary artery of native heart without angina pectoris 07-22-2023 stable  Hypothyroidism 07-22-2023 stable  Hyperlipidemia 07-22-2023 stable  Protein-calorie malnutrition, severe 07-22-2023 see RD note dated 07-22-2023. "Severe Malnutrition related to chronic illness as evidenced by meal completion < 25%, severe fat depletion, severe muscle depletion, estimated needs. "  Tobacco dependence 07-22-2023 pt advised to stop smoking.  COPD with chronic bronchitis (HCC) 07-22-2023 stable  Anxiety 07-22-2023 stable  Partial symptomatic epilepsy with complex partial seizures, not intractable, without status epilepticus (HCC) 07-22-2023 continue with lamictal 100 mg bid.  Hyponatremia 07-22-2023 likely due to low solute intake. TSH was normal last night.       DVT prophylaxis: heparin injection 5,000 Units Start: 07/21/23 2000    Code Status: Full Code Family Communication: no family at bedside Disposition Plan: return home Reason for continuing need for hospitalization: for angiogram on Monday.  Objective: Vitals:   07/21/23 1704 07/21/23 1943 07/22/23 0333 07/22/23 0811  BP: (!) 109/50 (!) 120/46 (!) 113/53 (!) 107/51  Pulse: 69 70 61 62  Resp:  18 18 18   Temp:  98.3 F (36.8 C) 98.7 F (37.1 C) 98.3 F (36.8 C)  TempSrc:  Oral Oral Oral  SpO2:  99% 97% 100%  Weight:      Height:       No intake or output data in the 24 hours ending 07/22/23 1328 Filed Weights   07/21/23 1217  Weight: 33.1 kg    Examination:  Physical Exam Vitals and nursing note reviewed.  Constitutional:      General: She is not in acute distress.    Appearance: She is not  toxic-appearing or diaphoretic.     Comments: Thin and cachetic  HENT:     Head: Normocephalic and atraumatic.     Nose: Nose normal.  Cardiovascular:     Rate and Rhythm: Normal rate and regular rhythm.     Heart sounds: Murmur heard.  Pulmonary:     Effort: Pulmonary effort is normal. No respiratory distress.     Breath sounds: Normal breath sounds. No wheezing.  Abdominal:     General: Abdomen is flat. Bowel sounds are normal. There is no distension.     Palpations: Abdomen is soft.  Musculoskeletal:     Right lower leg: No edema.     Left lower leg: No edema.  Skin:    General: Skin is warm and dry.     Capillary Refill: Capillary refill takes less than 2 seconds.  Neurological:     General: No focal deficit present.     Mental Status: She is alert and oriented to person, place, and time.     Data Reviewed: I have personally reviewed following labs and imaging studies  CBC: Recent Labs  Lab 07/21/23 1227 07/22/23 0508  WBC 12.5* 9.9  NEUTROABS 10.5*  --   HGB 14.3 11.9*  HCT 42.7 34.4*  MCV 89.7 89.1  PLT 177 159   Basic Metabolic Panel: Recent Labs  Lab 07/21/23 1227 07/22/23 0508  NA 131* 130*  K 3.8 3.4*  CL 92* 97*  CO2 26 25  GLUCOSE 86 86  BUN 16 10  CREATININE 0.91 1.04*  CALCIUM 9.4 8.4*   GFR: Estimated Creatinine Clearance: 26.7 mL/min (A) (by C-G formula based on SCr of 1.04 mg/dL (H)). Liver Function Tests: Recent Labs  Lab 07/21/23 1227 07/22/23 0508  AST 33 26  ALT 25 18  ALKPHOS 56 47  BILITOT 0.8 0.6  PROT 6.0* 4.8*  ALBUMIN 3.2* 2.5*   Recent Labs  Lab 07/21/23 1227  LIPASE 26    BNP (last 3 results) Recent Labs    07/10/23 0001  BNP 667.0*    Sepsis Labs: Recent Labs  Lab 07/21/23 1256 07/22/23 0508  LATICACIDVEN 0.9 0.8    Scheduled Meds:  aspirin EC  81 mg Oral Daily   atorvastatin  80 mg Oral QPM   carvedilol  3.125 mg Oral BID WC   feeding supplement  237 mL Oral BID BM   furosemide  20 mg Oral  Daily   heparin  5,000 Units Subcutaneous Q8H   lamoTRIgine  100 mg Oral BID   levothyroxine  25 mcg Oral Q0600   losartan  25 mg Oral Daily   multivitamin with minerals  1 tablet Oral Daily   pneumococcal 20-valent conjugate vaccine  0.5 mL Intramuscular Tomorrow-1000   sodium chloride flush  3 mL Intravenous Q12H   Continuous Infusions:   LOS: 1 day   Time spent: 40 minutes  Carollee Herter, DO  [facility]  07/22/2023, 1:28 PM

## 2023-07-22 NOTE — Progress Notes (Signed)
Initial Nutrition Assessment  DOCUMENTATION CODES:   Severe malnutrition in context of chronic illness  INTERVENTION:  Continue with current diet. Continue with Ensure BID Carnation Breakfast Essentials TID, each packet mixed with 8 ounces of 2% milk provides 13 grams of protein and 260 calories.  Ice cream at lunch and dinner Multivitamin/minerals.    NUTRITION DIAGNOSIS:   Severe Malnutrition related to chronic illness as evidenced by meal completion < 25%, severe fat depletion, severe muscle depletion, estimated needs.    GOAL:   Patient will meet greater than or equal to 90% of their needs    MONITOR:   PO intake, Supplement acceptance, Weight trends, Labs  REASON FOR ASSESSMENT:   Consult Assessment of nutrition requirement/status  ASSESSMENT:  67 y.o. F, Presented after presented with worsening abdominal pain and abnormal CT. Admitted with Mesenteric ischemia. Was seen by GI on 17th due to worsening epigastric/periumbilical abdominal pain. CT revealed: loops of bowel without wall thickening representing possible ileus but no obstruction. Also noted severe atherosclerosis of the abdomen with severe stenosis of the SMA versus possible occlusion. Additionally noted some thyroids and recommended an ultrasound for suspected exophytic posterior fibroid. PMH; HLD hypothyroidism, CAD diastolic CHF, malnutrition, seizure disorder Review of EMR revealed.  Declined oral intake, 10.5% weight loss in 90 days. No chewing or swallowing concerns, Independent feeding ability.  Pt very pleasant. Stated weight loss over the last 3 months. She said that when eats abdominal pain. She reports trying the elimination diet and that is when she started losing moore weight. She reports fluids do not cause pain like food does. She stated that she has always been very small but endorses weight loss. She said she ate all of her breakfast this a.m. and now stomach grumbling. Family member brought in  soup from [employer] garden yesterday evening, reporting eating about 50% of it. She is not found of Ensures but will continue to drink them.  Discussed Carnation instant breakfast and agreed to drinking BID. Was excited about receiving Ice cream with meals also.   Admit weight: 33.1 kg  Weight history:  07/21/23 33.1 kg  07/19/23 33.7 kg  07/09/23 34.5 kg  06/13/23 34.5 kg  05/06/23 35.5 kg  04/14/23 37 kg  04/13/23 36.3 kg  04/05/23 35.9 kg  03/17/23 36.7 kg  02/09/23 37.6 kg    Average Meal Intake: No current meal documentation   Nutritionally Relevant Medications: Scheduled Meds:  atorvastatin  80 mg Oral QPM   carvedilol  3.125 mg Oral BID WC   feeding supplement  237 mL Oral BID BM   furosemide  20 mg Oral Daily    Labs Reviewed:  HgbA1c 5.9 (06/10/23)    NUTRITION - FOCUSED PHYSICAL EXAM:  Flowsheet Row Most Recent Value  Orbital Region Severe depletion  Upper Arm Region Severe depletion  Thoracic and Lumbar Region Severe depletion  Buccal Region Severe depletion  Temple Region Severe depletion  Clavicle Bone Region Severe depletion  Clavicle and Acromion Bone Region Severe depletion  Scapular Bone Region Severe depletion  Dorsal Hand Severe depletion  Patellar Region Severe depletion  Anterior Thigh Region Severe depletion  Posterior Calf Region Severe depletion  Edema (RD Assessment) Mild  Hair Reviewed  Eyes Reviewed  Mouth Reviewed  Skin Reviewed  Nails Reviewed       Diet Order:   Diet Order             Diet regular Room service appropriate? Yes; Fluid consistency: Thin  Diet effective now  EDUCATION NEEDS:   Education needs have been addressed  Skin:  Skin Assessment: Skin Integrity Issues: Skin Integrity Issues:: Stage I Stage I: Sacrum  Last BM:  PTA  Height:   Ht Readings from Last 1 Encounters:  07/21/23 5\' 1"  (1.549 m)    Weight:   Wt Readings from Last 1 Encounters:  07/21/23 33.1 kg    Ideal  Body Weight:     BMI:  Body mass index is 13.79 kg/m.  Estimated Nutritional Needs:   Kcal:  1500-1750 kcal/d  Protein:  65-75 g/d  Fluid:  32ml/kcal    Jamelle Haring RDN, LDN Clinical Dietitian  Pleas see Amion for contact information

## 2023-07-22 NOTE — Progress Notes (Signed)
Transition of Care [facility]) - Inpatient Brief Assessment   Patient Details  Name: Christie Moreno MRN: [id number] Date of Birth: January 26, 1956  Transition of Care [facility]) CM/SW Contact:    Janae Bridgeman, RN Phone Number: 07/22/2023, 3:18 PM   Clinical Narrative: Patient admitted to the hospital for Mesenteric ischemia.  Patient is independent and lives with family at the home.  Patient has planned mesenteric angiogram on 07/25/2023 per vascular note.  No TOC needs at this time.  Transition of Care Asessment: Insurance and Status: (P) Insurance coverage has been reviewed Patient has primary care physician: (P) Yes Home environment has been reviewed: (P) From home with family Prior level of function:: (P) Independent Prior/Current Home Services: (P) No current home services Social Drivers of Health Review: (P) SDOH reviewed interventions complete Readmission risk has been reviewed: (P) Yes Transition of care needs: (P) transition of care needs identified, TOC will continue to follow

## 2023-07-22 NOTE — Assessment & Plan Note (Signed)
07-22-2023 BMI 13.79

## 2023-07-22 NOTE — Hospital Course (Signed)
HPI: Christie Moreno is a 67 y.o. female with medical history significant of hyperlipidemia, hypothyroidism, CAD, diastolic CHF, malnutrition, seizure disorder, anxiety, COPD presenting with abdominal pain and abnormal CT.   Patient reports about a month of abdominal pain.  She was seen by GI 2 days ago due to this worsening epigastric/periumbilical abdominal pain.  Had some prior issues with constipation in the past but had not had a CT scan for some time.  Had some alternating diarrhea and constipation.   GI sent patient for a CT abdomen pelvis for further evaluation.  This came back yesterday showing loops of bowel  without wall thickening representing possible ileus but no obstruction.  Also noted severe atherosclerosis of the abdomen with severe stenosis of the SMA versus possible occlusion.  Additionally noted some thyroids and recommended an ultrasound for suspected exophytic posterior fibroid.   Given these results, her GI provider, Dr. Leonides Schanz, discussed findings with vascular surgery who recommended close vascular surgery follow-up versus ED evaluation.  Due to progressive abdominal pain it was decided the patient should present to the ED for further evaluation which she has done today.   Denies fevers, chills, chest pain, shortness of breath, nausea, vomiting.   ED Course: Vital signs in the ED stable.  Lab workup significant for CMP with sodium 131, chloride 92, protein 6.0, albumin 3.2.  CBC with leukocytosis to 12.5.  Lactic acid normal with repeat pending.  Lipase normal.  Recent CT findings as above.  Vascular surgery consulted in the ED and on-call vascular surgeon discussed case with vascular surgeon who reviewed initial imaging.  Plan is for angiogram and possible endovascular intervention on 12/23.  For now recommending aspirin, statin.  Significant Events: Admitted 07/21/2023 for mesenteric ischemia   Significant Labs: sodium 131, chloride 92, protein 6.0, albumin 3.2. CBC  with leukocytosis to 12.5. Lactic acid normal   Significant Imaging Studies: Ct abd/pelvis shows Few prominent loops of small bowel in the pelvis have no associated wall thickening. There is no obstruction with enteric contrast reaching distal to this. Findings may represent focal ileus. 2. Advanced abdominal atherosclerosis. High-grade stenosis versus subtotal occlusion at the origin of the SMA which is likely segmentally occluded proximally. There is some distal reconstitution, however the vessels are heavily calcified. 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 Eccentric as well as circumferential mural thrombus in the abdominal aorta. 3. Wall thickening of the distal esophagus with small hiatal hernia. 4. Heterogeneous uterus suggests fibroids. Suspected exophytic fibroid posteriorly measuring 3.3 cm that is enhancing. Recommend nonemergent pelvic ultrasound for further evaluation  Antibiotic Therapy: Anti-infectives (From admission, onward)    None       Procedures:   Consultants: Vascular surgery

## 2023-07-22 NOTE — Assessment & Plan Note (Signed)
07-22-2023, 07-23-2023 awaiting angiogram by vascular surgery on Monday, Jul 25, 2023.

## 2023-07-23 DIAGNOSIS — E039 Hypothyroidism, unspecified: Secondary | ICD-10-CM | POA: Diagnosis not present

## 2023-07-23 DIAGNOSIS — E871 Hypo-osmolality and hyponatremia: Secondary | ICD-10-CM | POA: Diagnosis not present

## 2023-07-23 DIAGNOSIS — E876 Hypokalemia: Secondary | ICD-10-CM | POA: Insufficient documentation

## 2023-07-23 DIAGNOSIS — R627 Adult failure to thrive: Secondary | ICD-10-CM | POA: Diagnosis not present

## 2023-07-23 DIAGNOSIS — K559 Vascular disorder of intestine, unspecified: Secondary | ICD-10-CM | POA: Diagnosis not present

## 2023-07-23 MED ORDER — POTASSIUM CHLORIDE CRYS ER 20 MEQ PO TBCR
40.0000 meq | EXTENDED_RELEASE_TABLET | Freq: Once | ORAL | Status: AC
  Administered 2023-07-23: 40 meq via ORAL
  Filled 2023-07-23: qty 2

## 2023-07-23 NOTE — Assessment & Plan Note (Signed)
07-23-2023 K 3.4, will give oral Kcl replacement.

## 2023-07-23 NOTE — Progress Notes (Signed)
PROGRESS NOTE    Christie Moreno  GLO:[id number] DOB: 16-Jul-1956 DOA: 07/21/2023 PCP: Melida Quitter, PA  Subjective: Pt seen and examined. Stable overnight. No complaints. I went to pantry and got her some various juices, gatorade and [place] ice. Pt states that eating makes her stomach hurt.   Hospital Course: HPI: Christie Moreno is a 67 y.o. female with medical history significant of hyperlipidemia, hypothyroidism, CAD, diastolic CHF, malnutrition, seizure disorder, anxiety, COPD presenting with abdominal pain and abnormal CT.   Patient reports about a month of abdominal pain.  She was seen by GI 2 days ago due to this worsening epigastric/periumbilical abdominal pain.  Had some prior issues with constipation in the past but had not had a CT scan for some time.  Had some alternating diarrhea and constipation.   GI sent patient for a CT abdomen pelvis for further evaluation.  This came back yesterday showing loops of bowel  without wall thickening representing possible ileus but no obstruction.  Also noted severe atherosclerosis of the abdomen with severe stenosis of the SMA versus possible occlusion.  Additionally noted some thyroids and recommended an ultrasound for suspected exophytic posterior fibroid.   Given these results, her GI provider, Dr. Leonides Schanz, discussed findings with vascular surgery who recommended close vascular surgery follow-up versus ED evaluation.  Due to progressive abdominal pain it was decided the patient should present to the ED for further evaluation which she has done today.   Denies fevers, chills, chest pain, shortness of breath, nausea, vomiting.   ED Course: Vital signs in the ED stable.  Lab workup significant for CMP with sodium 131, chloride 92, protein 6.0, albumin 3.2.  CBC with leukocytosis to 12.5.  Lactic acid normal with repeat pending.  Lipase normal.  Recent CT findings as above.  Vascular surgery consulted in the ED and on-call vascular  surgeon discussed case with vascular surgeon who reviewed initial imaging.  Plan is for angiogram and possible endovascular intervention on 12/23.  For now recommending aspirin, statin.  Significant Events: Admitted 07/21/2023 for mesenteric ischemia   Significant Labs: sodium 131, chloride 92, protein 6.0, albumin 3.2. CBC with leukocytosis to 12.5. Lactic acid normal   Significant Imaging Studies: Ct abd/pelvis shows Few prominent loops of small bowel in the pelvis have no associated wall thickening. There is no obstruction with enteric contrast reaching distal to this. Findings may represent focal ileus. 2. Advanced abdominal atherosclerosis. High-grade stenosis versus subtotal occlusion at the origin of the SMA which is likely segmentally occluded proximally. There is some distal reconstitution, however the vessels are heavily calcified. 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 Eccentric as well as circumferential mural thrombus in the abdominal aorta. 3. Wall thickening of the distal esophagus with small hiatal hernia. 4. Heterogeneous uterus suggests fibroids. Suspected exophytic fibroid posteriorly measuring 3.3 cm that is enhancing. Recommend nonemergent pelvic ultrasound for further evaluation  Antibiotic Therapy: Anti-infectives (From admission, onward)    None       Procedures:   Consultants: Vascular surgery    Assessment and Plan: * Mesenteric ischemia (HCC) 07-22-2023, 07-23-2023 awaiting angiogram by vascular surgery on Monday, Jul 25, 2023.  Hyponatremia 07-22-2023 likely due to low solute intake. TSH was normal last night. 07-23-2023 Na up to 130 today. Her solute intake has increased. Pt states she had chicken broth last night.  FTT (failure to thrive) in adult 07-22-2023 BMI 13.79  Pressure injury of skin  07-22-2023 present on admission. stage 1 to sacrum, blanchable redness to  bilateral elbows and heels   Coronary artery disease involving native coronary artery of native heart without angina pectoris 07-22-2023, 07-23-2023 stable  Hypothyroidism 07-22-2023, 07-23-2023 stable. Remains on synthroid 25 mcg daily.  Hyperlipidemia 07-22-2023, 07-23-2023 stable  Protein-calorie malnutrition, severe 07-22-2023 see RD note dated 07-22-2023. "Severe Malnutrition related to chronic illness as evidenced by meal completion < 25%, severe fat depletion, severe muscle depletion, estimated needs. "  Tobacco dependence 07-22-2023 pt advised to stop smoking.  COPD with chronic bronchitis (HCC) 07-22-2023, 07-23-2023 stable  Anxiety 07-22-2023, 07-23-2023 stable  Partial symptomatic epilepsy with complex partial seizures, not intractable, without status epilepticus (HCC) 07-22-2023, 07-23-2023 continue with lamictal 100 mg bid.  Hypokalemia 07-23-2023 K 3.4, will give oral Kcl replacement.       DVT prophylaxis: Place and maintain sequential compression device Start: 07/22/23 1333 pt refused heparin SQ. Pt advised to ambulate today to prevent DVTs    Code Status: Full Code Family Communication: no family at bedside Disposition Plan: return home Reason for continuing need for hospitalization: awaiting mesenteric angiogram on Monday. 07-25-2023.  Objective: Vitals:   07/23/23 0415 07/23/23 0500 07/23/23 0752 07/23/23 0847  BP: (!) 106/45  (!) 139/55   Pulse: 70  62 67  Resp: 18  17   Temp: 97.7 F (36.5 C)  98 F (36.7 C)   TempSrc: Oral  Oral   SpO2: 94%  100%   Weight:  34.9 kg    Height:        Intake/Output Summary (Last 24 hours) at 07/23/2023 1025 Last data filed at 07/22/2023 2239 Gross per 24 hour  Intake 3 ml  Output --  Net 3 ml   Filed Weights   07/21/23 1217 07/23/23 0500  Weight: 33.1 kg 34.9 kg    Examination:  Physical Exam Vitals and nursing note reviewed.  Constitutional:      General: She is not in acute distress.     Appearance: She is not toxic-appearing or diaphoretic.     Comments: Thin cachetic female. No distress  HENT:     Head: Normocephalic and atraumatic.     Nose: Nose normal.  Eyes:     General: No scleral icterus. Cardiovascular:     Rate and Rhythm: Normal rate and regular rhythm.  Pulmonary:     Effort: Pulmonary effort is normal. No respiratory distress.     Breath sounds: Normal breath sounds.  Abdominal:     General: Abdomen is flat. Bowel sounds are normal.  Musculoskeletal:     Right lower leg: No edema.     Left lower leg: No edema.  Skin:    General: Skin is warm and dry.     Capillary Refill: Capillary refill takes less than 2 seconds.  Neurological:     General: No focal deficit present.     Mental Status: She is alert and oriented to person, place, and time.     Data Reviewed: I have personally reviewed following labs and imaging studies  CBC: Recent Labs  Lab 07/21/23 1227 07/22/23 0508  WBC 12.5* 9.9  NEUTROABS 10.5*  --   HGB 14.3 11.9*  HCT 42.7 34.4*  MCV 89.7 89.1  PLT 177 159   Basic Metabolic Panel: Recent Labs  Lab 07/21/23 1227 07/22/23 0508  NA 131* 130*  K 3.8 3.4*  CL 92* 97*  CO2 26 25  GLUCOSE 86 86  BUN 16 10  CREATININE 0.91 1.04*  CALCIUM 9.4 8.4*   GFR: Estimated Creatinine Clearance: 28.1 mL/min (A) (by C-G formula based on SCr of 1.04 mg/dL (H)). Liver Function Tests: Recent Labs  Lab 07/21/23 1227 07/22/23 0508  AST 33 26  ALT 25 18  ALKPHOS 56 47  BILITOT 0.8 0.6  PROT 6.0* 4.8*  ALBUMIN 3.2* 2.5*   Recent Labs  Lab 07/21/23 1227  LIPASE 26   BNP (last 3 results) Recent Labs    07/10/23 0001  BNP 667.0*   Sepsis Labs: Recent Labs  Lab 07/21/23 1256 07/22/23 0508  LATICACIDVEN 0.9 0.8   Radiology Studies: No results found.  Scheduled Meds:  aspirin EC  81 mg Oral Daily   atorvastatin  80 mg Oral QPM   carvedilol  3.125 mg Oral BID WC   feeding supplement  237 mL Oral BID BM   furosemide  20  mg Oral Daily   lamoTRIgine  100 mg Oral BID   levothyroxine  25 mcg Oral Q0600   losartan  25 mg Oral Daily   multivitamin with minerals  1 tablet Oral Daily   pneumococcal 20-valent conjugate vaccine  0.5 mL Intramuscular Tomorrow-1000   potassium chloride  40 mEq Oral Once   sodium chloride flush  3 mL Intravenous Q12H   Continuous Infusions:   LOS: 2 days   Time spent: 40 minutes  Carollee Herter, DO  [facility]  07/23/2023, 10:25 AM

## 2023-07-24 DIAGNOSIS — J4489 Other specified chronic obstructive pulmonary disease: Secondary | ICD-10-CM | POA: Diagnosis not present

## 2023-07-24 DIAGNOSIS — E871 Hypo-osmolality and hyponatremia: Secondary | ICD-10-CM | POA: Diagnosis not present

## 2023-07-24 DIAGNOSIS — R627 Adult failure to thrive: Secondary | ICD-10-CM | POA: Diagnosis not present

## 2023-07-24 DIAGNOSIS — K559 Vascular disorder of intestine, unspecified: Secondary | ICD-10-CM | POA: Diagnosis not present

## 2023-07-24 DIAGNOSIS — K551 Chronic vascular disorders of intestine: Secondary | ICD-10-CM | POA: Diagnosis not present

## 2023-07-24 LAB — COMPREHENSIVE METABOLIC PANEL
ALT: 19 U/L (ref 0–44)
AST: 26 U/L (ref 15–41)
Albumin: 2.6 g/dL — ABNORMAL LOW (ref 3.5–5.0)
Alkaline Phosphatase: 46 U/L (ref 38–126)
Anion gap: 7 (ref 5–15)
BUN: 8 mg/dL (ref 8–23)
CO2: 28 mmol/L (ref 22–32)
Calcium: 8.6 mg/dL — ABNORMAL LOW (ref 8.9–10.3)
Chloride: 99 mmol/L (ref 98–111)
Creatinine, Ser: 1.07 mg/dL — ABNORMAL HIGH (ref 0.44–1.00)
GFR, Estimated: 56 mL/min — ABNORMAL LOW (ref >60)
Glucose, Bld: 85 mg/dL (ref 70–99)
Potassium: 4 mmol/L (ref 3.5–5.1)
Sodium: 134 mmol/L — ABNORMAL LOW (ref 135–145)
Total Bilirubin: 0.5 mg/dL (ref <1.2)
Total Protein: 5.1 g/dL — ABNORMAL LOW (ref 6.5–8.1)

## 2023-07-24 MED ORDER — FUROSEMIDE 20 MG PO TABS
20.0000 mg | ORAL_TABLET | Freq: Every day | ORAL | Status: DC
  Administered 2023-07-26: 20 mg via ORAL
  Filled 2023-07-24: qty 1

## 2023-07-24 MED ORDER — LOSARTAN POTASSIUM 25 MG PO TABS
25.0000 mg | ORAL_TABLET | Freq: Every day | ORAL | Status: DC
  Administered 2023-07-26: 25 mg via ORAL
  Filled 2023-07-24: qty 1

## 2023-07-24 NOTE — Plan of Care (Signed)

## 2023-07-24 NOTE — Progress Notes (Signed)
PROGRESS NOTE    Christie Moreno  UXL:[id number] DOB: 1955/11/14 DOA: 07/21/2023 PCP: Melida Quitter, PA  Subjective: Pt seen and examined. Stable. Able to tolerate clear liquids.  Awaiting angiogram tomorrow.   Hospital Course: HPI: Christie Moreno is a 67 y.o. female with medical history significant of hyperlipidemia, hypothyroidism, CAD, diastolic CHF, malnutrition, seizure disorder, anxiety, COPD presenting with abdominal pain and abnormal CT.   Patient reports about a month of abdominal pain.  She was seen by GI 2 days ago due to this worsening epigastric/periumbilical abdominal pain.  Had some prior issues with constipation in the past but had not had a CT scan for some time.  Had some alternating diarrhea and constipation.   GI sent patient for a CT abdomen pelvis for further evaluation.  This came back yesterday showing loops of bowel  without wall thickening representing possible ileus but no obstruction.  Also noted severe atherosclerosis of the abdomen with severe stenosis of the SMA versus possible occlusion.  Additionally noted some thyroids and recommended an ultrasound for suspected exophytic posterior fibroid.   Given these results, her GI provider, Dr. Leonides Schanz, discussed findings with vascular surgery who recommended close vascular surgery follow-up versus ED evaluation.  Due to progressive abdominal pain it was decided the patient should present to the ED for further evaluation which she has done today.   Denies fevers, chills, chest pain, shortness of breath, nausea, vomiting.   ED Course: Vital signs in the ED stable.  Lab workup significant for CMP with sodium 131, chloride 92, protein 6.0, albumin 3.2.  CBC with leukocytosis to 12.5.  Lactic acid normal with repeat pending.  Lipase normal.  Recent CT findings as above.  Vascular surgery consulted in the ED and on-call vascular surgeon discussed case with vascular surgeon who reviewed initial imaging.  Plan is for  angiogram and possible endovascular intervention on 12/23.  For now recommending aspirin, statin.  Significant Events: Admitted 07/21/2023 for mesenteric ischemia   Significant Labs: sodium 131, chloride 92, protein 6.0, albumin 3.2. CBC with leukocytosis to 12.5. Lactic acid normal   Significant Imaging Studies: Ct abd/pelvis shows Few prominent loops of small bowel in the pelvis have no associated wall thickening. There is no obstruction with enteric contrast reaching distal to this. Findings may represent focal ileus. 2. Advanced abdominal atherosclerosis. High-grade stenosis versus subtotal occlusion at the origin of the SMA which is likely segmentally occluded proximally. There is some distal reconstitution, however the vessels are heavily calcified. 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 Eccentric as well as circumferential mural thrombus in the abdominal aorta. 3. Wall thickening of the distal esophagus with small hiatal hernia. 4. Heterogeneous uterus suggests fibroids. Suspected exophytic fibroid posteriorly measuring 3.3 cm that is enhancing. Recommend nonemergent pelvic ultrasound for further evaluation  Antibiotic Therapy: Anti-infectives (From admission, onward)    None       Procedures:   Consultants: Vascular surgery    Assessment and Plan: * Mesenteric ischemia (HCC) 07-22-2023 thru 07-24-2023 awaiting angiogram by vascular surgery on Monday, Jul 25, 2023.  Hyponatremia 07-22-2023 likely due to low solute intake. TSH was normal last night. 07-23-2023 Na up to 130 today. Her solute intake has increased. Pt states she had chicken broth last night. 07-24-2023 Na up to 134 today. Essentially resolved. Due to low solute intake.  FTT (failure to thrive) in adult 07-22-2023 BMI 13.79  Pressure injury of skin 07-22-2023 present  on admission. stage 1 to sacrum, blanchable redness to  bilateral elbows and heels   Coronary artery disease involving native coronary artery of native heart without angina pectoris 07-22-2023 thru 07-24-2023 stable  Hypothyroidism 07-22-2023 thru 07-24-2023 stable. Remains on synthroid 25 mcg daily.  Hyperlipidemia 07-22-2023 thru 07-24-2023 stable  Protein-calorie malnutrition, severe 07-22-2023 see RD note dated 07-22-2023. "Severe Malnutrition related to chronic illness as evidenced by meal completion < 25%, severe fat depletion, severe muscle depletion, estimated needs. "  Tobacco dependence 07-22-2023 pt advised to stop smoking.  COPD with chronic bronchitis (HCC) 07-22-2023 thru 07-24-2023 stable  Anxiety 07-22-2023 thru 07-24-2023 stable  Partial symptomatic epilepsy with complex partial seizures, not intractable, without status epilepticus (HCC) 07-22-2023 thru 07-24-2023 continue with lamictal 100 mg bid.  Hypokalemia 07-23-2023 K 3.4, will give oral Kcl replacement.   DVT prophylaxis: Place and maintain sequential compression device Start: 07/22/23 1333    Code Status: Full Code Family Communication: no family at bedside Disposition Plan: return home Reason for continuing need for hospitalization: for angiogram tomorrow.  Objective: Vitals:   07/23/23 2022 07/24/23 0500 07/24/23 0529 07/24/23 0756  BP: (!) 137/54  120/70 (!) 129/58  Pulse: 67  62 63  Resp: 16  18 18   Temp: 99.4 F (37.4 C)  (!) 97.3 F (36.3 C) 98.2 F (36.8 C)  TempSrc: Oral  Oral Oral  SpO2: 100%  98% 96%  Weight:  34.6 kg    Height:       No intake or output data in the 24 hours ending 07/24/23 1224 Filed Weights   07/21/23 1217 07/23/23 0500 07/24/23 0500  Weight: 33.1 kg 34.9 kg 34.6 kg    Examination:  Physical Exam Vitals and nursing note reviewed.  Constitutional:      General: She is not in acute distress.    Appearance: She is not toxic-appearing or diaphoretic.     Comments: underweight  HENT:     Head: Normocephalic  and atraumatic.     Nose: Nose normal.  Cardiovascular:     Rate and Rhythm: Normal rate and regular rhythm.  Pulmonary:     Effort: Pulmonary effort is normal.     Breath sounds: Normal breath sounds.  Abdominal:     General: Abdomen is flat.  Skin:    General: Skin is dry.     Capillary Refill: Capillary refill takes less than 2 seconds.  Neurological:     Mental Status: She is alert and oriented to person, place, and time.     Data Reviewed: I have personally reviewed following labs and imaging studies  CBC: Recent Labs  Lab 07/21/23 1227 07/22/23 0508  WBC 12.5* 9.9  NEUTROABS 10.5*  --   HGB 14.3 11.9*  HCT 42.7 34.4*  MCV 89.7 89.1  PLT 177 159   Basic Metabolic Panel: Recent Labs  Lab 07/21/23 1227 07/22/23 0508 07/24/23 0836  NA 131* 130* 134*  K 3.8 3.4* 4.0  CL 92* 97* 99  CO2 26 25 28   GLUCOSE 86 86 85  BUN 16 10 8   CREATININE 0.91 1.04* 1.07*  CALCIUM 9.4 8.4* 8.6*   GFR: Estimated Creatinine Clearance: 27.1 mL/min (A) (by C-G formula based on SCr of 1.07 mg/dL (H)). Liver Function Tests: Recent Labs  Lab 07/21/23 1227 07/22/23 0508 07/24/23 0836  AST 33 26 26  ALT 25 18 19   ALKPHOS 56 47 46  BILITOT 0.8 0.6 0.5  PROT 6.0* 4.8* 5.1*  ALBUMIN 3.2* 2.5* 2.6*  Recent Labs  Lab 07/21/23 1227  LIPASE 26   BNP (last 3 results) Recent Labs    07/10/23 0001  BNP 667.0*   Sepsis Labs: Recent Labs  Lab 07/21/23 1256 07/22/23 0508  LATICACIDVEN 0.9 0.8   Scheduled Meds:  aspirin EC  81 mg Oral Daily   atorvastatin  80 mg Oral QPM   carvedilol  3.125 mg Oral BID WC   feeding supplement  237 mL Oral BID BM   furosemide  20 mg Oral Daily   lamoTRIgine  100 mg Oral BID   levothyroxine  25 mcg Oral Q0600   losartan  25 mg Oral Daily   multivitamin with minerals  1 tablet Oral Daily   pneumococcal 20-valent conjugate vaccine  0.5 mL Intramuscular Tomorrow-1000   sodium chloride flush  3 mL Intravenous Q12H   Continuous  Infusions:   LOS: 3 days   Time spent: 40 minutes  Carollee Herter, DO  [facility]  07/24/2023, 12:24 PM

## 2023-07-24 NOTE — Progress Notes (Addendum)
  Progress Note    07/24/2023 11:51 AM * No surgery found *  Subjective:  ongoing post prandial pain.  Eating less since hospitalization   Vitals:   07/24/23 0529 07/24/23 0756  BP: 120/70 (!) 129/58  Pulse: 62 63  Resp: 18 18  Temp: (!) 97.3 F (36.3 C) 98.2 F (36.8 C)  SpO2: 98% 96%   Physical Exam: Lungs:  non labored Extremities:  moving all ext well Abdomen:  soft, NT, ND Neurologic: A&O  CBC    Component Value Date/Time   WBC 9.9 07/22/2023 0508   RBC 3.86 (L) 07/22/2023 0508   HGB 11.9 (L) 07/22/2023 0508   HGB 13.7 06/10/2023 0926   HCT 34.4 (L) 07/22/2023 0508   HCT 42.6 06/10/2023 0926   PLT 159 07/22/2023 0508   PLT 171 06/10/2023 0926   MCV 89.1 07/22/2023 0508   MCV 93 06/10/2023 0926   MCH 30.8 07/22/2023 0508   MCHC 34.6 07/22/2023 0508   RDW 14.1 07/22/2023 0508   RDW 13.5 06/10/2023 0926   LYMPHSABS 1.2 07/21/2023 1227   LYMPHSABS 1.7 06/10/2023 0926   MONOABS 0.7 07/21/2023 1227   EOSABS 0.0 07/21/2023 1227   EOSABS 0.2 06/10/2023 0926   BASOSABS 0.0 07/21/2023 1227   BASOSABS 0.0 06/10/2023 0926    BMET    Component Value Date/Time   NA 134 (L) 07/24/2023 0836   NA 136 06/10/2023 0926   K 4.0 07/24/2023 0836   CL 99 07/24/2023 0836   CO2 28 07/24/2023 0836   GLUCOSE 85 07/24/2023 0836   BUN 8 07/24/2023 0836   BUN 21 06/10/2023 0926   CREATININE 1.07 (H) 07/24/2023 0836   CALCIUM 8.6 (L) 07/24/2023 0836   GFRNONAA 56 (L) 07/24/2023 0836   GFRAA 88 04/23/2019 1021    INR    Component Value Date/Time   INR 1.0 11/05/2021 0114    No intake or output data in the 24 hours ending 07/24/23 1151   Assessment/Plan:  67 y.o. female with chronic mesenteric ischemia  Ongoing post prandial pain and poor oral intake Plan is for aortogram with possible mesenteric intervention with Dr. Hetty Blend tomorrow 12/23.  Case discussed in detail including risks.  All questions answered.  NPO past midnight.  Consent ordered.   Emilie Rutter, PA-C [facility] [phone] 07/24/2023 11:51 AM  I agree with the above.  Plan for angio tomorrow  Durene Cal

## 2023-07-25 ENCOUNTER — Ambulatory Visit: Payer: Medicare Other | Admitting: Cardiovascular Disease

## 2023-07-25 ENCOUNTER — Encounter (HOSPITAL_COMMUNITY)
Admission: EM | Disposition: A | Discharge status: Home / Self Care | Payer: Self-pay | Source: Home / Self Care | Attending: Internal Medicine

## 2023-07-25 ENCOUNTER — Ambulatory Visit (HOSPITAL_COMMUNITY): Admission: RE | Admit: 2023-07-25 | Payer: Medicare Other | Source: Home / Self Care | Admitting: Vascular Surgery

## 2023-07-25 DIAGNOSIS — K551 Chronic vascular disorders of intestine: Secondary | ICD-10-CM | POA: Diagnosis not present

## 2023-07-25 DIAGNOSIS — K559 Vascular disorder of intestine, unspecified: Secondary | ICD-10-CM | POA: Diagnosis not present

## 2023-07-25 HISTORY — PX: PERIPHERAL VASCULAR INTERVENTION: CATH118257

## 2023-07-25 HISTORY — PX: PERIPHERAL INTRAVASCULAR LITHOTRIPSY: CATH118324

## 2023-07-25 HISTORY — PX: VISCERAL ANGIOGRAPHY: CATH118276

## 2023-07-25 LAB — RENAL FUNCTION PANEL
Albumin: 2.4 g/dL — ABNORMAL LOW (ref 3.5–5.0)
Anion gap: 7 (ref 5–15)
BUN: 10 mg/dL (ref 8–23)
CO2: 28 mmol/L (ref 22–32)
Calcium: 8.4 mg/dL — ABNORMAL LOW (ref 8.9–10.3)
Chloride: 99 mmol/L (ref 98–111)
Creatinine, Ser: 0.94 mg/dL (ref 0.44–1.00)
GFR, Estimated: >60 mL/min (ref >60)
Glucose, Bld: 89 mg/dL (ref 70–99)
Phosphorus: 2.7 mg/dL (ref 2.5–4.6)
Potassium: 4 mmol/L (ref 3.5–5.1)
Sodium: 134 mmol/L — ABNORMAL LOW (ref 135–145)

## 2023-07-25 SURGERY — VISCERAL ANGIOGRAPHY
Anesthesia: LOCAL

## 2023-07-25 MED ORDER — HYDRALAZINE HCL 20 MG/ML IJ SOLN: 5.0000 mg | INTRAMUSCULAR | Status: DC | PRN

## 2023-07-25 MED ORDER — ACETAMINOPHEN 325 MG PO TABS
ORAL_TABLET | ORAL | Status: AC
  Administered 2023-07-25: 650 mg via ORAL
  Filled 2023-07-25: qty 2

## 2023-07-25 MED ORDER — ONDANSETRON HCL 4 MG/2ML IJ SOLN: 4.0000 mg | Freq: Four times a day (QID) | INTRAMUSCULAR | Status: DC | PRN

## 2023-07-25 MED ORDER — FENTANYL CITRATE (PF) 100 MCG/2ML IJ SOLN
INTRAMUSCULAR | Status: DC | PRN
  Administered 2023-07-25 (×2): 25 ug via INTRAVENOUS

## 2023-07-25 MED ORDER — SODIUM CHLORIDE 0.9 % WEIGHT BASED INFUSION: 1.0000 mL/kg/h | INTRAVENOUS | Status: AC

## 2023-07-25 MED ORDER — CLOPIDOGREL BISULFATE 300 MG PO TABS
ORAL_TABLET | ORAL | Status: DC | PRN
  Administered 2023-07-25: 300 mg via ORAL

## 2023-07-25 MED ORDER — FENTANYL CITRATE (PF) 100 MCG/2ML IJ SOLN
INTRAMUSCULAR | Status: AC
  Filled 2023-07-25: qty 2

## 2023-07-25 MED ORDER — MIDAZOLAM HCL 2 MG/2ML IJ SOLN
INTRAMUSCULAR | Status: AC
  Filled 2023-07-25: qty 2

## 2023-07-25 MED ORDER — HEPARIN (PORCINE) IN NACL 1000-0.9 UT/500ML-% IV SOLN
INTRAVENOUS | Status: DC | PRN
  Administered 2023-07-25 (×2): 500 mL

## 2023-07-25 MED ORDER — HEPARIN SODIUM (PORCINE) 1000 UNIT/ML IJ SOLN
INTRAMUSCULAR | Status: AC
  Filled 2023-07-25: qty 10

## 2023-07-25 MED ORDER — SODIUM CHLORIDE 0.9 % IV SOLN: INTRAVENOUS | Status: AC

## 2023-07-25 MED ORDER — HEPARIN SODIUM (PORCINE) 1000 UNIT/ML IJ SOLN
INTRAMUSCULAR | Status: DC | PRN
  Administered 2023-07-25: 5000 [IU] via INTRAVENOUS

## 2023-07-25 MED ORDER — LIDOCAINE HCL (PF) 1 % IJ SOLN
INTRAMUSCULAR | Status: DC | PRN
  Administered 2023-07-25: 12 mL

## 2023-07-25 MED ORDER — MIDAZOLAM HCL 2 MG/2ML IJ SOLN
INTRAMUSCULAR | Status: DC | PRN
  Administered 2023-07-25: 1 mg via INTRAVENOUS

## 2023-07-25 MED ORDER — ACETAMINOPHEN 325 MG PO TABS
650.0000 mg | ORAL_TABLET | ORAL | Status: DC | PRN
Start: 2023-07-25 — End: 2023-07-26

## 2023-07-25 MED ORDER — LABETALOL HCL 5 MG/ML IV SOLN: 10.0000 mg | INTRAVENOUS | Status: DC | PRN

## 2023-07-25 MED ORDER — CLOPIDOGREL BISULFATE 300 MG PO TABS
ORAL_TABLET | ORAL | Status: AC
  Filled 2023-07-25: qty 1

## 2023-07-25 MED ORDER — LIDOCAINE HCL (PF) 1 % IJ SOLN
INTRAMUSCULAR | Status: AC
  Filled 2023-07-25: qty 30

## 2023-07-25 SURGICAL SUPPLY — 21 items
CATH CXI 2.3F 90 ST (CATHETERS) IMPLANT
CATH OMNI FLUSH 5F 65CM (CATHETERS) IMPLANT
CATH QUICKCROSS SUPP .035X90CM (MICROCATHETER) IMPLANT
CATH SHOCKWAVE E8 4X80 (CATHETERS) IMPLANT
CLOSURE PERCLOSE PROSTYLE (VASCULAR PRODUCTS) IMPLANT
COVER DOME SNAP 22 D (MISCELLANEOUS) IMPLANT
GLIDEWIRE ADV .035X260CM (WIRE) IMPLANT
KIT MICROPUNCTURE NIT STIFF (SHEATH) IMPLANT
KIT PV (KITS) ×2 IMPLANT
KIT SYRINGE INJ CVI SPIKEX1 (MISCELLANEOUS) IMPLANT
SET ATX-X65L (MISCELLANEOUS) IMPLANT
SHEATH INTROD PASSPORT 45 6.5F (SHEATH) IMPLANT
SHEATH PINNACLE 5F 10CM (SHEATH) IMPLANT
SHEATH PINNACLE 6F 10CM (SHEATH) IMPLANT
SHEATH PROBE COVER 6X72 (BAG) IMPLANT
STENT VIABAHNBX 6X29X135 (Permanent Stent) IMPLANT
TRAY PV CATH (CUSTOM PROCEDURE TRAY) ×2 IMPLANT
WIRE BENTSON .035X145CM (WIRE) IMPLANT
WIRE HI TORQ COMMND ES.014X300 (WIRE) IMPLANT
WIRE ROSEN-J .035X260CM (WIRE) IMPLANT
WIRE SPARTACORE .014X300CM (WIRE) IMPLANT

## 2023-07-25 NOTE — Progress Notes (Signed)
  Progress Note    07/25/2023 6:53 AM Hospital Day 4  Subjective:  excited and ready for her procedure.   afebrile  Vitals:   07/24/23 2010 07/25/23 0330  BP: 135/64 128/73  Pulse: 72 71  Resp: 18 18  Temp: 98.1 F (36.7 C) 98.2 F (36.8 C)  SpO2: 97% 98%    Physical Exam: General:  no distress Lungs:  non labored   CBC    Component Value Date/Time   WBC 9.9 07/22/2023 0508   RBC 3.86 (L) 07/22/2023 0508   HGB 11.9 (L) 07/22/2023 0508   HGB 13.7 06/10/2023 0926   HCT 34.4 (L) 07/22/2023 0508   HCT 42.6 06/10/2023 0926   PLT 159 07/22/2023 0508   PLT 171 06/10/2023 0926   MCV 89.1 07/22/2023 0508   MCV 93 06/10/2023 0926   MCH 30.8 07/22/2023 0508   MCHC 34.6 07/22/2023 0508   RDW 14.1 07/22/2023 0508   RDW 13.5 06/10/2023 0926   LYMPHSABS 1.2 07/21/2023 1227   LYMPHSABS 1.7 06/10/2023 0926   MONOABS 0.7 07/21/2023 1227   EOSABS 0.0 07/21/2023 1227   EOSABS 0.2 06/10/2023 0926   BASOSABS 0.0 07/21/2023 1227   BASOSABS 0.0 06/10/2023 0926    BMET    Component Value Date/Time   NA 134 (L) 07/25/2023 0541   NA 136 06/10/2023 0926   K 4.0 07/25/2023 0541   CL 99 07/25/2023 0541   CO2 28 07/25/2023 0541   GLUCOSE 89 07/25/2023 0541   BUN 10 07/25/2023 0541   BUN 21 06/10/2023 0926   CREATININE 0.94 07/25/2023 0541   CALCIUM 8.4 (L) 07/25/2023 0541   GFRNONAA >60 07/25/2023 0541   GFRAA 88 04/23/2019 1021    INR    Component Value Date/Time   INR 1.0 11/05/2021 0114     Intake/Output Summary (Last 24 hours) at 07/25/2023 0102 Last data filed at 07/24/2023 2255 Gross per 24 hour  Intake 239 ml  Output --  Net 239 ml     Assessment/Plan:  67 y.o. female with chronic mesenteric ischemia  Hospital Day 4  -for angiogram today in [facility] lab with Dr. Hetty Blend -continue npo   Doreatha Massed, PA-C [facility] [phone] 07/25/2023 6:53 AM

## 2023-07-25 NOTE — Plan of Care (Signed)

## 2023-07-25 NOTE — Op Note (Signed)
    Patient name: Christie Moreno MRN: [id number] DOB: [birth year] Sex: female  07/25/2023 Pre-operative Diagnosis: Chronic mesenteric ischemia Post-operative diagnosis:  Same Surgeon:  Daria Pastures, MD Procedure Performed:  Ultrasound-guided access of right common femoral artery Aortogram and mesenteric angiogram First order cannulation of SMA Intravascular lithotripsy of SMA, 4 mm shockwave E8 SMA stent placement, 6 x 29 VBX Pro-glide closure of right common femoral artery Moderate sedation of 68 minutes   Indications: Patient is a 67 year old female who presents with chronic mesenteric ischemia and a 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  She has had symptoms of severe postprandial pain with food fear and weight loss over the last 3 to 6 months.  Risks and benefits of mesenteric angiogram with intervention and possible stenting were reviewed, specifically we discussed the risks of dissection, thrombosis or distal embolization leading to acute mesenteric ischemia and possibly death.  She expressed understanding and elected to proceed.  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  Ratty calcific disease throughout the main branch of the SMA resulting in approximate 70% stenosis in the midsegment. Widely patent celiac artery with some mild compression from arcuate ligament causing less than 20% stenosis.   Procedure:  The patient was identified in the holding area and taken to the cath lab  The patient was then placed supine on the table and prepped and draped in the usual sterile fashion.  A time out was called.  Ultrasound was used to evaluate the right common femoral artery.  It was patent .  A digital ultrasound image was acquired.  A micropuncture needle was used to access the right common femoral artery under ultrasound guidance.  An 018 wire was advanced without resistance and a micropuncture sheath was placed.  The 018 wire was removed and a benson  wire was placed.  The micropuncture sheath was exchanged for a 5 french sheath.  An omniflush catheter was advanced over the wire to the level of L-1.  An abdominal angiogram was obtained at a steep LAO which the above findings.  Patient was then systemically heparinized, the wire was replaced and the short 5 [place] sheath was exchanged for a 6.5 [place] tour guide sheath.  Using a Glidewire and quick cross catheter the SMA was cannulated and the quick cross catheter placed in the mid segment.  I then obtained a selective angiogram which demonstrated the right calcific disease throughout the mid segment.  For this reason I elected to predilate with a 4 mm shockwave balloon.  An 014 wire was placed through the quick cross catheter and using a 4 mm x 8 cm E8 shockwave balloon IVUS was performed for 3 rounds throughout the main branch of the SMA.  Angiography demonstrated an adequate response.  The 014 wire was exchanged for a Rosen wire via quick cross catheter and a 6 x 29 VBX was deployed in the proximal segment of the SMA with care to ensure few millimeters will be on the ostium and into the aorta.  Completion angiography demonstrated wide patency of the stent with brisk flow and maintenance of the multiple side branches.  The right common femoral artery was then closed with a Pro-glide closure device.  Contrast: 85 mL Sedation: 68 minutes  Impression: Excellent placement of SMA stent with good flow through the mid segment and maintenance of the sidebranches.   Daria Pastures MD [facility] [place] Office: [phone]

## 2023-07-25 NOTE — Progress Notes (Signed)
PROGRESS NOTE    Christie Moreno  BJY:[id number] DOB: 04-17-1956 DOA: 07/21/2023 PCP: Melida Quitter, PA  Subjective: Getting angiogram and stent  Hospital Course: HPI: Christie Moreno is a 67 y.o. female with medical history significant of hyperlipidemia, hypothyroidism, CAD, diastolic CHF, malnutrition, seizure disorder, anxiety, COPD presenting with abdominal pain and abnormal CT.   Patient reports about a month of abdominal pain.  She was seen by GI 2 days ago due to this worsening epigastric/periumbilical abdominal pain.  Had some prior issues with constipation in the past but had not had a CT scan for some time.  Had some alternating diarrhea and constipation.   GI sent patient for a CT abdomen pelvis for further evaluation.  This came back yesterday showing loops of bowel  without wall thickening representing possible ileus but no obstruction.  Also noted severe atherosclerosis of the abdomen with severe stenosis of the SMA versus possible occlusion.  Additionally noted some thyroids and recommended an ultrasound for suspected exophytic posterior fibroid.   Given these results, her GI provider, Dr. Leonides Schanz, discussed findings with vascular surgery who recommended close vascular surgery follow-up versus ED evaluation.  Due to progressive abdominal pain it was decided the patient should present to the ED for further evaluation which she has done today.   Denies fevers, chills, chest pain, shortness of breath, nausea, vomiting.   ED Course: Vital signs in the ED stable.  Lab workup significant for CMP with sodium 131, chloride 92, protein 6.0, albumin 3.2.  CBC with leukocytosis to 12.5.  Lactic acid normal with repeat pending.  Lipase normal.  Recent CT findings as above.  Vascular surgery consulted in the ED and on-call vascular surgeon discussed case with vascular surgeon who reviewed initial imaging.  Plan is for angiogram and possible endovascular intervention on 12/23.  For  now recommending aspirin, statin.  Significant Events: Admitted 07/21/2023 for mesenteric ischemia   Significant Labs: sodium 131, chloride 92, protein 6.0, albumin 3.2. CBC with leukocytosis to 12.5. Lactic acid normal   Significant Imaging Studies: Ct abd/pelvis shows Few prominent loops of small bowel in the pelvis have no associated wall thickening. There is no obstruction with enteric contrast reaching distal to this. Findings may represent focal ileus. 2. Advanced abdominal atherosclerosis. High-grade stenosis versus subtotal occlusion at the origin of the SMA which is likely segmentally occluded proximally. There is some distal reconstitution, however the vessels are heavily calcified. 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 Eccentric as well as circumferential mural thrombus in the abdominal aorta. 3. Wall thickening of the distal esophagus with small hiatal hernia. 4. Heterogeneous uterus suggests fibroids. Suspected exophytic fibroid posteriorly measuring 3.3 cm that is enhancing. Recommend nonemergent pelvic ultrasound for further evaluation   Consultants: Vascular surgery   Assessment and Plan: * Mesenteric ischemia (HCC) - angiogram by vascular surgery on Monday, Jul 25, 2023.-- stent placed  Hyponatremia -stable  FTT (failure to thrive) in adult -BMI 13.79-- ? May improve now that stent in  Pressure injury of skin -present on admission. stage 1 to sacrum, blanchable redness to bilateral elbows and heels   Coronary artery disease involving native coronary artery of native heart without angina pectoris -stable  Hypothyroidism -stable. Remains on synthroid 25 mcg daily.  Hyperlipidemia -stable  Protein-calorie malnutrition, severe 07-22-2023 see RD note dated 07-22-2023. "Severe Malnutrition related to chronic illness as evidenced by meal completion < 25%, severe fat depletion, severe  muscle depletion, estimated needs. "  Tobacco dependence -pt advised to stop smoking.  COPD with chronic bronchitis (HCC) -stable  Anxiety -4 stable  Partial symptomatic epilepsy with complex partial seizures, not intractable, without status epilepticus (HCC) - continue with lamictal 100 mg bid.  Hypokalemia -replete   DVT prophylaxis: Place and maintain sequential compression device Start: 07/22/23 1333    Code Status: Full Code Family Communication: no family at bedside Disposition Plan: return home Reason for continuing need for hospitalization: per vascular  Objective: Vitals:   07/25/23 1200 07/25/23 1215 07/25/23 1230 07/25/23 1248  BP: (!) 121/48 (!) 107/50  (!) 119/54  Pulse:  64  65  Resp: 15 15 17 13   Temp:    97.9 F (36.6 C)  TempSrc:    Oral  SpO2:  99%  100%  Weight:      Height:        Intake/Output Summary (Last 24 hours) at 07/25/2023 1325 Last data filed at 07/24/2023 2255 Gross per 24 hour  Intake 239 ml  Output --  Net 239 ml   Filed Weights   07/23/23 0500 07/24/23 0500 07/25/23 0330  Weight: 34.9 kg 34.6 kg 36.4 kg    Examination:  Getting angiogram  Data Reviewed: I have personally reviewed following labs and imaging studies  CBC: Recent Labs  Lab 07/21/23 1227 07/22/23 0508  WBC 12.5* 9.9  NEUTROABS 10.5*  --   HGB 14.3 11.9*  HCT 42.7 34.4*  MCV 89.7 89.1  PLT 177 159   Basic Metabolic Panel: Recent Labs  Lab 07/21/23 1227 07/22/23 0508 07/24/23 0836 07/25/23 0541  NA 131* 130* 134* 134*  K 3.8 3.4* 4.0 4.0  CL 92* 97* 99 99  CO2 26 25 28 28   GLUCOSE 86 86 85 89  BUN 16 10 8 10   CREATININE 0.91 1.04* 1.07* 0.94  CALCIUM 9.4 8.4* 8.6* 8.4*  PHOS  --   --   --  2.7   GFR: Estimated Creatinine Clearance: 32.5 mL/min (by C-G formula based on SCr of 0.94 mg/dL). Liver Function Tests: Recent Labs  Lab 07/21/23 1227 07/22/23 0508 07/24/23 0836 07/25/23 0541  AST 33 26 26  --   ALT 25 18 19   --   ALKPHOS  56 47 46  --   BILITOT 0.8 0.6 0.5  --   PROT 6.0* 4.8* 5.1*  --   ALBUMIN 3.2* 2.5* 2.6* 2.4*   Recent Labs  Lab 07/21/23 1227  LIPASE 26   BNP (last 3 results) Recent Labs    07/10/23 0001  BNP 667.0*   Sepsis Labs: Recent Labs  Lab 07/21/23 1256 07/22/23 0508  LATICACIDVEN 0.9 0.8   Scheduled Meds:  aspirin EC  81 mg Oral Daily   atorvastatin  80 mg Oral QPM   carvedilol  3.125 mg Oral BID WC   feeding supplement  237 mL Oral BID BM   [START ON 07/26/2023] furosemide  20 mg Oral Daily   lamoTRIgine  100 mg Oral BID   levothyroxine  25 mcg Oral Q0600   [START ON 07/26/2023] losartan  25 mg Oral Daily   multivitamin with minerals  1 tablet Oral Daily   pneumococcal 20-valent conjugate vaccine  0.5 mL Intramuscular Tomorrow-1000   sodium chloride flush  3 mL Intravenous Q12H   Continuous Infusions:  sodium chloride 40 mL/hr at 07/25/23 0948   sodium chloride 1 mL/kg/hr (07/25/23 1306)     LOS: 4 days   Time spent: 40 minutes  Joseph Art, DO  [facility]  07/25/2023, 1:25 PM

## 2023-07-26 ENCOUNTER — Other Ambulatory Visit (HOSPITAL_COMMUNITY): Payer: Self-pay

## 2023-07-26 ENCOUNTER — Encounter (HOSPITAL_COMMUNITY): Payer: Self-pay | Admitting: Vascular Surgery

## 2023-07-26 DIAGNOSIS — K559 Vascular disorder of intestine, unspecified: Secondary | ICD-10-CM | POA: Diagnosis not present

## 2023-07-26 LAB — CBC
HCT: 34.3 % — ABNORMAL LOW (ref 36.0–46.0)
Hemoglobin: 11.4 g/dL — ABNORMAL LOW (ref 12.0–15.0)
MCH: 30.5 pg (ref 26.0–34.0)
MCHC: 33.2 g/dL (ref 30.0–36.0)
MCV: 91.7 fL (ref 80.0–100.0)
Platelets: 174 10*3/uL (ref 150–400)
RBC: 3.74 MIL/uL — ABNORMAL LOW (ref 3.87–5.11)
RDW: 14.5 % (ref 11.5–15.5)
WBC: 7.8 10*3/uL (ref 4.0–10.5)
nRBC: 0 % (ref 0.0–0.2)

## 2023-07-26 LAB — RENAL FUNCTION PANEL
Albumin: 2.4 g/dL — ABNORMAL LOW (ref 3.5–5.0)
Anion gap: 8 (ref 5–15)
BUN: 10 mg/dL (ref 8–23)
CO2: 27 mmol/L (ref 22–32)
Calcium: 8.2 mg/dL — ABNORMAL LOW (ref 8.9–10.3)
Chloride: 98 mmol/L (ref 98–111)
Creatinine, Ser: 0.75 mg/dL (ref 0.44–1.00)
GFR, Estimated: >60 mL/min (ref >60)
Glucose, Bld: 111 mg/dL — ABNORMAL HIGH (ref 70–99)
Phosphorus: 3 mg/dL (ref 2.5–4.6)
Potassium: 3.6 mmol/L (ref 3.5–5.1)
Sodium: 133 mmol/L — ABNORMAL LOW (ref 135–145)

## 2023-07-26 MED ORDER — CLOPIDOGREL BISULFATE 75 MG PO TABS
75.0000 mg | ORAL_TABLET | Freq: Every day | ORAL | 0 refills | Status: DC
  Filled 2023-07-26: qty 90, 90d supply, fill #0

## 2023-07-26 MED ORDER — CLOPIDOGREL BISULFATE 75 MG PO TABS
75.0000 mg | ORAL_TABLET | Freq: Every day | ORAL | Status: DC
  Administered 2023-07-26: 75 mg via ORAL
  Filled 2023-07-26: qty 1

## 2023-07-26 MED ORDER — ACETAMINOPHEN 325 MG PO TABS: 650.0000 mg | ORAL_TABLET | ORAL | Status: DC | PRN

## 2023-07-26 NOTE — Progress Notes (Addendum)
  Progress Note    07/26/2023 7:46 AM 1 Day Post-Op  Subjective:  says some stomach discomfort this morning and into her back. Says she " nibbled" yesterday, but still not really wanting to eat   Vitals:   07/25/23 2336 07/26/23 0335  BP: (!) 113/56 (!) 136/54  Pulse: 63 66  Resp: 20 18  Temp: 98.2 F (36.8 C) 98.1 F (36.7 C)  SpO2: 98% 98%   Physical Exam: Cardiac:  regular Lungs:  non labored Extremities: Right CF access without swelling or hematoma Abdomen:  soft, mild lower mid abdominal tenderness to palpation Neurologic: alert and oriented   CBC    Component Value Date/Time   WBC 7.8 07/26/2023 0308   RBC 3.74 (L) 07/26/2023 0308   HGB 11.4 (L) 07/26/2023 0308   HGB 13.7 06/10/2023 0926   HCT 34.3 (L) 07/26/2023 0308   HCT 42.6 06/10/2023 0926   PLT 174 07/26/2023 0308   PLT 171 06/10/2023 0926   MCV 91.7 07/26/2023 0308   MCV 93 06/10/2023 0926   MCH 30.5 07/26/2023 0308   MCHC 33.2 07/26/2023 0308   RDW 14.5 07/26/2023 0308   RDW 13.5 06/10/2023 0926   LYMPHSABS 1.2 07/21/2023 1227   LYMPHSABS 1.7 06/10/2023 0926   MONOABS 0.7 07/21/2023 1227   EOSABS 0.0 07/21/2023 1227   EOSABS 0.2 06/10/2023 0926   BASOSABS 0.0 07/21/2023 1227   BASOSABS 0.0 06/10/2023 0926    BMET    Component Value Date/Time   NA 133 (L) 07/26/2023 0308   NA 136 06/10/2023 0926   K 3.6 07/26/2023 0308   CL 98 07/26/2023 0308   CO2 27 07/26/2023 0308   GLUCOSE 111 (H) 07/26/2023 0308   BUN 10 07/26/2023 0308   BUN 21 06/10/2023 0926   CREATININE 0.75 07/26/2023 0308   CALCIUM 8.2 (L) 07/26/2023 0308   GFRNONAA >60 07/26/2023 0308   GFRAA 88 04/23/2019 1021    INR    Component Value Date/Time   INR 1.0 11/05/2021 0114     Intake/Output Summary (Last 24 hours) at 07/26/2023 0746 Last data filed at 07/25/2023 1548 Gross per 24 hour  Intake 338.28 ml  Output --  Net 338.28 ml     Assessment/Plan:  67 y.o. female is s/p Aortogram, Mesenteric Angiogram, SMA  shockwave, SMA stent 1 Day Post-Op   Mild abdominal discomfort this morning Abdomen soft Right CF access site without swelling or hematoma Increase diet slowly as tolerated Discussed importance of her smoking cessation  Aspirin, Statin, Plavix Okay for discharge from vascular standpoint She will follow up in our office in 1 month with mesenteric duplex  Graceann Congress, PA-C [facility] [phone] 07/26/2023 7:46 AM  VASCULAR STAFF ADDENDUM: I have independently interviewed and examined the patient. I agree with the above.  Eating breakfast. No abdominal pain with eating. Okay for d/c form vascular surgery standpoint ASA/plavix/ high intensity statin   Victorino Sparrow MD [facility] Phone Number: [phone] 07/26/2023 9:07 AM

## 2023-07-26 NOTE — Discharge Instructions (Signed)
  [facility] [place]  Discharge Instructions  Lower Extremity Angiogram; Angioplasty/Stenting  Please refer to the following instructions for your post-procedure care. Your surgeon or physician assistant will discuss any changes with you.  Activity  Avoid lifting more than 8 pounds (1 gallons of milk) for 5 days after your procedure. You may walk as much as you can tolerate. It's OK to drive after 72 hours.  Bathing/Showering  You may shower the day after your procedure. If you have a bandage, you may remove it at 24- 48 hours. Clean your incision site with mild soap and water. Pat the area dry with a clean towel.  Diet  Resume your pre-procedure diet. There are no special food restrictions following this procedure. All patients with peripheral vascular disease should follow a low fat/low cholesterol diet. In order to heal from your surgery, it is CRITICAL to get adequate nutrition. Your body requires vitamins, minerals, and protein. Vegetables are the best source of vitamins and minerals. Vegetables also provide the perfect balance of protein. Processed food has little nutritional value, so try to avoid this.  Medications  Resume taking all of your medications unless your doctor tells you not to. If your incision is causing pain, you may take over-the-counter pain relievers such as acetaminophen (Tylenol)  Follow Up  Follow up will be arranged at the time of your procedure. You may have an office visit scheduled or may be scheduled for surgery. Ask your surgeon if you have any questions.  Please call us immediately for any of the following conditions: .Severe or worsening pain your legs or feet at rest or with walking. .Increased pain, redness, drainage at your groin puncture site. .Fever of 101 degrees or higher. .If you have any mild or slow bleeding from your puncture site: lie down, apply firm constant pressure over the area with a piece of gauze or a  clean wash cloth for 30 minutes- no peeking!, call 911 right away if you are still bleeding after 30 minutes, or if the bleeding is heavy and unmanageable.  Reduce your risk factors of vascular disease:  . Stop smoking. If you would like help call [facility] at 1-800-QUIT-NOW (1-800-784-8669) or [place] at 336-586-4000. . Manage your cholesterol . Maintain a desired weight . Control your diabetes . Keep your blood pressure down .  If you have any questions, please call the office at [phone] 

## 2023-07-26 NOTE — Discharge Summary (Signed)
Physician Discharge Summary  Christie Moreno ZOX:[id number] DOB: 1955-09-06 DOA: 07/21/2023  PCP: Melida Quitter, PA  Admit date: 07/21/2023 Discharge date: 07/26/2023  Admitted From: home Discharge disposition: home   Recommendations for Outpatient Follow-Up:   Smoking cessation ASA/plavix/statin Close vascular follow up Cbc/BMP 1 week   Discharge Diagnosis:   Principal Problem:   Mesenteric ischemia (HCC) Active Problems:   FTT (failure to thrive) in adult   Hyponatremia   Partial symptomatic epilepsy with complex partial seizures, not intractable, without status epilepticus (HCC)   Anxiety   COPD with chronic bronchitis (HCC)   Tobacco dependence   Protein-calorie malnutrition, severe   Hyperlipidemia   Hypothyroidism   Coronary artery disease involving native coronary artery of native heart without angina pectoris   Pressure injury of skin   Hypokalemia    Discharge Condition: Improved.  Diet recommendation: Regular.  Wound care: None.  Code status: Full.   History of Present Illness:    Christie Moreno is a 67 y.o. female with medical history significant of hyperlipidemia, hypothyroidism, CAD, diastolic CHF, malnutrition, seizure disorder, anxiety, COPD presenting with abdominal pain and abnormal CT.   Patient reports about a month of abdominal pain.  She was seen by GI 2 days ago due to this worsening epigastric/periumbilical abdominal pain.  Had some prior issues with constipation in the past but had not had a CT scan for some time.  Had some alternating diarrhea and constipation.   GI sent patient for a CT abdomen pelvis for further evaluation.  This came back yesterday showing loops of bowel  without wall thickening representing possible ileus but no obstruction.  Also noted severe atherosclerosis of the abdomen with severe stenosis of the SMA versus possible occlusion.  Additionally noted some thyroids and recommended an ultrasound for  suspected exophytic posterior fibroid.   Given these results, her GI provider, Dr. Leonides Schanz, discussed findings with vascular surgery who recommended close vascular surgery follow-up versus ED evaluation.  Due to progressive abdominal pain it was decided the patient should present to the ED for further evaluation which she has done today.   Denies fevers, chills, chest pain, shortness of breath, nausea, vomiting.   ED Course: Vital signs in the ED stable.  Lab workup significant for CMP with sodium 131, chloride 92, protein 6.0, albumin 3.2.  CBC with leukocytosis to 12.5.  Lactic acid normal with repeat pending.  Lipase normal.  Recent CT findings as above.  Vascular surgery consulted in the ED and on-call vascular surgeon discussed case with vascular surgeon who reviewed initial imaging.  Plan is for angiogram and possible endovascular intervention on 12/23.  For now recommending aspirin, statin.     Hospital Course by Problem:   Mesenteric ischemia [facility]) - angiogram by vascular surgery on Monday, Jul 25, 2023.-- stent placed   Hyponatremia -stable  -outpatient follow up  FTT (failure to thrive) in adult -BMI 13.79-- ? May improve now that stent in   Pressure injury of skin -present on admission. stage 1 to sacrum, blanchable redness to bilateral elbows and heels    Coronary artery disease involving native coronary artery of native heart without angina pectoris -stable   Hypothyroidism -stable. Remains on synthroid 25 mcg daily.   Hyperlipidemia -stable   Protein-calorie malnutrition, severe 07-22-2023 see RD note dated 07-22-2023. "Severe Malnutrition related to chronic illness as evidenced by meal completion < 25%, severe fat depletion, severe muscle depletion, estimated needs. "   Tobacco dependence -pt  advised to stop smoking.   COPD with chronic bronchitis (HCC) -stable   Anxiety -stable   Partial symptomatic epilepsy with complex partial seizures, not intractable,  without status epilepticus (HCC) - continue with lamictal 100 mg bid.   Hypokalemia -replete      Medical Consultants:   vascular   Discharge Exam:   Vitals:   07/26/23 0335 07/26/23 0911  BP: (!) 136/54 (!) 115/59  Pulse: 66 75  Resp: 18   Temp: 98.1 F (36.7 C) 98 F (36.7 C)  SpO2: 98% 98%   Vitals:   07/25/23 2336 07/26/23 0335 07/26/23 0500 07/26/23 0911  BP: (!) 113/56 (!) 136/54  (!) 115/59  Pulse: 63 66  75  Resp: 20 18    Temp: 98.2 F (36.8 C) 98.1 F (36.7 C)  98 F (36.7 C)  TempSrc: Oral Oral  Oral  SpO2: 98% 98%  98%  Weight:   34.8 kg   Height:        General exam: Appears calm and comfortable.   The results of significant diagnostics from this hospitalization (including imaging, microbiology, ancillary and laboratory) are listed below for reference.     Procedures and Diagnostic Studies:   No results found.   Labs:   Basic Metabolic Panel: Recent Labs  Lab 07/21/23 1227 07/22/23 0508 07/24/23 0836 07/25/23 0541 07/26/23 0308  NA 131* 130* 134* 134* 133*  K 3.8 3.4* 4.0 4.0 3.6  CL 92* 97* 99 99 98  CO2 26 25 28 28 27   GLUCOSE 86 86 85 89 111*  BUN 16 10 8 10 10   CREATININE 0.91 1.04* 1.07* 0.94 0.75  CALCIUM 9.4 8.4* 8.6* 8.4* 8.2*  PHOS  --   --   --  2.7 3.0   GFR Estimated Creatinine Clearance: 36.5 mL/min (by C-G formula based on SCr of 0.75 mg/dL). Liver Function Tests: Recent Labs  Lab 07/21/23 1227 07/22/23 0508 07/24/23 0836 07/25/23 0541 07/26/23 0308  AST 33 26 26  --   --   ALT 25 18 19   --   --   ALKPHOS 56 47 46  --   --   BILITOT 0.8 0.6 0.5  --   --   PROT 6.0* 4.8* 5.1*  --   --   ALBUMIN 3.2* 2.5* 2.6* 2.4* 2.4*   Recent Labs  Lab 07/21/23 1227  LIPASE 26   No results for input(s): "AMMONIA" in the last 168 hours. Coagulation profile No results for input(s): "INR", "PROTIME" in the last 168 hours.  CBC: Recent Labs  Lab 07/21/23 1227 07/22/23 0508 07/26/23 0308  WBC 12.5* 9.9 7.8   NEUTROABS 10.5*  --   --   HGB 14.3 11.9* 11.4*  HCT 42.7 34.4* 34.3*  MCV 89.7 89.1 91.7  PLT 177 159 174   Cardiac Enzymes: No results for input(s): "CKTOTAL", "CKMB", "CKMBINDEX", "TROPONINI" in the last 168 hours. BNP: Invalid input(s): "POCBNP" CBG: No results for input(s): "GLUCAP" in the last 168 hours. D-Dimer No results for input(s): "DDIMER" in the last 72 hours. Hgb A1c No results for input(s): "HGBA1C" in the last 72 hours. Lipid Profile No results for input(s): "CHOL", "HDL", "LDLCALC", "TRIG", "CHOLHDL", "LDLDIRECT" in the last 72 hours. Thyroid function studies No results for input(s): "TSH", "T4TOTAL", "T3FREE", "THYROIDAB" in the last 72 hours.  Invalid input(s): "FREET3" Anemia work up No results for input(s): "VITAMINB12", "FOLATE", "FERRITIN", "TIBC", "IRON", "RETICCTPCT" in the last 72 hours. Microbiology No results found for this or any previous  visit (from the past 240 hours).   Discharge Instructions:   Discharge Instructions     Diet general   Complete by: As directed    Discharge instructions   Complete by: As directed    Stop smoking   Increase activity slowly   Complete by: As directed    No wound care   Complete by: As directed       Allergies as of 07/26/2023       Reactions   Azithromycin Nausea And Vomiting   Erythromycin    Sensitivity (GI)   Penicillins Itching, Rash   Percocet [oxycodone-acetaminophen] Rash        Medication List     TAKE these medications    acetaminophen 325 MG tablet Commonly known as: TYLENOL Take 2 tablets (650 mg total) by mouth every 4 (four) hours as needed for headache or mild pain (pain score 1-3).   albuterol (2.5 MG/3ML) 0.083% nebulizer solution Commonly known as: PROVENTIL Take 3 mLs (2.5 mg total) by nebulization every 6 (six) hours as needed for wheezing or shortness of breath.   Aspirin Low Dose 81 MG tablet Generic drug: aspirin EC Take 1 tablet (81 mg total) by mouth daily.  Swallow whole.   atorvastatin 80 MG tablet Commonly known as: LIPITOR TAKE 1 TABLET DAILY AT 6 P.M. (KEEP UPCOMING APPOINTMENT FOR FUTURE REFILLS   carvedilol 3.125 MG tablet Commonly known as: COREG TAKE 1 TABLET TWICE A DAY WITH MEALS   clopidogrel 75 MG tablet Commonly known as: PLAVIX Take 1 tablet (75 mg total) by mouth daily. Start taking on: July 27, 2023   estrogens (conjugated) 1.25 MG tablet Commonly known as: PREMARIN Take 1.25 mg by mouth daily.   feeding supplement Liqd Take 237 mLs by mouth 2 (two) times daily between meals.   furosemide 40 MG tablet Commonly known as: LASIX TAKE ONE-HALF (1/2) TABLET (20 MG TOTAL) DAILY (KEEP UPCOMING APPOINTMENT FOR FUTURE REFILLS) What changed: See the new instructions.   Jardiance 10 MG Tabs tablet Generic drug: empagliflozin TAKE 1 TABLET DAILY   lamoTRIgine 100 MG tablet Commonly known as: LAMICTAL Take 1 tablet (100 mg total) by mouth 2 (two) times daily.   levothyroxine 25 MCG tablet Commonly known as: SYNTHROID Take 1 tablet (25 mcg total) by mouth daily before breakfast.   losartan 50 MG tablet Commonly known as: COZAAR TAKE ONE-HALF (1/2) TABLET DAILY   potassium chloride SA 20 MEQ tablet Commonly known as: KLOR-CON M TAKE 1 TABLET DAILY (KEEP UPCOMING APPOINTMENT FOR FUTURE REFILLS) What changed: See the new instructions.   progesterone 200 MG capsule Commonly known as: PROMETRIUM Take 200 mg by mouth at bedtime.        Follow-up Information     [facility] Follow up in 1 month(s).   Why: The office will call you with your appointment Contact information: [address] [place] [address] [phone]        Saralyn Pilar A, PA Follow up in 1 week(s).   Specialty: Family Medicine Contact information: [address] [place] [address] [phone]                  Time coordinating discharge: 45 min  Signed:  Joseph Art DO  [facility] 07/26/2023, 9:50 AM

## 2023-07-26 NOTE — Care Management Important Message (Signed)
Important Message  Patient Details  Name: Christie Moreno MRN: [id number] Date of Birth: [birth year]   Important Message Given:  Yes - Medicare IM     Renie Ora 07/26/2023, 10:29 AM

## 2023-07-26 NOTE — Progress Notes (Signed)
Discharge instructions reviewed with pt.  Copy of instructions given to pt. [facility] filled script for pt and will be picked up on the way to the discharge lounge. Pt has called for her ride, her granddaughter Rolly Salter, discharge lounge phone number given to her so she can call the lounge when she is on the way/here for directions to the entrance. Pt to be d/c'd via wheelchair with belongings,  will be escorted by lounge transport team.  Sherilyn Dacosta SWOT

## 2023-07-26 NOTE — Plan of Care (Signed)
  Problem: Education: Goal: Knowledge of General Education information will improve Description: Including pain rating scale, medication(s)/side effects and non-pharmacologic comfort measures Outcome: Progressing   Problem: Health Behavior/Discharge Planning: Goal: Ability to manage health-related needs will improve Outcome: Progressing   Problem: Clinical Measurements: Goal: Ability to maintain clinical measurements within normal limits will improve Outcome: Progressing Goal: Will remain free from infection Outcome: Progressing Goal: Diagnostic test results will improve Outcome: Progressing Goal: Cardiovascular complication will be avoided Outcome: Progressing   Problem: Elimination: Goal: Will not experience complications related to bowel motility Outcome: Progressing Goal: Will not experience complications related to urinary retention Outcome: Progressing   Problem: Pain Management: Goal: General experience of comfort will improve Outcome: Progressing

## 2023-07-29 ENCOUNTER — Telehealth: Payer: Self-pay

## 2023-07-29 ENCOUNTER — Encounter: Payer: Medicare Other | Admitting: Internal Medicine

## 2023-07-29 NOTE — Telephone Encounter (Signed)
Pt called with c/o abdominal pain and back pain similar to what she experienced in hospital when she was d/c. She is trying to just eat small amounts at a time. Stated she went to lunch yesterday and "choked" but is doing better. Hands and feet are a little numb and tingling. She has not been drinking as much liquids. Advised her to make sure she stays well hydrated. She is having bowel movements. She noticed a small sized marble area around incision that does not hurt and feels incision is without issues. I have advised her to monitor that area and if it starts to hurt or grows any larger to call us back and let us know. She is aware we have a provider available over the weekend, should anything change/worsen and she is concerned. Pt verbalized understanding.

## 2023-07-29 NOTE — Telephone Encounter (Signed)
MD made aware of pt's call/concerns. In agreement with plan.

## 2023-07-31 ENCOUNTER — Emergency Department (HOSPITAL_COMMUNITY): Payer: Medicare Other

## 2023-07-31 ENCOUNTER — Other Ambulatory Visit: Payer: Self-pay

## 2023-07-31 ENCOUNTER — Encounter (HOSPITAL_COMMUNITY): Payer: Self-pay | Admitting: Emergency Medicine

## 2023-07-31 ENCOUNTER — Emergency Department (HOSPITAL_COMMUNITY)
Admission: EM | Admit: 2023-07-31 | Discharge: 2023-08-01 | Disposition: A | Discharge status: Home / Self Care | Payer: Medicare Other

## 2023-07-31 DIAGNOSIS — R600 Localized edema: Secondary | ICD-10-CM | POA: Diagnosis present

## 2023-07-31 DIAGNOSIS — Z7902 Long term (current) use of antithrombotics/antiplatelets: Secondary | ICD-10-CM | POA: Diagnosis not present

## 2023-07-31 DIAGNOSIS — Z7982 Long term (current) use of aspirin: Secondary | ICD-10-CM | POA: Insufficient documentation

## 2023-07-31 LAB — CBC WITH DIFFERENTIAL/PLATELET
Abs Immature Granulocytes: 0.04 10*3/uL (ref 0.00–0.07)
Basophils Absolute: 0.1 10*3/uL (ref 0.0–0.1)
Basophils Relative: 1 %
Eosinophils Absolute: 0.2 10*3/uL (ref 0.0–0.5)
Eosinophils Relative: 2 %
HCT: 34.2 % — ABNORMAL LOW (ref 36.0–46.0)
Hemoglobin: 11.2 g/dL — ABNORMAL LOW (ref 12.0–15.0)
Immature Granulocytes: 0 %
Lymphocytes Relative: 19 %
Lymphs Abs: 1.7 10*3/uL (ref 0.7–4.0)
MCH: 30.8 pg (ref 26.0–34.0)
MCHC: 32.7 g/dL (ref 30.0–36.0)
MCV: 94 fL (ref 80.0–100.0)
Monocytes Absolute: 0.7 10*3/uL (ref 0.1–1.0)
Monocytes Relative: 7 %
Neutro Abs: 6.2 10*3/uL (ref 1.7–7.7)
Neutrophils Relative %: 71 %
Platelets: 293 10*3/uL (ref 150–400)
RBC: 3.64 MIL/uL — ABNORMAL LOW (ref 3.87–5.11)
RDW: 15.2 % (ref 11.5–15.5)
WBC: 8.9 10*3/uL (ref 4.0–10.5)
nRBC: 0 % (ref 0.0–0.2)

## 2023-07-31 LAB — BRAIN NATRIURETIC PEPTIDE: B Natriuretic Peptide: 375.9 pg/mL — ABNORMAL HIGH (ref 0.0–100.0)

## 2023-07-31 LAB — COMPREHENSIVE METABOLIC PANEL
ALT: 37 U/L (ref 0–44)
AST: 38 U/L (ref 15–41)
Albumin: 3 g/dL — ABNORMAL LOW (ref 3.5–5.0)
Alkaline Phosphatase: 55 U/L (ref 38–126)
Anion gap: 9 (ref 5–15)
BUN: 19 mg/dL (ref 8–23)
CO2: 24 mmol/L (ref 22–32)
Calcium: 8.9 mg/dL (ref 8.9–10.3)
Chloride: 102 mmol/L (ref 98–111)
Creatinine, Ser: 0.93 mg/dL (ref 0.44–1.00)
GFR, Estimated: >60 mL/min (ref >60)
Glucose, Bld: 76 mg/dL (ref 70–99)
Potassium: 4 mmol/L (ref 3.5–5.1)
Sodium: 135 mmol/L (ref 135–145)
Total Bilirubin: <0.2 mg/dL (ref <1.2)
Total Protein: 6.1 g/dL — ABNORMAL LOW (ref 6.5–8.1)

## 2023-07-31 NOTE — ED Provider Triage Note (Cosign Needed Addendum)
Emergency Medicine Provider Triage Evaluation Note  Christie Moreno , a 67 y.o. female  was evaluated in triage.  Pt complains of post procedure concerns. Had angiogram and stent placed on 12/23 by Dr. Hetty Blend and is not having pain and a lump at the site of the angiogram/stent. Also c/o LLQ and RLQ pain. Endorses chills but no fevers, urinary sx, N/V/D. Hx of mesenteric ischemia.  Reports the area has been enlarging for the last couple days. Review of Systems  Positive: Abdominal pain Negative: Urinary  Physical Exam  BP (!) 123/52 (BP Location: Right Arm)   Pulse 66   Temp (!) 97.4 F (36.3 C)   Resp 18   SpO2 96%  Gen:   Awake, no distress   Resp:  Normal effort  MSK:   Moves extremities without difficulty  Other:  Bulging mass of RLQ/groin w/ecchymoses  Medical Decision Making  Medically screening exam initiated at 5:19 PM.  Appropriate orders placed.  Christie Moreno was informed that the remainder of the evaluation will be completed by another provider, this initial triage assessment does not replace that evaluation, and the importance of remaining in the ED until their evaluation is complete.   Christie Moreno, [place] 07/31/23 1719   Spoke with Dr. Butch Penny, vascular surgery, he states hold off on CT angio. He will see her tomorrow. Recommends further work up for heart failure.    Christie Moreno, [place] 07/31/23 [id number]

## 2023-07-31 NOTE — Consult Note (Signed)
[facility] [place]  ASSESSMENT / PLAN: 67 y.o. female with small fluid collection under the right groin typical of a small hematoma.  I do not feel a palpable pulse concerning for pseudoaneurysm.  This can likely be managed expectantly.  If the patient is admitted, we will check a duplex tomorrow.  If she is sent home, we will arrange for an outpatient duplex later this week.  CHIEF COMPLAINT: Leg swelling, knot in the right groin  HISTORY OF PRESENT ILLNESS: Christie Moreno is a 67 y.o. female well-known to our service having undergone mesenteric intervention with my partner, Dr. Hetty Blend on 07/25/2023 for chronic mesenteric ischemia.  The patient called our office on Friday reporting some swelling in her right groin which was worrisome to her.  She was counseled to monitor this and if it grew any larger to call for advice.  She called earlier today reporting the swelling to be worse in her groin.  I encouraged her to go to the ER.  On my evaluation in the ER, the patient has about a marble sized knot in her groin.  This does not have any pulsatility to it.  The patient also had significant swelling in both of her legs.  She has easy fatigability.  She reports the symptoms are typical of prior heart failure exacerbations which she has suffered.  Past Medical History:  Diagnosis Date   Complex partial seizure (HCC) 04/22/2015   Convulsions (HCC) 04/19/2013   Heart failure (HCC)    Seizures (HCC)    none in years, recently had medication change    Past Surgical History:  Procedure Laterality Date   COLONOSCOPY     PERIPHERAL INTRAVASCULAR LITHOTRIPSY  07/25/2023   Procedure: PERIPHERAL INTRAVASCULAR LITHOTRIPSY;  Surgeon: Daria Pastures, MD;  Location: [facility] INVASIVE CV LAB;  Service: Cardiovascular;;   PERIPHERAL VASCULAR INTERVENTION  07/25/2023   Procedure: PERIPHERAL VASCULAR INTERVENTION;  Surgeon: Daria Pastures, MD;  Location: [facility] INVASIVE CV LAB;  Service:  Cardiovascular;;   RIGHT/LEFT HEART CATH AND CORONARY ANGIOGRAPHY N/A 11/05/2021   Procedure: RIGHT/LEFT HEART CATH AND CORONARY ANGIOGRAPHY;  Surgeon: Lyn Records, MD;  Location: [facility] INVASIVE CV LAB;  Service: Cardiovascular;  Laterality: N/A;   VISCERAL ANGIOGRAPHY N/A 07/25/2023   Procedure: VISCERAL ANGIOGRAPHY;  Surgeon: Daria Pastures, MD;  Location: [facility] INVASIVE CV LAB;  Service: Cardiovascular;  Laterality: N/A;    Family History  Adopted: Yes  Problem Relation Age of Onset   Breast cancer Mother    Diabetes Mother    Cancer Mother        breast   Stroke Mother    Heart Problems Father    Diabetes Sister    Kidney failure Sister    Breast cancer Maternal Aunt    Lung cancer Half-Sister    Colon cancer Neg Hx    Esophageal cancer Neg Hx    Epilepsy Neg Hx     Social History   Socioeconomic History   Marital status: Significant Other    Spouse name: Rexanne Mano   Number of children: 1   Years of education: Not on file   Highest education level: Bachelor's degree (e.g., BA, AB, BS)  Occupational History   Occupation: retired  Tobacco Use   Smoking status: Some Days    Current packs/day: 0.00    Average packs/day: 1 pack/day for 52.0 years (52.0 ttl pk-yrs)    Types: Cigarettes    Start date: 11/09/1969    Last  attempt to quit: 11/09/2021    Years since quitting: 1.7    Passive exposure: Current   Smokeless tobacco: Never   Tobacco comments:    Currently 1 pack every 2-3 days    04/06/2022 patient states when she smokes its 1 cigarette    04/05/2023 Patient smokes 2 cigarettes some days   Vaping Use   Vaping status: Never Used  Substance and Sexual Activity   Alcohol use: Not Currently    Comment: socially   Drug use: No   Sexual activity: Not on file  Other Topics Concern   Not on file  Social History Narrative   Not on file   Social Drivers of Health   Financial Resource Strain: Medium Risk (05/04/2023)   Overall Financial Resource Strain (CARDIA)     Difficulty of Paying Living Expenses: Somewhat hard  Food Insecurity: No Food Insecurity (07/21/2023)   Hunger Vital Sign    Worried About Running Out of Food in the Last Year: Never true    Ran Out of Food in the Last Year: Never true  Transportation Needs: No Transportation Needs (07/21/2023)   PRAPARE - Administrator, Civil Service (Medical): No    Lack of Transportation (Non-Medical): No  Physical Activity: Inactive (03/17/2023)   Exercise Vital Sign    Days of Exercise per Week: 0 days    Minutes of Exercise per Session: 0 min  Stress: Stress Concern Present (05/04/2023)   [name] - Occupational Stress Questionnaire    Feeling of Stress : To some extent  Social Connections: Socially Isolated (03/17/2023)   Social Connection and Isolation Panel [NHANES]    Frequency of Communication with Friends and Family: More than three times a week    Frequency of Social Gatherings with Friends and Family: More than three times a week    Attends Religious Services: Never    Database administrator or Organizations: No    Attends Banker Meetings: Never    Marital Status: Widowed  Intimate Partner Violence: Not At Risk (07/21/2023)   Humiliation, Afraid, Rape, and Kick questionnaire    Fear of Current or Ex-Partner: No    Emotionally Abused: No    Physically Abused: No    Sexually Abused: No    Allergies  Allergen Reactions   Azithromycin Nausea And Vomiting   Erythromycin     Sensitivity (GI)   Penicillins Itching and Rash   Percocet [Oxycodone-Acetaminophen] Rash    No current facility-administered medications for this encounter.   Current Outpatient Medications  Medication Sig Dispense Refill   acetaminophen (TYLENOL) 325 MG tablet Take 2 tablets (650 mg total) by mouth every 4 (four) hours as needed for headache or mild pain (pain score 1-3).     albuterol (PROVENTIL) (2.5 MG/3ML) 0.083% nebulizer solution Take 3 mLs  (2.5 mg total) by nebulization every 6 (six) hours as needed for wheezing or shortness of breath. (Patient taking differently: Take 3 mLs (2.5 mg total) by nebulization every 6 (six) hours as needed for wheezing or shortness of breath.) 90 mL 12   aspirin 81 MG EC tablet Take 1 tablet (81 mg total) by mouth daily. Swallow whole. 30 tablet 0   atorvastatin (LIPITOR) 80 MG tablet TAKE 1 TABLET DAILY AT 6 P.M. (KEEP UPCOMING APPOINTMENT FOR FUTURE REFILLS 30 tablet 11   carvedilol (COREG) 3.125 MG tablet TAKE 1 TABLET TWICE A DAY WITH MEALS 180 tablet 3   clopidogrel (PLAVIX) 75 MG  tablet Take 1 tablet (75 mg total) by mouth daily. 90 tablet 0   estrogens, conjugated, (PREMARIN) 1.25 MG tablet Take 1.25 mg by mouth daily.     feeding supplement (ENSURE ENLIVE / ENSURE PLUS) LIQD Take 237 mLs by mouth 2 (two) times daily between meals. 237 mL 0   furosemide (LASIX) 40 MG tablet TAKE ONE-HALF (1/2) TABLET (20 MG TOTAL) DAILY (KEEP UPCOMING APPOINTMENT FOR FUTURE REFILLS) (Patient taking differently: Take 20 mg by mouth 2 (two) times daily.) 45 tablet 3   JARDIANCE 10 MG TABS tablet TAKE 1 TABLET DAILY 90 tablet 3   lamoTRIgine (LAMICTAL) 100 MG tablet Take 1 tablet (100 mg total) by mouth 2 (two) times daily. 180 tablet 3   levothyroxine (SYNTHROID) 25 MCG tablet Take 1 tablet (25 mcg total) by mouth daily before breakfast. 90 tablet 3   losartan (COZAAR) 50 MG tablet TAKE ONE-HALF (1/2) TABLET DAILY 90 tablet 3   potassium chloride SA (KLOR-CON M) 20 MEQ tablet TAKE 1 TABLET DAILY (KEEP UPCOMING APPOINTMENT FOR FUTURE REFILLS) (Patient taking differently: Take 20 mEq by mouth daily.) 90 tablet 3   progesterone (PROMETRIUM) 200 MG capsule Take 200 mg by mouth at bedtime.      PHYSICAL EXAM Vitals:   07/31/23 1634  BP: (!) 123/52  Pulse: 66  Resp: 18  Temp: (!) 97.4 F (36.3 C)  SpO2: 96%   Chronically ill cachectic woman in no distress Regular rate and rhythm Unlabored breathing Abdomen soft  and nontender Right groin with small hematoma versus pseudoaneurysm.  Her femoral pulses easily palpable. 2+ edema of the bilateral lower extremities from the calves down.   PERTINENT LABORATORY AND RADIOLOGIC DATA  Most recent CBC    Latest Ref Rng & Units 07/31/2023    4:57 PM 07/26/2023    3:08 AM 07/22/2023    5:08 AM  CBC  WBC 4.0 - 10.5 K/uL 8.9  7.8  9.9   Hemoglobin 12.0 - 15.0 g/dL 78.2  95.6  21.3   Hematocrit 36.0 - 46.0 % 34.2  34.3  34.4   Platelets 150 - 400 K/uL 293  174  159      Most recent CMP    Latest Ref Rng & Units 07/31/2023    4:57 PM 07/26/2023    3:08 AM 07/25/2023    5:41 AM  CMP  Glucose 70 - 99 mg/dL 76  086  89   BUN 8 - 23 mg/dL 19  10  10    Creatinine 0.44 - 1.00 mg/dL 5.78  4.69  6.29   Sodium 135 - 145 mmol/L 135  133  134   Potassium 3.5 - 5.1 mmol/L 4.0  3.6  4.0   Chloride 98 - 111 mmol/L 102  98  99   CO2 22 - 32 mmol/L 24  27  28    Calcium 8.9 - 10.3 mg/dL 8.9  8.2  8.4   Total Protein 6.5 - 8.1 g/dL 6.1     Total Bilirubin <1.2 mg/dL <5.2     Alkaline Phos 38 - 126 U/L 55     AST 15 - 41 U/L 38     ALT 0 - 44 U/L 37       Renal function Estimated Creatinine Clearance: 31.4 mL/min (by C-G formula based on SCr of 0.93 mg/dL).  Hgb A1c MFr Bld (%)  Date Value  06/10/2023 5.9 (H)    LDL Chol Calc (NIH)  Date Value Ref Range Status  06/10/2023 51  0 - 99 mg/dL Final    Rande Brunt. Lenell Antu, MD [facility] Phone Number: [phone] 07/31/2023 7:50 PM   Total time spent on preparing this encounter including chart review, data review, collecting history, examining the patient, coordinating care for this established patient, 30 minutes.  Portions of this report may have been transcribed using voice recognition software.  Every effort has been made to ensure accuracy; however, inadvertent computerized transcription errors may still be present.

## 2023-07-31 NOTE — ED Triage Notes (Signed)
Swelling of legs, intermittent .  Left sided bulging to RLQ.  Hx of cath.

## 2023-08-01 NOTE — ED Notes (Signed)
Patient verbalizes understanding of discharge instructions. Opportunity for questioning and answers were provided. Pt discharged from ED. 

## 2023-08-01 NOTE — Discharge Instructions (Addendum)
As discussed wear compression stockings and elevate your legs is much as possible.  You may also take a double dose of your Lasix for 2 days.  Please follow-up with your primary doctor.  Return immediately develop fevers, chills, chest pain, shortness of breath, severe pain or any new or worsening symptoms that are concerning to you.

## 2023-08-01 NOTE — ED Provider Notes (Signed)
[place] [facility]ENC[facility]PART[facility] AT [facility] Provider Note   CSN: [id number] Arrival date & time: 07/31/23  1532     History  Chief Complaint  Patient presents with   Leg Swelling    Christie Moreno is a 67 y.o. female.  Is a 67 year old female to the emergency department with bilateral lower extremity edema.  As well as a lump from her recent vascular procedure.  She notes that she has had some chronic leg swelling seemingly somewhat improved while in the hospital.  Noticed worsening swelling when she went home.  She been sitting primarily in chair with her legs down.  She has been taking her medications per her report.  She denies any chest pain any shortness of breath.  No dyspnea on exertion.  No orthopnea.        Home Medications Prior to Admission medications   Medication Sig Start Date End Date Taking? Authorizing Provider  acetaminophen (TYLENOL) 325 MG tablet Take 2 tablets (650 mg total) by mouth every 4 (four) hours as needed for headache or mild pain (pain score 1-3). 07/26/23   Joseph Art, DO  albuterol (PROVENTIL) (2.5 MG/3ML) 0.083% nebulizer solution Take 3 mLs (2.5 mg total) by nebulization every 6 (six) hours as needed for wheezing or shortness of breath. Patient taking differently: Take 3 mLs (2.5 mg total) by nebulization every 6 (six) hours as needed for wheezing or shortness of breath. 11/06/21   Arrien, York Ram, MD  aspirin 81 MG EC tablet Take 1 tablet (81 mg total) by mouth daily. Swallow whole. 11/06/21   Arrien, York Ram, MD  atorvastatin (LIPITOR) 80 MG tablet TAKE 1 TABLET DAILY AT 6 P.M. (KEEP UPCOMING APPOINTMENT FOR FUTURE REFILLS 05/11/23   Runell Gess, MD  carvedilol (COREG) 3.125 MG tablet TAKE 1 TABLET TWICE A DAY WITH MEALS 01/03/23   Joylene Grapes, NP  clopidogrel (PLAVIX) 75 MG tablet Take 1 tablet (75 mg total) by mouth daily. 07/27/23   Joseph Art, DO  estrogens, conjugated, (PREMARIN) 1.25 MG tablet  Take 1.25 mg by mouth daily.    [provider]  feeding supplement (ENSURE ENLIVE / ENSURE PLUS) LIQD Take 237 mLs by mouth 2 (two) times daily between meals. 11/06/21   Arrien, York Ram, MD  furosemide (LASIX) 40 MG tablet TAKE ONE-HALF (1/2) TABLET (20 MG TOTAL) DAILY (KEEP UPCOMING APPOINTMENT FOR FUTURE REFILLS) Patient taking differently: Take 20 mg by mouth 2 (two) times daily. 05/31/23   Runell Gess, MD  JARDIANCE 10 MG TABS tablet TAKE 1 TABLET DAILY 01/07/23   Runell Gess, MD  lamoTRIgine (LAMICTAL) 100 MG tablet Take 1 tablet (100 mg total) by mouth 2 (two) times daily. 04/14/23   Glean Salvo, NP  levothyroxine (SYNTHROID) 25 MCG tablet Take 1 tablet (25 mcg total) by mouth daily before breakfast. 06/24/23   Melida Quitter, PA  losartan (COZAAR) 50 MG tablet TAKE ONE-HALF (1/2) TABLET DAILY 01/24/23   Runell Gess, MD  potassium chloride SA (KLOR-CON M) 20 MEQ tablet TAKE 1 TABLET DAILY (KEEP UPCOMING APPOINTMENT FOR FUTURE REFILLS) Patient taking differently: Take 20 mEq by mouth daily. 02/28/23   Corrin Parker, PA-C  progesterone (PROMETRIUM) 200 MG capsule Take 200 mg by mouth at bedtime. 04/20/22   [provider]      Allergies    Azithromycin, Erythromycin, Penicillins, and Percocet [oxycodone-acetaminophen]    Review of Systems   Review of Systems  Physical  Exam Updated Vital Signs BP (!) 131/58   Pulse 69   Temp 97.8 F (36.6 C)   Resp 16   SpO2 94%  Physical Exam Vitals and nursing note reviewed.  Constitutional:      General: She is not in acute distress.    Appearance: She is not toxic-appearing.  HENT:     Head: Normocephalic.  Eyes:     Conjunctiva/sclera: Conjunctivae normal.  Cardiovascular:     Rate and Rhythm: Normal rate and regular rhythm.  Pulmonary:     Effort: Pulmonary effort is normal.     Breath sounds: Normal breath sounds.  Abdominal:     General: Abdomen is flat. There is no distension.      Tenderness: There is no abdominal tenderness. There is no guarding or rebound.  Musculoskeletal:        General: Normal range of motion.     Right lower leg: Edema present.     Left lower leg: Edema present.  Skin:    General: Skin is warm.     Capillary Refill: Capillary refill takes less than 2 seconds.  Neurological:     General: No focal deficit present.     Mental Status: She is alert and oriented to person, place, and time.  Psychiatric:        Mood and Affect: Mood normal.        Behavior: Behavior normal.     ED Results / Procedures / Treatments   Labs (all labs ordered are listed, but only abnormal results are displayed) Labs Reviewed  CBC WITH DIFFERENTIAL/PLATELET - Abnormal; Notable for the following components:      Result Value   RBC 3.64 (*)    Hemoglobin 11.2 (*)    HCT 34.2 (*)    All other components within normal limits  COMPREHENSIVE METABOLIC PANEL - Abnormal; Notable for the following components:   Total Protein 6.1 (*)    Albumin 3.0 (*)    All other components within normal limits  BRAIN NATRIURETIC PEPTIDE - Abnormal; Notable for the following components:   B Natriuretic Peptide 375.9 (*)    All other components within normal limits    EKG None  Radiology DG Chest 2 View Result Date: 07/31/2023 CLINICAL DATA:  Lower extremity swelling, evaluate for vascular congestion. EXAM: CHEST - 2 VIEW COMPARISON:  PA and lateral chest 07/10/2023, CTA chest 11/02/2021 FINDINGS: The cardiac size is normal. No vascular congestion is seen. Vascular fullness chronically noted left hilar silhouette. Calcifications in the mitral ring. Calcification of the transverse aorta. Normal mediastinal configuration. There is advanced emphysematous disease of the lungs. No focal infiltrate or effusion is seen. A short stent is newly noted in the medial left upper abdomen on the PA but is nonvisualized on the lateral view. Mild degenerative change thoracic spine. IMPRESSION: 1. No  evidence of acute chest disease. 2. Advanced emphysematous disease.  Stable COPD chest. 3. Aortic atherosclerosis. 4. Short stent newly noted in the medial left upper abdomen on the PA but is nonvisualized on the lateral view. Electronically Signed   By: Almira Bar M.D.   On: 07/31/2023 20:26    Procedures Procedures    Medications Ordered in ED Medications - No data to display  ED Course/ Medical Decision Making/ A&P Clinical Course as of 08/01/23 0838  Mon Aug 01, 2023  1610 Echo 03/15/23: 'IMPRESSIONS     1. Left ventricular ejection fraction, by estimation, is 60 to 65%. The  left ventricle has  normal function. The left ventricle has no regional  wall motion abnormalities. Left ventricular diastolic parameters are  consistent with Grade I diastolic  dysfunction (impaired relaxation). The average left ventricular global  longitudinal strain is -19.3 %. The global longitudinal strain is normal.   2. Right ventricular systolic function is normal. The right ventricular  size is normal. There is normal pulmonary artery systolic pressure.   3. The mitral valve is degenerative. Mild to moderate mitral valve  regurgitation. Mild mitral stenosis. Moderate mitral annular  calcification.   4. The aortic valve is bicuspid. There is severe calcifcation of the  aortic valve. There is moderate thickening of the aortic valve. Aortic  valve regurgitation is moderate to severe. Mild aortic valve stenosis.  Aortic valve mean gradient measures 9.0  mmHg. Aortic valve Vmax measures 2.11 m/s.   5. The inferior vena cava is normal in size with greater than 50%  respiratory variability, suggesting right atrial pressure of 3 mmHg. " [TY]    Clinical Course User Index [TY] Coral Spikes, DO                                 Medical Decision Making This is a 67 year old female presenting emergency department for bilateral lower extremity edema.  She is afebrile nontachycardic hemodynamically  stable.  Does have symmetrical bilateral lower extremity edema.  Per chart review does have diastolic heart failure and is on 20 mg of Lasix.  Recent admission for mesenteric ischemia status post stent placement.  Incision site with some bruising, but overall well-appearing.  Per provider in triage note vascular surgery has been contacted and will see patient in clinic.  She has no fever or tachycardia or leukocytosis to suggest systemic infection.  CMP with no metabolic derangement.  Normal kidney function.  No transaminitis to suggest hepatobiliary disease or hepatorenal syndrome.  Her BNP is mildly elevated at 375, appears improved compared to prior.  Chest x-ray without pulmonary edema or vascular congestion.  Low suspicion for acute decompensated heart failure.  Also given symmetrical nature low suspicion for DVT.  Compartments are soft, 2+ radial pulses.  Low suspicion for compartment syndrome.  I suspect her lower extremity edema is multifactorial may be some component of heart failure versus dependent edema.  Told patient to wear compression stockings elevate leg.  Double Lasix x 2 days.  Patient has follow-up with primary doctors in the next few days.  Stable for discharge at this time.          Final Clinical Impression(s) / ED Diagnoses Final diagnoses:  None    Rx / DC Orders ED Discharge Orders     None         Coral Spikes, DO 08/01/23 [phone]

## 2023-08-02 ENCOUNTER — Ambulatory Visit (HOSPITAL_COMMUNITY)
Admission: RE | Admit: 2023-08-02 | Discharge: 2023-08-02 | Disposition: A | Discharge status: Home / Self Care | Payer: Medicare Other | Source: Ambulatory Visit | Attending: Vascular Surgery | Admitting: Vascular Surgery

## 2023-08-02 ENCOUNTER — Other Ambulatory Visit: Payer: Self-pay | Admitting: *Deleted

## 2023-08-02 ENCOUNTER — Ambulatory Visit: Payer: Medicare Other | Admitting: Family Medicine

## 2023-08-02 DIAGNOSIS — I729 Aneurysm of unspecified site: Secondary | ICD-10-CM | POA: Insufficient documentation

## 2023-08-02 DIAGNOSIS — I724 Aneurysm of artery of lower extremity: Secondary | ICD-10-CM | POA: Diagnosis not present

## 2023-08-04 NOTE — Progress Notes (Signed)
 Patient ID: Christie Moreno, female   DOB: 1955/10/09, 68 y.o.   MRN: [id number]  Reason for Consult: Routine Post Op   Referred by Christie Moreno LABOR, PA  Subjective:     HPI  Christie Moreno is a 68 y.o. female with chronic mesenteric ischemia who underwent SMA lithotripsy and stenting on 07/25/2023.  She presented to the emergency room with concern for swelling in her right groin which is where the access site was.  She was seen by one of my partners and noted a small pea-sized knot in the right groin that was not concerning for a large hematoma or pseudoaneurysm.  Today she reports she still has the nodule although it is not tender and has not grown. She has been eating and drinking without significant abdominal pain.  She is compliant with dual antiplatelet  Past Medical History:  Diagnosis Date   Complex partial seizure (HCC) 04/22/2015   Convulsions (HCC) 04/19/2013   Heart failure (HCC)    Seizures (HCC)    none in years, recently had medication change   Family History  Adopted: Yes  Problem Relation Age of Onset   Breast cancer Mother    Diabetes Mother    Cancer Mother        breast   Stroke Mother    Heart Problems Father    Diabetes Sister    Kidney failure Sister    Breast cancer Maternal Aunt    Lung cancer Half-Sister    Colon cancer Neg Hx    Esophageal cancer Neg Hx    Epilepsy Neg Hx    Past Surgical History:  Procedure Laterality Date   COLONOSCOPY     PERIPHERAL INTRAVASCULAR LITHOTRIPSY  07/25/2023   Procedure: PERIPHERAL INTRAVASCULAR LITHOTRIPSY;  Surgeon: Pearline Norman RAMAN, MD;  Location: [facility] INVASIVE CV LAB;  Service: Cardiovascular;;   PERIPHERAL VASCULAR INTERVENTION  07/25/2023   Procedure: PERIPHERAL VASCULAR INTERVENTION;  Surgeon: Pearline Norman RAMAN, MD;  Location: [facility] INVASIVE CV LAB;  Service: Cardiovascular;;   RIGHT/LEFT HEART CATH AND CORONARY ANGIOGRAPHY N/A 11/05/2021   Procedure: RIGHT/LEFT HEART CATH AND CORONARY ANGIOGRAPHY;   Surgeon: Claudene Victory ORN, MD;  Location: [facility] INVASIVE CV LAB;  Service: Cardiovascular;  Laterality: N/A;   VISCERAL ANGIOGRAPHY N/A 07/25/2023   Procedure: VISCERAL ANGIOGRAPHY;  Surgeon: Pearline Norman RAMAN, MD;  Location: [facility] INVASIVE CV LAB;  Service: Cardiovascular;  Laterality: N/A;    Short Social History:  Social History   Tobacco Use   Smoking status: Some Days    Current packs/day: 0.00    Average packs/day: 1 pack/day for 52.0 years (52.0 ttl pk-yrs)    Types: Cigarettes    Start date: 11/09/1969    Last attempt to quit: 11/09/2021    Years since quitting: 1.7    Passive exposure: Current   Smokeless tobacco: Never   Tobacco comments:    Currently 1 pack every 2-3 days    04/06/2022 patient states when she smokes its 1 cigarette    04/05/2023 Patient smokes 2 cigarettes some days   Substance Use Topics   Alcohol use: Not Currently    Comment: socially    Allergies  Allergen Reactions   Azithromycin Nausea And Vomiting   Erythromycin     Sensitivity (GI)   Penicillins Itching and Rash   Percocet [Oxycodone-Acetaminophen ] Rash    Current Outpatient Medications  Medication Sig Dispense Refill   acetaminophen  (TYLENOL ) 325 MG tablet Take 2 tablets (650 mg total) by mouth every 4 (  four) hours as needed for headache or mild pain (pain score 1-3).     albuterol  (PROVENTIL ) (2.5 MG/3ML) 0.083% nebulizer solution Take 3 mLs (2.5 mg total) by nebulization every 6 (six) hours as needed for wheezing or shortness of breath. (Patient taking differently: Take 3 mLs (2.5 mg total) by nebulization every 6 (six) hours as needed for wheezing or shortness of breath.) 90 mL 12   aspirin  81 MG EC tablet Take 1 tablet (81 mg total) by mouth daily. Swallow whole. 30 tablet 0   atorvastatin  (LIPITOR ) 80 MG tablet TAKE 1 TABLET DAILY AT 6 P.M. (KEEP UPCOMING APPOINTMENT FOR FUTURE REFILLS 30 tablet 11   carvedilol  (COREG ) 3.125 MG tablet TAKE 1 TABLET TWICE A DAY WITH MEALS 180 tablet 3    clopidogrel  (PLAVIX ) 75 MG tablet Take 1 tablet (75 mg total) by mouth daily. 90 tablet 0   estrogens , conjugated, (PREMARIN ) 1.25 MG tablet Take 1.25 mg by mouth daily.     feeding supplement (ENSURE ENLIVE / ENSURE PLUS) LIQD Take 237 mLs by mouth 2 (two) times daily between meals. 237 mL 0   furosemide  (LASIX ) 40 MG tablet TAKE ONE-HALF (1/2) TABLET (20 MG TOTAL) DAILY (KEEP UPCOMING APPOINTMENT FOR FUTURE REFILLS) (Patient taking differently: Take 20 mg by mouth 2 (two) times daily.) 45 tablet 3   JARDIANCE  10 MG TABS tablet TAKE 1 TABLET DAILY 90 tablet 3   lamoTRIgine  (LAMICTAL ) 100 MG tablet Take 1 tablet (100 mg total) by mouth 2 (two) times daily. 180 tablet 3   levothyroxine  (SYNTHROID ) 25 MCG tablet Take 1 tablet (25 mcg total) by mouth daily before breakfast. 90 tablet 3   losartan  (COZAAR ) 50 MG tablet TAKE ONE-HALF (1/2) TABLET DAILY 90 tablet 3   potassium chloride  SA (KLOR-CON  M) 20 MEQ tablet TAKE 1 TABLET DAILY (KEEP UPCOMING APPOINTMENT FOR FUTURE REFILLS) (Patient taking differently: Take 20 mEq by mouth daily.) 90 tablet 3   progesterone  (PROMETRIUM ) 200 MG capsule Take 200 mg by mouth at bedtime.     No current facility-administered medications for this visit.    REVIEW OF SYSTEMS  Positive for leg swelling  All other systems were reviewed and are negative     Objective:  Objective   Vitals:   08/05/23 0948  BP: 139/82  Pulse: 62  Resp: 16  Temp: 98.1 F (36.7 C)  SpO2: 97%  Weight: 81 lb 12.8 oz (37.1 kg)  Height: 5' 1 (1.549 m)   Body mass index is 15.46 kg/m.  Physical Exam General: no acute distress Cardiac: hemodynamically stable, nontachycardic Pulm: normal work of breathing Neuro: alert, no focal deficit Extremities: Small pea-sized nodule in right groin, moderate 2+ edema bilaterally from ankles to knees.  No wounds   Data: Right groin pseudoaneurysm duplex +------------+----------+--------+------+----------+  Right DuplexPSV  (cm/s)WaveformPlaqueComment(s)  +------------+----------+--------+------+----------+  Ext.Iliac     110    biphasic                  +------------+----------+--------+------+----------+  CFA            79    biphasic                  +------------+----------+--------+------+----------+  PFA            72    biphasic                  +------------+----------+--------+------+----------+  Prox SFA        61    biphasic                  +------------+----------+--------+------+----------+  Right Vein comments:Patent EIV, CFV, FV and PROF V    Summary:    - No evidence of pseudoaneurysm, AVF or DVT  - Vascularized structure is noted within the right groin suggestive of  enlarged  lymph node.       Assessment/Plan:     MAKIAH FOYE is a 68 y.o. female with chronic mesenteric ischemia who is s/p SMA lithotripsy and stenting via right groin access on 07/25/2023.  I explained that the duplex was negative for any arterial injury and there is likely a reactive lymph node in that area.  She is tolerating p.o. without significant abdominal pain and continues increase her diet.  Explained that she will need to continue dual antiplatelet. Plan to continue with original scheduled follow-up on August 26, 2023 with a mesenteric duplex   Recommendations to optimize cardiovascular risk: Abstinence from all tobacco products. Blood glucose control with goal A1c < 7%. Blood pressure control with goal blood pressure < 140/90 mmHg. Lipid reduction therapy with goal LDL-C <100 mg/dL  Aspirin  81mg  PO QD.  Atorvastatin  40-80mg  PO QD (or other high intensity statin therapy).     Norman GORMAN Serve MD [facility]

## 2023-08-05 ENCOUNTER — Ambulatory Visit (INDEPENDENT_AMBULATORY_CARE_PROVIDER_SITE_OTHER): Payer: Medicare Other | Admitting: Vascular Surgery

## 2023-08-05 ENCOUNTER — Encounter: Payer: Self-pay | Admitting: Vascular Surgery

## 2023-08-05 VITALS — BP 139/82 | HR 62 | Temp 98.1°F | Resp 16 | Ht 61.0 in | Wt 81.8 lb

## 2023-08-05 DIAGNOSIS — K551 Chronic vascular disorders of intestine: Secondary | ICD-10-CM | POA: Diagnosis not present

## 2023-08-09 ENCOUNTER — Telehealth: Payer: Self-pay | Admitting: Cardiovascular Disease

## 2023-08-09 NOTE — Telephone Encounter (Signed)
 Called patient to let her know that I had forwarded her message to Dr Hetty Blend to review her questions on the BLE edema. She states that she will call that office as well.

## 2023-08-09 NOTE — Telephone Encounter (Signed)
 Pt c/o swelling: STAT is pt has developed SOB within 24 hours  How much weight have you gained and in what time span? 2 lbs in a day   If swelling, where is the swelling located? Both legs  Are you currently taking a fluid pill? Yes   Are you currently SOB? Occasionally   Do you have a log of your daily weights (if so, list)?   Have you gained 3 pounds in a day or 5 pounds in a week? No   Have you traveled recently? No

## 2023-08-09 NOTE — Telephone Encounter (Signed)
 Called patient who states the following. Was seen in ER for BLE edema after mesenteric procedure and was told to start wearing compression stockings and to elevate legs. States that after she had a MRI she thinks her shortness of breath has been slowly getting worse. Needs follow up on shunt placement per patient. States she is taking 20 mg Furosemide  daily. States she has follow up with Dr Court on 1/29. Has follow up with Dr Pearline on 1/24. Not sure who was supposed to discuss increasing BLE edema with. I informed her I would forward her message to Dr Court as she had called our office instead of Dr Pearline.

## 2023-08-11 ENCOUNTER — Ambulatory Visit: Payer: Self-pay | Admitting: Licensed Clinical Social Worker

## 2023-08-11 DIAGNOSIS — F419 Anxiety disorder, unspecified: Secondary | ICD-10-CM

## 2023-08-11 NOTE — Patient Instructions (Signed)
 Visit Information  Thank you for taking time to visit with me today. Please don't hesitate to contact me if I can be of assistance to you.   Following are the goals we discussed today:   Goals Addressed             This Visit's Progress    Reduce symptoms related to stress and depression       Activities and task to complete in order to accomplish goals.   Complete paperwork once received by mail and return to TTC office for initial intake scheduling - make part of morning routine. Call or go to Department of Social Services to inquire about food stamps and other financial support (bills) if needed Consider local food pantry sites to help supplement your food needs- [facility] (select find help) - Find Food  [place], [place] (ghpfa.org) or download the free app to your smart phone Food and Nutrition Services - 321-377-4818 or apply online at [facility] - ePASS [employer] Referral Service Dial [phone] or [phone] to speak with a call specialist.   Follow up on food resources discussed Start / continue relaxed breathing 3 times daily Attend all upcoming appointment discussed today Consider local hospice agency for free grief and loss support and counseling Utilize food resources that are available to patient. Utilize coping skills including mindfulness and doing activities to boost mood (watching favorite shows, reading) Schedule times to complete activities such as cleaning or filling out paperwork Discussed processes of behavioral activation and how she can incorporate into daily routine.         Our next appointment is by telephone on 08/25/2023.  Please call the care guide team at [phone] if you need to cancel or reschedule your appointment.   If you are experiencing a Mental Health or Behavioral Health Crisis or need someone to talk to, please call the Suicide and Crisis Lifeline: 988  Patient verbalizes understanding of  instructions and care plan provided today and agrees to view in MyChart. Active MyChart status and patient understanding of how to access instructions and care plan via MyChart confirmed with patient.     Telephone follow up appointment with care management team member scheduled for: 08/25/2023

## 2023-08-11 NOTE — Patient Outreach (Signed)
  Care Coordination   Follow Up Visit Note   08/11/2023 Name: Christie Moreno MRN: [id number] DOB: September 16, 1955  Christie Moreno is a 68 y.o. year old female who sees Wallace Joesph LABOR, [place] for primary care. I spoke with  Christie Moreno by phone today.  What matters to the patients health and wellness today?  Being able to better manage all of my medical issues.   Goals Addressed             This Visit's Progress    Reduce symptoms related to stress and depression       Activities and task to complete in order to accomplish goals.   Complete paperwork once received by mail and return to TTC office for initial intake scheduling - make part of morning routine. Call or go to Department of Social Services to inquire about food stamps and other financial support (bills) if needed Consider local food pantry sites to help supplement your food needs- [facility] (select find help) - Find Food  [place], [place] (ghpfa.org) or download the free app to your smart phone Food and Nutrition Services - 613-238-3148 or apply online at [facility] - ePASS United Way Referral Service Dial [phone] or [phone] to speak with a call specialist.   Follow up on food resources discussed Start / continue relaxed breathing 3 times daily Attend all upcoming appointment discussed today Consider local hospice agency for free grief and loss support and counseling Utilize food resources that are available to patient. Utilize coping skills including mindfulness and doing activities to boost mood (watching favorite shows, reading) Schedule times to complete activities such as cleaning or filling out paperwork Discussed processes of behavioral activation and how she can incorporate into daily routine.         SDOH assessments and interventions completed:  Yes       Care Coordination Interventions:  Yes, provided   Interventions Today    Flowsheet Row Most  Recent Value  Chronic Disease   Chronic disease during today's visit Other  [Depression]  General Interventions   General Interventions Discussed/Reviewed Referral to Nurse  [Will make referral to [facility] for additional support with complex diseases.]  Education Interventions   Education Provided Provided Education  [CSW and patient discussed basics of behavioral activation and how she can use it to increase her activity and mood.]  Mental Health Interventions   Mental Health Discussed/Reviewed Mental Health Discussed, Mental Health Reviewed, Coping Strategies, Anxiety, Depression  [Pt reported feeling overwhelmed with multiple doctor's visits and recent hospitalization. Pt had not completed TTC paperwork for counseling due to high level of stress. CSW and pt discussed her stressors and reviewed her coping skills.mindfulness.]        Follow up plan: Follow up call scheduled for 08/25/2023    Encounter Outcome:  Patient Visit Completed   Alm Armor, LCSW [facility], [facility] Licensed Clinical Social Worker Care Coordinator [phone]

## 2023-08-11 NOTE — Telephone Encounter (Signed)
 Response from MD.  Called patient and discussed message listed below.  She voiced understanding to take lasix  40 mg and continue to monitor level of edema and shortness of breath. States will go to ER if symptoms don't improve or respond to the med change. Encouraged to keep already scheduled appointments with both Dr Court and Dr Pearline.  Please let her know to take double her lasix  until her follow up and if she has worsening SOB she'll need to present to ED for cardiac evaluation.  Thank you   Pearline Norman RAMAN, MD  Yvonne (8:17 AM)   TB I can increase her dose of lasix  to 40mg  daily. Might help until her follow up with Dr. Court on Jan 29th.   Pearline Norman RAMAN, MD  Yvonne (8:04 AM)   TB I saw her in clinic last week and encouraged compression stocking use. Very unlikely that a mesenteric angioplasty would cause leg swelling. She has a history of heart failure and was admitted with an exacerbation the past. If she's having increased SOB and worsening leg swelling she likely needs her diuretic and possibly other heart failure meds adjusted.

## 2023-08-12 ENCOUNTER — Ambulatory Visit
Admit: 2023-08-12 | Discharge: 2023-08-12 | Disposition: A | Discharge status: Home / Self Care | Payer: Medicare Other | Attending: Obstetrics and Gynecology | Admitting: Obstetrics and Gynecology

## 2023-08-12 ENCOUNTER — Telehealth: Payer: Self-pay | Admitting: *Deleted

## 2023-08-12 DIAGNOSIS — N6320 Unspecified lump in the left breast, unspecified quadrant: Secondary | ICD-10-CM

## 2023-08-12 NOTE — Progress Notes (Signed)
 Complex Care Management Care Guide Note  08/12/2023 Name: Christie Moreno MRN: [id number] DOB: 02-27-1956  Christie Moreno is a 68 y.o. year old female who is a primary care patient of Wallace Joesph LABOR, [place] and is actively engaged with the care management team. I reached out to Christie Moreno by phone today to assist with scheduling  with the RN Case Manager.  Follow up plan: Telephone appointment with complex care management team member scheduled for:  08/24/23  [facility]  Care Coordination Care Guide  Direct Dial: [phone]

## 2023-08-13 ENCOUNTER — Other Ambulatory Visit: Payer: Self-pay | Admitting: Student

## 2023-08-19 ENCOUNTER — Other Ambulatory Visit: Payer: Self-pay

## 2023-08-19 DIAGNOSIS — K551 Chronic vascular disorders of intestine: Secondary | ICD-10-CM

## 2023-08-22 ENCOUNTER — Telehealth: Payer: Self-pay | Admitting: Neurology

## 2023-08-22 MED ORDER — LAMOTRIGINE 100 MG PO TABS: 100.0000 mg | ORAL_TABLET | Freq: Two times a day (BID) | ORAL | 0 refills | Status: DC

## 2023-08-22 NOTE — Telephone Encounter (Signed)
Refilled

## 2023-08-22 NOTE — Telephone Encounter (Signed)
As a result of today being a holiday pt has not received her expected mail delivery of her  lamoTRIgine (LAMICTAL) 100 MG tablet Pt is asking if a weeks worth can be called into [id number], pt does not have any for her afternoon dose today

## 2023-08-24 NOTE — Progress Notes (Signed)
Patient ID: Christie Moreno, female   DOB: 05/18/1956, 68 y.o.   MRN: [id number]  Reason for Consult: Follow-up   Referred by Melida Quitter, PA  Subjective:     HPI  Christie Moreno is a 68 y.o. female with chronic mesenteric ischemia who underwent SMA intravascular lithotripsy and stenting on 07/25/2023.  She presents for follow-up with vascular lab studies today.  She has been taking in p.o. without any abdominal pain and reports some weight gain. She is compliant with dual antiplatelet. She has started to develop pain in the left foot.  She does report intermittent pain at night that causes her to have to get out of bed.  She also has some discoloration to the left foot.  Denies wounds.  Past Medical History:  Diagnosis Date   Complex partial seizure (HCC) 04/22/2015   Convulsions (HCC) 04/19/2013   Heart failure (HCC)    Seizures (HCC)    none in years, recently had medication change   Family History  Adopted: Yes  Problem Relation Age of Onset   Breast cancer Mother    Diabetes Mother    Cancer Mother        breast   Stroke Mother    Heart Problems Father    Diabetes Sister    Kidney failure Sister    Breast cancer Maternal Aunt    Lung cancer Half-Sister    Colon cancer Neg Hx    Esophageal cancer Neg Hx    Epilepsy Neg Hx    Past Surgical History:  Procedure Laterality Date   COLONOSCOPY     PERIPHERAL INTRAVASCULAR LITHOTRIPSY  07/25/2023   Procedure: PERIPHERAL INTRAVASCULAR LITHOTRIPSY;  Surgeon: Daria Pastures, MD;  Location: [facility] INVASIVE CV LAB;  Service: Cardiovascular;;   PERIPHERAL VASCULAR INTERVENTION  07/25/2023   Procedure: PERIPHERAL VASCULAR INTERVENTION;  Surgeon: Daria Pastures, MD;  Location: [facility] INVASIVE CV LAB;  Service: Cardiovascular;;   RIGHT/LEFT HEART CATH AND CORONARY ANGIOGRAPHY N/A 11/05/2021   Procedure: RIGHT/LEFT HEART CATH AND CORONARY ANGIOGRAPHY;  Surgeon: Lyn Records, MD;  Location: [facility] INVASIVE CV LAB;  Service:  Cardiovascular;  Laterality: N/A;   VISCERAL ANGIOGRAPHY N/A 07/25/2023   Procedure: VISCERAL ANGIOGRAPHY;  Surgeon: Daria Pastures, MD;  Location: [facility] INVASIVE CV LAB;  Service: Cardiovascular;  Laterality: N/A;    Short Social History:  Social History   Tobacco Use   Smoking status: Some Days    Current packs/day: 0.00    Average packs/day: 1 pack/day for 52.0 years (52.0 ttl pk-yrs)    Types: Cigarettes    Start date: 11/09/1969    Last attempt to quit: 11/09/2021    Years since quitting: 1.7    Passive exposure: Current   Smokeless tobacco: Never   Tobacco comments:    Currently 1 pack every 2-3 days    04/06/2022 patient states when she smokes its 1 cigarette    04/05/2023 Patient smokes 2 cigarettes some days   Substance Use Topics   Alcohol use: Not Currently    Comment: socially    Allergies  Allergen Reactions   Azithromycin Nausea And Vomiting   Erythromycin     Sensitivity (GI)   Penicillins Itching and Rash   Percocet [Oxycodone-Acetaminophen] Rash    Current Outpatient Medications  Medication Sig Dispense Refill   acetaminophen (TYLENOL) 325 MG tablet Take 2 tablets (650 mg total) by mouth every 4 (four) hours as needed for headache or mild pain (pain score 1-3).  albuterol (PROVENTIL) (2.5 MG/3ML) 0.083% nebulizer solution Take 3 mLs (2.5 mg total) by nebulization every 6 (six) hours as needed for wheezing or shortness of breath. (Patient taking differently: Take 3 mLs (2.5 mg total) by nebulization every 6 (six) hours as needed for wheezing or shortness of breath.) 90 mL 12   aspirin 81 MG EC tablet Take 1 tablet (81 mg total) by mouth daily. Swallow whole. 30 tablet 0   atorvastatin (LIPITOR) 80 MG tablet TAKE 1 TABLET DAILY AT 6 P.M. (KEEP UPCOMING APPOINTMENT FOR FUTURE REFILLS 30 tablet 11   carvedilol (COREG) 3.125 MG tablet TAKE 1 TABLET TWICE A DAY WITH MEALS 180 tablet 3   clopidogrel (PLAVIX) 75 MG tablet Take 1 tablet (75 mg total) by mouth daily.  90 tablet 0   estrogens, conjugated, (PREMARIN) 1.25 MG tablet Take 1.25 mg by mouth daily.     feeding supplement (ENSURE ENLIVE / ENSURE PLUS) LIQD Take 237 mLs by mouth 2 (two) times daily between meals. 237 mL 0   furosemide (LASIX) 40 MG tablet TAKE ONE-HALF (1/2) TABLET (20 MG TOTAL) DAILY (KEEP UPCOMING APPOINTMENT FOR FUTURE REFILLS) (Patient taking differently: Take 20 mg by mouth 2 (two) times daily.) 45 tablet 3   JARDIANCE 10 MG TABS tablet TAKE 1 TABLET DAILY 90 tablet 3   lamoTRIgine (LAMICTAL) 100 MG tablet Take 1 tablet (100 mg total) by mouth 2 (two) times daily. 60 tablet 0   levothyroxine (SYNTHROID) 25 MCG tablet Take 1 tablet (25 mcg total) by mouth daily before breakfast. 90 tablet 3   losartan (COZAAR) 50 MG tablet TAKE ONE-HALF (1/2) TABLET DAILY 90 tablet 3   potassium chloride SA (KLOR-CON M) 20 MEQ tablet TAKE 1 TABLET DAILY (KEEP UPCOMING APPOINTMENT FOR FUTURE REFILLS) (Patient taking differently: Take 20 mEq by mouth daily.) 90 tablet 3   progesterone (PROMETRIUM) 200 MG capsule Take 200 mg by mouth at bedtime.     No current facility-administered medications for this visit.    REVIEW OF SYSTEMS  All other systems were reviewed and are negative     Objective:  Objective   Vitals:   08/26/23 0829  BP: (!) 84/45  Pulse: 61  Resp: 20  Temp: 97.8 F (36.6 C)  SpO2: 94%  Weight: 77 lb 11.2 oz (35.2 kg)  Height: 5\' 1"  (1.549 m)   Body mass index is 14.68 kg/m.  Physical Exam General: no acute distress Cardiac: hemodynamically stable Pulm: normal work of breathing Neuro: alert, no focal deficit Extremities: Mixed venous congestion and dependent rubor of the left foot, 1+ edema bilaterally.  No wounds Vascular:   Right: Multiphasic DP, PT  Left: Monophasic PT   Data: Mesenteric duplex Patent SMA stent  ----------------------+--------+--------+------+--------+  Mesenteric           PSV cm/sEDV cm/sPlaqueComments   +----------------------+--------+--------+------+--------+  Aorta at Celiac          20                           +----------------------+--------+--------+------+--------+  Aorta Prox               37                           +----------------------+--------+--------+------+--------+  Aorta Mid                20  plaque   +----------------------+--------+--------+------+--------+  Aorta Distal             28                           +----------------------+--------+--------+------+--------+  Celiac Artery Origin    375                           +----------------------+--------+--------+------+--------+  Celiac Artery Proximal  383                           +----------------------+--------+--------+------+--------+  SMA Proximal            161      35                   +----------------------+--------+--------+------+--------+  SMA Mid                 158      33                   +----------------------+--------+--------+------+--------+  CHA                    159                           +----------------------+--------+--------+------+--------+  Splenic                158                           +----------------------+--------+--------+------+--------+         Right Stent(s):  +---------------+--------+--------+----------+--------+  SMA           PSV cm/sStenosisWaveform  Comments  +---------------+--------+--------+----------+--------+  Prox to Stent  37                                  +---------------+--------+--------+----------+--------+  Proximal Stent 164             monophasic          +---------------+--------+--------+----------+--------+  Mid Stent      158                                 +---------------+--------+--------+----------+--------+  Distal Stent   179                                 +---------------+--------+--------+----------+--------+   Distal to Stent327                                 +---------------+--------+--------+----------+--------+      Assessment/Plan:     Christie Moreno is a 68 y.o. female with chronic mesenteric ischemia who is s/p SMA intravascular lithotripsy and stenting on 07/25/2023.  She is tolerating p.o. without abdominal pain.  Mesenteric duplex demonstrates widely patent stent.  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  I explained that I would like to see her back  within the next 3 to 4 weeks with an ABI.  During that time she will pay attention to exactly what foot pain she is having and when and how many times per week.  Follow-up in 2 to 4 weeks with an ABI    Recommendations to optimize cardiovascular risk: Abstinence from all tobacco products. Blood glucose control with goal A1c < 7%. Blood pressure control with goal blood pressure < 140/90 mmHg. Lipid reduction therapy with goal LDL-C <100 mg/dL  Aspirin 81mg  PO QD.  Atorvastatin 40-80mg  PO QD (or other "high intensity" statin therapy).     Daria Pastures MD [facility]

## 2023-08-25 ENCOUNTER — Ambulatory Visit: Payer: Self-pay | Admitting: Licensed Clinical Social Worker

## 2023-08-25 NOTE — Patient Outreach (Signed)
  Care Coordination   Follow Up Visit Note   08/25/2023 Name: Christie Moreno MRN: [id number] DOB: 10-14-1955  Christie Moreno is a 68 y.o. year old female who sees Melida Quitter, [place] for primary care. I spoke with  Christie Moreno by phone today.  What matters to the patients health and wellness today?  Working on motivation to [employer].    Goals Addressed             This Visit's Progress    Reduce symptoms related to stress and depression       Activities and task to complete in order to accomplish goals.   Complete paperwork once received by mail and return to TTC office for initial intake scheduling - make part of morning routine. Call or go to Department of Social Services to inquire about food stamps and other financial support (bills) if needed Consider local food pantry sites to help supplement your food needs- [facility] (select "find help") - Find Food  [place], [place] (ghpfa.org) or download the free app to your smart phone Food and Nutrition Services - 802-419-1622 or apply online at [facility] - ePASS [employer] Referral Service Dial [phone] or [phone] to speak with a call specialist.   Follow up on food resources discussed Start / continue relaxed breathing 3 times daily Attend all upcoming appointment discussed today Consider local hospice agency for free grief and loss support and counseling Utilize food resources that are available to patient. Utilize coping skills including mindfulness and doing activities to boost mood (watching favorite shows, reading) Schedule times to complete activities such as cleaning or filling out paperwork Review MH resources sent in the mail  Dedicate scheduled times to work towards goals/ re-frame goals         SDOH assessments and interventions completed:  Yes  SDOH Interventions Today    Flowsheet Row Most Recent Value  SDOH Interventions   Depression  Interventions/Treatment  Community Resources Provided        Care Coordination Interventions:  Yes, provided   Interventions Today    Flowsheet Row Most Recent Value  Chronic Disease   Chronic disease during today's visit Other  [Depression]  Mental Health Interventions   Mental Health Discussed/Reviewed Mental Health Discussed, Mental Health Reviewed, Coping Strategies, Depression  [Pt reported her MH has goten better since finding out positive news regarding her health. Pt reports difficulty accomplishing tasks and feels that she's letting herself down. CSW and pt reviewed behavioral activation and CSW will send resources in mail]        Follow up plan: Follow up call scheduled for 09/15/2023    Encounter Outcome:  Patient Visit Completed   Kenton Kingfisher, LCSW [place], [facility] Licensed Clinical Social Worker Care Coordinator [phone]

## 2023-08-25 NOTE — Patient Instructions (Signed)
Visit Information  Thank you for taking time to visit with me today. Please don't hesitate to contact me if I can be of assistance to you.   Following are the goals we discussed today:   Goals Addressed             This Visit's Progress    Reduce symptoms related to stress and depression       Activities and task to complete in order to accomplish goals.   Complete paperwork once received by mail and return to TTC office for initial intake scheduling - make part of morning routine. Call or go to Department of Social Services to inquire about food stamps and other financial support (bills) if needed Consider local food pantry sites to help supplement your food needs- [facility] (select "find help") - Find Food  [place], [place] (ghpfa.org) or download the free app to your smart phone Food and Nutrition Services - [phone] or apply online at [facility] - ePASS United Way Referral Service Dial [phone] or [phone] to speak with a call specialist.   Follow up on food resources discussed Start / continue relaxed breathing 3 times daily Attend all upcoming appointment discussed today Consider local hospice agency for free grief and loss support and counseling Utilize food resources that are available to patient. Utilize coping skills including mindfulness and doing activities to boost mood (watching favorite shows, reading) Schedule times to complete activities such as cleaning or filling out paperwork Review MH resources sent in the mail  Dedicate scheduled times to work towards goals/ re-frame goals         Our next appointment is by telephone on 09/15/2023.  Please call the care guide team at [phone] if you need to cancel or reschedule your appointment.   If you are experiencing a Mental Health or Behavioral Health Crisis or need someone to talk to, please call the Suicide and Crisis Lifeline: 988  Patient verbalizes understanding of  instructions and care plan provided today and agrees to view in MyChart. Active MyChart status and patient understanding of how to access instructions and care plan via MyChart confirmed with patient.     Telephone follow up appointment with care management team member scheduled for: 09/15/2023

## 2023-08-26 ENCOUNTER — Ambulatory Visit (HOSPITAL_COMMUNITY)
Admission: RE | Admit: 2023-08-26 | Discharge: 2023-08-26 | Disposition: A | Discharge status: Home / Self Care | Payer: Medicare Other | Source: Ambulatory Visit | Attending: Vascular Surgery | Admitting: Vascular Surgery

## 2023-08-26 ENCOUNTER — Encounter: Payer: Self-pay | Admitting: Vascular Surgery

## 2023-08-26 ENCOUNTER — Ambulatory Visit (INDEPENDENT_AMBULATORY_CARE_PROVIDER_SITE_OTHER): Payer: Medicare Other | Admitting: Vascular Surgery

## 2023-08-26 VITALS — BP 84/45 | HR 61 | Temp 97.8°F | Resp 20 | Ht 61.0 in | Wt 77.7 lb

## 2023-08-26 DIAGNOSIS — K551 Chronic vascular disorders of intestine: Secondary | ICD-10-CM

## 2023-08-26 DIAGNOSIS — I739 Peripheral vascular disease, unspecified: Secondary | ICD-10-CM

## 2023-08-30 ENCOUNTER — Encounter: Payer: Self-pay | Admitting: Family Medicine

## 2023-08-30 ENCOUNTER — Ambulatory Visit (INDEPENDENT_AMBULATORY_CARE_PROVIDER_SITE_OTHER): Payer: Medicare Other | Admitting: Family Medicine

## 2023-08-30 VITALS — BP 99/55 | HR 97 | Ht 61.0 in | Wt 80.4 lb

## 2023-08-30 DIAGNOSIS — K559 Vascular disorder of intestine, unspecified: Secondary | ICD-10-CM

## 2023-08-30 DIAGNOSIS — F17200 Nicotine dependence, unspecified, uncomplicated: Secondary | ICD-10-CM | POA: Diagnosis not present

## 2023-08-30 DIAGNOSIS — I7300 Raynaud's syndrome without gangrene: Secondary | ICD-10-CM | POA: Insufficient documentation

## 2023-08-30 NOTE — Assessment & Plan Note (Signed)
Recommend to discuss with cardiologist and vascular surgeon that she has developed pain/odd sensation in her knees ever since starting Plavix.  It is possible that this is a side effect of Plavix which does have a chance of causing myalgias/arthralgias.

## 2023-08-30 NOTE — Progress Notes (Signed)
Established Patient Office Visit  Subjective   Patient ID: Christie Moreno, female    DOB: Aug 01, 1956  Age: 68 y.o. MRN: [id number]  Chief Complaint  Patient presents with   Medical Management of Chronic Issues    HPI Christie Moreno is a 68 y.o. female presenting today for follow up of stress, smoking cessation, and to repeat liver enzymes.  At last appointment, provided information about Chantix and Wellbutrin to review.  Her stress levels are still high but have improved slightly.  She states that she knows that she should quit smoking but she is not at the point where she wants to quit smoking.  It was found that her chronic epigastric pain was actually due to chronic mesenteric ischemia rather than GERD.  She also notes that after undergoing surgery for chronic mesenteric ischemia and being put on Plavix, her "knees feel odd".  She notes that her legs feel weak and fatigued particularly in the morning.  She does not wear compression socks because she found that it cut off circulation to her feet.  Outpatient Medications Prior to Visit  Medication Sig   acetaminophen (TYLENOL) 325 MG tablet Take 2 tablets (650 mg total) by mouth every 4 (four) hours as needed for headache or mild pain (pain score 1-3).   albuterol (PROVENTIL) (2.5 MG/3ML) 0.083% nebulizer solution Take 3 mLs (2.5 mg total) by nebulization every 6 (six) hours as needed for wheezing or shortness of breath. (Patient taking differently: Take 3 mLs (2.5 mg total) by nebulization every 6 (six) hours as needed for wheezing or shortness of breath.)   aspirin 81 MG EC tablet Take 1 tablet (81 mg total) by mouth daily. Swallow whole.   atorvastatin (LIPITOR) 80 MG tablet TAKE 1 TABLET DAILY AT 6 P.M. (KEEP UPCOMING APPOINTMENT FOR FUTURE REFILLS   carvedilol (COREG) 3.125 MG tablet TAKE 1 TABLET TWICE A DAY WITH MEALS   clopidogrel (PLAVIX) 75 MG tablet Take 1 tablet (75 mg total) by mouth daily.   estrogens, conjugated,  (PREMARIN) 1.25 MG tablet Take 1.25 mg by mouth daily.   feeding supplement (ENSURE ENLIVE / ENSURE PLUS) LIQD Take 237 mLs by mouth 2 (two) times daily between meals.   furosemide (LASIX) 40 MG tablet TAKE ONE-HALF (1/2) TABLET (20 MG TOTAL) DAILY (KEEP UPCOMING APPOINTMENT FOR FUTURE REFILLS) (Patient taking differently: Take 20 mg by mouth 2 (two) times daily.)   JARDIANCE 10 MG TABS tablet TAKE 1 TABLET DAILY   lamoTRIgine (LAMICTAL) 100 MG tablet Take 1 tablet (100 mg total) by mouth 2 (two) times daily.   levothyroxine (SYNTHROID) 25 MCG tablet Take 1 tablet (25 mcg total) by mouth daily before breakfast.   losartan (COZAAR) 50 MG tablet TAKE ONE-HALF (1/2) TABLET DAILY   potassium chloride SA (KLOR-CON M) 20 MEQ tablet TAKE 1 TABLET DAILY (KEEP UPCOMING APPOINTMENT FOR FUTURE REFILLS) (Patient taking differently: Take 20 mEq by mouth daily.)   progesterone (PROMETRIUM) 200 MG capsule Take 200 mg by mouth at bedtime.   No facility-administered medications prior to visit.    ROS Negative unless otherwise noted in HPI   Objective:     BP (!) 99/55   Pulse 97   Ht 5\' 1"  (1.549 m)   Wt 80 lb 6.4 oz (36.5 kg)   SpO2 98%   BMI 15.19 kg/m   Physical Exam Constitutional:      General: She is not in acute distress.    Appearance: Normal appearance.  HENT:  Head: Normocephalic and atraumatic.  Cardiovascular:     Rate and Rhythm: Normal rate and regular rhythm.     Heart sounds: No murmur heard.    No friction rub. No gallop.  Pulmonary:     Effort: Pulmonary effort is normal. No respiratory distress.     Breath sounds: No wheezing, rhonchi or rales.  Skin:    General: Skin is warm and dry.     Coloration: Skin is cyanotic (Bilateral fingers, patient has Raynaud's.  Cyanosis improved after hand exercises to improve circulation).  Neurological:     Mental Status: She is alert and oriented to person, place, and time.      Assessment & Plan:  Tobacco  dependence Assessment & Plan: Patient knows that she should stop smoking, we discussed that it is unlikely that she will quit without finding intrinsic motivation.  Check in at 6 to 8-week mark to see if she is ready to set a quit date.  She would likely benefit from Wellbutrin or Chantix to support her smoking cessation.   Mesenteric ischemia [facility]) Assessment & Plan: Recommend to discuss with cardiologist and vascular surgeon that she has developed pain/odd sensation in her knees ever since starting Plavix.  It is possible that this is a side effect of Plavix which does have a chance of causing myalgias/arthralgias.   Raynaud's disease without gangrene Assessment & Plan: Recommend close follow-up with vascular surgeon given Raynaud's, mesenteric ischemia, and venous insufficiency of bilateral lower extremities.     Return in about 1 day (around 08/31/2023) for nonfasting blood work (recheck liver); 8 week follow up for smoking cessation.    Melida Quitter, PA

## 2023-08-30 NOTE — Assessment & Plan Note (Signed)
Patient knows that she should stop smoking, we discussed that it is unlikely that she will quit without finding intrinsic motivation.  Check in at 6 to 8-week mark to see if she is ready to set a quit date.  She would likely benefit from Wellbutrin or Chantix to support her smoking cessation.

## 2023-08-30 NOTE — Assessment & Plan Note (Signed)
Recommend close follow-up with vascular surgeon given Raynaud's, mesenteric ischemia, and venous insufficiency of bilateral lower extremities.

## 2023-08-30 NOTE — Patient Instructions (Signed)
I will look into Plavix some more to see if it is contributing to your leg symptoms!

## 2023-08-31 ENCOUNTER — Ambulatory Visit: Payer: Medicare Other | Attending: Cardiovascular Disease | Admitting: Cardiovascular Disease

## 2023-08-31 ENCOUNTER — Encounter: Payer: Self-pay | Admitting: Cardiovascular Disease

## 2023-08-31 VITALS — BP 130/68 | HR 62 | Ht 61.0 in | Wt 79.8 lb

## 2023-08-31 DIAGNOSIS — E782 Mixed hyperlipidemia: Secondary | ICD-10-CM | POA: Diagnosis present

## 2023-08-31 DIAGNOSIS — I350 Nonrheumatic aortic (valve) stenosis: Secondary | ICD-10-CM | POA: Diagnosis present

## 2023-08-31 DIAGNOSIS — I2510 Atherosclerotic heart disease of native coronary artery without angina pectoris: Secondary | ICD-10-CM | POA: Insufficient documentation

## 2023-08-31 DIAGNOSIS — I5043 Acute on chronic combined systolic (congestive) and diastolic (congestive) heart failure: Secondary | ICD-10-CM | POA: Insufficient documentation

## 2023-08-31 DIAGNOSIS — I259 Chronic ischemic heart disease, unspecified: Secondary | ICD-10-CM | POA: Diagnosis not present

## 2023-08-31 DIAGNOSIS — F17200 Nicotine dependence, unspecified, uncomplicated: Secondary | ICD-10-CM | POA: Insufficient documentation

## 2023-08-31 DIAGNOSIS — R600 Localized edema: Secondary | ICD-10-CM | POA: Diagnosis present

## 2023-08-31 DIAGNOSIS — K559 Vascular disorder of intestine, unspecified: Secondary | ICD-10-CM | POA: Insufficient documentation

## 2023-08-31 NOTE — Assessment & Plan Note (Signed)
History of mild aortic stenosis by 2D echo performed 03/15/2023 with a valve area of 1.27 cm and a peak gradient of 17 mmHg.  She also had moderate to severe aortic insufficiency.  In addition, she had mild to moderate MR.  Her EF at that time was 60 to 65% which has improved since the time of her non-STEMI on GDMT.

## 2023-08-31 NOTE — Assessment & Plan Note (Signed)
Her ejection fraction at the time of non-STEMI was 40 to 45% with grade 1 diastolic dysfunction.  She is on beta-blocker, ARB and SGLT2 inhibitor.  Her most recent echo performed 03/14/2013 revealed significant improvement in LV function now with an EF of 60 to 65%.

## 2023-08-31 NOTE — Assessment & Plan Note (Signed)
History of non-STEMI with cardiac catheterization performed by Dr. Katrinka Blazing 11/05/21 revealing nonobstructive disease with a 75% mid AV groove circumflex stenosis and a 75% ostial first diagonal branch stenosis.  She is asymptomatic.

## 2023-08-31 NOTE — Patient Instructions (Signed)
    Testing/Procedures:  Your physician has requested that you have an echocardiogram. Echocardiography is a painless test that uses sound waves to create images of your heart. It provides your doctor with information about the size and shape of your heart and how well your heart's chambers and valves are working. This procedure takes approximately one hour. There are no restrictions for this procedure. Please do NOT wear cologne, perfume, aftershave, or lotions (deodorant is allowed). Please arrive 15 minutes prior to your appointment time.  Please note: We ask at that you not bring children with you during ultrasound (echo/ vascular) testing. Due to room size and safety concerns, children are not allowed in the ultrasound rooms during exams. Our front office staff cannot provide observation of children in our lobby area while testing is being conducted. An adult accompanying a patient to their appointment will only be allowed in the ultrasound room at the discretion of the ultrasound technician under special circumstances. We apologize for any inconvenience. [address] SCHEDULE IN [place]   Follow-Up: At [facility], you and your health needs are our priority.  As part of our continuing mission to provide you with exceptional heart care, we have created designated Provider Care Teams.  These Care Teams include your primary Cardiologist (physician) and Advanced Practice Providers (APPs -  Physician Assistants and Nurse Practitioners) who all work together to provide you with the care you need, when you need it.  We recommend signing up for the patient portal called "MyChart".  Sign up information is provided on this After Visit Summary.  MyChart is used to connect with patients for Virtual Visits (Telemedicine).  Patients are able to view lab/test results, encounter notes, upcoming appointments, etc.  Non-urgent messages can be sent to your provider as well.   To learn more  about what you can do with MyChart, go to [email or web address].    Your next appointment:   6 month(s)  Provider:   ANY APP    Then, Nanetta Batty, MD will plan to see you again in 12 month(s).

## 2023-08-31 NOTE — Assessment & Plan Note (Signed)
History of mesenteric ischemia status post SMA PTA and VBX covered stenting by Dr. Hetty Blend 07/25/2023.  She is symptomatically improved.

## 2023-08-31 NOTE — Assessment & Plan Note (Signed)
Ongoing tobacco abuse of 3 cigarettes a day recalcitrant to risk factor modification.

## 2023-08-31 NOTE — Progress Notes (Signed)
08/31/2023 Christie Moreno   09/10/1955  [id number]  Primary Physician Melida Quitter, PA Primary Cardiologist: Runell Gess MD Milagros Loll, [place], [place]  HPI:  Christie Moreno is a 68 y.o.  thin-appearing single Caucasian female mother of 1 daughter, grandmother of 4 grandchildren who was hospitalized in early April with systolic heart failure.  I last saw her in the office 04/05/2023.  She did have a non-STEMI with troponins that rose to 1294.  She has a history of discontinue tobacco abuse.  Right and left heart cath revealed no significant obstructive disease by Dr. Katrinka Blazing.  Her EF was 40 to 45% with diastolic dysfunction.  She was diuresed and discharged home.  She has seen the advanced heart failure service on outpatient basis.  Her medications have been optimized.   Since I saw her 4 months ago she has undergone peripheral angiography by Dr. Hetty Blend for mesenteric ischemia 07/25/2023.  He performed intravascular lithotripsy followed by PTA and covered stenting.  Her symptoms are somewhat improved.  She still continues to smoke 3 cigarettes a day.  Her most recent 2D echo performed 03/15/2023 revealed significant improvement LV function with EF of 60 to 65%.  She had mild aortic stenosis, moderate to severe aortic insufficiency and mild to moderate MR .  She denies chest pain or shortness of breath.   Current Meds  Medication Sig   acetaminophen (TYLENOL) 325 MG tablet Take 2 tablets (650 mg total) by mouth every 4 (four) hours as needed for headache or mild pain (pain score 1-3).   albuterol (PROVENTIL) (2.5 MG/3ML) 0.083% nebulizer solution Take 3 mLs (2.5 mg total) by nebulization every 6 (six) hours as needed for wheezing or shortness of breath. (Patient taking differently: Take 3 mLs (2.5 mg total) by nebulization every 6 (six) hours as needed for wheezing or shortness of breath.)   aspirin 81 MG EC tablet Take 1 tablet (81 mg total) by mouth daily. Swallow whole.    atorvastatin (LIPITOR) 80 MG tablet TAKE 1 TABLET DAILY AT 6 P.M. (KEEP UPCOMING APPOINTMENT FOR FUTURE REFILLS   carvedilol (COREG) 3.125 MG tablet TAKE 1 TABLET TWICE A DAY WITH MEALS   clopidogrel (PLAVIX) 75 MG tablet Take 1 tablet (75 mg total) by mouth daily.   estrogens, conjugated, (PREMARIN) 1.25 MG tablet Take 1.25 mg by mouth daily.   feeding supplement (ENSURE ENLIVE / ENSURE PLUS) LIQD Take 237 mLs by mouth 2 (two) times daily between meals.   furosemide (LASIX) 40 MG tablet TAKE ONE-HALF (1/2) TABLET (20 MG TOTAL) DAILY (KEEP UPCOMING APPOINTMENT FOR FUTURE REFILLS) (Patient taking differently: Take 20 mg by mouth 2 (two) times daily.)   JARDIANCE 10 MG TABS tablet TAKE 1 TABLET DAILY   lamoTRIgine (LAMICTAL) 100 MG tablet Take 1 tablet (100 mg total) by mouth 2 (two) times daily.   levothyroxine (SYNTHROID) 25 MCG tablet Take 1 tablet (25 mcg total) by mouth daily before breakfast.   losartan (COZAAR) 50 MG tablet TAKE ONE-HALF (1/2) TABLET DAILY   potassium chloride SA (KLOR-CON M) 20 MEQ tablet TAKE 1 TABLET DAILY (KEEP UPCOMING APPOINTMENT FOR FUTURE REFILLS) (Patient taking differently: Take 20 mEq by mouth daily.)   progesterone (PROMETRIUM) 200 MG capsule Take 200 mg by mouth at bedtime.     Allergies  Allergen Reactions   Azithromycin Nausea And Vomiting   Erythromycin     Sensitivity (GI)   Penicillins Itching and Rash   Percocet [Oxycodone-Acetaminophen] Rash  Social History   Socioeconomic History   Marital status: Significant Other    Spouse name: Rexanne Mano   Number of children: 1   Years of education: Not on file   Highest education level: Bachelor's degree (e.g., BA, AB, BS)  Occupational History   Occupation: retired  Tobacco Use   Smoking status: Some Days    Current packs/day: 0.00    Average packs/day: 1 pack/day for 52.0 years (52.0 ttl pk-yrs)    Types: Cigarettes    Start date: 11/09/1969    Last attempt to quit: 11/09/2021    Years since  quitting: 1.8    Passive exposure: Current   Smokeless tobacco: Never   Tobacco comments:    08/31/2023 Patient smoke about 3 cigarettes daily    Currently 1 pack every 2-3 days    04/06/2022 patient states when she smokes its 1 cigarette    04/05/2023 Patient smokes 2 cigarettes some days   Vaping Use   Vaping status: Never Used  Substance and Sexual Activity   Alcohol use: Not Currently    Comment: socially   Drug use: No   Sexual activity: Not on file  Other Topics Concern   Not on file  Social History Narrative   Not on file   Social Drivers of Health   Financial Resource Strain: Medium Risk (05/04/2023)   Overall Financial Resource Strain (CARDIA)    Difficulty of Paying Living Expenses: Somewhat hard  Food Insecurity: No Food Insecurity (07/21/2023)   Hunger Vital Sign    Worried About Running Out of Food in the Last Year: Never true    Ran Out of Food in the Last Year: Never true  Transportation Needs: No Transportation Needs (07/21/2023)   PRAPARE - Administrator, Civil Service (Medical): No    Lack of Transportation (Non-Medical): No  Physical Activity: Inactive (03/17/2023)   Exercise Vital Sign    Days of Exercise per Week: 0 days    Minutes of Exercise per Session: 0 min  Stress: Stress Concern Present (05/04/2023)   [name] - Occupational Stress Questionnaire    Feeling of Stress : To some extent  Social Connections: Socially Isolated (03/17/2023)   Social Connection and Isolation Panel [NHANES]    Frequency of Communication with Friends and Family: More than three times a week    Frequency of Social Gatherings with Friends and Family: More than three times a week    Attends Religious Services: Never    Database administrator or Organizations: No    Attends Banker Meetings: Never    Marital Status: Widowed  Intimate Partner Violence: Not At Risk (07/21/2023)   Humiliation, Afraid, Rape, and Kick  questionnaire    Fear of Current or Ex-Partner: No    Emotionally Abused: No    Physically Abused: No    Sexually Abused: No     Review of Systems: General: negative for chills, fever, night sweats or weight changes.  Cardiovascular: negative for chest pain, dyspnea on exertion, edema, orthopnea, palpitations, paroxysmal nocturnal dyspnea or shortness of breath Dermatological: negative for rash Respiratory: negative for cough or wheezing Urologic: negative for hematuria Abdominal: negative for nausea, vomiting, diarrhea, bright red blood per rectum, melena, or hematemesis Neurologic: negative for visual changes, syncope, or dizziness All other systems reviewed and are otherwise negative except as noted above.    Blood pressure 130/68, pulse 62, height 5\' 1"  (1.549 m), weight 79 lb 12.8 oz (36.2  kg).  General appearance: alert and no distress Neck: no adenopathy, no JVD, supple, symmetrical, trachea midline, thyroid not enlarged, symmetric, no tenderness/mass/nodules, and soft bilateral carotid bruits Lungs: clear to auscultation bilaterally Heart: regular rate and rhythm, S1, S2 normal, no murmur, click, rub or gallop Extremities: 1+ bilateral lower extremity edema Pulses: 2+ and symmetric Skin: Skin color, texture, turgor normal. No rashes or lesions Neurologic: Grossly normal  EKG EKG Interpretation Date/Time:  Wednesday August 31 2023 16:15:44 EST Ventricular Rate:  62 PR Interval:  168 QRS Duration:  78 QT Interval:  434 QTC Calculation: 440 R Axis:   105  Text Interpretation: Normal sinus rhythm Rightward axis When compared with ECG of 21-Jul-2023 13:01, No significant change was found Confirmed by Nanetta Batty [phone]) on 08/31/2023 4:31:33 PM    ASSESSMENT AND PLAN:   Tobacco dependence Ongoing tobacco abuse of 3 cigarettes a day recalcitrant to risk factor modification.  Aortic stenosis History of mild aortic stenosis by 2D echo performed 03/15/2023 with a  valve area of 1.27 cm and a peak gradient of 17 mmHg.  She also had moderate to severe aortic insufficiency.  In addition, she had mild to moderate MR.  Her EF at that time was 60 to 65% which has improved since the time of her non-STEMI on GDMT.  Hyperlipidemia History of hyperlipidemia on statin therapy with lipid profile performed 06/10/2023 revealing total cholesterol 150, LDL 51 and HDL of 84.  Coronary artery disease involving native coronary artery of native heart without angina pectoris History of non-STEMI with cardiac catheterization performed by Dr. Katrinka Blazing 11/05/21 revealing nonobstructive disease with a 75% mid AV groove circumflex stenosis and a 75% ostial first diagonal branch stenosis.  She is asymptomatic.  Mesenteric ischemia (HCC) History of mesenteric ischemia status post SMA PTA and VBX covered stenting by Dr. Hetty Blend 07/25/2023.  She is symptomatically improved.  Acute on chronic heart failure with reduced ejection fraction (HFrEF, <= 40%) and combined systolic and diastolic dysfunction (HCC) Her ejection fraction at the time of non-STEMI was 40 to 45% with grade 1 diastolic dysfunction.  She is on beta-blocker, ARB and SGLT2 inhibitor.  Her most recent echo performed 03/14/2013 revealed significant improvement in LV function now with an EF of 60 to 65%.     Runell Gess MD FACP,FACC,FAHA, [facility] 08/31/2023 4:45 PM

## 2023-08-31 NOTE — Assessment & Plan Note (Signed)
History of hyperlipidemia on statin therapy with lipid profile performed 06/10/2023 revealing total cholesterol 150, LDL 51 and HDL of 84.

## 2023-09-01 ENCOUNTER — Other Ambulatory Visit: Payer: Medicare Other

## 2023-09-02 ENCOUNTER — Other Ambulatory Visit: Payer: Medicare Other

## 2023-09-02 DIAGNOSIS — R7989 Other specified abnormal findings of blood chemistry: Secondary | ICD-10-CM

## 2023-09-05 ENCOUNTER — Telehealth: Payer: Self-pay

## 2023-09-05 ENCOUNTER — Other Ambulatory Visit: Payer: Self-pay

## 2023-09-05 DIAGNOSIS — I739 Peripheral vascular disease, unspecified: Secondary | ICD-10-CM

## 2023-09-05 NOTE — Telephone Encounter (Signed)
Not on Eliquis, only Plavix. No pharm recommendations needed

## 2023-09-05 NOTE — Telephone Encounter (Signed)
Christie Moreno 68 year old female is requesting preoperative cardiac evaluation for endoscopy.  She was recently seen by you in clinic on 08/31/2023.  She was stable from a cardiac standpoint at that time.  Would you be able to comment on cardiac risk for upcoming procedure.  Thank you for your help.  Please direct your response to CV DIV preop pool.  Thomasene Ripple. Deontay Ladnier NP-C     09/05/2023, 4:47 PM [facility] [address] Office [phone] Fax [phone]

## 2023-09-05 NOTE — Telephone Encounter (Signed)
[facility] Pre-operative Risk Assessment     Request for surgical clearance:     Endoscopy Procedure  What type of surgery is being performed?     EGD  When is this surgery scheduled?     Cannot be scheduled until cleared by Cardiology  What type of clearance is required ?   Medical and Pharmacy  Are there any medications that need to be held prior to surgery and how long? Eliquis (clopidogrel) 3 days  Practice name and name of physician performing surgery?      [facility]  What is your office phone and fax number?      Phone- [phone]  Fax- [phone]  Anesthesia type (None, local, MAC, general) ?       MAC    Please route your response to [place], LPN

## 2023-09-06 ENCOUNTER — Telehealth: Payer: Self-pay | Admitting: Cardiovascular Disease

## 2023-09-06 MED ORDER — FUROSEMIDE 40 MG PO TABS: 20.0000 mg | ORAL_TABLET | Freq: Every day | ORAL | 3 refills | Status: DC

## 2023-09-06 NOTE — Telephone Encounter (Signed)
     Primary Cardiologist: Dorn Lesches, MD  Chart reviewed as part of pre-operative protocol coverage. Given past medical history and time since last visit, based on ACC/AHA guidelines, Christie Moreno would be at acceptable risk for the planned procedure without further cardiovascular testing.   Recently seen in cardiology clinic by Dr. Wadie.  She would be low risk for planned procedure.  Patient's aspirin  and Plavix  are not managed by cardiology providers.  Recommendations for holding antiplatelet therapy will need to come from prescribing provider.  I will route this recommendation to the requesting party via Epic fax function and remove from pre-op  pool.  Please call with questions.  Christie Moreno. Chet Greenley NP-C     09/06/2023, 8:14 AM [facility] [address] Office [phone] Fax [phone]

## 2023-09-06 NOTE — Telephone Encounter (Signed)
*  STAT* If patient is at the pharmacy, call can be transferred to refill team.   1. Which medications need to be refilled? (please list name of each medication and dose if known)  new prescription for Furosemide  to local pharmacy   2. Would you like to learn more about the convenience, safety, & potential cost savings by using the [facility]?   3. Are you open to using the [facility] (Type [facility].   4. Which pharmacy/location (including street and city if local pharmacy) is medication to be sent to? [facility] [address], [place]   5. Do they need a 30 day or 90 day supply? #10- please call today( out of medicine)

## 2023-09-08 ENCOUNTER — Encounter: Payer: Self-pay | Admitting: Family Medicine

## 2023-09-09 ENCOUNTER — Telehealth: Payer: Self-pay

## 2023-09-09 NOTE — Telephone Encounter (Signed)
 Pre-operative Risk Assessment     Request for surgical clearance:     Endoscopy Procedure  What type of surgery is being performed?     EGD When is this surgery scheduled?     To be decided once cleared  What type of clearance is required ?   Pharmacy  Are there any medications that need to be held prior to surgery and how long? Plavix  to be held 5 to 7 days prior to procedure date  Practice name and name of physician performing surgery?      [facility] with Dr Rosario JAYSON Kidney  What is your office phone and fax number?      Phone- [phone]  Fax- [phone]  Anesthesia type (None, local, MAC, general) ?       MAC   Please route your response to Almarie Goo, LPN

## 2023-09-09 NOTE — Telephone Encounter (Signed)
 Cardiology has cleared the patient. They do not manage her anti-coagulation. The patient thinks that will be the vascular provider. I have asked Dr Delaney Fearing, MD for clearance to hold the Plavix  5 to 7 days prior to the EGD.

## 2023-09-12 ENCOUNTER — Other Ambulatory Visit: Payer: Self-pay

## 2023-09-12 ENCOUNTER — Telehealth: Payer: Self-pay

## 2023-09-12 DIAGNOSIS — R110 Nausea: Secondary | ICD-10-CM

## 2023-09-12 DIAGNOSIS — R1013 Epigastric pain: Secondary | ICD-10-CM

## 2023-09-12 NOTE — Telephone Encounter (Signed)
 Attempted to contact the patient. Line rings, then goes silent.

## 2023-09-12 NOTE — Telephone Encounter (Signed)
 Patient is returning your call. Please advise. ?

## 2023-09-12 NOTE — Telephone Encounter (Signed)
 Cardiology has cleared the patient for an EGD in the [facility]. Vascular has cleared the patient to hold Plavix  5 to 7 days prior to EGD. Called the patient to schedule. No answer. Left her a message to call and schedule.

## 2023-09-12 NOTE — Telephone Encounter (Signed)
 Spoke with the patient. Scheduled the EGD for 10/18/23 at 9:00 am, arrive at 8:00 am.Written instructions mailed. Advised on the telephone that she will be holding the Plavix  5 days prior to her procedure date.

## 2023-09-15 ENCOUNTER — Ambulatory Visit: Payer: Self-pay | Admitting: Licensed Clinical Social Worker

## 2023-09-15 NOTE — Patient Outreach (Signed)
  Care Coordination   Follow Up Visit Note   09/15/2023 Name: Christie Moreno MRN: [id number] DOB: 1956/06/01  Christie Moreno is a 68 y.o. year old female who sees Melida Quitter, [place] for primary care. I spoke with  Christie Moreno by phone today.  What matters to the patients health and wellness today?  Being able to better manage her pain.    Goals Addressed   None     SDOH assessments and interventions completed:  Yes     Care Coordination Interventions:  Yes, provided   Follow up plan: Follow up call scheduled for 10/06/2023    Encounter Outcome:  Patient Visit Completed   Kenton Kingfisher, LCSW [facility] Licensed Clinical Social Worker Care Coordinator [phone]

## 2023-09-16 ENCOUNTER — Ambulatory Visit (HOSPITAL_COMMUNITY)
Admission: RE | Admit: 2023-09-16 | Discharge: 2023-09-16 | Disposition: A | Discharge status: Home / Self Care | Payer: Medicare Other | Source: Ambulatory Visit | Attending: Vascular Surgery | Admitting: Vascular Surgery

## 2023-09-16 ENCOUNTER — Other Ambulatory Visit: Payer: Self-pay

## 2023-09-16 DIAGNOSIS — R110 Nausea: Secondary | ICD-10-CM

## 2023-09-16 DIAGNOSIS — I739 Peripheral vascular disease, unspecified: Secondary | ICD-10-CM | POA: Diagnosis present

## 2023-09-16 DIAGNOSIS — R1013 Epigastric pain: Secondary | ICD-10-CM

## 2023-09-17 LAB — VAS US ABI WITH/WO TBI
Left ABI: 0.49
Right ABI: 1.16

## 2023-09-22 NOTE — Progress Notes (Unsigned)
Patient ID: Christie Moreno, female   DOB: [birth year], 68 y.o.   MRN: [id number]  Reason for Consult: No chief complaint on file.   Referred by Melida Quitter, PA  Subjective:     HPI  Christie Moreno is a 68 y.o. female ***  Past Medical History:  Diagnosis Date   Complex partial seizure (HCC) 04/22/2015   Convulsions (HCC) 04/19/2013   Heart failure (HCC)    Seizures (HCC)    none in years, recently had medication change   Family History  Adopted: Yes  Problem Relation Age of Onset   Breast cancer Mother    Diabetes Mother    Cancer Mother        breast   Stroke Mother    Heart Problems Father    Diabetes Sister    Kidney failure Sister    Breast cancer Maternal Aunt    Lung cancer Half-Sister    Colon cancer Neg Hx    Esophageal cancer Neg Hx    Epilepsy Neg Hx    Past Surgical History:  Procedure Laterality Date   COLONOSCOPY     PERIPHERAL INTRAVASCULAR LITHOTRIPSY  07/25/2023   Procedure: PERIPHERAL INTRAVASCULAR LITHOTRIPSY;  Surgeon: Daria Pastures, MD;  Location: [facility] INVASIVE CV LAB;  Service: Cardiovascular;;   PERIPHERAL VASCULAR INTERVENTION  07/25/2023   Procedure: PERIPHERAL VASCULAR INTERVENTION;  Surgeon: Daria Pastures, MD;  Location: [facility] INVASIVE CV LAB;  Service: Cardiovascular;;   RIGHT/LEFT HEART CATH AND CORONARY ANGIOGRAPHY N/A 11/05/2021   Procedure: RIGHT/LEFT HEART CATH AND CORONARY ANGIOGRAPHY;  Surgeon: Lyn Records, MD;  Location: [facility] INVASIVE CV LAB;  Service: Cardiovascular;  Laterality: N/A;   VISCERAL ANGIOGRAPHY N/A 07/25/2023   Procedure: VISCERAL ANGIOGRAPHY;  Surgeon: Daria Pastures, MD;  Location: [facility] INVASIVE CV LAB;  Service: Cardiovascular;  Laterality: N/A;    Short Social History:  Social History   Tobacco Use   Smoking status: Some Days    Current packs/day: 0.00    Average packs/day: 1 pack/day for 52.0 years (52.0 ttl pk-yrs)    Types: Cigarettes    Start date: 11/09/1969    Last attempt to quit:  11/09/2021    Years since quitting: 1.8    Passive exposure: Current   Smokeless tobacco: Never   Tobacco comments:    08/31/2023 Patient smoke about 3 cigarettes daily    Currently 1 pack every 2-3 days    04/06/2022 patient states when she smokes its 1 cigarette    04/05/2023 Patient smokes 2 cigarettes some days   Substance Use Topics   Alcohol use: Not Currently    Comment: socially    Allergies  Allergen Reactions   Azithromycin Nausea And Vomiting   Erythromycin     Sensitivity (GI)   Penicillins Itching and Rash   Percocet [Oxycodone-Acetaminophen] Rash    Current Outpatient Medications  Medication Sig Dispense Refill   acetaminophen (TYLENOL) 325 MG tablet Take 2 tablets (650 mg total) by mouth every 4 (four) hours as needed for headache or mild pain (pain score 1-3).     albuterol (PROVENTIL) (2.5 MG/3ML) 0.083% nebulizer solution Take 3 mLs (2.5 mg total) by nebulization every 6 (six) hours as needed for wheezing or shortness of breath. (Patient taking differently: Take 3 mLs (2.5 mg total) by nebulization every 6 (six) hours as needed for wheezing or shortness of breath.) 90 mL 12   aspirin 81 MG EC tablet Take 1 tablet (81 mg total) by mouth daily.  Swallow whole. 30 tablet 0   atorvastatin (LIPITOR) 80 MG tablet TAKE 1 TABLET DAILY AT 6 P.M. (KEEP UPCOMING APPOINTMENT FOR FUTURE REFILLS 30 tablet 11   carvedilol (COREG) 3.125 MG tablet TAKE 1 TABLET TWICE A DAY WITH MEALS 180 tablet 3   clopidogrel (PLAVIX) 75 MG tablet Take 1 tablet (75 mg total) by mouth daily. 90 tablet 0   estrogens, conjugated, (PREMARIN) 1.25 MG tablet Take 1.25 mg by mouth daily.     feeding supplement (ENSURE ENLIVE / ENSURE PLUS) LIQD Take 237 mLs by mouth 2 (two) times daily between meals. 237 mL 0   furosemide (LASIX) 40 MG tablet Take 0.5 tablets (20 mg total) by mouth daily. TAKE ONE-HALF (1/2) TABLET (20 MG TOTAL) DAILY 45 tablet 3   JARDIANCE 10 MG TABS tablet TAKE 1 TABLET DAILY 90 tablet  3   lamoTRIgine (LAMICTAL) 100 MG tablet Take 1 tablet (100 mg total) by mouth 2 (two) times daily. 60 tablet 0   levothyroxine (SYNTHROID) 25 MCG tablet Take 1 tablet (25 mcg total) by mouth daily before breakfast. 90 tablet 3   losartan (COZAAR) 50 MG tablet TAKE ONE-HALF (1/2) TABLET DAILY 90 tablet 3   potassium chloride SA (KLOR-CON M) 20 MEQ tablet TAKE 1 TABLET DAILY (KEEP UPCOMING APPOINTMENT FOR FUTURE REFILLS) (Patient taking differently: Take 20 mEq by mouth daily.) 90 tablet 3   progesterone (PROMETRIUM) 200 MG capsule Take 200 mg by mouth at bedtime.     No current facility-administered medications for this visit.    REVIEW OF SYSTEMS  Positive for ***  All other systems were reviewed and are negative     Objective:  Objective   There were no vitals filed for this visit. There is no height or weight on file to calculate BMI.  Physical Exam General: no acute distress Cardiac: hemodynamically stable Pulm: normal work of breathing Abdomen: non-tender, no pulsatile mass*** Neuro: alert, no focal deficit Extremities: no edema, cyanosis or wounds*** Vascular:   Right: ***  Left: ***   Data: +---------+------------------+-----+-----------+--------+  Right   Rt Pressure (mmHg)IndexWaveform   Comment   +---------+------------------+-----+-----------+--------+  Brachial 138                                         +---------+------------------+-----+-----------+--------+  PTA     148               1.07 multiphasic          +---------+------------------+-----+-----------+--------+  DP      160               1.16 multiphasic          +---------+------------------+-----+-----------+--------+  Great Toe0                 0.00 Absent               +---------+------------------+-----+-----------+--------+   +---------+------------------+-----+----------+-------+  Left    Lt Pressure (mmHg)IndexWaveform  Comment   +---------+------------------+-----+----------+-------+  Brachial 132                                       +---------+------------------+-----+----------+-------+  PTA     66                0.48 monophasic         +---------+------------------+-----+----------+-------+  DP      67                0.49 monophasic         +---------+------------------+-----+----------+-------+  Great Toe0                 0.00 Absent             +---------+------------------+-----+----------+-------+   CMP reviewed, creatinine 0.9  CT abdomen pelvis reviewed CT of left common external and internal iliacs with reconstitution of the left common femoral with calcific stenosis approximately 50%.     Assessment/Plan:     Christie Moreno is a 68 y.o. female ***    Recommendations to optimize cardiovascular risk: Abstinence from all tobacco products. Blood glucose control with goal A1c < 7%. Blood pressure control with goal blood pressure < 140/90 mmHg. Lipid reduction therapy with goal LDL-C <100 mg/dL  Aspirin 81mg  PO QD.  Atorvastatin 40-80mg  PO QD (or other "high intensity" statin therapy).     Daria Pastures MD [facility]

## 2023-09-23 ENCOUNTER — Ambulatory Visit: Payer: Medicare Other | Admitting: Vascular Surgery

## 2023-09-23 ENCOUNTER — Encounter: Payer: Self-pay | Admitting: Vascular Surgery

## 2023-09-23 VITALS — BP 119/52 | HR 65 | Temp 97.9°F | Resp 16 | Ht 61.0 in | Wt 79.5 lb

## 2023-09-23 DIAGNOSIS — I70222 Atherosclerosis of native arteries of extremities with rest pain, left leg: Secondary | ICD-10-CM

## 2023-09-26 NOTE — Telephone Encounter (Signed)
 I have spoken to patient to advise that per Dr Hetty Blend, she may hold plavix 5 days prior to her upcoming procedure. Patient verbalizes understanding and states that she already has this written down.

## 2023-09-29 ENCOUNTER — Ambulatory Visit: Payer: Self-pay

## 2023-09-29 NOTE — Patient Outreach (Signed)
 Care Coordination   Initial Visit Note   09/29/2023 Name: Christie Moreno MRN: [id number] DOB: 05-04-1956  Christie Moreno is a 68 y.o. year old female who sees Christie Moreno, [place] for primary care. I spoke with  Christie Moreno by phone today.  What matters to the patients health and wellness today?  Christie Moreno resides with her fianc and has recently discussed her issues related to congestive heart failure. We reviewed her medical records during our meeting, including her allergies and medications. She expressed a desire to cease smoking, although she is reluctant to take additional medication. I recommended using an inhaler, which she indicated she would consider.   Moreover, Christie Moreno conveyed her intention to increase her physical activity; however, she faces difficulties due to plantar warts and swelling in her feet. I will provide her with educational materials regarding the management of congestive heart failure and anxiety, as well as techniques for relaxation, as we also discussed on the phone. Additionally, I will facilitate a connection with the pharmacy at her facility to discuss options for smoking cessation utilizing an inhaler.     Goals Addressed             This Visit's Progress    Patient Stated-       Patient Stated- She want to reducess anxiety and manage and monitor CHF       Patient Goals/Self Care Activities: -Patient/Caregiver will take medications as prescribed   -Patient/Caregiver will attend all scheduled provider appointments -Patient/Caregiver will call pharmacy for medication refills 3-7 days in advance of running out of medications -Patient/Caregiver will call provider office for new concerns or questions  -Patient/Caregiver will focus on medication adherence by taking medications as prescribed  -Weigh daily and record (notify MD with 3 lb weight gain over night or 5 lb in a week) -Follow CHF Action Plan -Adhere to low sodium diet    -Getting enough sleep can help reduce anxiety.  -Eating a healthy diet can help reduce anxiety -Breathing exercises: Try deep breathing from your abdomen. -Meditation: Meditation can help reduce anxiety. -Visualization: Visualization techniques can help ease anxiety.         SDOH assessments and interventions completed:  Yes  SDOH Interventions Today    Flowsheet Row Most Recent Value  SDOH Interventions   Food Insecurity Interventions Intervention Not Indicated  Housing Interventions Intervention Not Indicated  Transportation Interventions Intervention Not Indicated        Care Coordination Interventions:  Yes, provided   Interventions Today    Flowsheet Row Most Recent Value  Chronic Disease   Chronic disease during today's visit Congestive Heart Failure (CHF), Other  [Anxiety]  General Interventions   General Interventions Discussed/Reviewed General Interventions Discussed, General Interventions Reviewed  [CHF action plan]  Exercise Interventions   Exercise Discussed/Reviewed Exercise Discussed  Education Interventions   Education Provided Provided Education  Provided Verbal Education On Nutrition, Exercise  [inhalers]  Nutrition Interventions   Nutrition Discussed/Reviewed Supplemental nutrition, Decreasing salt  Pharmacy Interventions   Pharmacy Dicussed/Reviewed Pharmacy Topics Discussed  Safety Interventions   Safety Discussed/Reviewed Safety Discussed        Follow up plan: Follow up call scheduled for 10/13/23   11 am     Encounter Outcome:  Patient Visit Completed   Juanell Fairly RN, BSN, [facility]  [facility], [facility]  Care Coordinator Phone: [phone]

## 2023-09-29 NOTE — Patient Instructions (Signed)
 Visit Information  Thank you for taking time to visit with me today. Please don't hesitate to contact me if I can be of assistance to you.   Following are the goals we discussed today:   Goals Addressed             This Visit's Progress    Patient Stated-       Patient Stated- She want to reducess anxiety and manage and monitor CHF       Patient Goals/Self Care Activities: -Patient/Caregiver will take medications as prescribed   -Patient/Caregiver will attend all scheduled provider appointments -Patient/Caregiver will call pharmacy for medication refills 3-7 days in advance of running out of medications -Patient/Caregiver will call provider office for new concerns or questions  -Patient/Caregiver will focus on medication adherence by taking medications as prescribed  -Weigh daily and record (notify MD with 3 lb weight gain over night or 5 lb in a week) -Follow CHF Action Plan -Adhere to low sodium diet   -Getting enough sleep can help reduce anxiety.  -Eating a healthy diet can help reduce anxiety -Breathing exercises: Try deep breathing from your abdomen. -Meditation: Meditation can help reduce anxiety. -Visualization: Visualization techniques can help ease anxiety.         Our next appointment is by telephone on 10/13/23 at 11 am  Please call the care guide team at [phone] if you need to cancel or reschedule your appointment.   If you are experiencing a Mental Health or Behavioral Health Crisis or need someone to talk to, please call 1-800-273-TALK (toll free, 24 hour hotline)  Patient verbalizes understanding of instructions and care plan provided today and agrees to view in MyChart. Active MyChart status and patient understanding of how to access instructions and care plan via MyChart confirmed with patient.     Juanell Fairly RN, BSN, [facility]  [facility], [facility]  Care Coordinator Phone: [phone]

## 2023-10-06 ENCOUNTER — Ambulatory Visit: Payer: Self-pay | Admitting: Licensed Clinical Social Worker

## 2023-10-06 NOTE — Patient Instructions (Signed)
 Visit Information  Thank you for taking time to visit with me today. Please don't hesitate to contact me if I can be of assistance to you.   Following are the goals we discussed today:   Goals Addressed             This Visit's Progress    COMPLETED: Reduce symptoms related to stress and depression       Activities and task to complete in order to accomplish goals.   Complete paperwork once received by mail and return to TTC office for initial intake scheduling - make part of morning routine. Call or go to Department of Social Services to inquire about food stamps and other financial support (bills) if needed Consider local food pantry sites to help supplement your food needs- [facility] (select "find help") - Find Food  [place], [place] (ghpfa.org) or download the free app to your smart phone Food and Nutrition Services - (236) 847-8018 or apply online at [facility] - ePASS United Way Referral Service Dial ([phone]) 521-0250 or [phone] to speak with a call specialist.   Follow up on food resources discussed Start / continue relaxed breathing 3 times daily Attend all upcoming appointment discussed today Consider local hospice agency for free grief and loss support and counseling Utilize food resources that are available to patient. Utilize coping skills including mindfulness and doing activities to boost mood (watching favorite shows, reading) Schedule times to complete activities such as cleaning or filling out paperwork Review MH resources sent in the mail  Dedicate scheduled times to work towards goals/ re-frame goals         Please call the care guide team at [phone] if you need to cancel or reschedule your appointment.   If you are experiencing a Mental Health or Behavioral Health Crisis or need someone to talk to, please call the Suicide and Crisis Lifeline: 988  Patient verbalizes understanding of instructions and care plan provided today  and agrees to view in MyChart. Active MyChart status and patient understanding of how to access instructions and care plan via MyChart confirmed with patient.     No further follow up required: Please contact provider if you would like to reconnect

## 2023-10-06 NOTE — Patient Outreach (Signed)
 Care Coordination   Follow Up Visit Note   10/06/2023 Name: MARKIE HEFFERNAN MRN: [id number] DOB: 1955-11-03  [name] is a 68 y.o. year old female who sees Melida Quitter, [place] for primary care. I spoke with  [name] by phone today.  What matters to the patients health and wellness today?  Better managing symptoms of anxiety and depression.    Goals Addressed             This Visit's Progress    COMPLETED: Reduce symptoms related to stress and depression       Activities and task to complete in order to accomplish goals.   Complete paperwork once received by mail and return to TTC office for initial intake scheduling - make part of morning routine. Call or go to Department of Social Services to inquire about food stamps and other financial support (bills) if needed Consider local food pantry sites to help supplement your food needs- [facility] (select "find help") - Find Food  [place], [place] (ghpfa.org) or download the free app to your smart phone Food and Nutrition Services - 3612087721 or apply online at [facility] - ePASS United Way Referral Service Dial (475)749-8323 or [phone] to speak with a call specialist.   Follow up on food resources discussed Start / continue relaxed breathing 3 times daily Attend all upcoming appointment discussed today Consider local hospice agency for free grief and loss support and counseling Utilize food resources that are available to patient. Utilize coping skills including mindfulness and doing activities to boost mood (watching favorite shows, reading) Schedule times to complete activities such as cleaning or filling out paperwork Review MH resources sent in the mail  Dedicate scheduled times to work towards goals/ re-frame goals         SDOH assessments and interventions completed:  Yes     Care Coordination Interventions:  Yes, provided   Interventions Today    Flowsheet  Row Most Recent Value  Chronic Disease   Chronic disease during today's visit Other  [Depression/Anxiety]  Education Interventions   Education Provided Provided Education  [We reviewed pt's coping skills and monitoring her mood for symptoms of anxiety/depression.]  Mental Health Interventions   Mental Health Discussed/Reviewed Mental Health Discussed, Mental Health Reviewed, Coping Strategies, Anxiety  [Pt reported that she is not interested in starting therapy at this time with upcoming appt. We reviewed process making therapy appt and CSW gave information for MH resources if her situation changes. Pt reported doing better with warmer weather.]        Follow up plan: No further intervention required.   Encounter Outcome:  Patient Visit Completed   Kenton Kingfisher, LCSW [place], [facility] Licensed Clinical Social Worker Care Coordinator [phone]

## 2023-10-13 ENCOUNTER — Ambulatory Visit: Payer: Self-pay

## 2023-10-13 NOTE — Patient Outreach (Signed)
 Care Coordination   Follow Up Visit Note   10/13/2023 Name: Christie Moreno MRN: [id number] DOB: 1955-09-15  Christie Moreno is a 68 y.o. year old female who sees Christie Moreno, [place] for primary care. I spoke with  Christie Moreno by phone today.  What matters to the patients health and wellness today?  I spoke with Christie Moreno, who stated that she has no problems with chest pain or shortness of breath but a slight swelling in both feet. She elevates them, and she takes her medication that she feels helps the furosemide 40 milligrams; she takes half a pill twice a day. The medication is for fluid retention. I sent a note to her PCP because she wants to stop smoking, but she wants to use an inhaler instead of medication and have it sent to the [facility] pharmacy.     Goals Addressed             This Visit's Progress    Patient Stated- She want to reducess anxiety and manage and monitor CHF       Patient Goals/Self Care Activities: -Patient/Caregiver will take medications as prescribed   -Patient/Caregiver will attend all scheduled provider appointments -Patient/Caregiver will call pharmacy for medication refills 3-7 days in advance of running out of medications -Patient/Caregiver will call provider office for new concerns or questions  -Patient/Caregiver will focus on medication adherence by taking medications as prescribed  -Weigh daily and record (notify MD with 3 lb weight gain over night or 5 lb in a week) -Follow CHF Action Plan -Adhere to low sodium diet   -Getting enough sleep can help reduce anxiety.  -Eating a healthy diet can help reduce anxiety -Breathing exercises: Try deep breathing from your abdomen. -Meditation: Meditation can help reduce anxiety. -Visualization: Visualization techniques can help ease anxiety. -Continue to weigh , take your fluid pills and elevate your feet.         SDOH assessments and interventions completed:  No      Care Coordination Interventions:  Yes, provided   Interventions Today    Flowsheet Row Most Recent Value  Chronic Disease   Chronic disease during today's visit Congestive Heart Failure (CHF)  General Interventions   General Interventions Discussed/Reviewed General Interventions Discussed  Exercise Interventions   Exercise Discussed/Reviewed Exercise Discussed  Education Interventions   Education Provided Provided Education  Provided Verbal Education On Exercise  Pharmacy Interventions   Pharmacy Dicussed/Reviewed Pharmacy Topics Discussed  Safety Interventions   Safety Discussed/Reviewed Safety Discussed        Follow up plan: Follow up call scheduled for 11/16/23  11 am    Encounter Outcome:  Patient Visit Completed   Juanell Fairly RN, BSN, [facility]  [facility], [facility]  Care Coordinator Phone: [phone]

## 2023-10-13 NOTE — Patient Instructions (Signed)
 Visit Information  Thank you for taking time to visit with me today. Please don't hesitate to contact me if I can be of assistance to you.   Following are the goals we discussed today:   Goals Addressed             This Visit's Progress    Patient Stated- She want to reducess anxiety and manage and monitor CHF       Patient Goals/Self Care Activities: -Patient/Caregiver will take medications as prescribed   -Patient/Caregiver will attend all scheduled provider appointments -Patient/Caregiver will call pharmacy for medication refills 3-7 days in advance of running out of medications -Patient/Caregiver will call provider office for new concerns or questions  -Patient/Caregiver will focus on medication adherence by taking medications as prescribed  -Weigh daily and record (notify MD with 3 lb weight gain over night or 5 lb in a week) -Follow CHF Action Plan -Adhere to low sodium diet   -Getting enough sleep can help reduce anxiety.  -Eating a healthy diet can help reduce anxiety -Breathing exercises: Try deep breathing from your abdomen. -Meditation: Meditation can help reduce anxiety. -Visualization: Visualization techniques can help ease anxiety. -Continue to weigh , take your fluid pills and elevate your feet.         Our next appointment is by telephone on 11/16/23 at 11 am  Please call the care guide team at [phone] if you need to cancel or reschedule your appointment.   If you are experiencing a Mental Health or Behavioral Health Crisis or need someone to talk to, please call 1-800-273-TALK (toll free, 24 hour hotline)  Patient verbalizes understanding of instructions and care plan provided today and agrees to view in MyChart. Active MyChart status and patient understanding of how to access instructions and care plan via MyChart confirmed with patient.     Juanell Fairly RN, BSN, [facility]  [facility], [facility]         Care  Coordinator Phone: [phone]

## 2023-10-14 ENCOUNTER — Other Ambulatory Visit: Payer: Self-pay | Admitting: Physician Assistant

## 2023-10-14 ENCOUNTER — Other Ambulatory Visit: Payer: Self-pay | Admitting: Family Medicine

## 2023-10-14 MED ORDER — NICOTINE 10 MG IN INHA
1.0000 | RESPIRATORY_TRACT | 0 refills | Status: DC | PRN
Start: 2023-10-14 — End: 2023-11-09

## 2023-10-18 ENCOUNTER — Encounter: Payer: Medicare Other | Admitting: Internal Medicine

## 2023-10-18 ENCOUNTER — Telehealth: Payer: Self-pay | Admitting: Gastroenterology

## 2023-10-18 NOTE — Telephone Encounter (Signed)
 Pt was contacted in regard to messages below. Pt stated that she had to cancel her EGD due to fever and congestion. Pt was notified to resume Plavix today as she was previous taking.  Pt was rescheduled for 11/23/2023 at 9:00. Pt made aware. Prep instructions were created and sent to pt via mail. Pt made aware  Pt was notified to hold the plavix 5 days prior to procedure.  Pt verbalized understanding with all questions answered.

## 2023-10-18 NOTE — Telephone Encounter (Signed)
 Patient called after hours reporting fever, congestion and cough.  Recent sick household contact.  Canceled today's EGD.  - H. Myrtie Neither, MD

## 2023-10-18 NOTE — Telephone Encounter (Signed)
 Spoke to patient's and she is requesting a call back from a nurse to get more information on her plavix. Please call to advise.

## 2023-10-24 ENCOUNTER — Ambulatory Visit: Admission: EM | Admit: 2023-10-24 | Discharge: 2023-10-24 | Disposition: A | Discharge status: Home / Self Care

## 2023-10-24 ENCOUNTER — Other Ambulatory Visit: Payer: Self-pay

## 2023-10-24 ENCOUNTER — Emergency Department (HOSPITAL_COMMUNITY)
Admission: EM | Admit: 2023-10-24 | Discharge: 2023-10-25 | Disposition: A | Discharge status: Home / Self Care | Source: Home / Self Care | Attending: Emergency Medicine | Admitting: Emergency Medicine

## 2023-10-24 ENCOUNTER — Emergency Department (HOSPITAL_COMMUNITY)

## 2023-10-24 ENCOUNTER — Encounter (HOSPITAL_COMMUNITY): Payer: Self-pay

## 2023-10-24 ENCOUNTER — Encounter: Payer: Self-pay | Admitting: Emergency Medicine

## 2023-10-24 DIAGNOSIS — J441 Chronic obstructive pulmonary disease with (acute) exacerbation: Secondary | ICD-10-CM | POA: Insufficient documentation

## 2023-10-24 DIAGNOSIS — Z7982 Long term (current) use of aspirin: Secondary | ICD-10-CM | POA: Insufficient documentation

## 2023-10-24 DIAGNOSIS — I509 Heart failure, unspecified: Secondary | ICD-10-CM | POA: Insufficient documentation

## 2023-10-24 DIAGNOSIS — Z7902 Long term (current) use of antithrombotics/antiplatelets: Secondary | ICD-10-CM | POA: Insufficient documentation

## 2023-10-24 DIAGNOSIS — R0602 Shortness of breath: Secondary | ICD-10-CM | POA: Diagnosis not present

## 2023-10-24 DIAGNOSIS — U071 COVID-19: Secondary | ICD-10-CM | POA: Insufficient documentation

## 2023-10-24 LAB — CBC
HCT: 36.6 % (ref 36.0–46.0)
Hemoglobin: 11.9 g/dL — ABNORMAL LOW (ref 12.0–15.0)
MCH: 26.6 pg (ref 26.0–34.0)
MCHC: 32.5 g/dL (ref 30.0–36.0)
MCV: 81.9 fL (ref 80.0–100.0)
Platelets: 152 10*3/uL (ref 150–400)
RBC: 4.47 MIL/uL (ref 3.87–5.11)
RDW: 15 % (ref 11.5–15.5)
WBC: 6.6 10*3/uL (ref 4.0–10.5)
nRBC: 0.3 % — ABNORMAL HIGH (ref 0.0–0.2)

## 2023-10-24 LAB — COMPREHENSIVE METABOLIC PANEL
ALT: 20 U/L (ref 0–44)
AST: 31 U/L (ref 15–41)
Albumin: 3.2 g/dL — ABNORMAL LOW (ref 3.5–5.0)
Alkaline Phosphatase: 56 U/L (ref 38–126)
Anion gap: 15 (ref 5–15)
BUN: 16 mg/dL (ref 8–23)
CO2: 23 mmol/L (ref 22–32)
Calcium: 9 mg/dL (ref 8.9–10.3)
Chloride: 94 mmol/L — ABNORMAL LOW (ref 98–111)
Creatinine, Ser: 1.08 mg/dL — ABNORMAL HIGH (ref 0.44–1.00)
GFR, Estimated: 56 mL/min — ABNORMAL LOW (ref >60)
Glucose, Bld: 108 mg/dL — ABNORMAL HIGH (ref 70–99)
Potassium: 3 mmol/L — ABNORMAL LOW (ref 3.5–5.1)
Sodium: 132 mmol/L — ABNORMAL LOW (ref 135–145)
Total Bilirubin: 0.3 mg/dL (ref 0.0–1.2)
Total Protein: 6.5 g/dL (ref 6.5–8.1)

## 2023-10-24 MED ORDER — IPRATROPIUM-ALBUTEROL 0.5-2.5 (3) MG/3ML IN SOLN
3.0000 mL | Freq: Once | RESPIRATORY_TRACT | Status: AC
  Administered 2023-10-24: 3 mL via RESPIRATORY_TRACT
  Filled 2023-10-24: qty 3

## 2023-10-24 MED ORDER — DEXAMETHASONE SODIUM PHOSPHATE 10 MG/ML IJ SOLN
10.0000 mg | Freq: Once | INTRAMUSCULAR | Status: AC
  Administered 2023-10-24: 10 mg via INTRAVENOUS
  Filled 2023-10-24: qty 1

## 2023-10-24 NOTE — ED Triage Notes (Signed)
 Pt c/o SOBx3d. Pt c/o productive cough w/brown [id number].

## 2023-10-24 NOTE — ED Provider Notes (Signed)
 [facility]    CSN: [id number] Arrival date & time: 10/24/23  1634      History   Chief Complaint Chief Complaint  Patient presents with   Shortness of Breath    HPI Christie Moreno is a 68 y.o. female.   Patient here today for evaluation of shortness of breath that started about a week and a half ago and is not improving.  She has significant past medical history including heart failure, COPD, and failure to thrive.  The history is provided by the patient.  Shortness of Breath Associated symptoms: cough and wheezing   Associated symptoms: no abdominal pain, no fever and no vomiting     Past Medical History:  Diagnosis Date   Complex partial seizure (HCC) 04/22/2015   Convulsions (HCC) 04/19/2013   Heart failure (HCC)    Seizures (HCC)    none in years, recently had medication change    Patient Active Problem List   Diagnosis Date Noted   Acute on chronic heart failure with reduced ejection fraction (HFrEF, <= 40%) and combined systolic and diastolic dysfunction (HCC) 08/31/2023   Raynaud disease 08/30/2023   Hypokalemia 07/23/2023   Pressure injury of skin 07/22/2023   Hyponatremia 07/22/2023   FTT (failure to thrive) in adult 07/21/2023   Mesenteric ischemia (HCC) 07/21/2023   Hypothyroidism 07/04/2022   Estrogen deficiency 07/04/2022   Coronary artery disease involving native coronary artery of native heart without angina pectoris 07/04/2022   Aortic stenosis 04/06/2022   Hyperlipidemia 04/06/2022   Protein-calorie malnutrition, severe 11/06/2021   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   Tobacco dependence 11/02/2021   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   Anxiety 08/10/2021   Partial symptomatic epilepsy with complex partial seizures, not intractable, without status epilepticus (HCC) 04/23/2019    Past Surgical History:  Procedure Laterality Date   COLONOSCOPY     PERIPHERAL INTRAVASCULAR LITHOTRIPSY  07/25/2023    Procedure: PERIPHERAL INTRAVASCULAR LITHOTRIPSY;  Surgeon: Daria Pastures, MD;  Location: [facility] INVASIVE CV LAB;  Service: Cardiovascular;;   PERIPHERAL VASCULAR INTERVENTION  07/25/2023   Procedure: PERIPHERAL VASCULAR INTERVENTION;  Surgeon: Daria Pastures, MD;  Location: [facility] INVASIVE CV LAB;  Service: Cardiovascular;;   RIGHT/LEFT HEART CATH AND CORONARY ANGIOGRAPHY N/A 11/05/2021   Procedure: RIGHT/LEFT HEART CATH AND CORONARY ANGIOGRAPHY;  Surgeon: Lyn Records, MD;  Location: [facility] INVASIVE CV LAB;  Service: Cardiovascular;  Laterality: N/A;   VISCERAL ANGIOGRAPHY N/A 07/25/2023   Procedure: VISCERAL ANGIOGRAPHY;  Surgeon: Daria Pastures, MD;  Location: [facility] INVASIVE CV LAB;  Service: Cardiovascular;  Laterality: N/A;    OB History   No obstetric history on file.      Home Medications    Prior to Admission medications   Medication Sig Start Date End Date Taking? Authorizing Provider  acetaminophen (TYLENOL) 325 MG tablet Take 2 tablets (650 mg total) by mouth every 4 (four) hours as needed for headache or mild pain (pain score 1-3). Patient not taking: Reported on 09/29/2023 07/26/23   Marlin Canary U, DO  albuterol (PROVENTIL) (2.5 MG/3ML) 0.083% nebulizer solution Take 3 mLs (2.5 mg total) by nebulization every 6 (six) hours as needed for wheezing or shortness of breath. Patient taking differently: Take 3 mLs (2.5 mg total) by nebulization every 6 (six) hours as needed for wheezing or shortness of breath. 11/06/21   Arrien, York Ram, MD  aspirin 81 MG EC tablet Take 1 tablet (81 mg total) by mouth daily. Swallow whole. 11/06/21  Arrien, York Ram, MD  atorvastatin (LIPITOR) 80 MG tablet TAKE 1 TABLET DAILY AT 6 P.M. (KEEP UPCOMING APPOINTMENT FOR FUTURE REFILLS 05/11/23   Runell Gess, MD  carvedilol (COREG) 3.125 MG tablet TAKE 1 TABLET(3.125 MG) BY MOUTH TWICE DAILY WITH A MEAL 10/14/23   Runell Gess, MD  clopidogrel (PLAVIX) 75 MG tablet Take 1 tablet (75 mg  total) by mouth daily. 07/27/23   Joseph Art, DO  estrogens, conjugated, (PREMARIN) 1.25 MG tablet Take 1.25 mg by mouth daily.    [provider]  feeding supplement (ENSURE ENLIVE / ENSURE PLUS) LIQD Take 237 mLs by mouth 2 (two) times daily between meals. 11/06/21   Arrien, York Ram, MD  furosemide (LASIX) 40 MG tablet Take 0.5 tablets (20 mg total) by mouth daily. TAKE ONE-HALF (1/2) TABLET (20 MG TOTAL) DAILY 09/06/23   Runell Gess, MD  JARDIANCE 10 MG TABS tablet TAKE 1 TABLET DAILY 01/07/23   Runell Gess, MD  lamoTRIgine (LAMICTAL) 100 MG tablet Take 1 tablet (100 mg total) by mouth 2 (two) times daily. 08/22/23   Glean Salvo, NP  levothyroxine (SYNTHROID) 25 MCG tablet Take 1 tablet (25 mcg total) by mouth daily before breakfast. 06/24/23   Melida Quitter, PA  losartan (COZAAR) 50 MG tablet TAKE ONE-HALF (1/2) TABLET DAILY 01/24/23   Runell Gess, MD  nicotine (NICOTROL) 10 MG inhaler Inhale 1 Cartridge (1 continuous puffing total) into the lungs as needed for smoking cessation. 10/14/23   Sandre Kitty, MD  potassium chloride SA (KLOR-CON M) 20 MEQ tablet TAKE 1 TABLET DAILY (KEEP UPCOMING APPOINTMENT FOR FUTURE REFILLS) Patient taking differently: Take 20 mEq by mouth daily. 02/28/23   Corrin Parker, PA-C  progesterone (PROMETRIUM) 200 MG capsule Take 200 mg by mouth at bedtime. 04/20/22   [provider]    Family History Family History  Adopted: Yes  Problem Relation Age of Onset   Breast cancer Mother    Diabetes Mother    Cancer Mother        breast   Stroke Mother    Heart Problems Father    Diabetes Sister    Kidney failure Sister    Breast cancer Maternal Aunt    Lung cancer Half-Sister    Colon cancer Neg Hx    Esophageal cancer Neg Hx    Epilepsy Neg Hx     Social History Social History   Tobacco Use   Smoking status: Some Days    Current packs/day: 0.00    Average packs/day: 1 pack/day for 52.0 years (52.0 ttl  pk-yrs)    Types: Cigarettes    Start date: 11/09/1969    Last attempt to quit: 11/09/2021    Years since quitting: 1.9    Passive exposure: Current   Smokeless tobacco: Never   Tobacco comments:    08/31/2023 Patient smoke about 3 cigarettes daily    Currently 1 pack every 2-3 days    04/06/2022 patient states when she smokes its 1 cigarette    04/05/2023 Patient smokes 2 cigarettes some days   Vaping Use   Vaping status: Never Used  Substance Use Topics   Alcohol use: Not Currently    Comment: socially   Drug use: No     Allergies   Azithromycin, Erythromycin, Penicillins, and Percocet [oxycodone-acetaminophen]   Review of Systems Review of Systems  Constitutional:  Negative for chills and fever.  Eyes:  Negative for discharge and redness.  Respiratory:  Positive for cough, shortness of breath and wheezing.   Gastrointestinal:  Negative for abdominal pain, nausea and vomiting.  Genitourinary:  Positive for vaginal bleeding and vaginal discharge.     Physical Exam Triage Vital Signs ED Triage Vitals  Encounter Vitals Group     BP 10/24/23 1636 120/77     Systolic BP Percentile --      Diastolic BP Percentile --      Pulse Rate 10/24/23 1640 85     Resp --      Temp 10/24/23 1636 98 F (36.7 C)     Temp Source 10/24/23 1636 Oral     SpO2 10/24/23 1640 94 %     Weight --      Height --      Head Circumference --      Peak Flow --      Pain Score --      Pain Loc --      Pain Education --      Exclude from Growth Chart --    No data found.  Updated Vital Signs BP 120/77 (BP Location: Right Arm)   Pulse 85   Temp 98 F (36.7 C) (Oral)   SpO2 94%   Visual Acuity Right Eye Distance:   Left Eye Distance:   Bilateral Distance:    Right Eye Near:   Left Eye Near:    Bilateral Near:     Physical Exam Vitals and nursing note reviewed.  Constitutional:      General: She is not in acute distress.    Appearance: She is not ill-appearing.     Comments:  Appears frail  HENT:     Head: Normocephalic and atraumatic.  Eyes:     Conjunctiva/sclera: Conjunctivae normal.  Cardiovascular:     Rate and Rhythm: Normal rate.  Pulmonary:     Comments: Patient unable to speak in full sentences due to shortness of breath Neurological:     Mental Status: She is alert.  Psychiatric:        Mood and Affect: Mood normal.        Behavior: Behavior normal.        Thought Content: Thought content normal.      UC Treatments / Results  Labs (all labs ordered are listed, but only abnormal results are displayed) Labs Reviewed - No data to display  EKG   Radiology No results found.  Procedures Procedures (including critical care time)  Medications Ordered in UC Medications - No data to display  Initial Impression / Assessment and Plan / UC Course  I have reviewed the triage vital signs and the nursing notes.  Pertinent labs & imaging results that were available during my care of the patient were reviewed by me and considered in my medical decision making (see chart for details).    Given shortness of breath with significant past medical history including congestive heart failure recommended further evaluation in the emergency room for stat imaging as well as labs to determine underlying etiology of symptoms.  Patient is agreeable to same and has someone with her who can transport her via POV.  Final Clinical Impressions(s) / UC Diagnoses   Final diagnoses:  Shortness of breath   Discharge Instructions   None    ED Prescriptions   None    PDMP not reviewed this encounter.   Tomi Bamberger, PA-C 10/24/23 1704

## 2023-10-24 NOTE — ED Triage Notes (Addendum)
 Pt presents for SOB, cough,fatigue for 1.5weeks..  She used nebulizer treatment before coming into UC

## 2023-10-24 NOTE — ED Provider Notes (Signed)
 [place] EMERGENCY DEPARTMENT AT [facility] Provider Note   CSN: [id number] Arrival date & time: 10/24/23  1718     History {Add pertinent medical, surgical, social history, OB history to HPI:1} Chief Complaint  Patient presents with   Shortness of Breath    Christie Moreno is a 68 y.o. female.  HPI     This is a 68 year old female with a history of COPD and CHF who presents with shortness of breath, cough.  Patient reports that she has had 1 week history of upper respiratory symptoms and cold-like symptoms.  She has developed a cough that has been productive of brown mucus.  Tmax 99.  No known sick contacts.  She states that she had to use her inhaler today which she is not sure helped her.  Denies chest pain or abdominal pain.  Home Medications Prior to Admission medications   Medication Sig Start Date End Date Taking? Authorizing Provider  acetaminophen (TYLENOL) 325 MG tablet Take 2 tablets (650 mg total) by mouth every 4 (four) hours as needed for headache or mild pain (pain score 1-3). Patient not taking: Reported on 09/29/2023 07/26/23   Marlin Canary U, DO  albuterol (PROVENTIL) (2.5 MG/3ML) 0.083% nebulizer solution Take 3 mLs (2.5 mg total) by nebulization every 6 (six) hours as needed for wheezing or shortness of breath. Patient taking differently: Take 3 mLs (2.5 mg total) by nebulization every 6 (six) hours as needed for wheezing or shortness of breath. 11/06/21   Arrien, York Ram, MD  aspirin 81 MG EC tablet Take 1 tablet (81 mg total) by mouth daily. Swallow whole. 11/06/21   Arrien, York Ram, MD  atorvastatin (LIPITOR) 80 MG tablet TAKE 1 TABLET DAILY AT 6 P.M. (KEEP UPCOMING APPOINTMENT FOR FUTURE REFILLS 05/11/23   Runell Gess, MD  carvedilol (COREG) 3.125 MG tablet TAKE 1 TABLET(3.125 MG) BY MOUTH TWICE DAILY WITH A MEAL 10/14/23   Runell Gess, MD  clopidogrel (PLAVIX) 75 MG tablet Take 1 tablet (75 mg total) by mouth daily.  07/27/23   Joseph Art, DO  estrogens, conjugated, (PREMARIN) 1.25 MG tablet Take 1.25 mg by mouth daily.    [provider]  feeding supplement (ENSURE ENLIVE / ENSURE PLUS) LIQD Take 237 mLs by mouth 2 (two) times daily between meals. 11/06/21   Arrien, York Ram, MD  furosemide (LASIX) 40 MG tablet Take 0.5 tablets (20 mg total) by mouth daily. TAKE ONE-HALF (1/2) TABLET (20 MG TOTAL) DAILY 09/06/23   Runell Gess, MD  JARDIANCE 10 MG TABS tablet TAKE 1 TABLET DAILY 01/07/23   Runell Gess, MD  lamoTRIgine (LAMICTAL) 100 MG tablet Take 1 tablet (100 mg total) by mouth 2 (two) times daily. 08/22/23   Glean Salvo, NP  levothyroxine (SYNTHROID) 25 MCG tablet Take 1 tablet (25 mcg total) by mouth daily before breakfast. 06/24/23   Melida Quitter, PA  losartan (COZAAR) 50 MG tablet TAKE ONE-HALF (1/2) TABLET DAILY 01/24/23   Runell Gess, MD  nicotine (NICOTROL) 10 MG inhaler Inhale 1 Cartridge (1 continuous puffing total) into the lungs as needed for smoking cessation. 10/14/23   Sandre Kitty, MD  potassium chloride SA (KLOR-CON M) 20 MEQ tablet TAKE 1 TABLET DAILY (KEEP UPCOMING APPOINTMENT FOR FUTURE REFILLS) Patient taking differently: Take 20 mEq by mouth daily. 02/28/23   Corrin Parker, PA-C  progesterone (PROMETRIUM) 200 MG capsule Take 200 mg by mouth at bedtime. 04/20/22   [provider]      Allergies    Azithromycin, Erythromycin, Penicillins, and Percocet [oxycodone-acetaminophen]    Review of Systems   Review of Systems  Constitutional:  Negative for fever.  HENT:  Positive for congestion.   Respiratory:  Positive for cough, shortness of breath and wheezing.   Cardiovascular:  Negative for chest pain.  All other systems reviewed and are negative.   Physical Exam Updated Vital Signs BP 122/68   Pulse 75   Temp 98.5 F (36.9 C) (Oral)   Resp (!) 22   SpO2 99%  Physical Exam Vitals and nursing note reviewed.  Constitutional:       General: She is not in acute distress.    Appearance: She is not ill-appearing.     Comments: Frail, thin  HENT:     Head: Normocephalic and atraumatic.     Mouth/Throat:     Mouth: Mucous membranes are moist.  Eyes:     Pupils: Pupils are equal, round, and reactive to light.  Cardiovascular:     Rate and Rhythm: Normal rate and regular rhythm.     Heart sounds: Normal heart sounds.  Pulmonary:     Effort: Pulmonary effort is normal. No respiratory distress.     Breath sounds: No wheezing.     Comments: Poor air movement in all lung fields, expiratory wheeze noted, no respiratory distress Abdominal:     General: Bowel sounds are normal.     Palpations: Abdomen is soft.  Musculoskeletal:     Cervical back: Neck supple.     Right lower leg: No edema.     Left lower leg: No edema.  Skin:    General: Skin is warm and dry.  Neurological:     Mental Status: She is alert and oriented to person, place, and time.  Psychiatric:        Mood and Affect: Mood normal.     ED Results / Procedures / Treatments   Labs (all labs ordered are listed, but only abnormal results are displayed) Labs Reviewed  COMPREHENSIVE METABOLIC PANEL - Abnormal; Notable for the following components:      Result Value   Sodium 132 (*)    Potassium 3.0 (*)    Chloride 94 (*)    Glucose, Bld 108 (*)    Creatinine, Ser 1.08 (*)    Albumin 3.2 (*)    GFR, Estimated 56 (*)    All other components within normal limits  CBC - Abnormal; Notable for the following components:   Hemoglobin 11.9 (*)    nRBC 0.3 (*)    All other components within normal limits  RESP PANEL BY RT-PCR (RSV, FLU A&B, COVID)  [id number]    EKG EKG Interpretation Date/Time:  Monday October 24 2023 17:36:02 EDT Ventricular Rate:  79 PR Interval:  154 QRS Duration:  82 QT Interval:  418 QTC Calculation: 479 R Axis:   118  Text Interpretation: Normal sinus rhythm Left posterior fascicular block Nonspecific ST abnormality Abnormal  ECG When compared with ECG of 31-Aug-2023 16:15, PREVIOUS ECG IS PRESENT Confirmed by Ross Marcus ([id number]) on 10/24/2023 11:33:22 PM  Radiology DG Chest 1 View Result Date: 10/24/2023 CLINICAL DATA:  Shortness of breath. EXAM: CHEST  1 VIEW COMPARISON:  07/31/2023. FINDINGS: The heart size and mediastinal contours are within normal limits. Emphysematous changes are noted in the lungs. No consolidation, effusion, or pneumothorax. No acute osseous abnormality. IMPRESSION: 1. No active disease. 2. Emphysema. Electronically Signed   By:  Thornell Sartorius M.D.   On: 10/24/2023 19:55    Procedures Procedures  {Document cardiac monitor, telemetry assessment procedure when appropriate:1}  Medications Ordered in ED Medications  ipratropium-albuterol (DUONEB) 0.5-2.5 (3) MG/3ML nebulizer solution 3 mL (3 mLs Nebulization Given 10/24/23 2351)  dexamethasone (DECADRON) injection 10 mg (10 mg Intravenous Given 10/24/23 2350)    ED Course/ Medical Decision Making/ A&P   {   Click here for ABCD2, HEART and other calculatorsREFRESH Note before signing :1}                              Medical Decision Making Amount and/or Complexity of Data Reviewed Labs: ordered.  Risk Prescription drug management.   ***  {Document critical care time when appropriate:1} {Document review of labs and clinical decision tools ie heart score, Chads2Vasc2 etc:1}  {Document your independent review of radiology images, and any outside records:1} {Document your discussion with family members, caretakers, and with consultants:1} {Document social determinants of health affecting pt's care:1} {Document your decision making why or why not admission, treatments were needed:1} Final Clinical Impression(s) / ED Diagnoses Final diagnoses:  None    Rx / DC Orders ED Discharge Orders     None

## 2023-10-24 NOTE — ED Notes (Signed)
 Patient is being discharged from the Urgent Care and sent to the Emergency Department via POV . Per Erma Pinto, PA, patient is in need of higher level of care due to SOB. Patient is aware and verbalizes understanding of plan of care.  Vitals:   10/24/23 1636 10/24/23 1640  BP: 120/77   Pulse:  85  Temp: 98 F (36.7 C)   SpO2:  94%

## 2023-10-25 ENCOUNTER — Telehealth: Payer: Self-pay | Admitting: Surgery

## 2023-10-25 ENCOUNTER — Ambulatory Visit: Payer: Medicare Other | Admitting: Family Medicine

## 2023-10-25 LAB — RESP PANEL BY RT-PCR (RSV, FLU A&B, COVID)  RVPGX2
Influenza A by PCR: NEGATIVE
Influenza B by PCR: NEGATIVE
Resp Syncytial Virus by PCR: NEGATIVE
SARS Coronavirus 2 by RT PCR: POSITIVE — AB

## 2023-10-25 MED ORDER — IPRATROPIUM-ALBUTEROL 0.5-2.5 (3) MG/3ML IN SOLN
3.0000 mL | Freq: Once | RESPIRATORY_TRACT | Status: AC
Start: 2023-10-25 — End: 2023-10-25
  Administered 2023-10-25: 3 mL via RESPIRATORY_TRACT
  Filled 2023-10-25: qty 3

## 2023-10-25 MED ORDER — ALBUTEROL SULFATE HFA 108 (90 BASE) MCG/ACT IN AERS: 2.0000 | INHALATION_SPRAY | RESPIRATORY_TRACT | 0 refills | Status: DC | PRN

## 2023-10-25 NOTE — ED Notes (Signed)
 AVS provided by EDP was reviewed with pt. Pt verbalized understanding with no additional questions at this time. Pt verified pharmacy. Discussed importance f smoking cessation with pt. Pt wheelchair to car. Pt going home with s/d at bedside.

## 2023-10-25 NOTE — Discharge Instructions (Signed)
 You were seen today for shortness of breath.  You likely have a COPD exacerbation in addition to testing positive for COVID-19.  Given that your symptoms have been greater than a week, you likely would not benefit from any of the antivirals.  Continue nebulizers at home.  You are given a dose of Decadron in the emergency department.  Return for any new or worsening symptoms.

## 2023-10-25 NOTE — ED Notes (Signed)
 Pt reports worsening SOB over the past 3 days. SOB worsening with exertion and associated with a productive cough. No fevers. Reports she had been out of her [facility] which have expired. Also reports she is an current every day smoker.

## 2023-10-25 NOTE — Telephone Encounter (Signed)
 Patient called concerning discharge prescription for Albuterol inhaler. She states that she needed the Albuterol solution instead. Message sent to the ordering EDP.

## 2023-10-25 NOTE — ED Notes (Signed)
 Pt ambulated around the corner of the nurses station and back to the room. Initial VS RR 19, 94% on room air, and 95 HR. While ambulating pt became increasing SOB. RR 20s-29. VS upon completing ambulation 90% room air, RR 30, and HR 105. EDP [place] updated.

## 2023-10-26 ENCOUNTER — Telehealth: Payer: Self-pay

## 2023-10-26 NOTE — Transitions of Care (Post Inpatient/ED Visit) (Signed)
 10/26/2023  Name: Christie Moreno MRN: [id number] DOB: [birth year]  Today's TOC FU Call Status: Today's TOC FU Call Status:: Successful TOC FU Call Completed TOC FU Call Complete Date: 10/26/23 Patient's Name and Date of Birth confirmed.  Transition Care Management Follow-up Telephone Call Date of Discharge: 10/25/23 Discharge Facility: [facility]) Type of Discharge: Emergency Department Reason for ED Visit: Other: (COPD) How have you been since you were released from the hospital?: Better Any questions or concerns?: No  Items Reviewed: Did you receive and understand the discharge instructions provided?: Yes Medications obtained,verified, and reconciled?: Yes (Medications Reviewed) Any new allergies since your discharge?: No Dietary orders reviewed?: Yes Do you have support at home?: No  Medications Reviewed Today: Medications Reviewed Today     Reviewed by Karena Addison, LPN (Licensed Practical Nurse) on 10/26/23 at 1009  Med List Status: <None>   Medication Order Taking? Sig Documenting Provider Last Dose Status Informant  acetaminophen (TYLENOL) 325 MG tablet [id number] No Take 2 tablets (650 mg total) by mouth every 4 (four) hours as needed for headache or mild pain (pain score 1-3).  Patient not taking: Reported on 09/29/2023   Joseph Art, DO Not Taking Active   albuterol (PROVENTIL) (2.5 MG/3ML) 0.083% nebulizer solution [id number] No Take 3 mLs (2.5 mg total) by nebulization every 6 (six) hours as needed for wheezing or shortness of breath.  Patient taking differently: Take 3 mLs (2.5 mg total) by nebulization every 6 (six) hours as needed for wheezing or shortness of breath.   Arrien, York Ram, MD Taking Active Self  albuterol (VENTOLIN HFA) 108 (90 Base) MCG/ACT inhaler [id number]  Inhale 2 puffs into the lungs every 4 (four) hours as needed for wheezing or shortness of breath. Shon Baton, MD  Active   aspirin 81 MG EC tablet [id number] No Take  1 tablet (81 mg total) by mouth daily. Swallow whole. Arrien, York Ram, MD Taking Active Self  atorvastatin (LIPITOR) 80 MG tablet [id number] No TAKE 1 TABLET DAILY AT 6 P.M. (KEEP UPCOMING APPOINTMENT FOR FUTURE REFILLS Runell Gess, MD Taking Active Self  carvedilol (COREG) 3.125 MG tablet [id number]  TAKE 1 TABLET(3.125 MG) BY MOUTH TWICE DAILY WITH A MEAL Runell Gess, MD  Active   clopidogrel (PLAVIX) 75 MG tablet [id number] No Take 1 tablet (75 mg total) by mouth daily. Joseph Art, DO Taking Active   estrogens, conjugated, (PREMARIN) 1.25 MG tablet [id number] No Take 1.25 mg by mouth daily. [provider] Taking Active Self           Med Note [name], [name]   Mon Nov 02, 2021  5:03 AM)    feeding supplement (ENSURE ENLIVE / ENSURE PLUS) LIQD [id number] No Take 237 mLs by mouth 2 (two) times daily between meals. Arrien, York Ram, MD Taking Active Self  furosemide (LASIX) 40 MG tablet [id number] No Take 0.5 tablets (20 mg total) by mouth daily. TAKE ONE-HALF (1/2) TABLET (20 MG TOTAL) DAILY Runell Gess, MD Taking Active            Med Note Bing Ree, [place] Sep 29, 2023  1:32 PM) Take 1/2 tablet twice daily  JARDIANCE 10 MG TABS tablet [id number] No TAKE 1 TABLET DAILY Runell Gess, MD Taking Active Self  lamoTRIgine (LAMICTAL) 100 MG tablet [id number] No Take 1 tablet (100 mg total) by mouth 2 (two) times daily. Glean Salvo, NP Taking Active   levothyroxine (SYNTHROID) 25  MCG tablet [id number] No Take 1 tablet (25 mcg total) by mouth daily before breakfast. Melida Quitter, PA Taking Active Self  losartan (COZAAR) 50 MG tablet [id number] No TAKE ONE-HALF (1/2) TABLET DAILY Runell Gess, MD Taking Active Self  nicotine (NICOTROL) 10 MG inhaler [id number]  Inhale 1 Cartridge (1 continuous puffing total) into the lungs as needed for smoking cessation. Sandre Kitty, MD  Active   potassium chloride SA (KLOR-CON M) 20 MEQ tablet [id number] No TAKE 1  TABLET DAILY (KEEP UPCOMING APPOINTMENT FOR FUTURE REFILLS)  Patient taking differently: Take 20 mEq by mouth daily.   Corrin Parker, PA-C Taking Active Self  progesterone (PROMETRIUM) 200 MG capsule [id number] No Take 200 mg by mouth at bedtime. [provider] Taking Active Self            Home Care and Equipment/Supplies: Were Home Health Services Ordered?: NA Any new equipment or medical supplies ordered?: NA  Functional Questionnaire: Do you need assistance with bathing/showering or dressing?: No Do you need assistance with meal preparation?: No Do you need assistance with eating?: No Do you have difficulty maintaining continence: No Do you need assistance with getting out of bed/getting out of a chair/moving?: No Do you have difficulty managing or taking your medications?: No  Follow up appointments reviewed: PCP Follow-up appointment confirmed?: No (patient will call to schedule) MD Provider Line Number:[phone] Given: No Specialist Hospital Follow-up appointment confirmed?: NA Do you need transportation to your follow-up appointment?: No Do you understand care options if your condition(s) worsen?: Yes-patient verbalized understanding    SIGNATURE Karena Addison, LPN [facility] Nurse Health Advisor Direct Dial [phone]

## 2023-10-27 ENCOUNTER — Emergency Department (HOSPITAL_COMMUNITY)

## 2023-10-27 ENCOUNTER — Other Ambulatory Visit: Payer: Self-pay

## 2023-10-27 ENCOUNTER — Inpatient Hospital Stay (HOSPITAL_COMMUNITY)
Admission: EM | Admit: 2023-10-27 | Discharge: 2023-11-01 | DRG: 177 | Disposition: A | Discharge status: Home / Self Care | Attending: Family Medicine | Admitting: Family Medicine

## 2023-10-27 ENCOUNTER — Ambulatory Visit: Payer: Self-pay | Admitting: Family Medicine

## 2023-10-27 DIAGNOSIS — E785 Hyperlipidemia, unspecified: Secondary | ICD-10-CM | POA: Diagnosis present

## 2023-10-27 DIAGNOSIS — Z880 Allergy status to penicillin: Secondary | ICD-10-CM | POA: Diagnosis not present

## 2023-10-27 DIAGNOSIS — J441 Chronic obstructive pulmonary disease with (acute) exacerbation: Principal | ICD-10-CM

## 2023-10-27 DIAGNOSIS — Z79890 Hormone replacement therapy (postmenopausal): Secondary | ICD-10-CM | POA: Diagnosis not present

## 2023-10-27 DIAGNOSIS — I2510 Atherosclerotic heart disease of native coronary artery without angina pectoris: Secondary | ICD-10-CM | POA: Diagnosis present

## 2023-10-27 DIAGNOSIS — Z823 Family history of stroke: Secondary | ICD-10-CM

## 2023-10-27 DIAGNOSIS — Z833 Family history of diabetes mellitus: Secondary | ICD-10-CM

## 2023-10-27 DIAGNOSIS — U071 COVID-19: Principal | ICD-10-CM | POA: Diagnosis present

## 2023-10-27 DIAGNOSIS — J9601 Acute respiratory failure with hypoxia: Secondary | ICD-10-CM

## 2023-10-27 DIAGNOSIS — F17210 Nicotine dependence, cigarettes, uncomplicated: Secondary | ICD-10-CM | POA: Diagnosis present

## 2023-10-27 DIAGNOSIS — R7303 Prediabetes: Secondary | ICD-10-CM | POA: Diagnosis present

## 2023-10-27 DIAGNOSIS — R627 Adult failure to thrive: Secondary | ICD-10-CM | POA: Diagnosis present

## 2023-10-27 DIAGNOSIS — F419 Anxiety disorder, unspecified: Secondary | ICD-10-CM | POA: Diagnosis present

## 2023-10-27 DIAGNOSIS — Z801 Family history of malignant neoplasm of trachea, bronchus and lung: Secondary | ICD-10-CM

## 2023-10-27 DIAGNOSIS — Z7902 Long term (current) use of antithrombotics/antiplatelets: Secondary | ICD-10-CM | POA: Diagnosis not present

## 2023-10-27 DIAGNOSIS — G40909 Epilepsy, unspecified, not intractable, without status epilepticus: Secondary | ICD-10-CM

## 2023-10-27 DIAGNOSIS — E162 Hypoglycemia, unspecified: Secondary | ICD-10-CM

## 2023-10-27 DIAGNOSIS — E871 Hypo-osmolality and hyponatremia: Secondary | ICD-10-CM | POA: Diagnosis present

## 2023-10-27 DIAGNOSIS — Z885 Allergy status to narcotic agent status: Secondary | ICD-10-CM | POA: Diagnosis not present

## 2023-10-27 DIAGNOSIS — I5032 Chronic diastolic (congestive) heart failure: Secondary | ICD-10-CM | POA: Diagnosis present

## 2023-10-27 DIAGNOSIS — E876 Hypokalemia: Secondary | ICD-10-CM | POA: Diagnosis present

## 2023-10-27 DIAGNOSIS — R0602 Shortness of breath: Secondary | ICD-10-CM | POA: Diagnosis present

## 2023-10-27 DIAGNOSIS — Z7982 Long term (current) use of aspirin: Secondary | ICD-10-CM

## 2023-10-27 DIAGNOSIS — Z79899 Other long term (current) drug therapy: Secondary | ICD-10-CM

## 2023-10-27 DIAGNOSIS — I5030 Unspecified diastolic (congestive) heart failure: Secondary | ICD-10-CM

## 2023-10-27 DIAGNOSIS — Z7984 Long term (current) use of oral hypoglycemic drugs: Secondary | ICD-10-CM

## 2023-10-27 DIAGNOSIS — E039 Hypothyroidism, unspecified: Secondary | ICD-10-CM | POA: Diagnosis present

## 2023-10-27 DIAGNOSIS — I110 Hypertensive heart disease with heart failure: Secondary | ICD-10-CM | POA: Diagnosis present

## 2023-10-27 DIAGNOSIS — Z841 Family history of disorders of kidney and ureter: Secondary | ICD-10-CM | POA: Diagnosis not present

## 2023-10-27 DIAGNOSIS — R64 Cachexia: Secondary | ICD-10-CM | POA: Diagnosis present

## 2023-10-27 DIAGNOSIS — Z881 Allergy status to other antibiotic agents status: Secondary | ICD-10-CM

## 2023-10-27 DIAGNOSIS — Z803 Family history of malignant neoplasm of breast: Secondary | ICD-10-CM

## 2023-10-27 LAB — CBC WITH DIFFERENTIAL/PLATELET
Abs Immature Granulocytes: 0.03 10*3/uL (ref 0.00–0.07)
Basophils Absolute: 0 10*3/uL (ref 0.0–0.1)
Basophils Relative: 0 %
Eosinophils Absolute: 0.1 10*3/uL (ref 0.0–0.5)
Eosinophils Relative: 2 %
HCT: 37 % (ref 36.0–46.0)
Hemoglobin: 11.5 g/dL — ABNORMAL LOW (ref 12.0–15.0)
Immature Granulocytes: 1 %
Lymphocytes Relative: 26 %
Lymphs Abs: 1.3 10*3/uL (ref 0.7–4.0)
MCH: 26.1 pg (ref 26.0–34.0)
MCHC: 31.1 g/dL (ref 30.0–36.0)
MCV: 84.1 fL (ref 80.0–100.0)
Monocytes Absolute: 0.7 10*3/uL (ref 0.1–1.0)
Monocytes Relative: 15 %
Neutro Abs: 2.7 10*3/uL (ref 1.7–7.7)
Neutrophils Relative %: 56 %
Platelets: 228 10*3/uL (ref 150–400)
RBC: 4.4 MIL/uL (ref 3.87–5.11)
RDW: 15.1 % (ref 11.5–15.5)
WBC: 4.8 10*3/uL (ref 4.0–10.5)
nRBC: 0 % (ref 0.0–0.2)

## 2023-10-27 LAB — BASIC METABOLIC PANEL WITH GFR
Anion gap: 11 (ref 5–15)
BUN: 20 mg/dL (ref 8–23)
CO2: 22 mmol/L (ref 22–32)
Calcium: 8.7 mg/dL — ABNORMAL LOW (ref 8.9–10.3)
Chloride: 100 mmol/L (ref 98–111)
Creatinine, Ser: 0.92 mg/dL (ref 0.44–1.00)
GFR, Estimated: >60 mL/min (ref >60)
Glucose, Bld: 64 mg/dL — ABNORMAL LOW (ref 70–99)
Potassium: 3.9 mmol/L (ref 3.5–5.1)
Sodium: 133 mmol/L — ABNORMAL LOW (ref 135–145)

## 2023-10-27 LAB — TROPONIN I (HIGH SENSITIVITY)
Troponin I (High Sensitivity): 19 ng/L — ABNORMAL HIGH (ref <18)
Troponin I (High Sensitivity): 21 ng/L — ABNORMAL HIGH (ref <18)

## 2023-10-27 MED ORDER — METHYLPREDNISOLONE SODIUM SUCC 125 MG IJ SOLR
125.0000 mg | Freq: Once | INTRAMUSCULAR | Status: AC
Start: 2023-10-27 — End: 2023-10-27
  Administered 2023-10-27: 125 mg via INTRAVENOUS
  Filled 2023-10-27: qty 2

## 2023-10-27 MED ORDER — IPRATROPIUM-ALBUTEROL 0.5-2.5 (3) MG/3ML IN SOLN
3.0000 mL | Freq: Once | RESPIRATORY_TRACT | Status: AC
  Administered 2023-10-27: 3 mL via RESPIRATORY_TRACT
  Filled 2023-10-27: qty 3

## 2023-10-27 MED ORDER — ALBUTEROL SULFATE HFA 108 (90 BASE) MCG/ACT IN AERS
2.0000 | INHALATION_SPRAY | RESPIRATORY_TRACT | Status: DC | PRN
  Administered 2023-10-27: 2 via RESPIRATORY_TRACT
  Filled 2023-10-27: qty 6.7

## 2023-10-27 NOTE — Telephone Encounter (Signed)
 Difficulty breathing x1 week, getting worse Symptoms: difficulty sleeping, productive cough, congestion in ears  Pertinent Negatives: Patient denies fever, headache Disposition: [x] Urgent Care (no appt availability in office)   Additional Notes: Pt was in the hospital 3/24-3/25 and diagnosed with COVID. Pt states she feels worse today. Pt going to urgent care today. This RN educated pt on home care, new-worsening symptoms, when to call back/seek emergent care. Pt verbalized understanding and agrees to plan.    Copied from CRM [id number]. Topic: Clinical - Red Word Triage >> Oct 27, 2023  2:36 PM Antwanette L wrote: Red Word that prompted transfer to Nurse Triage: Patient tested positive for Covid on 3/24. Patient is having difficulty breathing, diarrhea, fatigue, and exhausted. Reason for Disposition  Continuous (nonstop) coughing interferes with work, school, or sleeping  Answer Assessment - Initial Assessment Questions Difficulty breathing x1 week, getting worse Symptoms: difficulty sleeping, productive cough, congestion in ears  Pertinent Negatives: Patient denies fever, headache  Protocols used: Breathing Difficulty-A-AH

## 2023-10-27 NOTE — H&P (Signed)
 History and Physical    Christie Moreno ZOX:[id number] DOB: 12-17-1955 DOA: 10/27/2023  PCP: Melida Quitter, PA  Patient coming from: Home  Chief Complaint: Shortness of breath  HPI: Christie Moreno is a 68 y.o. female with medical history significant of seizure disorder, aortic stenosis, hyperlipidemia, CAD, mesenteric ischemia status post SMA stenting in December 2024, CHF, COPD with chronic bronchitis, hypothyroidism, anxiety presented to the ED via EMS for evaluation of shortness of breath and hypoxemia in the setting of recent COVID infection (SARS-CoV-2 PCR positive 3 days ago).  Oxygen saturation 84-86% on room air with EMS and was placed on 4 L [place].  Vital signs on arrival to the ED: Temperature 98.4 F, pulse 76, respiratory rate 20, blood pressure 133/58, and SpO2 100% on 4 L [place].  Labs showing no leukocytosis, hemoglobin 11.5 (stable), sodium 133, glucose 64, troponin borderline elevated but flat (19> 21).  Chest x-ray showing severe emphysematous changes in the lungs and no focal consolidation.  EKG showing sinus rhythm and no acute ischemic changes. Patient was given DuoNeb and Solu-Medrol 125 mg in the ED.  TRH called to admit.  Patient is reporting 4-day history of worsening shortness of breath and productive cough.  States she was seen in the ED 3 days ago and tested positive for COVID.  She is using albuterol inhaler and DuoNeb at home without much improvement of her symptoms.  She also had diarrhea for 1 day initially which has resolved.  Denies nausea, vomiting, or abdominal pain.  Denies chest pain.  She is not on home oxygen.  Review of Systems:  Review of Systems  All other systems reviewed and are negative.   Past Medical History:  Diagnosis Date   Complex partial seizure (HCC) 04/22/2015   Convulsions (HCC) 04/19/2013   Heart failure (HCC)    Seizures (HCC)    none in years, recently had medication change    Past Surgical History:  Procedure Laterality Date    COLONOSCOPY     PERIPHERAL INTRAVASCULAR LITHOTRIPSY  07/25/2023   Procedure: PERIPHERAL INTRAVASCULAR LITHOTRIPSY;  Surgeon: Daria Pastures, MD;  Location: [facility] INVASIVE CV LAB;  Service: Cardiovascular;;   PERIPHERAL VASCULAR INTERVENTION  07/25/2023   Procedure: PERIPHERAL VASCULAR INTERVENTION;  Surgeon: Daria Pastures, MD;  Location: [facility] INVASIVE CV LAB;  Service: Cardiovascular;;   RIGHT/LEFT HEART CATH AND CORONARY ANGIOGRAPHY N/A 11/05/2021   Procedure: RIGHT/LEFT HEART CATH AND CORONARY ANGIOGRAPHY;  Surgeon: Lyn Records, MD;  Location: [facility] INVASIVE CV LAB;  Service: Cardiovascular;  Laterality: N/A;   VISCERAL ANGIOGRAPHY N/A 07/25/2023   Procedure: VISCERAL ANGIOGRAPHY;  Surgeon: Daria Pastures, MD;  Location: [facility] INVASIVE CV LAB;  Service: Cardiovascular;  Laterality: N/A;     reports that she has been smoking cigarettes. She started smoking about 54 years ago. She has a 52 pack-year smoking history. She has been exposed to tobacco smoke. She has never used smokeless tobacco. She reports that she does not currently use alcohol. She reports that she does not use drugs.  Allergies  Allergen Reactions   Azithromycin Nausea And Vomiting   Erythromycin     Sensitivity (GI)   Penicillins Itching and Rash   Percocet [Oxycodone-Acetaminophen] Rash    Family History  Adopted: Yes  Problem Relation Age of Onset   Breast cancer Mother    Diabetes Mother    Cancer Mother        breast   Stroke Mother    Heart Problems Father  Diabetes Sister    Kidney failure Sister    Breast cancer Maternal Aunt    Lung cancer Half-Sister    Colon cancer Neg Hx    Esophageal cancer Neg Hx    Epilepsy Neg Hx     Prior to Admission medications   Medication Sig Start Date End Date Taking? Authorizing Provider  acetaminophen (TYLENOL) 325 MG tablet Take 2 tablets (650 mg total) by mouth every 4 (four) hours as needed for headache or mild pain (pain score 1-3). Patient not taking:  Reported on 09/29/2023 07/26/23   Marlin Canary U, DO  albuterol (PROVENTIL) (2.5 MG/3ML) 0.083% nebulizer solution Take 3 mLs (2.5 mg total) by nebulization every 6 (six) hours as needed for wheezing or shortness of breath. Patient taking differently: Take 3 mLs (2.5 mg total) by nebulization every 6 (six) hours as needed for wheezing or shortness of breath. 11/06/21   Arrien, York Ram, MD  albuterol (VENTOLIN HFA) 108 (90 Base) MCG/ACT inhaler Inhale 2 puffs into the lungs every 4 (four) hours as needed for wheezing or shortness of breath. 10/25/23   Horton, Mayer Masker, MD  aspirin 81 MG EC tablet Take 1 tablet (81 mg total) by mouth daily. Swallow whole. 11/06/21   Arrien, York Ram, MD  atorvastatin (LIPITOR) 80 MG tablet TAKE 1 TABLET DAILY AT 6 P.M. (KEEP UPCOMING APPOINTMENT FOR FUTURE REFILLS 05/11/23   Runell Gess, MD  carvedilol (COREG) 3.125 MG tablet TAKE 1 TABLET(3.125 MG) BY MOUTH TWICE DAILY WITH A MEAL 10/14/23   Runell Gess, MD  clopidogrel (PLAVIX) 75 MG tablet Take 1 tablet (75 mg total) by mouth daily. 07/27/23   Joseph Art, DO  estrogens, conjugated, (PREMARIN) 1.25 MG tablet Take 1.25 mg by mouth daily.    [provider]  feeding supplement (ENSURE ENLIVE / ENSURE PLUS) LIQD Take 237 mLs by mouth 2 (two) times daily between meals. 11/06/21   Arrien, York Ram, MD  furosemide (LASIX) 40 MG tablet Take 0.5 tablets (20 mg total) by mouth daily. TAKE ONE-HALF (1/2) TABLET (20 MG TOTAL) DAILY 09/06/23   Runell Gess, MD  JARDIANCE 10 MG TABS tablet TAKE 1 TABLET DAILY 01/07/23   Runell Gess, MD  lamoTRIgine (LAMICTAL) 100 MG tablet Take 1 tablet (100 mg total) by mouth 2 (two) times daily. 08/22/23   Glean Salvo, NP  levothyroxine (SYNTHROID) 25 MCG tablet Take 1 tablet (25 mcg total) by mouth daily before breakfast. 06/24/23   Melida Quitter, PA  losartan (COZAAR) 50 MG tablet TAKE ONE-HALF (1/2) TABLET DAILY 01/24/23   Runell Gess,  MD  nicotine (NICOTROL) 10 MG inhaler Inhale 1 Cartridge (1 continuous puffing total) into the lungs as needed for smoking cessation. 10/14/23   Sandre Kitty, MD  potassium chloride SA (KLOR-CON M) 20 MEQ tablet TAKE 1 TABLET DAILY (KEEP UPCOMING APPOINTMENT FOR FUTURE REFILLS) Patient taking differently: Take 20 mEq by mouth daily. 02/28/23   Corrin Parker, PA-C  progesterone (PROMETRIUM) 200 MG capsule Take 200 mg by mouth at bedtime. 04/20/22   [provider]    Physical Exam: Vitals:   10/27/23 1923 10/27/23 1930 10/27/23 2009  BP: (!) 133/58    Pulse: 76 74 75  Resp: 20 (!) 24 19  Temp: 98.4 F (36.9 C)    TempSrc: Oral    SpO2: 100% 100% 100%    Physical Exam Vitals reviewed.  Constitutional:      General: She is not  in acute distress.    Comments: Resting comfortably, eating food  HENT:     Head: Normocephalic and atraumatic.  Eyes:     Extraocular Movements: Extraocular movements intact.  Cardiovascular:     Rate and Rhythm: Normal rate and regular rhythm.     Pulses: Normal pulses.  Pulmonary:     Effort: Pulmonary effort is normal. No respiratory distress.     Breath sounds: Rhonchi present. No wheezing.  Abdominal:     General: Bowel sounds are normal. There is no distension.     Palpations: Abdomen is soft.     Tenderness: There is no abdominal tenderness. There is no guarding.  Musculoskeletal:     Cervical back: Normal range of motion.     Right lower leg: No edema.     Left lower leg: No edema.  Skin:    General: Skin is warm and dry.  Neurological:     General: No focal deficit present.     Mental Status: She is alert and oriented to person, place, and time.     Labs on Admission: I have personally reviewed following labs and imaging studies  CBC: Recent Labs  Lab 10/24/23 1740 10/27/23 1940  WBC 6.6 4.8  NEUTROABS  --  2.7  HGB 11.9* 11.5*  HCT 36.6 37.0  MCV 81.9 84.1  PLT 152 228   Basic Metabolic Panel: Recent Labs   Lab 10/24/23 1740 10/27/23 1940  NA 132* 133*  K 3.0* 3.9  CL 94* 100  CO2 23 22  GLUCOSE 108* 64*  BUN 16 20  CREATININE 1.08* 0.92  CALCIUM 9.0 8.7*   GFR: CrCl cannot be calculated (Unknown ideal weight.). Liver Function Tests: Recent Labs  Lab 10/24/23 1740  AST 31  ALT 20  ALKPHOS 56  BILITOT 0.3  PROT 6.5  ALBUMIN 3.2*   No results for input(s): "LIPASE", "AMYLASE" in the last 168 hours. No results for input(s): "AMMONIA" in the last 168 hours. Coagulation Profile: No results for input(s): "INR", "PROTIME" in the last 168 hours. Cardiac Enzymes: No results for input(s): "CKTOTAL", "CKMB", "CKMBINDEX", "TROPONINI" in the last 168 hours. BNP (last 3 results) No results for input(s): "PROBNP" in the last 8760 hours. HbA1C: No results for input(s): "HGBA1C" in the last 72 hours. CBG: No results for input(s): "GLUCAP" in the last 168 hours. Lipid Profile: No results for input(s): "CHOL", "HDL", "LDLCALC", "TRIG", "CHOLHDL", "LDLDIRECT" in the last 72 hours. Thyroid Function Tests: No results for input(s): "TSH", "T4TOTAL", "FREET4", "T3FREE", "THYROIDAB" in the last 72 hours. Anemia Panel: No results for input(s): "VITAMINB12", "FOLATE", "FERRITIN", "TIBC", "IRON", "RETICCTPCT" in the last 72 hours. Urine analysis:    Component Value Date/Time   COLORURINE YELLOW 05/06/2023 1249   APPEARANCEUR CLEAR 05/06/2023 1249   LABSPEC 1.010 05/06/2023 1249   PHURINE 6.0 05/06/2023 1249   GLUCOSEU >=1000 (A) 05/06/2023 1249   HGBUR SMALL (A) 05/06/2023 1249   BILIRUBINUR NEGATIVE 05/06/2023 1249   KETONESUR NEGATIVE 05/06/2023 1249   PROTEINUR 100 (A) 06/14/2012 2008   UROBILINOGEN 0.2 05/06/2023 1249   NITRITE NEGATIVE 05/06/2023 1249   LEUKOCYTESUR NEGATIVE 05/06/2023 1249    Radiological Exams on Admission: DG Chest 1 View Result Date: 10/27/2023 CLINICAL DATA:  Shortness of breath. Diagnosed with COVID 2 days ago. Shortness of breath for 1 day. No relief on  inhaler use. Decreased oxygen saturation. EXAM: CHEST  1 VIEW COMPARISON:  10/24/2023 FINDINGS: Normal heart size and pulmonary vascularity. Prominent emphysematous changes in the lungs.  No airspace disease or consolidation. No pleural effusion or pneumothorax. Mediastinal contours appear intact. IMPRESSION: Severe emphysematous changes in the lungs.  No focal consolidation. Electronically Signed   By: Burman Nieves M.D.   On: 10/27/2023 20:31    Assessment and Plan  Acute COPD exacerbation in the setting of COVID infection Acute hypoxemic respiratory failure Patient tested positive for COVID 3 days ago and now presenting with worsening shortness of breath, cough, and rhonchi.  Oxygen saturation in the mid 80s on room air, currently stable on 2 L [place].  She is not on home oxygen.  Chest x-ray not suggestive of pneumonia.  Continue Solu-Medrol 40 mg every 12 hours, DuoNeb every 6 hours, albuterol neb every 2 hours PRN, Mucinex.  Start 5-day course of Paxlovid.  Pulmonary hygiene - incentive spirometry and flutter valve.  Continue supplemental oxygen, wean as tolerated.  Check stat D-dimer, if elevated, CT angiogram chest to rule out PE.  Mild hypoglycemia Likely due to poor p.o. intake in the setting of viral infection and also takes Jardiance for heart failure.  History of prediabetes with last A1c 5.9 in November 2024.  Hold Jardiance and encourage p.o. intake.  Hypoglycemia protocol ordered.  Continue CBG checks every 4 hours for now.  Mild hyponatremia Encourage p.o. intake and continue to monitor labs.  HFpEF Mitral and aortic valve insufficiency Last echo done in August 2024 showing EF 60-65%, grade 1 diastolic function, mild to moderate mitral regurgitation, mild mitral stenosis, moderate to severe aortic regurgitation, and mild aortic stenosis.  No signs of volume overload at this time.  Seizure disorder CAD: Troponin borderline elevated but flat (19> 21) and EKG without acute ischemic  changes.  Patient is not endorsing chest pain. Hypothyroidism Anxiety Continue home medications after pharmacy med rec is done.  DVT prophylaxis: Lovenox Code Status: Full Code (discussed with the patient) Family Communication: No family available at this time. Level of care: Telemetry bed Admission status: It is my clinical opinion that admission to INPATIENT is reasonable and necessary because of the expectation that this patient will require hospital care that crosses at least 2 midnights to treat this condition based on the medical complexity of the problems presented.  Given the aforementioned information, the predictability of an adverse outcome is felt to be significant.  John Giovanni MD [facility]  If 7PM-7AM, please contact night-coverage [email or web address]  10/27/2023, 11:21 PM

## 2023-10-27 NOTE — ED Provider Notes (Signed)
 [facility] AT [facility] Provider Note   CSN: [id number] Arrival date & time: 10/27/23  1920     History  Chief Complaint  Patient presents with   Shortness of Breath    Christie Moreno is a 68 y.o. female, history of COPD, heart failure, who presents to the ED secondary to worsening shortness of breath, this been going on for the last 2 days.  She states she was diagnosed with COVID about 2 days ago, after having symptoms for the last 5 days.  She notes that her shortness of breath is gotten worse, and she has been doing her DuoNebs at home, without any relief.  She states she uses them 3-4 times a day, with minimal relief.  Reports that she has been coughing more, and been more short of breath. When EMS found her, she was 84% on room air, and she is put on 4 L of O2, with up to sats of 94%. Denies any chest pain or swelling of legs.      Home Medications Prior to Admission medications   Medication Sig Start Date End Date Taking? Authorizing Provider  acetaminophen (TYLENOL) 325 MG tablet Take 2 tablets (650 mg total) by mouth every 4 (four) hours as needed for headache or mild pain (pain score 1-3). Patient not taking: Reported on 09/29/2023 07/26/23   Marlin Canary U, DO  albuterol (PROVENTIL) (2.5 MG/3ML) 0.083% nebulizer solution Take 3 mLs (2.5 mg total) by nebulization every 6 (six) hours as needed for wheezing or shortness of breath. Patient taking differently: Take 3 mLs (2.5 mg total) by nebulization every 6 (six) hours as needed for wheezing or shortness of breath. 11/06/21   Arrien, York Ram, MD  albuterol (VENTOLIN HFA) 108 (90 Base) MCG/ACT inhaler Inhale 2 puffs into the lungs every 4 (four) hours as needed for wheezing or shortness of breath. 10/25/23   Horton, Mayer Masker, MD  aspirin 81 MG EC tablet Take 1 tablet (81 mg total) by mouth daily. Swallow whole. 11/06/21   Arrien, York Ram, MD  atorvastatin (LIPITOR) 80 MG tablet TAKE  1 TABLET DAILY AT 6 P.M. (KEEP UPCOMING APPOINTMENT FOR FUTURE REFILLS 05/11/23   Runell Gess, MD  carvedilol (COREG) 3.125 MG tablet TAKE 1 TABLET(3.125 MG) BY MOUTH TWICE DAILY WITH A MEAL 10/14/23   Runell Gess, MD  clopidogrel (PLAVIX) 75 MG tablet Take 1 tablet (75 mg total) by mouth daily. 07/27/23   Joseph Art, DO  estrogens, conjugated, (PREMARIN) 1.25 MG tablet Take 1.25 mg by mouth daily.    [provider]  feeding supplement (ENSURE ENLIVE / ENSURE PLUS) LIQD Take 237 mLs by mouth 2 (two) times daily between meals. 11/06/21   Arrien, York Ram, MD  furosemide (LASIX) 40 MG tablet Take 0.5 tablets (20 mg total) by mouth daily. TAKE ONE-HALF (1/2) TABLET (20 MG TOTAL) DAILY 09/06/23   Runell Gess, MD  JARDIANCE 10 MG TABS tablet TAKE 1 TABLET DAILY 01/07/23   Runell Gess, MD  lamoTRIgine (LAMICTAL) 100 MG tablet Take 1 tablet (100 mg total) by mouth 2 (two) times daily. 08/22/23   Glean Salvo, NP  levothyroxine (SYNTHROID) 25 MCG tablet Take 1 tablet (25 mcg total) by mouth daily before breakfast. 06/24/23   Melida Quitter, PA  losartan (COZAAR) 50 MG tablet TAKE ONE-HALF (1/2) TABLET DAILY 01/24/23   Runell Gess, MD  nicotine (NICOTROL) 10 MG inhaler Inhale 1 Cartridge (1 continuous  puffing total) into the lungs as needed for smoking cessation. 10/14/23   Sandre Kitty, MD  potassium chloride SA (KLOR-CON M) 20 MEQ tablet TAKE 1 TABLET DAILY (KEEP UPCOMING APPOINTMENT FOR FUTURE REFILLS) Patient taking differently: Take 20 mEq by mouth daily. 02/28/23   Corrin Parker, PA-C  progesterone (PROMETRIUM) 200 MG capsule Take 200 mg by mouth at bedtime. 04/20/22   [provider]      Allergies    Azithromycin, Erythromycin, Penicillins, and Percocet [oxycodone-acetaminophen]    Review of Systems   Review of Systems  Constitutional:  Positive for fever.  Respiratory:  Positive for cough and shortness of breath.   Cardiovascular:   Negative for chest pain.    Physical Exam Updated Vital Signs BP (!) 133/58   Pulse 75   Temp 98.4 F (36.9 C) (Oral)   Resp 19   SpO2 100%  Physical Exam Vitals and nursing note reviewed.  Constitutional:      General: She is not in acute distress.    Appearance: She is well-developed.  HENT:     Head: Normocephalic and atraumatic.  Eyes:     Conjunctiva/sclera: Conjunctivae normal.  Cardiovascular:     Rate and Rhythm: Normal rate and regular rhythm.     Heart sounds: No murmur heard. Pulmonary:     Effort: Pulmonary effort is normal. No respiratory distress.     Breath sounds: Examination of the right-lower field reveals rhonchi. Examination of the left-lower field reveals rhonchi. Decreased breath sounds and rhonchi present.  Abdominal:     Palpations: Abdomen is soft.     Tenderness: There is no abdominal tenderness.  Musculoskeletal:        General: No swelling.     Cervical back: Neck supple.  Skin:    General: Skin is warm and dry.     Capillary Refill: Capillary refill takes less than 2 seconds.  Neurological:     Mental Status: She is alert.  Psychiatric:        Mood and Affect: Mood normal.     ED Results / Procedures / Treatments   Labs (all labs ordered are listed, but only abnormal results are displayed) Labs Reviewed  CBC WITH DIFFERENTIAL/PLATELET - Abnormal; Notable for the following components:      Result Value   Hemoglobin 11.5 (*)    All other components within normal limits  BASIC METABOLIC PANEL WITH GFR - Abnormal; Notable for the following components:   Sodium 133 (*)    Glucose, Bld 64 (*)    Calcium 8.7 (*)    All other components within normal limits  TROPONIN I (HIGH SENSITIVITY) - Abnormal; Notable for the following components:   Troponin I (High Sensitivity) 19 (*)    All other components within normal limits  TROPONIN I (HIGH SENSITIVITY)    EKG None  Radiology DG Chest 1 View Result Date: 10/27/2023 CLINICAL DATA:   Shortness of breath. Diagnosed with COVID 2 days ago. Shortness of breath for 1 day. No relief on inhaler use. Decreased oxygen saturation. EXAM: CHEST  1 VIEW COMPARISON:  10/24/2023 FINDINGS: Normal heart size and pulmonary vascularity. Prominent emphysematous changes in the lungs. No airspace disease or consolidation. No pleural effusion or pneumothorax. Mediastinal contours appear intact. IMPRESSION: Severe emphysematous changes in the lungs.  No focal consolidation. Electronically Signed   By: Burman Nieves M.D.   On: 10/27/2023 20:31    Procedures Procedures    Medications Ordered in ED Medications  albuterol (  VENTOLIN HFA) 108 (90 Base) MCG/ACT inhaler 2 puff (has no administration in time range)  ipratropium-albuterol (DUONEB) 0.5-2.5 (3) MG/3ML nebulizer solution 3 mL (3 mLs Nebulization Given 10/27/23 2013)  methylPREDNISolone sodium succinate (SOLU-MEDROL) 125 mg/2 mL injection 125 mg (125 mg Intravenous Given 10/27/23 2012)    ED Course/ Medical Decision Making/ A&P                                 Medical Decision Making Patient is a 68 year old female, here for 5 days of upper respiratory infectious symptoms, worsening shortness of breath today.  Was seen in the ER a few days ago, and diagnosed with COVID-19.  Now severely hypoxic, and short of breath.  History of COPD.  Has been using inhalers without relief.  She is requiring 4 L of O2, not on O2 at home.  Obtain chest x-ray, troponins, for further evaluation.  Will start on DuoNeb.  Amount and/or Complexity of Data Reviewed Labs: ordered.    Details: Unremarkable, negative troponin Radiology: ordered.    Details: Chest x-ray shows severe emphysema ECG/medicine tests:  Decision-making details documented in ED Course. Discussion of management or test interpretation with external provider(s): Patient's blood work is overall reassuring, after the DuoNeb, improved somewhat, only on 2 L of O2 now, however still requiring O2,  does not wear any home oxygen.  Given increased oxygen need, cough, upper respiratory infection, will admit to hospitalist, for COPD exacerbation, secondary to COVID-19.  Admitted to Dr. Loney Loh  Risk Prescription drug management. Decision regarding hospitalization.    Final Clinical Impression(s) / ED Diagnoses Final diagnoses:  COPD exacerbation (HCC)  COVID-19    Rx / DC Orders ED Discharge Orders     None         Rella Egelston, [place]ley [place], PA 10/27/23 2259    Linwood Dibbles, MD 10/28/23 1331

## 2023-10-27 NOTE — ED Triage Notes (Signed)
 Pt diagnosed with Covid x 2 days ago and has had increased SOB x 1 day. She reports increasing use of  her home inhaler x last 24 hours with no relief. EMS reports pt was 84-86% on RA upon their arrival; Sats increased to 100% on 4L of O2 via nasal cannula. Pt denies CP

## 2023-10-28 DIAGNOSIS — J9601 Acute respiratory failure with hypoxia: Secondary | ICD-10-CM

## 2023-10-28 DIAGNOSIS — J441 Chronic obstructive pulmonary disease with (acute) exacerbation: Secondary | ICD-10-CM

## 2023-10-28 DIAGNOSIS — E162 Hypoglycemia, unspecified: Secondary | ICD-10-CM

## 2023-10-28 DIAGNOSIS — I5030 Unspecified diastolic (congestive) heart failure: Secondary | ICD-10-CM

## 2023-10-28 LAB — GLUCOSE, CAPILLARY
Glucose-Capillary: 213 mg/dL — ABNORMAL HIGH (ref 70–99)
Glucose-Capillary: 242 mg/dL — ABNORMAL HIGH (ref 70–99)
Glucose-Capillary: 124 mg/dL — ABNORMAL HIGH (ref 70–99)
Glucose-Capillary: 131 mg/dL — ABNORMAL HIGH (ref 70–99)
Glucose-Capillary: 181 mg/dL — ABNORMAL HIGH (ref 70–99)
Glucose-Capillary: 244 mg/dL — ABNORMAL HIGH (ref 70–99)

## 2023-10-28 LAB — BASIC METABOLIC PANEL WITH GFR
Anion gap: 10 (ref 5–15)
BUN: 21 mg/dL (ref 8–23)
CO2: 23 mmol/L (ref 22–32)
Calcium: 8.2 mg/dL — ABNORMAL LOW (ref 8.9–10.3)
Chloride: 102 mmol/L (ref 98–111)
Creatinine, Ser: 0.99 mg/dL (ref 0.44–1.00)
GFR, Estimated: >60 mL/min (ref >60)
Glucose, Bld: 236 mg/dL — ABNORMAL HIGH (ref 70–99)
Potassium: 3.7 mmol/L (ref 3.5–5.1)
Sodium: 135 mmol/L (ref 135–145)

## 2023-10-28 LAB — TSH: TSH: 0.45 u[IU]/mL (ref 0.350–4.500)

## 2023-10-28 LAB — D-DIMER, QUANTITATIVE: D-Dimer, Quant: 0.97 ug{FEU}/mL — ABNORMAL HIGH (ref 0.00–0.50)

## 2023-10-28 MED ORDER — ENOXAPARIN SODIUM 30 MG/0.3ML IJ SOSY
30.0000 mg | PREFILLED_SYRINGE | INTRAMUSCULAR | Status: DC
  Administered 2023-10-28 – 2023-10-30 (×3): 30 mg via SUBCUTANEOUS
  Filled 2023-10-28 (×3): qty 0.3

## 2023-10-28 MED ORDER — GUAIFENESIN ER 600 MG PO TB12
600.0000 mg | ORAL_TABLET | Freq: Two times a day (BID) | ORAL | Status: DC
  Administered 2023-10-28 – 2023-11-01 (×10): 600 mg via ORAL
  Filled 2023-10-28 (×10): qty 1

## 2023-10-28 MED ORDER — IPRATROPIUM-ALBUTEROL 0.5-2.5 (3) MG/3ML IN SOLN
3.0000 mL | Freq: Four times a day (QID) | RESPIRATORY_TRACT | Status: DC
  Administered 2023-10-28 – 2023-10-29 (×6): 3 mL via RESPIRATORY_TRACT
  Filled 2023-10-28 (×6): qty 3

## 2023-10-28 MED ORDER — METHYLPREDNISOLONE SODIUM SUCC 40 MG IJ SOLR
40.0000 mg | Freq: Two times a day (BID) | INTRAMUSCULAR | Status: DC
  Administered 2023-10-28 – 2023-10-31 (×8): 40 mg via INTRAVENOUS
  Filled 2023-10-28 (×8): qty 1

## 2023-10-28 MED ORDER — DEXTROSE 50 % IV SOLN: 12.5000 g | INTRAVENOUS | Status: DC | PRN

## 2023-10-28 MED ORDER — ACETAMINOPHEN 325 MG PO TABS
650.0000 mg | ORAL_TABLET | Freq: Four times a day (QID) | ORAL | Status: DC | PRN
  Administered 2023-10-28: 650 mg via ORAL
  Filled 2023-10-28 (×2): qty 2

## 2023-10-28 MED ORDER — LAMOTRIGINE 100 MG PO TABS
100.0000 mg | ORAL_TABLET | Freq: Two times a day (BID) | ORAL | Status: DC
  Administered 2023-10-28 – 2023-11-01 (×9): 100 mg via ORAL
  Filled 2023-10-28 (×9): qty 1

## 2023-10-28 MED ORDER — LEVETIRACETAM 500 MG PO TABS
500.0000 mg | ORAL_TABLET | Freq: Two times a day (BID) | ORAL | Status: DC
  Administered 2023-10-28 – 2023-10-29 (×4): 500 mg via ORAL
  Filled 2023-10-28 (×5): qty 1

## 2023-10-28 MED ORDER — MENTHOL 3 MG MT LOZG: 1.0000 | LOZENGE | OROMUCOSAL | Status: DC | PRN

## 2023-10-28 MED ORDER — NIRMATRELVIR/RITONAVIR (PAXLOVID)TABLET
3.0000 | ORAL_TABLET | Freq: Two times a day (BID) | ORAL | Status: DC
  Administered 2023-10-28 – 2023-11-01 (×9): 3 via ORAL
  Filled 2023-10-28 (×2): qty 30

## 2023-10-28 MED ORDER — BUDESONIDE 0.25 MG/2ML IN SUSP
0.2500 mg | Freq: Two times a day (BID) | RESPIRATORY_TRACT | Status: DC
  Administered 2023-10-28 – 2023-11-01 (×8): 0.25 mg via RESPIRATORY_TRACT
  Filled 2023-10-28 (×9): qty 2

## 2023-10-28 NOTE — Progress Notes (Signed)
 NEW ADMISSION NOTE New Admission Note:    Arrival Method: ED stretcher Mental Orientation: AAOx4 Telemetry: 5M19 Assessment: Completed Skin: See flowsheet IV: RFA Pain: 0/10 Tubes: n/a Safety Measures: Safety Fall Prevention Plan has been given, discussed and signed Admission: Completed [facility] Orientation: Patient has been orientated to the room, unit and staff.  Family: none at bedside   Orders have been reviewed and implemented. Will continue to monitor the patient. Call light has been placed within reach and bed alarm has been activated.

## 2023-10-28 NOTE — Progress Notes (Signed)
 PROGRESS NOTE    Christie Moreno  ZOX:[id number] DOB: 1955/12/12 DOA: 10/27/2023 PCP: Melida Quitter, PA  Outpatient Specialists:     Brief Narrative:  Patient is a 68 year old female, cachectic, past medical history significant for seizure disorder, aortic stenosis, hyperlipidemia, coronary artery disease, mesenteric ischemia status post SMA stenting in December 2024, CHF, COPD, chronic bronchitis, hypothyroidism and anxiety.  Patient was admitted with shortness of breath.  Workup revealed COPD exacerbation and positive COVID 19 infection.  Patient is on Paxlovid.  Patient is also on IV Solu-Medrol and nebulizer treatment.  10/08/2023: Patient seen.  No new complaints.  Patient tells me that she is improving slowly.   Assessment & Plan:   Principal Problem:   COPD with acute exacerbation (HCC) Active Problems:   Seizure disorder (HCC)   Hyponatremia   COVID-19   Acute hypoxemic respiratory failure (HCC)   Hypoglycemia   (HFpEF) heart failure with preserved ejection fraction (HCC)   Acute COPD exacerbation in the setting of COVID infection Acute hypoxemic respiratory failure -Continue current regimen. -Add nebs Pulmicort Patient is slowly improving.  Mild hypoglycemia Likely due to poor p.o. intake Proceed with caloric count.  Hold Jardiance and encourage p.o. intake.  Hypoglycemia protocol ordered.  Continue CBG checks every 4 hours for now.   Mild hyponatremia Chronic. Sodium is 135 today. Continue to monitor.     HFpEF Mitral and aortic valve insufficiency Last echo done in August 2024 showing EF 60-65%, grade 1 diastolic function, mild to moderate mitral regurgitation, mild mitral stenosis, moderate to severe aortic regurgitation, and mild aortic stenosis.  No signs of volume overload at this time.   Seizure disorder CAD: Stable. Hypothyroidism Anxiety Cachexia: Caloric count.  Consult dietary team.  TSH.   DVT prophylaxis: Subcutaneous Lovenox Code  Status: Full code Family Communication:  Disposition Plan:    Consultants:  None  Procedures:  None  Antimicrobials:  None   Subjective: Shortness of breath is improving.  Objective: Vitals:   10/28/23 0804 10/28/23 0844 10/28/23 1423 10/28/23 1516  BP: 124/63   119/65  Pulse: 65   78  Resp: 18     Temp:    97.8 F (36.6 C)  TempSrc:    Oral  SpO2: 100% 100% 100% 100%  Weight:        Intake/Output Summary (Last 24 hours) at 10/28/2023 1617 Last data filed at 10/28/2023 0700 Gross per 24 hour  Intake --  Output 0 ml  Net 0 ml   Filed Weights   10/28/23 0050  Weight: 35.8 kg    Examination:  General exam: Patient is cachectic.  Not in any distress.  Awake and alert.  Patient is chronically ill looking. Respiratory system: Decreased air entry. Cardiovascular system: S1 & S2 heard Gastrointestinal system: Soft and nontender. Central nervous system: Alert and oriented.  Patient moves all extremities.   Data Reviewed: I have personally reviewed following labs and imaging studies  CBC: Recent Labs  Lab 10/24/23 1740 10/27/23 1940  WBC 6.6 4.8  NEUTROABS  --  2.7  HGB 11.9* 11.5*  HCT 36.6 37.0  MCV 81.9 84.1  PLT 152 228   Basic Metabolic Panel: Recent Labs  Lab 10/24/23 1740 10/27/23 1940 10/28/23 0209  NA 132* 133* 135  K 3.0* 3.9 3.7  CL 94* 100 102  CO2 23 22 23   GLUCOSE 108* 64* 236*  BUN 16 20 21   CREATININE 1.08* 0.92 0.99  CALCIUM 9.0 8.7* 8.2*   GFR: Estimated Creatinine  Clearance: 30.3 mL/min (by C-G formula based on SCr of 0.99 mg/dL). Liver Function Tests: Recent Labs  Lab 10/24/23 1740  AST 31  ALT 20  ALKPHOS 56  BILITOT 0.3  PROT 6.5  ALBUMIN 3.2*   No results for input(s): "LIPASE", "AMYLASE" in the last 168 hours. No results for input(s): "AMMONIA" in the last 168 hours. Coagulation Profile: No results for input(s): "INR", "PROTIME" in the last 168 hours. Cardiac Enzymes: No results for input(s): "CKTOTAL",  "CKMB", "CKMBINDEX", "TROPONINI" in the last 168 hours. BNP (last 3 results) No results for input(s): "PROBNP" in the last 8760 hours. HbA1C: No results for input(s): "HGBA1C" in the last 72 hours. CBG: Recent Labs  Lab 10/28/23 0145 10/28/23 0411 10/28/23 0824 10/28/23 1152  GLUCAP 213* 242* 124* 131*   Lipid Profile: No results for input(s): "CHOL", "HDL", "LDLCALC", "TRIG", "CHOLHDL", "LDLDIRECT" in the last 72 hours. Thyroid Function Tests: No results for input(s): "TSH", "T4TOTAL", "FREET4", "T3FREE", "THYROIDAB" in the last 72 hours. Anemia Panel: No results for input(s): "VITAMINB12", "FOLATE", "FERRITIN", "TIBC", "IRON", "RETICCTPCT" in the last 72 hours. Urine analysis:    Component Value Date/Time   COLORURINE YELLOW 05/06/2023 1249   APPEARANCEUR CLEAR 05/06/2023 1249   LABSPEC 1.010 05/06/2023 1249   PHURINE 6.0 05/06/2023 1249   GLUCOSEU >=1000 (A) 05/06/2023 1249   HGBUR SMALL (A) 05/06/2023 1249   BILIRUBINUR NEGATIVE 05/06/2023 1249   KETONESUR NEGATIVE 05/06/2023 1249   PROTEINUR 100 (A) 06/14/2012 2008   UROBILINOGEN 0.2 05/06/2023 1249   NITRITE NEGATIVE 05/06/2023 1249   LEUKOCYTESUR NEGATIVE 05/06/2023 1249   Sepsis Labs: @LABRCNTIP (procalcitonin:4,lacticidven:4)  ) Recent Results (from the past 240 hours)  Resp panel by RT-PCR (RSV, Flu A&B, Covid) Anterior Nasal Swab     Status: Abnormal   Collection Time: 10/24/23 10:53 PM   Specimen: Anterior Nasal Swab  Result Value Ref Range Status   SARS Coronavirus 2 by RT PCR POSITIVE (A) NEGATIVE Final   Influenza A by PCR NEGATIVE NEGATIVE Final   Influenza B by PCR NEGATIVE NEGATIVE Final    Comment: (NOTE) The Xpert Xpress SARS-CoV-2/FLU/RSV plus assay is intended as an aid in the diagnosis of influenza from Nasopharyngeal swab specimens and should not be used as a sole basis for treatment. Nasal washings and aspirates are unacceptable for Xpert Xpress SARS-CoV-2/FLU/RSV testing.  Fact Sheet for  Patients: BloggerCourse.com  Fact Sheet for Healthcare Providers: SeriousBroker.it  This test is not yet approved or cleared by the [place] FDA and has been authorized for detection and/or diagnosis of SARS-CoV-2 by FDA under an Emergency Use Authorization (EUA). This EUA will remain in effect (meaning this test can be used) for the duration of the COVID-19 declaration under Section 564(b)(1) of the Act, 21 U.S.C. section 360bbb-3(b)(1), unless the authorization is terminated or revoked.     Resp Syncytial Virus by PCR NEGATIVE NEGATIVE Final    Comment: (NOTE) Fact Sheet for Patients: BloggerCourse.com  Fact Sheet for Healthcare Providers: SeriousBroker.it  This test is not yet approved or cleared by the [place] FDA and has been authorized for detection and/or diagnosis of SARS-CoV-2 by FDA under an Emergency Use Authorization (EUA). This EUA will remain in effect (meaning this test can be used) for the duration of the COVID-19 declaration under Section 564(b)(1) of the Act, 21 U.S.C. section 360bbb-3(b)(1), unless the authorization is terminated or revoked.  Performed at [facility], [address] [place], [place] [address]  Radiology Studies: DG Chest 1 View Result Date: 10/27/2023 CLINICAL DATA:  Shortness of breath. Diagnosed with COVID 2 days ago. Shortness of breath for 1 day. No relief on inhaler use. Decreased oxygen saturation. EXAM: CHEST  1 VIEW COMPARISON:  10/24/2023 FINDINGS: Normal heart size and pulmonary vascularity. Prominent emphysematous changes in the lungs. No airspace disease or consolidation. No pleural effusion or pneumothorax. Mediastinal contours appear intact. IMPRESSION: Severe emphysematous changes in the lungs.  No focal consolidation. Electronically Signed   By: Burman Nieves M.D.   On: 10/27/2023 20:31         Scheduled Meds:  enoxaparin (LOVENOX) injection  30 mg Subcutaneous Q24H   guaiFENesin  600 mg Oral BID   ipratropium-albuterol  3 mL Nebulization Q6H   lamoTRIgine  100 mg Oral BID   levETIRAcetam  500 mg Oral BID   methylPREDNISolone (SOLU-MEDROL) injection  40 mg Intravenous Q12H   nirmatrelvir/ritonavir  3 tablet Oral BID   Continuous Infusions:   LOS: 1 day    Time spent: 55 minutes.    Berton Mount, MD  [facility] Pager #: [phone] 7PM-7AM contact night coverage as above

## 2023-10-29 ENCOUNTER — Encounter (HOSPITAL_COMMUNITY): Payer: Self-pay | Admitting: Internal Medicine

## 2023-10-29 DIAGNOSIS — J441 Chronic obstructive pulmonary disease with (acute) exacerbation: Secondary | ICD-10-CM | POA: Diagnosis not present

## 2023-10-29 LAB — RENAL FUNCTION PANEL
Albumin: 2.5 g/dL — ABNORMAL LOW (ref 3.5–5.0)
Anion gap: 9 (ref 5–15)
BUN: 19 mg/dL (ref 8–23)
CO2: 23 mmol/L (ref 22–32)
Calcium: 8.5 mg/dL — ABNORMAL LOW (ref 8.9–10.3)
Chloride: 104 mmol/L (ref 98–111)
Creatinine, Ser: 0.95 mg/dL (ref 0.44–1.00)
GFR, Estimated: >60 mL/min (ref >60)
Glucose, Bld: 181 mg/dL — ABNORMAL HIGH (ref 70–99)
Phosphorus: 3.5 mg/dL (ref 2.5–4.6)
Potassium: 3.9 mmol/L (ref 3.5–5.1)
Sodium: 136 mmol/L (ref 135–145)

## 2023-10-29 LAB — CBC WITH DIFFERENTIAL/PLATELET
Abs Immature Granulocytes: 0.03 10*3/uL (ref 0.00–0.07)
Basophils Absolute: 0 10*3/uL (ref 0.0–0.1)
Basophils Relative: 0 %
Eosinophils Absolute: 0 10*3/uL (ref 0.0–0.5)
Eosinophils Relative: 0 %
HCT: 31.4 % — ABNORMAL LOW (ref 36.0–46.0)
Hemoglobin: 10.2 g/dL — ABNORMAL LOW (ref 12.0–15.0)
Immature Granulocytes: 0 %
Lymphocytes Relative: 4 %
Lymphs Abs: 0.5 10*3/uL — ABNORMAL LOW (ref 0.7–4.0)
MCH: 26.6 pg (ref 26.0–34.0)
MCHC: 32.5 g/dL (ref 30.0–36.0)
MCV: 81.8 fL (ref 80.0–100.0)
Monocytes Absolute: 0.3 10*3/uL (ref 0.1–1.0)
Monocytes Relative: 3 %
Neutro Abs: 10.2 10*3/uL — ABNORMAL HIGH (ref 1.7–7.7)
Neutrophils Relative %: 93 %
Platelets: 236 10*3/uL (ref 150–400)
RBC: 3.84 MIL/uL — ABNORMAL LOW (ref 3.87–5.11)
RDW: 15.1 % (ref 11.5–15.5)
WBC: 11 10*3/uL — ABNORMAL HIGH (ref 4.0–10.5)
nRBC: 0 % (ref 0.0–0.2)

## 2023-10-29 LAB — GLUCOSE, CAPILLARY
Glucose-Capillary: 153 mg/dL — ABNORMAL HIGH (ref 70–99)
Glucose-Capillary: 155 mg/dL — ABNORMAL HIGH (ref 70–99)

## 2023-10-29 LAB — MAGNESIUM: Magnesium: 2.1 mg/dL (ref 1.7–2.4)

## 2023-10-29 LAB — PREALBUMIN: Prealbumin: 13 mg/dL — ABNORMAL LOW (ref 18–38)

## 2023-10-29 MED ORDER — ADULT MULTIVITAMIN W/MINERALS CH
1.0000 | ORAL_TABLET | Freq: Every day | ORAL | Status: DC
  Administered 2023-10-29: 1
  Filled 2023-10-29: qty 1

## 2023-10-29 MED ORDER — ALBUTEROL SULFATE (2.5 MG/3ML) 0.083% IN NEBU: 2.5000 mg | INHALATION_SOLUTION | Freq: Four times a day (QID) | RESPIRATORY_TRACT | Status: DC | PRN

## 2023-10-29 MED ORDER — CLOPIDOGREL BISULFATE 75 MG PO TABS
75.0000 mg | ORAL_TABLET | Freq: Every day | ORAL | Status: DC
  Administered 2023-10-29 – 2023-11-01 (×4): 75 mg via ORAL
  Filled 2023-10-29 (×4): qty 1

## 2023-10-29 MED ORDER — SUCRALFATE 1 G PO TABS
1.0000 g | ORAL_TABLET | Freq: Three times a day (TID) | ORAL | Status: DC
  Administered 2023-10-29 – 2023-11-01 (×11): 1 g via ORAL
  Filled 2023-10-29 (×11): qty 1

## 2023-10-29 MED ORDER — LEVOTHYROXINE SODIUM 25 MCG PO TABS
25.0000 ug | ORAL_TABLET | Freq: Every day | ORAL | Status: DC
  Administered 2023-10-29 – 2023-11-01 (×4): 25 ug via ORAL
  Filled 2023-10-29 (×4): qty 1

## 2023-10-29 MED ORDER — PANTOPRAZOLE SODIUM 40 MG PO TBEC
40.0000 mg | DELAYED_RELEASE_TABLET | Freq: Every day | ORAL | Status: DC
  Administered 2023-10-29 – 2023-11-01 (×4): 40 mg via ORAL
  Filled 2023-10-29 (×4): qty 1

## 2023-10-29 MED ORDER — ENSURE ENLIVE PO LIQD
237.0000 mL | Freq: Two times a day (BID) | ORAL | Status: DC
  Administered 2023-10-29 – 2023-10-30 (×2): 237 mL via ORAL

## 2023-10-29 MED ORDER — ADULT MULTIVITAMIN W/MINERALS CH
1.0000 | ORAL_TABLET | Freq: Every day | ORAL | Status: DC
  Administered 2023-10-30 – 2023-11-01 (×3): 1 via ORAL
  Filled 2023-10-29 (×3): qty 1

## 2023-10-29 NOTE — Progress Notes (Signed)
 Initial Nutrition Assessment  DOCUMENTATION CODES:   Underweight  INTERVENTION:   - 48-hour calorie count to start today at lunch meal, RN/NT to place envelope on pt's door and document intake on meal tickets  - Liberalize diet to Regular  - Ensure Enlive po BID, each supplement provides 350 kcal and 20 grams of protein, strawberry flavor  - MVI with minerals daily  NUTRITION DIAGNOSIS:   Increased nutrient needs related to acute illness as evidenced by estimated needs.  GOAL:   Patient will meet greater than or equal to 90% of their needs  MONITOR:   PO intake, Supplement acceptance, Weight trends  REASON FOR ASSESSMENT:   Consult Assessment of nutrition requirement/status  ASSESSMENT:   68 year old female who presented to the ED on 3/27 with SOB. PMH of COPD, CHF, aortic stenosis, seizure disorder, HLD, CAD, mesenteric ischemia s/p SMA stenting in December 2024, hypothyroidism, anxiety, recent COVID-19 diagnosis. Pt admitted with acute COPD exacerbation in the setting of COVID infection, acute hypoxemic respiratory failure.  RD consulted for assessment and 48-hour calorie count. Discussed pt with RN via secure chat. Calorie count has not yet been started. Requested RN or NT place envelope on pt's door and start calorie count with lunch meal. Orders adjusted.  Spoke with pt via phone call to room. Pt reports that she is eating but is a picky eater and hasn't like the food on her meal trays. She also reports that her appetite is decreased compared to baseline due to acute illness. RD to liberalize diet to Regular to provide pt with more food options at mealtimes.  Pt reports that she currently weighs 75 lbs. She states that at one point she had gained up to 80 lbs but has lost weight gain during current illness. Reviewed weight history in chart. Pt with a 1.3 kg (3.5%) weight loss from 08/05/23 to 10/29/23 (~ 3 months) which is not clinically significant for timeframe but is  concerning given BMI of 14.93 kg/m2 and previous diagnosis of severe malnutrition during admission in December 2024. Suspect malnutrition persists but unable to confirm with NFPE.  Pt willing to try strawberry Ensure oral nutrition supplements as well as take a daily MVI with minerals. RD to order. Discussed importance of adequate kcal and protein intake to prevent additional unintentional weight loss. Pt expressed understanding.  Medications reviewed and include: lamictal, IV solu-medrol, paxlovid  Labs reviewed: WBC 11.0 CBG's: 131-244 x 24 hours  NUTRITION - FOCUSED PHYSICAL EXAM:  Unable to complete. RD working remotely.  Diet Order:   Diet Order             Diet regular Room service appropriate? Yes with Assist; Fluid consistency: Thin  Diet effective now                   EDUCATION NEEDS:   Education needs have been addressed  Skin:  Skin Assessment: Reviewed RN Assessment  Last BM:  10/26/23  Height:   Ht Readings from Last 1 Encounters:  10/29/23 5\' 1"  (1.549 m)    Weight:   Wt Readings from Last 1 Encounters:  10/29/23 35.8 kg    BMI:  Body mass index is 14.93 kg/m.  Estimated Nutritional Needs:   Kcal:  1400-1600  Protein:  60-70 grams  Fluid:  1.4-1.6 L    Mertie Clause, MS, RD, LDN Registered Dietitian II Please see AMiON for contact information.

## 2023-10-29 NOTE — Progress Notes (Signed)
 PROGRESS NOTE    Christie Moreno  WUJ:[id number] DOB: October 05, 1955 DOA: 10/27/2023 PCP: Melida Quitter, PA  Outpatient Specialists:     Brief Narrative:  Patient is a 68 year old female, cachectic, past medical history significant for seizure disorder, aortic stenosis, hyperlipidemia, coronary artery disease, mesenteric ischemia status post SMA stenting in December 2024, CHF, COPD, chronic bronchitis, hypothyroidism and anxiety.  Patient was admitted with shortness of breath.  Workup revealed COPD exacerbation and positive COVID 19 infection.  Patient is on Paxlovid.  Patient is also on IV Solu-Medrol and nebulizer treatment.  10/28/2023: Patient seen.  No new complaints.  Patient tells me that she is improving slowly. 10/29/2023: Patient is slowly improving.  Patient reports vague epigastric/right-sided upper abdominal pain.  For trial of PPI and Carafate.  Continue other management.   Assessment & Plan:   Principal Problem:   COPD with acute exacerbation (HCC) Active Problems:   Seizure disorder (HCC)   Hyponatremia   COVID-19   Acute hypoxemic respiratory failure (HCC)   Hypoglycemia   (HFpEF) heart failure with preserved ejection fraction (HCC)   Acute COPD exacerbation in the setting of COVID infection Acute hypoxemic respiratory failure -Continue current regimen. -Continue nebs Pulmicort Patient is slowly improving.  Mild hypoglycemia Likely due to poor p.o. intake Proceed with caloric count.  Hold Jardiance and encourage p.o. intake.  Hypoglycemia protocol ordered.  Continue CBG checks every 4 hours for now.   Mild hyponatremia Chronic. Sodium is 135 today. Continue to monitor.     HFpEF Mitral and aortic valve insufficiency Last echo done in August 2024 showing EF 60-65%, grade 1 diastolic function, mild to moderate mitral regurgitation, mild mitral stenosis, moderate to severe aortic regurgitation, and mild aortic stenosis.  No signs of volume overload at  this time.   Seizure disorder CAD: Stable. Hypothyroidism Anxiety Cachexia: Caloric count.  Consult dietary team.  TSH.  Epigastric pain: -Start Protonix and Carafate.   DVT prophylaxis: Subcutaneous Lovenox Code Status: Full code Family Communication:  Disposition Plan:    Consultants:  None  Procedures:  None  Antimicrobials:  None   Subjective: Shortness of breath is improving.  Objective: Vitals:   10/29/23 0505 10/29/23 0822 10/29/23 0852 10/29/23 1722  BP: (!) 120/56 (!) 133/58  126/77  Pulse: 82 65  71  Resp: 18 18    Temp: 97.7 F (36.5 C)     TempSrc: Oral     SpO2:  100% 98% 99%  Weight: 35.8 kg     Height: 5\' 1"  (1.549 m)       Intake/Output Summary (Last 24 hours) at 10/29/2023 1849 Last data filed at 10/29/2023 1215 Gross per 24 hour  Intake 120 ml  Output --  Net 120 ml   Filed Weights   10/28/23 0050 10/29/23 0505  Weight: 35.8 kg 35.8 kg    Examination:  General exam: Patient is cachectic.  Not in any distress.  Awake and alert.  Patient is chronically ill looking. Respiratory system: Decreased air entry. Cardiovascular system: S1 & S2 heard Gastrointestinal system: Soft and nontender. Central nervous system: Alert and oriented.  Patient moves all extremities.   Data Reviewed: I have personally reviewed following labs and imaging studies  CBC: Recent Labs  Lab 10/24/23 1740 10/27/23 1940 10/29/23 0354  WBC 6.6 4.8 11.0*  NEUTROABS  --  2.7 10.2*  HGB 11.9* 11.5* 10.2*  HCT 36.6 37.0 31.4*  MCV 81.9 84.1 81.8  PLT 152 228 236   Basic Metabolic Panel:  Recent Labs  Lab 10/24/23 1740 10/27/23 1940 10/28/23 0209 10/29/23 0354  NA 132* 133* 135 136  K 3.0* 3.9 3.7 3.9  CL 94* 100 102 104  CO2 23 22 23 23   GLUCOSE 108* 64* 236* 181*  BUN 16 20 21 19   CREATININE 1.08* 0.92 0.99 0.95  CALCIUM 9.0 8.7* 8.2* 8.5*  MG  --   --   --  2.1  PHOS  --   --   --  3.5   GFR: Estimated Creatinine Clearance: 31.6 mL/min (by  C-G formula based on SCr of 0.95 mg/dL). Liver Function Tests: Recent Labs  Lab 10/24/23 1740 10/29/23 0354  AST 31  --   ALT 20  --   ALKPHOS 56  --   BILITOT 0.3  --   PROT 6.5  --   ALBUMIN 3.2* 2.5*   No results for input(s): "LIPASE", "AMYLASE" in the last 168 hours. No results for input(s): "AMMONIA" in the last 168 hours. Coagulation Profile: No results for input(s): "INR", "PROTIME" in the last 168 hours. Cardiac Enzymes: No results for input(s): "CKTOTAL", "CKMB", "CKMBINDEX", "TROPONINI" in the last 168 hours. BNP (last 3 results) No results for input(s): "PROBNP" in the last 8760 hours. HbA1C: No results for input(s): "HGBA1C" in the last 72 hours. CBG: Recent Labs  Lab 10/28/23 0824 10/28/23 1152 10/28/23 1634 10/28/23 2054 10/29/23 1209  GLUCAP 124* 131* 181* 244* 155*   Lipid Profile: No results for input(s): "CHOL", "HDL", "LDLCALC", "TRIG", "CHOLHDL", "LDLDIRECT" in the last 72 hours. Thyroid Function Tests: Recent Labs    10/28/23 2146  TSH 0.450   Anemia Panel: No results for input(s): "VITAMINB12", "FOLATE", "FERRITIN", "TIBC", "IRON", "RETICCTPCT" in the last 72 hours. Urine analysis:    Component Value Date/Time   COLORURINE YELLOW 05/06/2023 1249   APPEARANCEUR CLEAR 05/06/2023 1249   LABSPEC 1.010 05/06/2023 1249   PHURINE 6.0 05/06/2023 1249   GLUCOSEU >=1000 (A) 05/06/2023 1249   HGBUR SMALL (A) 05/06/2023 1249   BILIRUBINUR NEGATIVE 05/06/2023 1249   KETONESUR NEGATIVE 05/06/2023 1249   PROTEINUR 100 (A) 06/14/2012 2008   UROBILINOGEN 0.2 05/06/2023 1249   NITRITE NEGATIVE 05/06/2023 1249   LEUKOCYTESUR NEGATIVE 05/06/2023 1249   Sepsis Labs: @LABRCNTIP (procalcitonin:4,lacticidven:4)  ) Recent Results (from the past 240 hours)  Resp panel by RT-PCR (RSV, Flu A&B, Covid) Anterior Nasal Swab     Status: Abnormal   Collection Time: 10/24/23 10:53 PM   Specimen: Anterior Nasal Swab  Result Value Ref Range Status   SARS  Coronavirus 2 by RT PCR POSITIVE (A) NEGATIVE Final   Influenza A by PCR NEGATIVE NEGATIVE Final   Influenza B by PCR NEGATIVE NEGATIVE Final    Comment: (NOTE) The Xpert Xpress SARS-CoV-2/FLU/RSV plus assay is intended as an aid in the diagnosis of influenza from Nasopharyngeal swab specimens and should not be used as a sole basis for treatment. Nasal washings and aspirates are unacceptable for Xpert Xpress SARS-CoV-2/FLU/RSV testing.  Fact Sheet for Patients: BloggerCourse.com  Fact Sheet for Healthcare Providers: SeriousBroker.it  This test is not yet approved or cleared by the [place] FDA and has been authorized for detection and/or diagnosis of SARS-CoV-2 by FDA under an Emergency Use Authorization (EUA). This EUA will remain in effect (meaning this test can be used) for the duration of the COVID-19 declaration under Section 564(b)(1) of the Act, 21 U.S.C. section 360bbb-3(b)(1), unless the authorization is terminated or revoked.     Resp Syncytial Virus by PCR NEGATIVE NEGATIVE  Final    Comment: (NOTE) Fact Sheet for Patients: BloggerCourse.com  Fact Sheet for Healthcare Providers: SeriousBroker.it  This test is not yet approved or cleared by the [place] FDA and has been authorized for detection and/or diagnosis of SARS-CoV-2 by FDA under an Emergency Use Authorization (EUA). This EUA will remain in effect (meaning this test can be used) for the duration of the COVID-19 declaration under Section 564(b)(1) of the Act, 21 U.S.C. section 360bbb-3(b)(1), unless the authorization is terminated or revoked.  Performed at [facility], [address], [place], [place] [address]          Radiology Studies: DG Chest 1 View Result Date: 10/27/2023 CLINICAL DATA:  Shortness of breath. Diagnosed with COVID 2 days ago. Shortness of breath for 1 day. No relief  on inhaler use. Decreased oxygen saturation. EXAM: CHEST  1 VIEW COMPARISON:  10/24/2023 FINDINGS: Normal heart size and pulmonary vascularity. Prominent emphysematous changes in the lungs. No airspace disease or consolidation. No pleural effusion or pneumothorax. Mediastinal contours appear intact. IMPRESSION: Severe emphysematous changes in the lungs.  No focal consolidation. Electronically Signed   By: Burman Nieves M.D.   On: 10/27/2023 20:31        Scheduled Meds:  budesonide (PULMICORT) nebulizer solution  0.25 mg Nebulization BID   clopidogrel  75 mg Oral Daily   enoxaparin (LOVENOX) injection  30 mg Subcutaneous Q24H   feeding supplement  237 mL Oral BID BM   guaiFENesin  600 mg Oral BID   lamoTRIgine  100 mg Oral BID   levETIRAcetam  500 mg Oral BID   levothyroxine  25 mcg Oral Q0600   methylPREDNISolone (SOLU-MEDROL) injection  40 mg Intravenous Q12H   [START ON 10/30/2023] multivitamin with minerals  1 tablet Oral Daily   nirmatrelvir/ritonavir  3 tablet Oral BID   Continuous Infusions:   LOS: 2 days    Time spent: 35 minutes.    Berton Mount, MD  [facility] Pager #: [phone] 7PM-7AM contact night coverage as above

## 2023-10-29 NOTE — Plan of Care (Signed)

## 2023-10-30 DIAGNOSIS — J441 Chronic obstructive pulmonary disease with (acute) exacerbation: Secondary | ICD-10-CM | POA: Diagnosis not present

## 2023-10-30 LAB — GLUCOSE, CAPILLARY
Glucose-Capillary: 138 mg/dL — ABNORMAL HIGH (ref 70–99)
Glucose-Capillary: 171 mg/dL — ABNORMAL HIGH (ref 70–99)
Glucose-Capillary: 215 mg/dL — ABNORMAL HIGH (ref 70–99)

## 2023-10-30 MED ORDER — HEPARIN SODIUM (PORCINE) 5000 UNIT/ML IJ SOLN
5000.0000 [IU] | Freq: Two times a day (BID) | INTRAMUSCULAR | Status: DC
  Administered 2023-10-31 – 2023-11-01 (×3): 5000 [IU] via SUBCUTANEOUS
  Filled 2023-10-30 (×3): qty 1

## 2023-10-30 NOTE — Plan of Care (Signed)

## 2023-10-30 NOTE — Progress Notes (Signed)
 PROGRESS NOTE    Christie Moreno  FOY:[id number] DOB: 1956/04/07 DOA: 10/27/2023 PCP: Melida Quitter, PA  Outpatient Specialists:     Brief Narrative:  Patient is a 68 year old female, cachectic, past medical history significant for seizure disorder, aortic stenosis, hyperlipidemia, coronary artery disease, mesenteric ischemia status post SMA stenting in December 2024, CHF, COPD, chronic bronchitis, hypothyroidism and anxiety.  Patient was admitted with shortness of breath.  Workup revealed COPD exacerbation and positive COVID 19 infection.  Patient is on Paxlovid.  Patient is also on IV Solu-Medrol and nebulizer treatment.  10/28/2023: Patient seen.  No new complaints.  Patient tells me that she is improving slowly. 10/29/2023: Patient is slowly improving.  Patient reports vague epigastric/right-sided upper abdominal pain.  For trial of PPI and Carafate.  Continue other management.  10/30/2023: Mild improvement in respiratory symptoms noted.  Continue steroids.   Assessment & Plan:   Principal Problem:   COPD with acute exacerbation (HCC) Active Problems:   Seizure disorder (HCC)   Hyponatremia   COVID-19   Acute hypoxemic respiratory failure (HCC)   Hypoglycemia   (HFpEF) heart failure with preserved ejection fraction (HCC)   Acute COPD exacerbation in the setting of COVID infection Acute hypoxemic respiratory failure -Continue current regimen. -Continue nebs Pulmicort Patient is slowly improving.  Mild hypoglycemia Likely due to poor p.o. intake Proceed with caloric count.  Hold Jardiance and encourage p.o. intake.  Hypoglycemia protocol ordered.  Continue CBG checks every 4 hours for now.   Mild hyponatremia Chronic. Sodium is 135 today. Continue to monitor.     HFpEF Mitral and aortic valve insufficiency Last echo done in August 2024 showing EF 60-65%, grade 1 diastolic function, mild to moderate mitral regurgitation, mild mitral stenosis, moderate to severe  aortic regurgitation, and mild aortic stenosis.  No signs of volume overload at this time.   Seizure disorder CAD: Stable. Hypothyroidism Anxiety Cachexia: Caloric count.  Consult dietary team.  TSH.  Epigastric pain: -Start Protonix and Carafate.   DVT prophylaxis: Subcutaneous Lovenox Code Status: Full code Family Communication:  Disposition Plan:    Consultants:  None  Procedures:  None  Antimicrobials:  None   Subjective: Shortness of breath is improving.  Objective: Vitals:   10/29/23 2040 10/30/23 0640 10/30/23 0833 10/30/23 0847  BP: (!) 139/50 (!) 134/56  (!) 119/53  Pulse: 65 67  65  Resp: 19   16  Temp: 98.5 F (36.9 C) (!) 97.5 F (36.4 C)  (!) 97.5 F (36.4 C)  TempSrc: Oral Oral  Oral  SpO2: 100% 100% 99% 100%  Weight:      Height:        Intake/Output Summary (Last 24 hours) at 10/30/2023 1127 Last data filed at 10/30/2023 0815 Gross per 24 hour  Intake 360 ml  Output 0 ml  Net 360 ml   Filed Weights   10/28/23 0050 10/29/23 0505  Weight: 35.8 kg 35.8 kg    Examination:  General exam: Patient is cachectic.  Not in any distress.  Awake and alert.  Patient is chronically ill looking. Respiratory system: Decreased air entry. Cardiovascular system: S1 & S2 heard Gastrointestinal system: Soft and nontender. Central nervous system: Alert and oriented.  Patient moves all extremities.   Data Reviewed: I have personally reviewed following labs and imaging studies  CBC: Recent Labs  Lab 10/24/23 1740 10/27/23 1940 10/29/23 0354  WBC 6.6 4.8 11.0*  NEUTROABS  --  2.7 10.2*  HGB 11.9* 11.5* 10.2*  HCT 36.6  37.0 31.4*  MCV 81.9 84.1 81.8  PLT 152 228 236   Basic Metabolic Panel: Recent Labs  Lab 10/24/23 1740 10/27/23 1940 10/28/23 0209 10/29/23 0354  NA 132* 133* 135 136  K 3.0* 3.9 3.7 3.9  CL 94* 100 102 104  CO2 23 22 23 23   GLUCOSE 108* 64* 236* 181*  BUN 16 20 21 19   CREATININE 1.08* 0.92 0.99 0.95  CALCIUM 9.0 8.7*  8.2* 8.5*  MG  --   --   --  2.1  PHOS  --   --   --  3.5   GFR: Estimated Creatinine Clearance: 31.6 mL/min (by C-G formula based on SCr of 0.95 mg/dL). Liver Function Tests: Recent Labs  Lab 10/24/23 1740 10/29/23 0354  AST 31  --   ALT 20  --   ALKPHOS 56  --   BILITOT 0.3  --   PROT 6.5  --   ALBUMIN 3.2* 2.5*   No results for input(s): "LIPASE", "AMYLASE" in the last 168 hours. No results for input(s): "AMMONIA" in the last 168 hours. Coagulation Profile: No results for input(s): "INR", "PROTIME" in the last 168 hours. Cardiac Enzymes: No results for input(s): "CKTOTAL", "CKMB", "CKMBINDEX", "TROPONINI" in the last 168 hours. BNP (last 3 results) No results for input(s): "PROBNP" in the last 8760 hours. HbA1C: No results for input(s): "HGBA1C" in the last 72 hours. CBG: Recent Labs  Lab 10/28/23 1152 10/28/23 1634 10/28/23 2054 10/29/23 1209 10/29/23 2349  GLUCAP 131* 181* 244* 155* 153*   Lipid Profile: No results for input(s): "CHOL", "HDL", "LDLCALC", "TRIG", "CHOLHDL", "LDLDIRECT" in the last 72 hours. Thyroid Function Tests: Recent Labs    10/28/23 2146  TSH 0.450   Anemia Panel: No results for input(s): "VITAMINB12", "FOLATE", "FERRITIN", "TIBC", "IRON", "RETICCTPCT" in the last 72 hours. Urine analysis:    Component Value Date/Time   COLORURINE YELLOW 05/06/2023 1249   APPEARANCEUR CLEAR 05/06/2023 1249   LABSPEC 1.010 05/06/2023 1249   PHURINE 6.0 05/06/2023 1249   GLUCOSEU >=1000 (A) 05/06/2023 1249   HGBUR SMALL (A) 05/06/2023 1249   BILIRUBINUR NEGATIVE 05/06/2023 1249   KETONESUR NEGATIVE 05/06/2023 1249   PROTEINUR 100 (A) 06/14/2012 2008   UROBILINOGEN 0.2 05/06/2023 1249   NITRITE NEGATIVE 05/06/2023 1249   LEUKOCYTESUR NEGATIVE 05/06/2023 1249   Sepsis Labs: @LABRCNTIP (procalcitonin:4,lacticidven:4)  ) Recent Results (from the past 240 hours)  Resp panel by RT-PCR (RSV, Flu A&B, Covid) Anterior Nasal Swab     Status: Abnormal    Collection Time: 10/24/23 10:53 PM   Specimen: Anterior Nasal Swab  Result Value Ref Range Status   SARS Coronavirus 2 by RT PCR POSITIVE (A) NEGATIVE Final   Influenza A by PCR NEGATIVE NEGATIVE Final   Influenza B by PCR NEGATIVE NEGATIVE Final    Comment: (NOTE) The Xpert Xpress SARS-CoV-2/FLU/RSV plus assay is intended as an aid in the diagnosis of influenza from Nasopharyngeal swab specimens and should not be used as a sole basis for treatment. Nasal washings and aspirates are unacceptable for Xpert Xpress SARS-CoV-2/FLU/RSV testing.  Fact Sheet for Patients: BloggerCourse.com  Fact Sheet for Healthcare Providers: SeriousBroker.it  This test is not yet approved or cleared by the [place] FDA and has been authorized for detection and/or diagnosis of SARS-CoV-2 by FDA under an Emergency Use Authorization (EUA). This EUA will remain in effect (meaning this test can be used) for the duration of the COVID-19 declaration under Section 564(b)(1) of the Act, 21 U.S.C. section 360bbb-3(b)(1), unless  the authorization is terminated or revoked.     Resp Syncytial Virus by PCR NEGATIVE NEGATIVE Final    Comment: (NOTE) Fact Sheet for Patients: BloggerCourse.com  Fact Sheet for Healthcare Providers: SeriousBroker.it  This test is not yet approved or cleared by the [place] FDA and has been authorized for detection and/or diagnosis of SARS-CoV-2 by FDA under an Emergency Use Authorization (EUA). This EUA will remain in effect (meaning this test can be used) for the duration of the COVID-19 declaration under Section 564(b)(1) of the Act, 21 U.S.C. section 360bbb-3(b)(1), unless the authorization is terminated or revoked.  Performed at [facility], [address], [place], [place] [address]          Radiology Studies: No results found.       Scheduled  Meds:  budesonide (PULMICORT) nebulizer solution  0.25 mg Nebulization BID   clopidogrel  75 mg Oral Daily   enoxaparin (LOVENOX) injection  30 mg Subcutaneous Q24H   feeding supplement  237 mL Oral BID BM   guaiFENesin  600 mg Oral BID   lamoTRIgine  100 mg Oral BID   levETIRAcetam  500 mg Oral BID   levothyroxine  25 mcg Oral Q0600   methylPREDNISolone (SOLU-MEDROL) injection  40 mg Intravenous Q12H   multivitamin with minerals  1 tablet Oral Daily   nirmatrelvir/ritonavir  3 tablet Oral BID   pantoprazole  40 mg Oral Daily   sucralfate  1 g Oral TID WC & HS   Continuous Infusions:   LOS: 3 days    Time spent: 35 minutes.    Berton Mount, MD  [facility] Pager #: [phone] 7PM-7AM contact night coverage as above

## 2023-10-30 NOTE — Progress Notes (Signed)
 Pt refused keppra 500 mg po. Stated that she did not take this med at home. Only took lacmital for seizure. RN educated pt keppra also for seizure but pt kept refused. MD notified.   Lawson Radar, RN

## 2023-10-31 DIAGNOSIS — J441 Chronic obstructive pulmonary disease with (acute) exacerbation: Secondary | ICD-10-CM | POA: Diagnosis not present

## 2023-10-31 LAB — GLUCOSE, CAPILLARY
Glucose-Capillary: 120 mg/dL — ABNORMAL HIGH (ref 70–99)
Glucose-Capillary: 117 mg/dL — ABNORMAL HIGH (ref 70–99)
Glucose-Capillary: 155 mg/dL — ABNORMAL HIGH (ref 70–99)

## 2023-10-31 MED ORDER — LEVETIRACETAM 500 MG PO TABS
500.0000 mg | ORAL_TABLET | Freq: Two times a day (BID) | ORAL | Status: DC
  Administered 2023-10-31 – 2023-11-01 (×3): 500 mg via ORAL
  Filled 2023-10-31 (×3): qty 1

## 2023-10-31 NOTE — Progress Notes (Signed)
 PROGRESS NOTE    Christie Moreno  JYN:[id number] DOB: [birth year] DOA: 10/27/2023 PCP: Melida Quitter, PA  Outpatient Specialists:     Brief Narrative:  Patient is a 68 year old female, cachectic, past medical history significant for seizure disorder, aortic stenosis, hyperlipidemia, coronary artery disease, mesenteric ischemia status post SMA stenting in December 2024, CHF, COPD, chronic bronchitis, hypothyroidism and anxiety.  Patient was admitted with shortness of breath.  Workup revealed COPD exacerbation and positive COVID 19 infection.  Patient is on Paxlovid.  Patient is also on IV Solu-Medrol and nebulizer treatment.  10/31/2023: COPD exacerbation is slowly improving.  Continue current regimen.  Complete course of Paxlovid.  Patient is currently on Lamictal for seizure, and on Paxlovid for COVID infection.  Keppra started as Paxlovid can decrease Lamictal level.  Need for Keppra explained to patient extensively.  Likely discharge when COPD is optimized.  Assessment & Plan:   Principal Problem:   COPD with acute exacerbation (HCC) Active Problems:   Seizure disorder (HCC)   Hyponatremia   COVID-19   Acute hypoxemic respiratory failure (HCC)   Hypoglycemia   (HFpEF) heart failure with preserved ejection fraction (HCC)   Acute COPD exacerbation in the setting of COVID infection Acute hypoxemic respiratory failure -Continue current regimen. -Continue nebs Pulmicort Patient is slowly improving.  Mild hypoglycemia Likely due to poor p.o. intake Continue caloric count.    Mild hyponatremia Chronic. Last sodium level of 136.   Continue to monitor.     HFpEF Mitral and aortic valve insufficiency Last echo done in August 2024 showing EF 60-65%, grade 1 diastolic function, mild to moderate mitral regurgitation, mild mitral stenosis, moderate to severe aortic regurgitation, and mild aortic stenosis.  No signs of volume overload at this time.   Seizure disorder CAD:  Stable. Hypothyroidism Anxiety Cachexia: Caloric count.  Consult dietary team.  TSH.  Epigastric pain: -Start Protonix and Carafate.   DVT prophylaxis: Subcutaneous Lovenox Code Status: Full code Family Communication:  Disposition Plan:    Consultants:  None  Procedures:  None  Antimicrobials:  None   Subjective: Shortness of breath is improving.  Objective: Vitals:   10/30/23 2119 10/31/23 0458 10/31/23 0800 10/31/23 0841  BP:  122/65  (!) 118/55  Pulse: 85 72  68  Resp: (!) 22 18  18   Temp:  98.1 F (36.7 C)  97.7 F (36.5 C)  TempSrc:    Oral  SpO2: 97% 100% 99% 98%  Weight:      Height:        Intake/Output Summary (Last 24 hours) at 10/31/2023 1119 Last data filed at 10/31/2023 0849 Gross per 24 hour  Intake 360 ml  Output 0 ml  Net 360 ml   Filed Weights   10/28/23 0050 10/29/23 0505  Weight: 35.8 kg 35.8 kg    Examination:  General exam: Patient is cachectic.  Not in any distress.  Awake and alert.  Patient is chronically ill looking. Respiratory system: Decreased air entry. Cardiovascular system: S1 & S2 heard Gastrointestinal system: Soft and nontender. Central nervous system: Alert and oriented.  Patient moves all extremities.   Data Reviewed: I have personally reviewed following labs and imaging studies  CBC: Recent Labs  Lab 10/24/23 1740 10/27/23 1940 10/29/23 0354  WBC 6.6 4.8 11.0*  NEUTROABS  --  2.7 10.2*  HGB 11.9* 11.5* 10.2*  HCT 36.6 37.0 31.4*  MCV 81.9 84.1 81.8  PLT 152 228 236   Basic Metabolic Panel: Recent Labs  Lab 10/24/23  1740 10/27/23 1940 10/28/23 0209 10/29/23 0354  NA 132* 133* 135 136  K 3.0* 3.9 3.7 3.9  CL 94* 100 102 104  CO2 23 22 23 23   GLUCOSE 108* 64* 236* 181*  BUN 16 20 21 19   CREATININE 1.08* 0.92 0.99 0.95  CALCIUM 9.0 8.7* 8.2* 8.5*  MG  --   --   --  2.1  PHOS  --   --   --  3.5   GFR: Estimated Creatinine Clearance: 31.6 mL/min (by C-G formula based on SCr of 0.95  mg/dL). Liver Function Tests: Recent Labs  Lab 10/24/23 1740 10/29/23 0354  AST 31  --   ALT 20  --   ALKPHOS 56  --   BILITOT 0.3  --   PROT 6.5  --   ALBUMIN 3.2* 2.5*   No results for input(s): "LIPASE", "AMYLASE" in the last 168 hours. No results for input(s): "AMMONIA" in the last 168 hours. Coagulation Profile: No results for input(s): "INR", "PROTIME" in the last 168 hours. Cardiac Enzymes: No results for input(s): "CKTOTAL", "CKMB", "CKMBINDEX", "TROPONINI" in the last 168 hours. BNP (last 3 results) No results for input(s): "PROBNP" in the last 8760 hours. HbA1C: No results for input(s): "HGBA1C" in the last 72 hours. CBG: Recent Labs  Lab 10/29/23 2349 10/30/23 1138 10/30/23 1852 10/30/23 2032 10/31/23 0505  GLUCAP 153* 138* 171* 215* 120*   Lipid Profile: No results for input(s): "CHOL", "HDL", "LDLCALC", "TRIG", "CHOLHDL", "LDLDIRECT" in the last 72 hours. Thyroid Function Tests: Recent Labs    10/28/23 2146  TSH 0.450   Anemia Panel: No results for input(s): "VITAMINB12", "FOLATE", "FERRITIN", "TIBC", "IRON", "RETICCTPCT" in the last 72 hours. Urine analysis:    Component Value Date/Time   COLORURINE YELLOW 05/06/2023 1249   APPEARANCEUR CLEAR 05/06/2023 1249   LABSPEC 1.010 05/06/2023 1249   PHURINE 6.0 05/06/2023 1249   GLUCOSEU >=1000 (A) 05/06/2023 1249   HGBUR SMALL (A) 05/06/2023 1249   BILIRUBINUR NEGATIVE 05/06/2023 1249   KETONESUR NEGATIVE 05/06/2023 1249   PROTEINUR 100 (A) 06/14/2012 2008   UROBILINOGEN 0.2 05/06/2023 1249   NITRITE NEGATIVE 05/06/2023 1249   LEUKOCYTESUR NEGATIVE 05/06/2023 1249   Sepsis Labs: @LABRCNTIP (procalcitonin:4,lacticidven:4)  ) Recent Results (from the past 240 hours)  Resp panel by RT-PCR (RSV, Flu A&B, Covid) Anterior Nasal Swab     Status: Abnormal   Collection Time: 10/24/23 10:53 PM   Specimen: Anterior Nasal Swab  Result Value Ref Range Status   SARS Coronavirus 2 by RT PCR POSITIVE (A)  NEGATIVE Final   Influenza A by PCR NEGATIVE NEGATIVE Final   Influenza B by PCR NEGATIVE NEGATIVE Final    Comment: (NOTE) The Xpert Xpress SARS-CoV-2/FLU/RSV plus assay is intended as an aid in the diagnosis of influenza from Nasopharyngeal swab specimens and should not be used as a sole basis for treatment. Nasal washings and aspirates are unacceptable for Xpert Xpress SARS-CoV-2/FLU/RSV testing.  Fact Sheet for Patients: BloggerCourse.com  Fact Sheet for Healthcare Providers: SeriousBroker.it  This test is not yet approved or cleared by the [place] FDA and has been authorized for detection and/or diagnosis of SARS-CoV-2 by FDA under an Emergency Use Authorization (EUA). This EUA will remain in effect (meaning this test can be used) for the duration of the COVID-19 declaration under Section 564(b)(1) of the Act, 21 U.S.C. section 360bbb-3(b)(1), unless the authorization is terminated or revoked.     Resp Syncytial Virus by PCR NEGATIVE NEGATIVE Final    Comment: (  NOTE) Fact Sheet for Patients: BloggerCourse.com  Fact Sheet for Healthcare Providers: SeriousBroker.it  This test is not yet approved or cleared by the [place] FDA and has been authorized for detection and/or diagnosis of SARS-CoV-2 by FDA under an Emergency Use Authorization (EUA). This EUA will remain in effect (meaning this test can be used) for the duration of the COVID-19 declaration under Section 564(b)(1) of the Act, 21 U.S.C. section 360bbb-3(b)(1), unless the authorization is terminated or revoked.  Performed at [facility], [address], [place], [place] [address]          Radiology Studies: No results found.       Scheduled Meds:  budesonide (PULMICORT) nebulizer solution  0.25 mg Nebulization BID   clopidogrel  75 mg Oral Daily   feeding supplement  237 mL Oral BID  BM   guaiFENesin  600 mg Oral BID   heparin injection (subcutaneous)  5,000 Units Subcutaneous Q12H   lamoTRIgine  100 mg Oral BID   levETIRAcetam  500 mg Oral BID   levothyroxine  25 mcg Oral Q0600   methylPREDNISolone (SOLU-MEDROL) injection  40 mg Intravenous Q12H   multivitamin with minerals  1 tablet Oral Daily   nirmatrelvir/ritonavir  3 tablet Oral BID   pantoprazole  40 mg Oral Daily   sucralfate  1 g Oral TID WC & HS   Continuous Infusions:   LOS: 4 days    Time spent: 35 minutes.    Berton Mount, MD  [facility] Pager #: [phone] 7PM-7AM contact night coverage as above

## 2023-10-31 NOTE — Progress Notes (Signed)
 Patient reported that she does not take Keppra for the management of seizure.   Per chart review of the neurology clinic visit from 9/12/0/2024 it is seems like that patient is only on Lamictal 100 mg twice daily.  Discontinuing Keppra and continue Lamictal.  Tereasa Coop, MD [facility] 10/31/2023, 2:19 AM

## 2023-11-01 DIAGNOSIS — J441 Chronic obstructive pulmonary disease with (acute) exacerbation: Secondary | ICD-10-CM | POA: Diagnosis not present

## 2023-11-01 LAB — GLUCOSE, CAPILLARY
Glucose-Capillary: 105 mg/dL — ABNORMAL HIGH (ref 70–99)
Glucose-Capillary: 159 mg/dL — ABNORMAL HIGH (ref 70–99)
Glucose-Capillary: 194 mg/dL — ABNORMAL HIGH (ref 70–99)

## 2023-11-01 MED ORDER — PREDNISONE 50 MG PO TABS: 50.0000 mg | ORAL_TABLET | Freq: Every day | ORAL | Status: DC

## 2023-11-01 MED ORDER — GUAIFENESIN ER 600 MG PO TB12: 600.0000 mg | ORAL_TABLET | Freq: Two times a day (BID) | ORAL | 0 refills | Status: AC

## 2023-11-01 MED ORDER — METHYLPREDNISOLONE 4 MG PO TBPK: ORAL_TABLET | ORAL | 0 refills | Status: DC

## 2023-11-01 MED ORDER — MENTHOL 3 MG MT LOZG: 1.0000 | LOZENGE | OROMUCOSAL | 12 refills | Status: DC | PRN

## 2023-11-01 MED ORDER — MAALOX MAX 400-400-40 MG/5ML PO SUSP: 10.0000 mL | Freq: Four times a day (QID) | ORAL | 0 refills | Status: DC | PRN

## 2023-11-01 MED ORDER — PANTOPRAZOLE SODIUM 40 MG PO TBEC: 40.0000 mg | DELAYED_RELEASE_TABLET | Freq: Every day | ORAL | 2 refills | Status: DC

## 2023-11-01 NOTE — Progress Notes (Addendum)
 Nutrition Follow-up   DOCUMENTATION CODES:   Severe malnutrition in context of chronic illness  INTERVENTION:  Liberalize to Regular diet as ordered.   Magic cup with lunch and dinner, each supplement provides 290 kcal and 9 grams of protein  [employer] Essentials with breakfast, each packet mixed with 8 ounces of 2% milk provides 13 grams of protein and 260 calories.  MVI with minerals  NUTRITION DIAGNOSIS:   Severe Malnutrition related to chronic illness as evidenced by severe fat depletion, severe muscle depletion.  GOAL:   Patient will meet greater than or equal to 90% of their needs  MONITOR:   PO intake, Supplement acceptance  REASON FOR ASSESSMENT:   Consult Assessment of nutrition requirement/status  ASSESSMENT:   68 year old female who presented to the ED on 3/27 with SOB. PMH of COPD, CHF, aortic stenosis, seizure disorder, HLD, CAD, mesenteric ischemia s/p SMA stenting in December 2024, hypothyroidism, anxiety, recent COVID-19 diagnosis. Pt admitted with acute COPD exacerbation in the setting of COVID infection, acute hypoxemic respiratory failure.  Incomplete calorie count information. Meal tray tickets had no % intake in envelope. Breakfast tray was observed on this date with 50% completion. Pt reports appetite is a little down since recent COPD exacerbation/COVID diagnosis. Snacks observed in room.   Pt weighs self everyday at home. Pt worried about the sugar content in Ensure but willing to try Magic Cup. Pt reports eating 3 meals a day at home. Pt reports restricting carbs/sugar due to T2DM in family. Pt is concerned about pre-diabetic A1c. RD educated on not restricting all together and including carbs/sugar in moderation. Talked through healthy fats such as avocado, and peanut butter.   Intake: Nursing flowsheets document meal completions of 100%.  Medications reviewed and include: synthroid, MVI with minerals, protonix, paxlovid  Labs reviewed:  A1c 5.9%, CBGs 105-215 x 24h   Intake/Output Summary (Last 24 hours) at 11/01/2023 1228 Last data filed at 11/01/2023 0826 Gross per 24 hour  Intake 840 ml  Output 0 ml  Net 840 ml    Weights reviewed. Admit weight 35.8 kg. Pt reports struggling with weight for whole life. EMR shows significant weight loss early 2024. -18# wt loss in 1 year (20%).    NUTRITION - FOCUSED PHYSICAL EXAM:  Flowsheet Row Most Recent Value  Orbital Region Moderate depletion  Upper Arm Region Severe depletion  Thoracic and Lumbar Region Severe depletion  Buccal Region Moderate depletion  Temple Region Moderate depletion  Clavicle Bone Region Severe depletion  Clavicle and Acromion Bone Region Severe depletion  Scapular Bone Region Severe depletion  Dorsal Hand Severe depletion  Patellar Region Severe depletion  Anterior Thigh Region Severe depletion  Posterior Calf Region Severe depletion  Edema (RD Assessment) Mild  Hair Reviewed  Eyes Reviewed  Mouth Reviewed  Skin Reviewed  Nails Reviewed       Diet Order:   Diet Order             Diet - low sodium heart healthy           Diet regular Room service appropriate? Yes with Assist; Fluid consistency: Thin  Diet effective now                   EDUCATION NEEDS:   Education needs have been addressed  Skin:  Skin Assessment: Reviewed RN Assessment  Last BM:  10/26/23  Height:   Ht Readings from Last 1 Encounters:  10/29/23 5\' 1"  (1.549 m)    Weight:  Wt Readings from Last 1 Encounters:  10/29/23 35.8 kg    Ideal Body Weight:  47.7 kg  BMI:  Body mass index is 14.93 kg/m.  Estimated Nutritional Needs:   Kcal:  1400-1600  Protein:  60-70 grams  Fluid:  1.4-1.6 L  Kathrynn Speed, MPH, RD, LDN Clinical Dietitian Contact information can be found at [facility].

## 2023-11-01 NOTE — Progress Notes (Signed)
 PT Cancellation Note  Patient Details Name: Christie Moreno MRN: [id number] DOB: Mar 14, 1956   Cancelled Treatment:    Reason Eval/Treat Not Completed: PT screened, no needs identified, will sign off (per OT and RN, mobility it good and pt to d/c home).   Lyanne Co, PT  Acute Rehab Services Secure chat preferred Office [phone]    Elyse Hsu 11/01/2023, 3:26 PM

## 2023-11-01 NOTE — Evaluation (Signed)
 Occupational Therapy Evaluation Patient Details Name: Christie Moreno MRN: [id number] DOB: [birth year] Today's Date: 11/01/2023   History of Present Illness   Christie Moreno is a 68 y.o. female with medical history significant of seizure disorder, aortic stenosis, hyperlipidemia, CAD, mesenteric ischemia status post SMA stenting in December 2024, CHF, COPD with chronic bronchitis, hypothyroidism, anxiety presented to the ED via EMS for evaluation of shortness of breath and hypoxemia in the setting of recent COVID infection     Clinical Impressions Pt is at baseline Ind - mod I with ADLs and ADL mobility, no ADs required. PTA pt lives with fiance and was Ind with ADLs, ADL mobility, home mgt, drives. Pt reports some fatiuge/SOB with minimal exertion during ADL mobility. Pt edcuated on energuy conseravtion startegies with handout provided. O2 SATs low 90s on 1L O2 and RA. All education completed and no further acute OT services are indicated at this time, OT will sign off     If plan is discharge home, recommend the following:   Assistance with cooking/housework     Functional Status Assessment   Patient has not had a recent decline in their functional status     Equipment Recommendations   None recommended by OT     Recommendations for Other Services         Precautions/Restrictions   Precautions Precautions: None Restrictions Weight Bearing Restrictions Per Provider Order: No     Mobility Bed Mobility Overal bed mobility: Independent                  Transfers Overall transfer level: Independent Equipment used: None                      Balance Overall balance assessment: No apparent balance deficits (not formally assessed)                                         ADL either performed or assessed with clinical judgement   ADL Overall ADL's : Independent;Modified independent;At baseline                                        General ADL Comments: pt reports some fatiuge/SOB with minimal exertion during ADL mobility. Pt edcuated on energuy conseravtion startegies with handout provided. O2 SATs low 90s on 1L O2 and RA     Vision Ability to See in Adequate Light: 0 Adequate Patient Visual Report: No change from baseline       Perception         Praxis         Pertinent Vitals/Pain Pain Assessment Pain Assessment: No/denies pain     Extremity/Trunk Assessment Upper Extremity Assessment Upper Extremity Assessment: Overall WFL for tasks assessed   Lower Extremity Assessment Lower Extremity Assessment: Defer to PT evaluation   Cervical / Trunk Assessment Cervical / Trunk Assessment: Normal   Communication Communication Communication: No apparent difficulties   Cognition Arousal: Alert Behavior During Therapy: WFL for tasks assessed/performed Cognition: No apparent impairments                               Following commands: Intact       Cueing  General Comments  Exercises     Shoulder Instructions      Home Living Family/patient expects to be discharged to:: Private residence Living Arrangements: Spouse/significant other Available Help at Discharge: Family;Available PRN/intermittently Type of Home: House Home Access: Stairs to enter [employer] of Steps: 4 Entrance Stairs-Rails: Right;Left;Can reach both Home Layout: One level     Bathroom Shower/Tub: Chief Strategy Officer: Standard     Home Equipment: None          Prior Functioning/Environment Prior Level of Function : Independent/Modified Independent;Driving             Mobility Comments: no AD ADLs Comments: Ind with ADLs. home mgt    OT Problem List: Decreased activity tolerance   OT Treatment/Interventions:        OT Goals(Current goals can be found in the care plan section)   Acute Rehab OT Goals Patient Stated Goal: go  home   OT Frequency:       Co-evaluation              AM-PAC OT "6 Clicks" Daily Activity     Outcome Measure Help from another person eating meals?: None Help from another person taking care of personal grooming?: None Help from another person toileting, which includes using toliet, bedpan, or urinal?: None Help from another person bathing (including washing, rinsing, drying)?: None Help from another person to put on and taking off regular upper body clothing?: None Help from another person to put on and taking off regular lower body clothing?: None 6 Click Score: 24   End of Session    Activity Tolerance: Patient tolerated treatment well Patient left: in bed;Other (comment) (sitting EOB)  OT Visit Diagnosis: Muscle weakness (generalized) (M62.81)                Time: 1610-9604 OT Time Calculation (min): 17 min Charges:  OT General Charges $OT Visit: 1 Visit OT Evaluation $OT Eval Low Complexity: 1 Low    Christie Moreno 11/01/2023, 3:30 PM

## 2023-11-01 NOTE — Progress Notes (Signed)
  Patient checking if ride can get here sooner to pick up.  Patient said Nurse reviewed AVS and has no Questions. Velia Meyer, RN

## 2023-11-01 NOTE — TOC Transition Note (Signed)
 Transition of Care [facility]) - Discharge Note   Patient Details  Name: Christie Moreno MRN: [id number] Date of Birth: [birth year]  Transition of Care [facility]) CM/SW Contact:  Tom-Johnson, Hershal Coria, RN Phone Number: 11/01/2023, 1:19 PM   Clinical Narrative:     Patient is scheduled for discharge today.  Readmission Risk Assessment done. Outpatient f/u, hospital f/u and discharge instructions on AVS. No TOC needs or recommendations noted. Family to transport at discharge.  No further TOC needs noted.          Final next level of care: Home/Self Care Barriers to Discharge: Barriers Resolved   Patient Goals and CMS Choice Patient states their goals for this hospitalization and ongoing recovery are:: To return home CMS Medicare.gov Compare Post Acute Care list provided to:: Patient Choice offered to / list presented to : NA      Discharge Placement                Patient to be transferred to facility by: Family      Discharge Plan and Services Additional resources added to the After Visit Summary for                  DME Arranged: N/A DME Agency: NA       HH Arranged: NA HH Agency: NA        Social Drivers of Health (SDOH) Interventions SDOH Screenings   Food Insecurity: No Food Insecurity (10/28/2023)  Housing: Low Risk  (10/28/2023)  Transportation Needs: No Transportation Needs (10/28/2023)  Utilities: Not At Risk (10/28/2023)  Alcohol Screen: Low Risk  (05/04/2023)  Depression (PHQ2-9): Low Risk  (08/30/2023)  Recent Concern: Depression (PHQ2-9) - Medium Risk (08/25/2023)  Financial Resource Strain: Medium Risk (05/04/2023)  Physical Activity: Inactive (03/17/2023)  Social Connections: Socially Isolated (10/28/2023)  Stress: Stress Concern Present (05/04/2023)  Tobacco Use: High Risk (10/29/2023)  Health Literacy: Adequate Health Literacy (03/17/2023)     Readmission Risk Interventions    11/01/2023    1:17 PM 07/22/2023    3:18 PM  Readmission  Risk Prevention Plan  Post Dischage Appt  Complete  Medication Screening  Complete  Transportation Screening Complete Complete  PCP or Specialist Appt within 3-5 Days Complete   HRI or Home Care Consult Complete   Social Work Consult for Recovery Care Planning/Counseling Complete   Palliative Care Screening Not Applicable   Medication Review Oceanographer) Referral to Pharmacy

## 2023-11-01 NOTE — Plan of Care (Signed)
  Problem: Respiratory: Goal: Will maintain a patent airway Outcome: Progressing   Problem: Nutrition: Goal: Adequate nutrition will be maintained Outcome: Progressing   Problem: Safety: Goal: Ability to remain free from injury will improve Outcome: Progressing   Problem: Clinical Measurements: Goal: Respiratory complications will improve Outcome: Progressing

## 2023-11-01 NOTE — Plan of Care (Signed)

## 2023-11-01 NOTE — Discharge Summary (Signed)
 Physician Discharge Summary   Patient: Christie Moreno MRN: [id number] DOB: 08/06/1955  Admit date:     10/27/2023  Discharge date: 11/01/23  Discharge Physician: Kendell Bane   PCP: Melida Quitter, PA   Recommendations at discharge:    Follow-up with PCP in 1 week Anticipating blood sugar to transiently run high on steroids Strict carb modified diabetic diet -Continue current recommended medications-change and modified by PCP  Discharge Diagnoses: Principal Problem:   COPD with acute exacerbation (HCC) Active Problems:   Hyponatremia   Seizure disorder (HCC)   COVID-19   Acute hypoxemic respiratory failure (HCC)   Hypoglycemia   (HFpEF) heart failure with preserved ejection fraction (HCC)  Resolved Problems:   * No resolved hospital problems. [facility] Course: Patient is a 68 year old female, cachectic, past medical history significant for seizure disorder, aortic stenosis, hyperlipidemia, coronary artery disease, mesenteric ischemia status post SMA stenting in December 2024, CHF, COPD, chronic bronchitis, hypothyroidism and anxiety.  Patient was admitted with shortness of breath.  Workup revealed COPD exacerbation and positive COVID 19 infection.  Patient is on Paxlovid.  Patient is also on IV Solu-Medrol and nebulizer treatment.   10/31/2023: COPD exacerbation is slowly improving.  Continue current regimen.  Complete course of Paxlovid.  Patient is currently on Lamictal for seizure, and on Paxlovid for COVID infection.  Keppra started as Paxlovid can decrease Lamictal level.  Need for Keppra explained to patient extensively.  Likely discharge when COPD is optimized.  Acute COPD exacerbation in the setting of COVID infection Acute hypoxemic respiratory failure -Resolved, was tapered off supplemental oxygen, currently on room air satting 99% Continue incentive spirometer, continue home inhalers-Taper down steroids   Mild hypoglycemia -resolved Likely due to poor  p.o. intake Proceed with caloric count.  Resume Jardiance and encourage p.o. intake.   Mild hyponatremia -Chronic improved   HFpEF Mitral and aortic valve insufficiency Last echo done in August 2024 showing EF 60-65%, grade 1 diastolic function, mild to moderate mitral regurgitation, mild mitral stenosis, moderate to severe aortic regurgitation, and mild aortic stenosis.  No signs of volume overload at this time.   Comorbidities: Seizure disorder, CAD, hypothyroidism, anxiety, chronic cachexia, HTN  -Reviewed home medication reviewed, to be continued With exception of BP meds modified -Home medication losartan discontinued   Epigastric pain: -Continue PPI, and switch Carafate to Maalox        Disposition: Home Diet recommendation:  Discharge Diet Orders (From admission, onward)     Start     Ordered   11/01/23 0000  Diet - low sodium heart healthy        11/01/23 1110           Regular diet DISCHARGE MEDICATION: Allergies as of 11/01/2023       Reactions   Azithromycin Nausea And Vomiting   Penicillins Itching, Rash   Percocet [oxycodone-acetaminophen] Rash        Medication List     STOP taking these medications    losartan 50 MG tablet Commonly known as: COZAAR       TAKE these medications    albuterol (2.5 MG/3ML) 0.083% nebulizer solution Commonly known as: PROVENTIL Take 3 mLs (2.5 mg total) by nebulization every 6 (six) hours as needed for wheezing or shortness of breath.   albuterol 108 (90 Base) MCG/ACT inhaler Commonly known as: VENTOLIN HFA Inhale 2 puffs into the lungs every 4 (four) hours as needed for wheezing or shortness of breath.   Aspirin Low  Dose 81 MG tablet Generic drug: aspirin EC Take 1 tablet (81 mg total) by mouth daily. Swallow whole.   atorvastatin 80 MG tablet Commonly known as: LIPITOR TAKE 1 TABLET DAILY AT 6 P.M. (KEEP UPCOMING APPOINTMENT FOR FUTURE REFILLS What changed: See the new instructions.   carvedilol  3.125 MG tablet Commonly known as: COREG TAKE 1 TABLET(3.125 MG) BY MOUTH TWICE DAILY WITH A MEAL What changed: See the new instructions.   clopidogrel 75 MG tablet Commonly known as: PLAVIX Take 1 tablet (75 mg total) by mouth daily.   estrogens (conjugated) 1.25 MG tablet Commonly known as: PREMARIN Take 1.25 mg by mouth daily.   feeding supplement Liqd Take 237 mLs by mouth 2 (two) times daily between meals.   furosemide 40 MG tablet Commonly known as: LASIX Take 0.5 tablets (20 mg total) by mouth daily. TAKE ONE-HALF (1/2) TABLET (20 MG TOTAL) DAILY What changed:  how much to take when to take this additional instructions   guaiFENesin 600 MG 12 hr tablet Commonly known as: MUCINEX Take 1 tablet (600 mg total) by mouth 2 (two) times daily for 5 days.   Jardiance 10 MG Tabs tablet Generic drug: empagliflozin TAKE 1 TABLET DAILY   lamoTRIgine 100 MG tablet Commonly known as: LAMICTAL Take 1 tablet (100 mg total) by mouth 2 (two) times daily.   levothyroxine 25 MCG tablet Commonly known as: SYNTHROID Take 1 tablet (25 mcg total) by mouth daily before breakfast.   Maalox Max 400-400-40 MG/5ML suspension Generic drug: alum & mag hydroxide-simeth Take 10 mLs by mouth every 6 (six) hours as needed for indigestion.   menthol-cetylpyridinium 3 MG lozenge Commonly known as: CEPACOL Take 1 lozenge (3 mg total) by mouth as needed for sore throat.   methylPREDNISolone 4 MG Tbpk tablet Commonly known as: MEDROL DOSEPAK Medrol Dosepak take as instructed   nicotine 10 MG inhaler Commonly known as: NICOTROL Inhale 1 Cartridge (1 continuous puffing total) into the lungs as needed for smoking cessation.   nirmatrelvir/ritonavir 20 x 150 MG & 10 x 100MG  Tabs Commonly known as: PAXLOVID Take 3 tablets by mouth 2 (two) times daily. Patient GFR is 60 . Take nirmatrelvir (150 mg) two tablets twice daily for 5 days and ritonavir (100 mg) one tablet twice daily for 5 days.    pantoprazole 40 MG tablet Commonly known as: PROTONIX Take 1 tablet (40 mg total) by mouth daily. Start taking on: November 02, 2023   potassium chloride SA 20 MEQ tablet Commonly known as: KLOR-CON M TAKE 1 TABLET DAILY (KEEP UPCOMING APPOINTMENT FOR FUTURE REFILLS) What changed: See the new instructions.   progesterone 200 MG capsule Commonly known as: PROMETRIUM Take 200 mg by mouth at bedtime.        Discharge Exam: Filed Weights   10/28/23 0050 10/29/23 0505  Weight: 35.8 kg 35.8 kg        General:  AAO x 3,  cooperative, no distress;   HEENT:  Normocephalic, PERRL, otherwise with in Normal limits   Neuro:  CNII-XII intact. , normal motor and sensation, reflexes intact   Lungs:   Clear to auscultation BL, Respirations unlabored,  No wheezes / crackles  Cardio:    S1/S2, RRR, No murmure, No Rubs or Gallops   Abdomen:  Soft, non-tender, bowel sounds active all four quadrants, no guarding or peritoneal signs.  Muscular  skeletal:  Limited exam -global generalized weaknesses - in bed, able to move all 4 extremities,   2+ pulses,  symmetric, No pitting edema  Skin:  Dry, warm to touch, negative for any Rashes,  Wounds: Please see nursing documentation  Pressure Injury 07/21/23 Sacrum Stage 1 -  Intact skin with non-blanchable redness of a localized area usually over a bony prominence. (Active)  07/21/23 1700  Location: Sacrum  Location Orientation:   Staging: Stage 1 -  Intact skin with non-blanchable redness of a localized area usually over a bony prominence.  Wound Description (Comments):   Present on Admission:           Condition at discharge: good  The results of significant diagnostics from this hospitalization (including imaging, microbiology, ancillary and laboratory) are listed below for reference.   Imaging Studies: DG Chest 1 View Result Date: 10/27/2023 CLINICAL DATA:  Shortness of breath. Diagnosed with COVID 2 days ago. Shortness of breath for  1 day. No relief on inhaler use. Decreased oxygen saturation. EXAM: CHEST  1 VIEW COMPARISON:  10/24/2023 FINDINGS: Normal heart size and pulmonary vascularity. Prominent emphysematous changes in the lungs. No airspace disease or consolidation. No pleural effusion or pneumothorax. Mediastinal contours appear intact. IMPRESSION: Severe emphysematous changes in the lungs.  No focal consolidation. Electronically Signed   By: Burman Nieves M.D.   On: 10/27/2023 20:31   DG Chest 1 View Result Date: 10/24/2023 CLINICAL DATA:  Shortness of breath. EXAM: CHEST  1 VIEW COMPARISON:  07/31/2023. FINDINGS: The heart size and mediastinal contours are within normal limits. Emphysematous changes are noted in the lungs. No consolidation, effusion, or pneumothorax. No acute osseous abnormality. IMPRESSION: 1. No active disease. 2. Emphysema. Electronically Signed   By: Thornell Sartorius M.D.   On: 10/24/2023 19:55    Microbiology: Results for orders placed or performed during the hospital encounter of 10/24/23  Resp panel by RT-PCR (RSV, Flu A&B, Covid) Anterior Nasal Swab     Status: Abnormal   Collection Time: 10/24/23 10:53 PM   Specimen: Anterior Nasal Swab  Result Value Ref Range Status   SARS Coronavirus 2 by RT PCR POSITIVE (A) NEGATIVE Final   Influenza A by PCR NEGATIVE NEGATIVE Final   Influenza B by PCR NEGATIVE NEGATIVE Final    Comment: (NOTE) The Xpert Xpress SARS-CoV-2/FLU/RSV plus assay is intended as an aid in the diagnosis of influenza from Nasopharyngeal swab specimens and should not be used as a sole basis for treatment. Nasal washings and aspirates are unacceptable for Xpert Xpress SARS-CoV-2/FLU/RSV testing.  Fact Sheet for Patients: BloggerCourse.com  Fact Sheet for Healthcare Providers: SeriousBroker.it  This test is not yet approved or cleared by the [place] FDA and has been authorized for detection and/or diagnosis of  SARS-CoV-2 by FDA under an Emergency Use Authorization (EUA). This EUA will remain in effect (meaning this test can be used) for the duration of the COVID-19 declaration under Section 564(b)(1) of the Act, 21 U.S.C. section 360bbb-3(b)(1), unless the authorization is terminated or revoked.     Resp Syncytial Virus by PCR NEGATIVE NEGATIVE Final    Comment: (NOTE) Fact Sheet for Patients: BloggerCourse.com  Fact Sheet for Healthcare Providers: SeriousBroker.it  This test is not yet approved or cleared by the [place] FDA and has been authorized for detection and/or diagnosis of SARS-CoV-2 by FDA under an Emergency Use Authorization (EUA). This EUA will remain in effect (meaning this test can be used) for the duration of the COVID-19 declaration under Section 564(b)(1) of the Act, 21 U.S.C. section 360bbb-3(b)(1), unless the authorization is terminated or revoked.  Performed at [facility], [address] [place], [place] [address]     Labs: CBC: Recent Labs  Lab 10/27/23 1940 10/29/23 0354  WBC 4.8 11.0*  NEUTROABS 2.7 10.2*  HGB 11.5* 10.2*  HCT 37.0 31.4*  MCV 84.1 81.8  PLT 228 236   Basic Metabolic Panel: Recent Labs  Lab 10/27/23 1940 10/28/23 0209 10/29/23 0354  NA 133* 135 136  K 3.9 3.7 3.9  CL 100 102 104  CO2 22 23 23   GLUCOSE 64* 236* 181*  BUN 20 21 19   CREATININE 0.92 0.99 0.95  CALCIUM 8.7* 8.2* 8.5*  MG  --   --  2.1  PHOS  --   --  3.5   Liver Function Tests: Recent Labs  Lab 10/29/23 0354  ALBUMIN 2.5*   CBG: Recent Labs  Lab 10/31/23 1139 10/31/23 1635 11/01/23 0021 11/01/23 0616 11/01/23 1151  GLUCAP 117* 155* 194* 105* 159*    Discharge time spent: greater than 30 minutes.  Signed: Kendell Bane, MD [facility] 11/01/2023

## 2023-11-01 NOTE — Hospital Course (Signed)
 Patient is a 68 year old female, cachectic, past medical history significant for seizure disorder, aortic stenosis, hyperlipidemia, coronary artery disease, mesenteric ischemia status post SMA stenting in December 2024, CHF, COPD, chronic bronchitis, hypothyroidism and anxiety.  Patient was admitted with shortness of breath.  Workup revealed COPD exacerbation and positive COVID 19 infection.  Patient is on Paxlovid.  Patient is also on IV Solu-Medrol and nebulizer treatment.   10/31/2023: COPD exacerbation is slowly improving.  Continue current regimen.  Complete course of Paxlovid.  Patient is currently on Lamictal for seizure, and on Paxlovid for COVID infection.  Keppra started as Paxlovid can decrease Lamictal level.  Need for Keppra explained to patient extensively.  Likely discharge when COPD is optimized.

## 2023-11-02 ENCOUNTER — Telehealth: Payer: Self-pay

## 2023-11-02 NOTE — Transitions of Care (Post Inpatient/ED Visit) (Signed)
   11/02/2023  Name: Christie Moreno MRN: [id number] DOB: 01-13-1956  Today's TOC FU Call Status: Today's TOC FU Call Status:: Unsuccessful Call (1st Attempt) Unsuccessful Call (1st Attempt) Date: 11/02/23  Attempted to reach the patient regarding the most recent Inpatient/ED visit.  Follow Up Plan: Additional outreach attempts will be made to reach the patient to complete the Transitions of Care (Post Inpatient/ED visit) call.   Lonia Chimera, RN, BSN, CEN [employer]- Transition of Care Team.  [facility] [phone]

## 2023-11-03 ENCOUNTER — Telehealth: Payer: Self-pay

## 2023-11-03 NOTE — Transitions of Care (Post Inpatient/ED Visit) (Signed)
 11/03/2023  Name: Christie Moreno MRN: [id number] DOB: 06-Apr-1956  Today's TOC FU Call Status: Today's TOC FU Call Status:: Successful TOC FU Call Completed TOC FU Call Complete Date: 11/03/23 Patient's Name and Date of Birth confirmed.  Transition Care Management Follow-up Telephone Call How have you been since you were released from the hospital?: Better (breathing is better.  Continues to have cough, Using inhaler and nebs at home) Any questions or concerns?: No  Items Reviewed: Did you receive and understand the discharge instructions provided?: Yes Medications obtained,verified, and reconciled?: Yes (Medications Reviewed) Any new allergies since your discharge?: No Dietary orders reviewed?: Yes Type of Diet Ordered:: low sodium heart healthy Do you have support at home?: Yes People in Home: significant other Name of Support/Comfort Primary Source: Christie Moreno  Medications Reviewed Today: Medications Reviewed Today     Reviewed by Christie Server, RN (Registered Nurse) on 11/03/23 at 1219  Med List Status: <None>   Medication Order Taking? Sig Documenting Provider Last Dose Status Informant  albuterol (PROVENTIL) (2.5 MG/3ML) 0.083% nebulizer solution [id number] Yes Take 3 mLs (2.5 mg total) by nebulization every 6 (six) hours as needed for wheezing or shortness of breath.  Patient taking differently: Take 3 mLs (2.5 mg total) by nebulization every 6 (six) hours as needed for wheezing or shortness of breath.   Christie Moreno, Christie Ram, Christie Moreno Taking Active Self, Pharmacy Records, Multiple Informants  albuterol (VENTOLIN HFA) 108 (90 Base) MCG/ACT inhaler [id number] Yes Inhale 2 puffs into the lungs every 4 (four) hours as needed for wheezing or shortness of breath. Christie Moreno, Christie Masker, Christie Moreno Taking Active Self, Pharmacy Records, Multiple Informants  alum & mag hydroxide-simeth (MAALOX MAX) 400-400-40 MG/5ML suspension [id number] No Take 10 mLs by mouth every 6 (six) hours as needed for  indigestion.  Patient not taking: Reported on 11/03/2023   Christie Bane, Christie Moreno Not Taking Active   aspirin 81 MG EC tablet [id number] Yes Take 1 tablet (81 mg total) by mouth daily. Swallow whole. Christie Moreno, Christie Ram, Christie Moreno Taking Active Self, Pharmacy Records, Multiple Informants  atorvastatin (LIPITOR) 80 MG tablet [id number] Yes TAKE 1 TABLET DAILY AT 6 P.M. (KEEP UPCOMING APPOINTMENT FOR FUTURE REFILLS  Patient taking differently: Take 80 mg by mouth daily.   Christie Gess, Christie Moreno Taking Active Self, Pharmacy Records, Multiple Informants  carvedilol (COREG) 3.125 MG tablet [id number] Yes TAKE 1 TABLET(3.125 MG) BY MOUTH TWICE DAILY WITH A MEAL  Patient taking differently: Take 3.125 mg by mouth 2 (two) times daily with a meal.   Christie Gess, Christie Moreno Taking Active Self, Pharmacy Records, Multiple Informants  clopidogrel (PLAVIX) 75 MG tablet [id number] Yes Take 1 tablet (75 mg total) by mouth daily. Christie Art, DO Taking Active Self, Pharmacy Records, Multiple Informants  estrogens, conjugated, (PREMARIN) 1.25 MG tablet [id number] Yes Take 1.25 mg by mouth daily. Provider, Historical, Christie Moreno Taking Active Self, Pharmacy Records, Multiple Informants           Med Note Christie Moreno, Christie Moreno   Mon Nov 02, 2021  5:03 AM)    feeding supplement (ENSURE ENLIVE / ENSURE PLUS) LIQD [id number] No Take 237 mLs by mouth 2 (two) times daily between meals.  Patient not taking: Reported on 11/03/2023   Christie Moreno, Christie Ram, Christie Moreno Not Taking Active Self, Pharmacy Records, Multiple Informants           Med Note Christie Moreno, Christie Moreno   Fri Oct 28, 2023  9:19 AM) Pt admits she is not drinking it as  much as she should   furosemide (LASIX) 40 MG tablet [id number] Yes Take 0.5 tablets (20 mg total) by mouth daily. TAKE ONE-HALF (1/2) TABLET (20 MG TOTAL) DAILY  Patient taking differently: Take 20-40 mg by mouth See admin instructions. Take an additional half tablet 20 mg if needed for swelling   Christie Gess, Christie Moreno Taking  Active Self, Pharmacy Records, Multiple Informants           Med Note Christie Moreno, Christie Moreno   Fri Oct 28, 2023  9:20 AM)    guaiFENesin (MUCINEX) 600 MG 12 hr tablet [id number] Yes Take 1 tablet (600 mg total) by mouth 2 (two) times daily for 5 days. Christie Bane, Christie Moreno Taking Active   JARDIANCE 10 MG TABS tablet [id number] Yes TAKE 1 TABLET DAILY Christie Gess, Christie Moreno Taking Active Self, Pharmacy Records, Multiple Informants  lamoTRIgine (LAMICTAL) 100 MG tablet [id number] Yes Take 1 tablet (100 mg total) by mouth 2 (two) times daily. Christie Salvo, Christie Moreno Taking Active Self, Pharmacy Records, Multiple Informants  levothyroxine (SYNTHROID) 25 MCG tablet [id number] Yes Take 1 tablet (25 mcg total) by mouth daily before breakfast. Christie Quitter, Christie Moreno Taking Active Self, Pharmacy Records, Multiple Informants  menthol-cetylpyridinium (CEPACOL) 3 MG lozenge [id number] No Take 1 lozenge (3 mg total) by mouth as needed for sore throat.  Patient not taking: Reported on 11/03/2023   Christie Bane, Christie Moreno Not Taking Active   methylPREDNISolone (MEDROL DOSEPAK) 4 MG TBPK tablet [id number] Yes Medrol Dosepak take as instructed Christie Bane, Christie Moreno Taking Active   nicotine (NICOTROL) 10 MG inhaler [id number] No Inhale 1 Cartridge (1 continuous puffing total) into the lungs as needed for smoking cessation.  Patient not taking: Reported on 10/28/2023   Christie Kitty, Christie Moreno Not Taking Active Self, Pharmacy Records, Multiple Informants  nirmatrelvir/ritonavir (PAXLOVID) 20 x 150 MG & 10 x 100MG  TABS [id number]  Take 3 tablets by mouth 2 (two) times daily. Patient GFR is 60 . Take nirmatrelvir (150 mg) two tablets twice daily for 5 days and ritonavir (100 mg) one tablet twice daily for 5 days. Christie Bane, Christie Moreno  Active            Med Note ([name], Christie Moreno   Thu Nov 03, 2023 12:15 PM) Finished per patient  pantoprazole (PROTONIX) 40 MG tablet [id number] Yes Take 1 tablet (40 mg total) by mouth daily. Christie Bane,  Christie Moreno Taking Active   potassium chloride SA (KLOR-CON M) 20 MEQ tablet [id number] Yes TAKE 1 TABLET DAILY (KEEP UPCOMING APPOINTMENT FOR FUTURE REFILLS)  Patient taking differently: Take 20 mEq by mouth daily.   Christie Parker, Christie Moreno-C Taking Active Self, Pharmacy Records, Multiple Informants  progesterone (PROMETRIUM) 200 MG capsule [id number] Yes Take 200 mg by mouth at bedtime. Provider, Historical, Christie Moreno Taking Active Self, Pharmacy Records, Multiple Informants            Home Care and Equipment/Supplies: Were Home Health Services Ordered?: No Any new equipment or medical supplies ordered?: No  Functional Questionnaire: Do you need assistance with bathing/showering or dressing?: Yes (friend assist with bathing) Do you need assistance with meal preparation?: No Do you need assistance with eating?: No Do you have difficulty maintaining continence: No Do you need assistance with getting out of bed/getting out of a chair/moving?: No Do you have difficulty managing or taking your medications?: No  Follow up appointments reviewed: PCP Follow-up appointment confirmed?: No (patient to call today) Christie Moreno Provider Line Number:[phone] Given: No  Specialist Hospital Follow-up appointment confirmed?: Yes Date of Specialist follow-up appointment?: 01/06/24 Follow-Up Specialty Provider:: Vein and Vascular Do you need transportation to your follow-up appointment?: No Do you understand care options if your condition(s) worsen?: Yes-patient verbalized understanding  SDOH Interventions Today    Flowsheet Row Most Recent Value  SDOH Interventions   Food Insecurity Interventions Intervention Not Indicated  Housing Interventions Intervention Not Indicated  Transportation Interventions Intervention Not Indicated  Utilities Interventions Intervention Not Indicated      Interventions Today    Flowsheet Row Most Recent Value  Chronic Disease   Chronic disease during today's visit Congestive Heart  Failure (CHF), Other  [covid]  General Interventions   General Interventions Discussed/Reviewed General Interventions Discussed, General Interventions Reviewed, Doctor Visits  Doctor Visits Discussed/Reviewed Doctor Visits Discussed, Doctor Visits Reviewed, PCP, Specialist  PCP/Specialist Visits Compliance with follow-up visit  [encouraged patient to call and make an appointment for PCP follow up in 1 week, Confirmed her friend Christie Moreno takes her to her appointments.]  Education Interventions   Education Provided Provided Education  [reviewed importance of good nutrition. Encouraged patient to drink her supplements.]  Provided Verbal Education On Medication, When to see the doctor, Nutrition  Nutrition Interventions   Nutrition Discussed/Reviewed Nutrition Discussed, Carbohydrate meal planning  Pharmacy Interventions   Pharmacy Dicussed/Reviewed Medications and their functions      TOC Interventions Today    Flowsheet Row Most Recent Value  TOC Interventions   TOC Interventions Discussed/Reviewed TOC Interventions Discussed, TOC Interventions Reviewed, S/S of infection, Post discharge activity limitations per provider      Patient reports that she is doing well. Reports she continues to have a cough. Reports using her inhalers and nebs at home.  Taking all medications as prescribed. Reviewed importance of follow up with PCP and patient will call today to make an appointment.  Patient is weighing daily and knows when to call Christie Moreno for weight gain.   Reviewed 30 day TOC program and reviewed with patient her assigned Complex Case Manager Juanell Fairly.  At this time patient request to continue to work with her case Production designer, theatre/television/film and has declined [employer] program. Message sent to [place]. Also provided my contact information if patient changes her mind.   Lonia Chimera, RN, BSN, CEN [employer]- Transition of Care Team.  [facility] [phone]

## 2023-11-09 ENCOUNTER — Ambulatory Visit: Admitting: Family Medicine

## 2023-11-09 ENCOUNTER — Encounter: Payer: Self-pay | Admitting: Family Medicine

## 2023-11-09 ENCOUNTER — Telehealth: Payer: Self-pay | Admitting: *Deleted

## 2023-11-09 VITALS — BP 137/76 | HR 72 | Ht 61.0 in | Wt 77.8 lb

## 2023-11-09 DIAGNOSIS — E43 Unspecified severe protein-calorie malnutrition: Secondary | ICD-10-CM | POA: Diagnosis not present

## 2023-11-09 DIAGNOSIS — J441 Chronic obstructive pulmonary disease with (acute) exacerbation: Secondary | ICD-10-CM | POA: Diagnosis not present

## 2023-11-09 MED ORDER — ALBUTEROL SULFATE (2.5 MG/3ML) 0.083% IN NEBU: 2.5000 mg | INHALATION_SOLUTION | Freq: Four times a day (QID) | RESPIRATORY_TRACT | 12 refills | Status: AC | PRN

## 2023-11-09 MED ORDER — BREZTRI AEROSPHERE 160-9-4.8 MCG/ACT IN AERO
2.0000 | INHALATION_SPRAY | Freq: Two times a day (BID) | RESPIRATORY_TRACT | 11 refills | Status: AC
Start: 2023-11-09 — End: ?

## 2023-11-09 MED ORDER — NICOTINE POLACRILEX 2 MG MT GUM: 2.0000 mg | CHEWING_GUM | OROMUCOSAL | 0 refills | Status: DC | PRN

## 2023-11-09 NOTE — Progress Notes (Unsigned)
   Established Patient Office Visit  Subjective   Patient ID: Christie Moreno, female    DOB: 08-May-1956  Age: 68 y.o. MRN: [id number]  No chief complaint on file.   HPI  Hospital follow-up-patient here for follow-up of recent COVID-positive COPD exacerbation.  She is not taking any of the medications prescribed at discharge.  She has finished those.  She is taking her previous medications with the exception of losartan which she was told to hold.  She does not have a maintenance inhaler.  She uses albuterol inhaler and nebulizer as needed.  She smokes 2 to 3 cigarettes/day.  Her nicotine inhaler was not covered.  We discussed other nicotine options and she would like to use the gum.  We discussed the patient's low albumin and malnutrition.  We discussed using protein supplements and nutritional shakes as needed up to 3 times per day.    The ASCVD Risk score (Arnett DK, et al., 2019) failed to calculate for the following reasons:   Risk score cannot be calculated because patient has a medical history suggesting prior/existing ASCVD  Health Maintenance Due  Topic Date Due   Pneumonia Vaccine 49+ Years old (1 of 2 - PCV) Never done   DTaP/Tdap/Td (1 - Tdap) Never done   Zoster Vaccines- Shingrix (1 of 2) Never done   Lung Cancer Screening  11/03/2022   COVID-19 Vaccine (3 - 2024-25 season) 04/03/2023      Objective:     There were no vitals taken for this visit. {Vitals History (Optional):23777}  Physical Exam General: Alert, oriented.  Frail. CV: Regular rhythm Pulmonary: No wheezes.  No respiratory distress MSK: [place]   No results found for any visits on 11/09/23.      Assessment & Plan:   There are no diagnoses linked to this encounter.   No follow-ups on file.    Sandre Kitty, MD

## 2023-11-09 NOTE — Progress Notes (Signed)
 Complex Care Management Note Care Guide Note  11/09/2023 Name: Christie Moreno MRN: [id number] DOB: 1956/07/11   Complex Care Management Outreach Attempts: An unsuccessful telephone outreach was attempted today to offer the patient information about available complex care management services.  Follow Up Plan:  Additional outreach attempts will be made to offer the patient complex care management information and services.   Encounter Outcome:  No Answer  Burman Nieves, CMA [place]  [facility], [facility] Guide Direct Dial: [phone]  Fax: [phone] Website: [email or web address]

## 2023-11-09 NOTE — Patient Instructions (Addendum)
 It was nice to see you today,  We addressed the following topics today: -Continue to hold your losartan.  Your blood pressure was okay today. - I have prescribed Breztri maintenance inhaler.  This medicine combines 3 different inhalers.  Use this 2 puffs twice a day.  You can still use your albuterol as needed. - I have sent in a nebulizer albuterol solution for you. - I have sent in nicotine gum for you.  You can also call 1 800 quit now and they can send you a month supply of nicotine replacement - Follow-up with [place] in 3 months. - You can drink your nutritional supplement drinks 3 times a day.  This will help with your albumin levels and increasing muscle mass.  Have a great day,  Frederic Jericho, MD

## 2023-11-10 NOTE — Progress Notes (Signed)
 Complex Care Management Note  Care Guide Note 11/10/2023 Name: ZILDA NO MRN: [id number] DOB: 23-Mar-1956  [name] is a 68 y.o. year old female who sees Melida Quitter, [place] for primary care. I reached out to [name] by phone today to offer complex care management services.  Ms. Irigoyen was given information about Complex Care Management services today including:   The Complex Care Management services include support from the care team which includes your Nurse Care Manager, Clinical Social Worker, or Pharmacist.  The Complex Care Management team is here to help remove barriers to the health concerns and goals most important to you. Complex Care Management services are voluntary, and the patient may decline or stop services at any time by request to their care team member.   Complex Care Management Consent Status: Patient agreed to services and verbal consent obtained.   Follow up plan:  Telephone appointment with complex care management team member scheduled for:  4/24/ 2025  Encounter Outcome:  Patient Scheduled  Burman Nieves, CMA [place]  [facility], [facility] Guide Direct Dial: [phone]  Fax: [phone] Website: [email or web address]

## 2023-11-11 NOTE — Assessment & Plan Note (Signed)
 BMI of 14.  Encourage patient to increase caloric and protein intake with nutritional supplements as much as she can tolerate

## 2023-11-11 NOTE — Assessment & Plan Note (Signed)
 Doing better since discharge.  No complaints currently.  Has finished all medication that was prescribed at discharge.  Has no maintenance inhaler.  Prescribing Breztri.  Prescribing nicotine gum to help with smoking cessation

## 2023-11-16 ENCOUNTER — Ambulatory Visit: Payer: Self-pay

## 2023-11-16 NOTE — Patient Outreach (Addendum)
 Complex Care Management   Visit Note  11/16/2023  Name:  Christie Moreno MRN: [id number] DOB: March 30, 1956  Situation: Referral received for Complex Care Management related to COPD I obtained verbal consent from Patient.  Visit completed with patient  on the phone  Background:   Past Medical History:  Diagnosis Date   Complex partial seizure (HCC) 04/22/2015   Convulsions (HCC) 04/19/2013   Heart failure (HCC)    Seizures (HCC)    none in years, recently had medication change    Assessment:  Christie Moreno has expressed concerns regarding her daughter, who is currently hospitalized. The patient was recently admitted for a chronic obstructive pulmonary disease (COPD) exacerbation on October 28, 2023, and was discharged on November 01, 2023. Christie Moreno reported that she continues to smoke three cigarettes daily. Her physician is in the process of prescribing medication for smoking cessation; however, the patient's insurance did not approve the previous prescription. Christie Moreno also mentioned that she is demonstrating some improvement and continues to experience a productive cough. The patient experiences relief by expectorating sputum.  Patient Reported Symptoms:  Cognitive Cognitive Status: Able to follow simple commands, Alert and oriented to person, place, and time, Insightful and able to interpret abstract concepts      Neurological      HEENT HEENT Symptoms Reported: No symptoms reported      Cardiovascular      Respiratory Respiratory Symptoms Reported: Shortness of breath, Productive cough Respiratory Conditions: COPD Respiratory Self-Management Outcome: 3 (uncertain)  Endocrine Patient reports the following symptoms related to hypoglycemia or hyperglycemia : No symptoms reported Is patient diabetic?: No    Gastrointestinal Gastrointestinal Symptoms Reported: No symptoms reported      Genitourinary      Integumentary Integumentary Symptoms Reported: No symptoms  reported    Musculoskeletal Musculoskelatal Symptoms Reviewed: No symptoms reported        Psychosocial Psychosocial Symptoms Reported: No symptoms reported            11/16/2023   11:29 AM  Depression screen PHQ 2/9  Decreased Interest 1  Down, Depressed, Hopeless 0  PHQ - 2 Score 1  Altered sleeping 0  Tired, decreased energy 1  Change in appetite 0  Feeling bad or failure about yourself  1  Trouble concentrating 0  Moving slowly or fidgety/restless 0  Suicidal thoughts 0  PHQ-9 Score 3  Difficult doing work/chores Not difficult at all    There were no vitals filed for this visit.  Medications Reviewed Today     Reviewed by Augustin Leber, RN (Registered Nurse) on 11/16/23 at 1120  Med List Status: <None>   Medication Order Taking? Sig Documenting Provider Last Dose Status Informant  albuterol (PROVENTIL) (2.5 MG/3ML) 0.083% nebulizer solution [id number] Yes Take 3 mLs (2.5 mg total) by nebulization every 6 (six) hours as needed for wheezing or shortness of breath. Laneta Pintos, MD Taking Active   albuterol (VENTOLIN HFA) 108 (90 Base) MCG/ACT inhaler [id number] Yes Inhale 2 puffs into the lungs every 4 (four) hours as needed for wheezing or shortness of breath. Horton, Vonzella Guernsey, MD Taking Active Self, Pharmacy Records, Multiple Informants  aspirin 81 MG EC tablet [id number] Yes Take 1 tablet (81 mg total) by mouth daily. Swallow whole. Arrien, Curlee Doss, MD Taking Active Self, Pharmacy Records, Multiple Informants  atorvastatin (LIPITOR) 80 MG tablet [id number] Yes TAKE 1 TABLET DAILY AT 6 P.M. (KEEP UPCOMING APPOINTMENT FOR FUTURE REFILLS  Patient taking differently: Take 80 mg by  mouth daily.   Avanell Leigh, MD Taking Active Self, Pharmacy Records, Multiple Informants  budeson-glycopyrrolate-formoterol (BREZTRI AEROSPHERE) 160-9-4.8 MCG/ACT AERO inhaler [id number] Yes Inhale 2 puffs into the lungs 2 (two) times daily. Laneta Pintos, MD Taking Active    carvedilol (COREG) 3.125 MG tablet [id number] Yes TAKE 1 TABLET(3.125 MG) BY MOUTH TWICE DAILY WITH A MEAL  Patient taking differently: Take 3.125 mg by mouth 2 (two) times daily with a meal.   Avanell Leigh, MD Taking Active Self, Pharmacy Records, Multiple Informants  clopidogrel (PLAVIX) 75 MG tablet [id number] Yes Take 1 tablet (75 mg total) by mouth daily. Vann, Jessica U, DO Taking Active Self, Pharmacy Records, Multiple Informants  estrogens, conjugated, (PREMARIN) 1.25 MG tablet [id number] Yes Take 1.25 mg by mouth daily. [provider] Taking Active Self, Pharmacy Records, Multiple Informants           Med Note Del Favia, [name]   Mon Nov 02, 2021  5:03 AM)    feeding supplement (ENSURE ENLIVE / ENSURE PLUS) LIQD [id number] Yes Take 237 mLs by mouth 2 (two) times daily between meals. Arrien, Curlee Doss, MD Taking Active Self, Pharmacy Records, Multiple Informants           Med Note Baltazar Leventhal, Alethia Huxley   Fri Oct 28, 2023  9:19 AM) Pt admits she is not drinking it as much as she should   furosemide (LASIX) 40 MG tablet [id number] Yes Take 0.5 tablets (20 mg total) by mouth daily. TAKE ONE-HALF (1/2) TABLET (20 MG TOTAL) DAILY  Patient taking differently: Take 20-40 mg by mouth See admin instructions. Take an additional half tablet 20 mg if needed for swelling   Avanell Leigh, MD Taking Active Self, Pharmacy Records, Multiple Informants           Med Note Alline Ivans   Fri Oct 28, 2023  9:20 AM)    JARDIANCE 10 MG TABS tablet [id number] Yes TAKE 1 TABLET DAILY Avanell Leigh, MD Taking Active Self, Pharmacy Records, Multiple Informants  lamoTRIgine (LAMICTAL) 100 MG tablet [id number] Yes Take 1 tablet (100 mg total) by mouth 2 (two) times daily. Wess Hammed, NP Taking Active Self, Pharmacy Records, Multiple Informants  levothyroxine (SYNTHROID) 25 MCG tablet [id number] Yes Take 1 tablet (25 mcg total) by mouth daily before breakfast. Noreene Bearded, PA Taking  Active Self, Pharmacy Records, Multiple Informants  methylPREDNISolone (MEDROL DOSEPAK) 4 MG TBPK tablet [id number] No Medrol Dosepak take as instructed  Patient not taking: Reported on 11/16/2023   Bobbetta Burnet, MD Not Taking Active   nicotine polacrilex (NICORETTE) 2 MG gum [id number] No Take 1 each (2 mg total) by mouth as needed for smoking cessation.  Patient not taking: Reported on 11/16/2023   Laneta Pintos, MD Not Taking Active   pantoprazole (PROTONIX) 40 MG tablet [id number] Yes Take 1 tablet (40 mg total) by mouth daily. Bobbetta Burnet, MD Taking Active   potassium chloride SA (KLOR-CON M) 20 MEQ tablet [id number] Yes TAKE 1 TABLET DAILY (KEEP UPCOMING APPOINTMENT FOR FUTURE REFILLS)  Patient taking differently: Take 20 mEq by mouth daily.   Goodrich, Callie E, PA-C Taking Active Self, Pharmacy Records, Multiple Informants  progesterone (PROMETRIUM) 200 MG capsule [id number] Yes Take 200 mg by mouth at bedtime. [provider] Taking Active Self, Pharmacy Records, Multiple Informants            Recommendation:   PCP Follow-up  Follow Up Plan:   Telephone follow-up in 1  month  Augustin Leber RN, BSN, [facility] [place]  [facility], [facility]  Care Coordinator Phone: [phone]

## 2023-11-16 NOTE — Patient Instructions (Signed)
 Visit Information  Thank you for taking time to visit with me today. Please don't hesitate to contact me if I can be of assistance to you before our next scheduled appointment.  Your next care management appointment is by telephone on 12/15/23 at 115 pm  Telephone follow-up in 1 month  Please call the care guide team at [phone] if you need to cancel, schedule, or reschedule an appointment.   Please call 1-800-273-TALK (toll free, 24 hour hotline) if you are experiencing a Mental Health or Behavioral Health Crisis or need someone to talk to.  Augustin Leber RN, BSN, [facility] [place]  [facility], [facility]  Care Coordinator Phone: [phone]

## 2023-11-23 ENCOUNTER — Encounter: Admitting: Internal Medicine

## 2023-11-24 ENCOUNTER — Other Ambulatory Visit: Payer: Self-pay | Admitting: *Deleted

## 2023-11-25 NOTE — Patient Outreach (Addendum)
 Complex Care Management   Visit Note  11/25/2023-late entry  Name:  Christie Moreno MRN: [id number] DOB: 28-Aug-1955  Situation: Referral received for Complex Care Management related to [facility]/Behavioral Health diagnosis depression sadness. Referral received as a result of a EMMI call.  I obtained verbal consent from Patient.  Visit completed with patient on 11/24/23  on the phone  Background:   Past Medical History:  Diagnosis Date   Complex partial seizure (HCC) 04/22/2015   Convulsions (HCC) 04/19/2013   Heart failure (HCC)    Seizures (HCC)    none in years, recently had medication change    Assessment: Patient Reported Symptoms:  Cognitive        Neurological Neurological Review of Symptoms: No symptoms reported    HEENT HEENT Symptoms Reported: No symptoms reported (needs glasses)      Cardiovascular   Cardiovascular Conditions: Heart failure Cardiovascular Management Strategies: Medication therapy, Routine screening, Weight management Do You Have a Working Readable Scale?: Yes Weight: 75 lb (34 kg) Cardiovascular Self-Management Outcome: 4 (good)  Respiratory Respiratory Symptoms Reported: Shortness of breath, Productive cough Additional Respiratory Details: occassionally smoke-"my provider prescribed me the gum" Respiratory Conditions: COPD, Shortness of breath Respiratory Self-Management Outcome: 4 (good) Respiratory Comment: manages with medications  Endocrine Patient reports the following symptoms related to hypoglycemia or hyperglycemia : No symptoms reported    Gastrointestinal Gastrointestinal Symptoms Reported: No symptoms reported      Genitourinary Genitourinary Symptoms Reported: No symptoms reported    Integumentary Integumentary Symptoms Reported: No symptoms reported    Musculoskeletal Musculoskelatal Symptoms Reviewed: No symptoms reported        Psychosocial Psychosocial Symptoms Reported: Depression - if selected complete PHQ  2-9 Additional Psychological Details: "part of my depression is too many people at once" -family challenges Behavioral Health Conditions: Depression Behavioral Management Strategies: Coping strategies Major Change/Loss/Stressor/Fears (CP): Relationship concerns, Medical condition, family Behaviors When Feeling Stressed/Fearful: "understanding that things are out of my control" Techniques to Cope with Loss/Stress/Change: Diversional activities Quality of Family Relationships: supportive Do you feel physically threatened by others?: No      11/24/2023    3:26 PM  Depression screen PHQ 2/9  Decreased Interest 1  Down, Depressed, Hopeless 1  PHQ - 2 Score 2  Altered sleeping 0  Tired, decreased energy 1  Change in appetite 2  Feeling bad or failure about yourself  1  Trouble concentrating 0  Moving slowly or fidgety/restless 0  Suicidal thoughts 0  PHQ-9 Score 6    There were no vitals filed for this visit.  Medications Reviewed Today     Reviewed by Ave Leisure, LCSW (Social Worker) on 11/24/23 at 1535  Med List Status: <None>   Medication Order Taking? Sig Documenting Provider Last Dose Status Informant  albuterol  (PROVENTIL ) (2.5 MG/3ML) 0.083% nebulizer solution [id number]  Take 3 mLs (2.5 mg total) by nebulization every 6 (six) hours as needed for wheezing or shortness of breath. Laneta Pintos, MD  Active   albuterol  (VENTOLIN  HFA) 108 [phone] Base) MCG/ACT inhaler [id number] Yes Inhale 2 puffs into the lungs every 4 (four) hours as needed for wheezing or shortness of breath. Horton, Vonzella Guernsey, MD Taking Active Self, Pharmacy Records, Multiple Informants  aspirin  81 MG EC tablet [id number] Yes Take 1 tablet (81 mg total) by mouth daily. Swallow whole. Arrien, Curlee Doss, MD Taking Active Self, Pharmacy Records, Multiple Informants  atorvastatin  (LIPITOR ) 80 MG tablet [id number] Yes TAKE 1 TABLET DAILY AT 6 P.M. (KEEP UPCOMING  APPOINTMENT FOR FUTURE REFILLS  Patient taking  differently: Take 80 mg by mouth daily.   Avanell Leigh, MD Taking Active Self, Pharmacy Records, Multiple Informants  budeson-glycopyrrolate-formoterol (BREZTRI  AEROSPHERE) 160-9-4.8 MCG/ACT AERO inhaler [id number] Yes Inhale 2 puffs into the lungs 2 (two) times daily. Laneta Pintos, MD Taking Active   carvedilol  (COREG ) 3.125 MG tablet [id number] Yes TAKE 1 TABLET(3.125 MG) BY MOUTH TWICE DAILY WITH A MEAL  Patient taking differently: Take 3.125 mg by mouth 2 (two) times daily with a meal.   Avanell Leigh, MD Taking Active Self, Pharmacy Records, Multiple Informants  clopidogrel  (PLAVIX ) 75 MG tablet [id number] Yes Take 1 tablet (75 mg total) by mouth daily. Vann, Jessica U, DO Taking Active Self, Pharmacy Records, Multiple Informants  estrogens, conjugated, (PREMARIN) 1.25 MG tablet [id number] Yes Take 1.25 mg by mouth daily. [provider] Taking Active Self, Pharmacy Records, Multiple Informants           Med Note Del Favia, [name]   Mon Nov 02, 2021  5:03 AM)    feeding supplement (ENSURE ENLIVE / ENSURE PLUS) LIQD [id number] Yes Take 237 mLs by mouth 2 (two) times daily between meals. Arrien, Curlee Doss, MD Taking Active Self, Pharmacy Records, Multiple Informants           Med Note Baltazar Leventhal, Alethia Huxley   Fri Oct 28, 2023  9:19 AM) Pt admits she is not drinking it as much as she should   furosemide  (LASIX ) 40 MG tablet [id number] Yes Take 0.5 tablets (20 mg total) by mouth daily. TAKE ONE-HALF (1/2) TABLET (20 MG TOTAL) DAILY  Patient taking differently: Take 20-40 mg by mouth See admin instructions. Take an additional half tablet 20 mg if needed for swelling   Avanell Leigh, MD Taking Active Self, Pharmacy Records, Multiple Informants           Med Note Alline Ivans   Fri Oct 28, 2023  9:20 AM)    JARDIANCE  10 MG TABS tablet [id number] Yes TAKE 1 TABLET DAILY Avanell Leigh, MD Taking Active Self, Pharmacy Records, Multiple Informants  lamoTRIgine  (LAMICTAL ) 100  MG tablet [id number] Yes Take 1 tablet (100 mg total) by mouth 2 (two) times daily. Wess Hammed, NP Taking Active Self, Pharmacy Records, Multiple Informants  levothyroxine  (SYNTHROID ) 25 MCG tablet [id number] Yes Take 1 tablet (25 mcg total) by mouth daily before breakfast. Noreene Bearded, PA Taking Active Self, Pharmacy Records, Multiple Informants  methylPREDNISolone  (MEDROL  DOSEPAK) 4 MG TBPK tablet [id number] No Medrol  Dosepak take as instructed  Patient not taking: Reported on 11/24/2023   Bobbetta Burnet, MD Not Taking Active   nicotine  polacrilex (NICORETTE ) 2 MG gum [id number] No Take 1 each (2 mg total) by mouth as needed for smoking cessation.  Patient not taking: Reported on 11/24/2023   Laneta Pintos, MD Not Taking Active   pantoprazole  (PROTONIX ) 40 MG tablet [id number] No Take 1 tablet (40 mg total) by mouth daily.  Patient not taking: Reported on 11/24/2023   Bobbetta Burnet, MD Not Taking Active   potassium chloride  SA (KLOR-CON  M) 20 MEQ tablet [id number] Yes TAKE 1 TABLET DAILY (KEEP UPCOMING APPOINTMENT FOR FUTURE REFILLS)  Patient taking differently: Take 20 mEq by mouth daily.   Goodrich, Callie E, PA-C Taking Active Self, Pharmacy Records, Multiple Informants  progesterone (PROMETRIUM) 200 MG capsule [id number] Yes Take 200 mg by mouth at bedtime. [provider] Taking Active Self, Pharmacy Records, Multiple Informants  Recommendation:   No recommendations at this time  Follow Up Plan:   No further follow up required from clinical social work.  Patient declines further follow up at this time.    Khloee Garza, LCSW [place]  [facility], [facility] Licensed Clinical Social Worker Care Coordinator  Direct Dial: [phone]

## 2023-11-25 NOTE — Patient Instructions (Signed)
 Visit Information  Thank you for taking time to visit with me today. Please don't hesitate to contact me if I can be of assistance to you before our next scheduled appointment.   Following is a copy of your care plan:   Goals Addressed             This Visit's Progress    VBCI Social Work Care Plan       Problems:   Depression    CSW Clinical Goal(s):   Patient will work with Child psychotherapist to address concerns related to symptoms of depression if needed in the future-patient currently declines ongoing mental health follow up .  Interventions:  Social Determinants of Health in Patient with Depression: SDOH assessments completed: Depression  , Food Insecurity , Housing , Intimate Partner Violence, and Transportation Evaluation of current treatment plan related to unmet needs Depression screening reviewed Active listening/reflection utilized Emotional support provided Ongoing mental health follow up encouraged    Patient Goals/Self-Care Activities:  Continue taking your medication as prescribed.   Continue to consider ongoing counseling if needed in the future.  Plan:   No further follow up required: patient declines need for ongoing case management/mental health follow up         Please call 911 if you are experiencing a Mental Health or Behavioral Health Crisis or need someone to talk to.  Patient verbalizes understanding of instructions and care plan provided today and agrees to view in MyChart. Active MyChart status and patient understanding of how to access instructions and care plan via MyChart confirmed with patient.     Arpita Fentress, LCSW [place]  [facility], [facility] Licensed Clinical Social Worker Care Coordinator  Direct Dial: [phone]

## 2023-12-01 ENCOUNTER — Other Ambulatory Visit: Payer: Self-pay

## 2023-12-01 MED ORDER — CARVEDILOL 3.125 MG PO TABS: ORAL_TABLET | ORAL | 2 refills | Status: DC

## 2023-12-15 ENCOUNTER — Other Ambulatory Visit: Payer: Self-pay

## 2023-12-15 NOTE — Patient Instructions (Signed)
 Visit Information  Thank you for taking time to visit with me today. Please don't hesitate to contact me if I can be of assistance to you before our next scheduled appointment.  Your next care management appointment is  scheduled for:  01/18/24  3 pm  Please call the care guide team at [phone] if you need to cancel, schedule, or reschedule an appointment.   Please call [phone]-TALK (toll free, 24 hour hotline) if you are experiencing a Mental Health or Behavioral Health Crisis or need someone to talk to.  Augustin Leber RN, BSN, [facility]  [facility], [facility]  Care Coordinator Phone: [phone]

## 2023-12-15 NOTE — Patient Outreach (Signed)
 Complex Care Management   Visit Note  12/15/2023  Name:  Christie Moreno MRN: [id number] DOB: 08-27-1955  Situation: Referral received for Complex Care Management related to Heart Failure and COPD I obtained verbal consent from Patient.  Visit completed with Patient  on the phone  Background:   Past Medical History:  Diagnosis Date   Complex partial seizure (HCC) 04/22/2015   Convulsions (HCC) 04/19/2013   Heart failure (HCC)    Seizures (HCC)    none in years, recently had medication change    Assessment:  Christie Moreno reports that she is managing reasonably well. However, she is experiencing swelling in her left foot and elevates it whenever feasible. She indicated that she consumes Ensure, but not as frequently as recommended due to its expense; therefore, I will arrange for coupons to be mailed to her. Additionally, she mentioned concerns regarding her breathing, stating that at times, the airflow does not seem correct. I advised her to wear a mask when outdoors to mitigate exposure to pollen and continue her medication regime.  Patient Reported Symptoms:  Cognitive Cognitive Status: Able to follow simple commands, Alert and oriented to person, place, and time, Insightful and able to interpret abstract concepts, Normal speech and language skills      Neurological Neurological Review of Symptoms: Not assessed    HEENT HEENT Symptoms Reported: Not assessed      Cardiovascular Cardiovascular Symptoms Reported: Swelling in legs or feet Cardiovascular Conditions: Heart failure Cardiovascular Management Strategies: Medication therapy, Routine screening, Weight management Do You Have a Working Readable Scale?: Yes Weight: 75 lb 12.8 oz (34.4 kg) Cardiovascular Self-Management Outcome: 4 (good)  Respiratory Respiratory Symptoms Reported: Productive cough Respiratory Conditions: COPD, Shortness of breath Respiratory Comment: She uses heer nebulizer and inhaler  along with hot tea  and water to loosen the mucus  Endocrine Patient reports the following symptoms related to hypoglycemia or hyperglycemia : Not assessed    Gastrointestinal Gastrointestinal Symptoms Reported: Not assessed      Genitourinary Genitourinary Symptoms Reported: Not assessed    Integumentary Integumentary Symptoms Reported: Not assessed    Musculoskeletal Musculoskelatal Symptoms Reviewed: Not assessed        Psychosocial Psychosocial Symptoms Reported: Not assessed     Quality of Family Relationships: supportive Do you feel physically threatened by others?: No      11/24/2023    3:26 PM  Depression screen PHQ 2/9  Decreased Interest 1  Down, Depressed, Hopeless 1  PHQ - 2 Score 2  Altered sleeping 0  Tired, decreased energy 1  Change in appetite 2  Feeling bad or failure about yourself  1  Trouble concentrating 0  Moving slowly or fidgety/restless 0  Suicidal thoughts 0  PHQ-9 Score 6    There were no vitals filed for this visit.  Medications Reviewed Today     Reviewed by Augustin Leber, RN (Registered Nurse) on 12/15/23 at 1335  Med List Status: <None>   Medication Order Taking? Sig Documenting Provider Last Dose Status Informant  albuterol  (PROVENTIL ) (2.5 MG/3ML) 0.083% nebulizer solution [id number] Yes Take 3 mLs (2.5 mg total) by nebulization every 6 (six) hours as needed for wheezing or shortness of breath. Laneta Pintos, MD Taking Active   albuterol  (VENTOLIN  HFA) 108 [phone] Base) MCG/ACT inhaler [id number] Yes Inhale 2 puffs into the lungs every 4 (four) hours as needed for wheezing or shortness of breath. Horton, Vonzella Guernsey, MD Taking Active Self, Pharmacy Records, Multiple Informants  aspirin  81 MG EC tablet  [id number] Yes Take 1 tablet (81 mg total) by mouth daily. Swallow whole. Arrien, Curlee Doss, MD Taking Active Self, Pharmacy Records, Multiple Informants  atorvastatin  (LIPITOR ) 80 MG tablet [id number] Yes TAKE 1 TABLET DAILY AT 6 P.M. (KEEP UPCOMING APPOINTMENT  FOR FUTURE REFILLS  Patient taking differently: Take 80 mg by mouth daily.   Avanell Leigh, MD Taking Active Self, Pharmacy Records, Multiple Informants  budeson-glycopyrrolate-formoterol (BREZTRI  AEROSPHERE) 160-9-4.8 MCG/ACT AERO inhaler [id number] Yes Inhale 2 puffs into the lungs 2 (two) times daily. Laneta Pintos, MD Taking Active   carvedilol  (COREG ) 3.125 MG tablet [id number] Yes TAKE 1 TABLET(3.125 MG) BY MOUTH TWICE DAILY WITH A MEAL Avanell Leigh, MD Taking Active   clopidogrel  (PLAVIX ) 75 MG tablet [id number] Yes Take 1 tablet (75 mg total) by mouth daily. Vann, Jessica U, DO Taking Active Self, Pharmacy Records, Multiple Informants  estrogens, conjugated, (PREMARIN) 1.25 MG tablet [id number] Yes Take 1.25 mg by mouth daily. [provider] Taking Active Self, Pharmacy Records, Multiple Informants           Med Note Del Favia, [name]   Mon Nov 02, 2021  5:03 AM)    feeding supplement (ENSURE ENLIVE / ENSURE PLUS) LIQD [id number] Yes Take 237 mLs by mouth 2 (two) times daily between meals. Arrien, Curlee Doss, MD Taking Active Self, Pharmacy Records, Multiple Informants           Med Note Baltazar Leventhal, Alethia Huxley   Fri Oct 28, 2023  9:19 AM) Pt admits she is not drinking it as much as she should   furosemide  (LASIX ) 40 MG tablet [id number] Yes Take 0.5 tablets (20 mg total) by mouth daily. TAKE ONE-HALF (1/2) TABLET (20 MG TOTAL) DAILY  Patient taking differently: Take 20-40 mg by mouth See admin instructions. Take an additional half tablet 20 mg if needed for swelling   Avanell Leigh, MD Taking Active Self, Pharmacy Records, Multiple Informants           Med Note Alline Ivans   Fri Oct 28, 2023  9:20 AM)    JARDIANCE  10 MG TABS tablet [id number] Yes TAKE 1 TABLET DAILY Avanell Leigh, MD Taking Active Self, Pharmacy Records, Multiple Informants  lamoTRIgine  (LAMICTAL ) 100 MG tablet [id number] Yes Take 1 tablet (100 mg total) by mouth 2 (two) times daily. Wess Hammed, NP Taking Active Self, Pharmacy Records, Multiple Informants  levothyroxine  (SYNTHROID ) 25 MCG tablet [id number] Yes Take 1 tablet (25 mcg total) by mouth daily before breakfast. Noreene Bearded, PA Taking Active Self, Pharmacy Records, Multiple Informants  methylPREDNISolone  (MEDROL  DOSEPAK) 4 MG TBPK tablet [id number] No Medrol  Dosepak take as instructed  Patient not taking: Reported on 11/16/2023   Bobbetta Burnet, MD Not Taking Active   nicotine  polacrilex (NICORETTE ) 2 MG gum [id number] No Take 1 each (2 mg total) by mouth as needed for smoking cessation.  Patient not taking: Reported on 11/16/2023   Laneta Pintos, MD Not Taking Active   pantoprazole  (PROTONIX ) 40 MG tablet [id number] No Take 1 tablet (40 mg total) by mouth daily.  Patient not taking: Reported on 12/15/2023   Bobbetta Burnet, MD Not Taking Active   potassium chloride  SA (KLOR-CON  M) 20 MEQ tablet [id number] Yes TAKE 1 TABLET DAILY (KEEP UPCOMING APPOINTMENT FOR FUTURE REFILLS)  Patient taking differently: Take 20 mEq by mouth daily.   Goodrich, Callie E, PA-C Taking Active Self, Pharmacy Records, Multiple Informants  progesterone (PROMETRIUM) 200 MG capsule [id number] Yes Take 200  mg by mouth at bedtime. [provider] Taking Active Self, Pharmacy Records, Multiple Informants            Recommendation:   PCP Follow-up Go to your Appointment for you EGD on 12/23/23  Follow Up Plan:   Telephone follow up appointment with care management team member scheduled for:  01/18/24  3 pm  Augustin Leber RN, BSN, [facility]  [facility], [facility]  Care Coordinator Phone: [phone]

## 2023-12-20 ENCOUNTER — Other Ambulatory Visit: Payer: Self-pay | Admitting: Family Medicine

## 2023-12-20 MED ORDER — PANTOPRAZOLE SODIUM 40 MG PO TBEC: 40.0000 mg | DELAYED_RELEASE_TABLET | Freq: Every day | ORAL | 3 refills | Status: DC

## 2023-12-20 MED ORDER — ALBUTEROL SULFATE HFA 108 (90 BASE) MCG/ACT IN AERS: 2.0000 | INHALATION_SPRAY | RESPIRATORY_TRACT | 3 refills | Status: AC | PRN

## 2023-12-23 ENCOUNTER — Encounter: Payer: Self-pay | Admitting: Internal Medicine

## 2023-12-23 ENCOUNTER — Ambulatory Visit (AMBULATORY_SURGERY_CENTER): Admitting: Internal Medicine

## 2023-12-23 VITALS — BP 110/53 | HR 72 | Temp 98.1°F | Resp 18 | Ht 61.0 in | Wt 77.4 lb

## 2023-12-23 DIAGNOSIS — K3189 Other diseases of stomach and duodenum: Secondary | ICD-10-CM | POA: Diagnosis not present

## 2023-12-23 DIAGNOSIS — K2980 Duodenitis without bleeding: Secondary | ICD-10-CM

## 2023-12-23 DIAGNOSIS — K449 Diaphragmatic hernia without obstruction or gangrene: Secondary | ICD-10-CM | POA: Diagnosis not present

## 2023-12-23 DIAGNOSIS — K319 Disease of stomach and duodenum, unspecified: Secondary | ICD-10-CM | POA: Diagnosis not present

## 2023-12-23 DIAGNOSIS — R1013 Epigastric pain: Secondary | ICD-10-CM

## 2023-12-23 MED ORDER — SODIUM CHLORIDE 0.9 % IV SOLN: 500.0000 mL | Freq: Once | INTRAVENOUS | Status: DC

## 2023-12-23 NOTE — Patient Instructions (Addendum)
 Awaiting pathology results.  Return to GI office for follow up appointment on 03/09/24 @ 10:50 -Call office if you need to reschedule   YOU HAD AN ENDOSCOPIC PROCEDURE TODAY AT THE [facility]:   Refer to the procedure report that was given to you for any specific questions about what was found during the examination.  If the procedure report does not answer your questions, please call your gastroenterologist to clarify.  If you requested that your care partner not be given the details of your procedure findings, then the procedure report has been included in a sealed envelope for you to review at your convenience later.  YOU SHOULD EXPECT: Some feelings of bloating in the abdomen. Passage of more gas than usual.  Walking can help get rid of the air that was put into your GI tract during the procedure and reduce the bloating. If you had a lower endoscopy (such as a colonoscopy or flexible sigmoidoscopy) you may notice spotting of blood in your stool or on the toilet paper. If you underwent a bowel prep for your procedure, you may not have a normal bowel movement for a few days.  Please Note:  You might notice some irritation and congestion in your nose or some drainage.  This is from the oxygen used during your procedure.  There is no need for concern and it should clear up in a day or so.  SYMPTOMS TO REPORT IMMEDIATELY:  Following upper endoscopy (EGD)  Vomiting of blood or coffee ground material  New chest pain or pain under the shoulder blades  Painful or persistently difficult swallowing  New shortness of breath  Fever of 100F or higher  Black, tarry-looking stools  For urgent or emergent issues, a gastroenterologist can be reached at any hour by calling [phone]. Do not use MyChart messaging for urgent concerns.    DIET:  We do recommend a small meal at first, but then you may proceed to your regular diet.  Drink plenty of fluids but you should avoid alcoholic beverages  for 24 hours.  ACTIVITY:  You should plan to take it easy for the rest of today and you should NOT DRIVE or use heavy machinery until tomorrow (because of the sedation medicines used during the test).    FOLLOW UP: Our staff will call the number listed on your records the next business day following your procedure.  We will call around 7:15- 8:00 am to check on you and address any questions or concerns that you may have regarding the information given to you following your procedure. If we do not reach you, we will leave a message.     If any biopsies were taken you will be contacted by phone or by letter within the next 1-3 weeks.  Please call us  at [phone] if you have not heard about the biopsies in 3 weeks.    SIGNATURES/CONFIDENTIALITY: You and/or your care partner have signed paperwork which will be entered into your electronic medical record.  These signatures attest to the fact that that the information above on your After Visit Summary has been reviewed and is understood.  Full responsibility of the confidentiality of this discharge information lies with you and/or your care-partner.

## 2023-12-23 NOTE — Progress Notes (Signed)
 Called to room to assist during endoscopic procedure.  Patient ID and intended procedure confirmed with present staff. Received instructions for my participation in the procedure from the performing physician.

## 2023-12-23 NOTE — Op Note (Addendum)
 [facility] Patient Name: Christie Moreno Procedure Date: 12/23/2023 10:52 AM MRN: [id number] Endoscopist: Freada Jacobs [place] , , [id number] Age: 68 Referring MD:  Date of Birth: 1955/08/21 Gender: Female Account #: [id number] Procedure:                Upper GI endoscopy Indications:              Epigastric abdominal pain, Periumbilical abdominal                            pain, Nausea Medicines:                Monitored Anesthesia Care Procedure:                Pre-Anesthesia Assessment:                           - Prior to the procedure, a History and Physical                            was performed, and patient medications and                            allergies were reviewed. The patient's tolerance of                            previous anesthesia was also reviewed. The risks                            and benefits of the procedure and the sedation                            options and risks were discussed with the patient.                            All questions were answered, and informed consent                            was obtained. Prior Anticoagulants: The patient has                            taken Plavix  (clopidogrel ), last dose was 7 days                            prior to procedure. ASA Grade Assessment: III - A                            patient with severe systemic disease. After                            reviewing the risks and benefits, the patient was                            deemed in satisfactory condition to undergo the  procedure.                           After obtaining informed consent, the endoscope was                            passed under direct vision. Throughout the                            procedure, the patient's blood pressure, pulse, and                            oxygen saturations were monitored continuously. The                            GIF [id number] #[id number] was introduced through the                             mouth, and advanced to the second part of duodenum.                            The upper GI endoscopy was accomplished without                            difficulty. The patient tolerated the procedure                            well. Scope In: Scope Out: Findings:                 The examined esophagus was normal.                           A small hiatal hernia was present.                           Localized mildly erythematous mucosa without                            bleeding was found in the gastric antrum. Biopsies                            were taken with a cold forceps for histology.                           Localized nodular mucosa was found in the duodenal                            bulb. Biopsies were taken with a cold forceps for                            histology. Complications:            No immediate complications. Estimated Blood Loss:     Estimated blood loss was minimal. Impression:               -  Normal esophagus.                           - Small hiatal hernia.                           - Erythematous mucosa in the antrum. Biopsied.                           - Nodular mucosa in the duodenal bulb. Biopsied. Recommendation:           - Discharge patient to home (with escort).                           - Await pathology results.                           - Okay to restart Plavix  tomorrow.                           - Return to GI clinic in 3-4 months.                           - The findings and recommendations were discussed                            with the patient. Dr Pedro Bourgeois "Bret Harte" [place],  12/23/2023 11:32:38 AM

## 2023-12-23 NOTE — Progress Notes (Signed)
 GASTROENTEROLOGY PROCEDURE H&P NOTE   Primary Care Physician: Noreene Bearded, PA    Reason for Procedure:   Epigastric/periumbilical ab pain, nausea  Plan:    EGD  Patient is appropriate for endoscopic procedure(s) in the ambulatory ([facility]) setting.  The nature of the procedure, as well as the risks, benefits, and alternatives were carefully and thoroughly reviewed with the patient. Ample time for discussion and questions allowed. The patient understood, was satisfied, and agreed to proceed.     HPI: Christie Moreno is a 68 y.o. female who presents for EGD for evaluation of epigastric/periumbilical ab pain, nausea .  Patient was most recently seen in the Gastroenterology Clinic on 07/19/23.  Her ab pain and nausea have actually improved after she had had revascularization procedure with SMA lithotripsy and stenting on 07/25/23. Please refer to that note for full details regarding GI history and clinical presentation.   Past Medical History:  Diagnosis Date   Complex partial seizure (HCC) 04/22/2015   Convulsions (HCC) 04/19/2013   Heart failure (HCC)    Seizures (HCC)    none in years, recently had medication change   Thyroid disease     Past Surgical History:  Procedure Laterality Date   COLONOSCOPY     PERIPHERAL INTRAVASCULAR LITHOTRIPSY  07/25/2023   Procedure: PERIPHERAL INTRAVASCULAR LITHOTRIPSY;  Surgeon: Philipp Brawn, MD;  Location: [facility] INVASIVE CV LAB;  Service: Cardiovascular;;   PERIPHERAL VASCULAR INTERVENTION  07/25/2023   Procedure: PERIPHERAL VASCULAR INTERVENTION;  Surgeon: Philipp Brawn, MD;  Location: [facility] INVASIVE CV LAB;  Service: Cardiovascular;;   RIGHT/LEFT HEART CATH AND CORONARY ANGIOGRAPHY N/A 11/05/2021   Procedure: RIGHT/LEFT HEART CATH AND CORONARY ANGIOGRAPHY;  Surgeon: Arty Binning, MD;  Location: [facility] INVASIVE CV LAB;  Service: Cardiovascular;  Laterality: N/A;   VISCERAL ANGIOGRAPHY N/A 07/25/2023   Procedure: VISCERAL ANGIOGRAPHY;   Surgeon: Philipp Brawn, MD;  Location: [facility] INVASIVE CV LAB;  Service: Cardiovascular;  Laterality: N/A;    Prior to Admission medications   Medication Sig Start Date End Date Taking? Authorizing Provider  albuterol  (VENTOLIN  HFA) 108 (90 Base) MCG/ACT inhaler Inhale 2 puffs into the lungs every 4 (four) hours as needed for wheezing or shortness of breath. 12/20/23  Yes Laneta Pintos, MD  aspirin  81 MG EC tablet Take 1 tablet (81 mg total) by mouth daily. Swallow whole. 11/06/21  Yes Arrien, Mauricio Daniel, MD  atorvastatin  (LIPITOR ) 80 MG tablet TAKE 1 TABLET DAILY AT 6 P.M. (KEEP UPCOMING APPOINTMENT FOR FUTURE REFILLS Patient taking differently: Take 80 mg by mouth daily. 05/11/23  Yes Avanell Leigh, MD  carvedilol  (COREG ) 3.125 MG tablet TAKE 1 TABLET(3.125 MG) BY MOUTH TWICE DAILY WITH A MEAL 12/01/23  Yes Avanell Leigh, MD  estrogens, conjugated, (PREMARIN) 1.25 MG tablet Take 1.25 mg by mouth daily.   Yes [provider]  furosemide  (LASIX ) 40 MG tablet Take 0.5 tablets (20 mg total) by mouth daily. TAKE ONE-HALF (1/2) TABLET (20 MG TOTAL) DAILY Patient taking differently: Take 20-40 mg by mouth See admin instructions. Take an additional half tablet 20 mg if needed for swelling 09/06/23  Yes Avanell Leigh, MD  lamoTRIgine  (LAMICTAL ) 100 MG tablet Take 1 tablet (100 mg total) by mouth 2 (two) times daily. 08/22/23  Yes Wess Hammed, NP  levothyroxine  (SYNTHROID ) 25 MCG tablet Take 1 tablet (25 mcg total) by mouth daily before breakfast. 06/24/23  Yes Maryclare Smoke A, PA  pantoprazole  (PROTONIX ) 40 MG tablet Take  1 tablet (40 mg total) by mouth daily. 12/20/23  Yes Laneta Pintos, MD  potassium chloride  SA (KLOR-CON  M) 20 MEQ tablet TAKE 1 TABLET DAILY (KEEP UPCOMING APPOINTMENT FOR FUTURE REFILLS) Patient taking differently: Take 20 mEq by mouth daily. 02/28/23  Yes Goodrich, Callie E, PA-C  progesterone (PROMETRIUM) 200 MG capsule Take 200 mg by mouth at bedtime. 04/20/22   Yes [provider]  albuterol  (PROVENTIL ) (2.5 MG/3ML) 0.083% nebulizer solution Take 3 mLs (2.5 mg total) by nebulization every 6 (six) hours as needed for wheezing or shortness of breath. 11/09/23   Laneta Pintos, MD  budeson-glycopyrrolate-formoterol (BREZTRI  AEROSPHERE) 160-9-4.8 MCG/ACT AERO inhaler Inhale 2 puffs into the lungs 2 (two) times daily. 11/09/23   Laneta Pintos, MD  clopidogrel  (PLAVIX ) 75 MG tablet Take 1 tablet (75 mg total) by mouth daily. 07/27/23   Vann, Jessica U, DO  feeding supplement (ENSURE ENLIVE / ENSURE PLUS) LIQD Take 237 mLs by mouth 2 (two) times daily between meals. 11/06/21   Arrien, Curlee Doss, MD  JARDIANCE  10 MG TABS tablet TAKE 1 TABLET DAILY 01/07/23   Avanell Leigh, MD  methylPREDNISolone  (MEDROL  DOSEPAK) 4 MG TBPK tablet Medrol  Dosepak take as instructed Patient not taking: Reported on 12/23/2023 11/01/23   Shahmehdi, Seyed A, MD  nicotine  polacrilex (NICORETTE ) 2 MG gum Take 1 each (2 mg total) by mouth as needed for smoking cessation. Patient not taking: Reported on 12/23/2023 11/09/23   Laneta Pintos, MD    Current Outpatient Medications  Medication Sig Dispense Refill   albuterol  (VENTOLIN  HFA) 108 (90 Base) MCG/ACT inhaler Inhale 2 puffs into the lungs every 4 (four) hours as needed for wheezing or shortness of breath. 3 each 3   aspirin  81 MG EC tablet Take 1 tablet (81 mg total) by mouth daily. Swallow whole. 30 tablet 0   atorvastatin  (LIPITOR ) 80 MG tablet TAKE 1 TABLET DAILY AT 6 P.M. (KEEP UPCOMING APPOINTMENT FOR FUTURE REFILLS (Patient taking differently: Take 80 mg by mouth daily.) 30 tablet 11   carvedilol  (COREG ) 3.125 MG tablet TAKE 1 TABLET(3.125 MG) BY MOUTH TWICE DAILY WITH A MEAL 180 tablet 2   estrogens, conjugated, (PREMARIN) 1.25 MG tablet Take 1.25 mg by mouth daily.     furosemide  (LASIX ) 40 MG tablet Take 0.5 tablets (20 mg total) by mouth daily. TAKE ONE-HALF (1/2) TABLET (20 MG TOTAL) DAILY (Patient taking  differently: Take 20-40 mg by mouth See admin instructions. Take an additional half tablet 20 mg if needed for swelling) 45 tablet 3   lamoTRIgine  (LAMICTAL ) 100 MG tablet Take 1 tablet (100 mg total) by mouth 2 (two) times daily. 60 tablet 0   levothyroxine  (SYNTHROID ) 25 MCG tablet Take 1 tablet (25 mcg total) by mouth daily before breakfast. 90 tablet 3   pantoprazole  (PROTONIX ) 40 MG tablet Take 1 tablet (40 mg total) by mouth daily. 90 tablet 3   potassium chloride  SA (KLOR-CON  M) 20 MEQ tablet TAKE 1 TABLET DAILY (KEEP UPCOMING APPOINTMENT FOR FUTURE REFILLS) (Patient taking differently: Take 20 mEq by mouth daily.) 90 tablet 3   progesterone (PROMETRIUM) 200 MG capsule Take 200 mg by mouth at bedtime.     albuterol  (PROVENTIL ) (2.5 MG/3ML) 0.083% nebulizer solution Take 3 mLs (2.5 mg total) by nebulization every 6 (six) hours as needed for wheezing or shortness of breath. 90 mL 12   budeson-glycopyrrolate-formoterol (BREZTRI  AEROSPHERE) 160-9-4.8 MCG/ACT AERO inhaler Inhale 2 puffs into the lungs 2 (two) times daily. 10.7 g  11   clopidogrel  (PLAVIX ) 75 MG tablet Take 1 tablet (75 mg total) by mouth daily. 90 tablet 0   feeding supplement (ENSURE ENLIVE / ENSURE PLUS) LIQD Take 237 mLs by mouth 2 (two) times daily between meals. 237 mL 0   JARDIANCE  10 MG TABS tablet TAKE 1 TABLET DAILY 90 tablet 3   methylPREDNISolone  (MEDROL  DOSEPAK) 4 MG TBPK tablet Medrol  Dosepak take as instructed (Patient not taking: Reported on 12/23/2023) 21 tablet 0   nicotine  polacrilex (NICORETTE ) 2 MG gum Take 1 each (2 mg total) by mouth as needed for smoking cessation. (Patient not taking: Reported on 12/23/2023) 100 tablet 0   Current Facility-Administered Medications  Medication Dose Route Frequency Provider Last Rate Last Admin   0.9 %  sodium chloride  infusion  500 mL Intravenous Once Daina Drum, MD        Allergies as of 12/23/2023 - Review Complete 12/23/2023  Allergen Reaction Noted   Azithromycin  Nausea And Vomiting 04/19/2014   Penicillins Itching and Rash 06/14/2012   Percocet [oxycodone-acetaminophen ] Rash 06/14/2012    Family History  Adopted: Yes  Problem Relation Age of Onset   Breast cancer Mother    Diabetes Mother    Cancer Mother        breast   Stroke Mother    Heart Problems Father    Diabetes Sister    Kidney failure Sister    Breast cancer Maternal Aunt    Lung cancer Half-Sister    Colon cancer Neg Hx    Esophageal cancer Neg Hx    Epilepsy Neg Hx    Stomach cancer Neg Hx    Rectal cancer Neg Hx     Social History   Socioeconomic History   Marital status: Significant Other    Spouse name: Sherwood Donath   Number of children: 1   Years of education: Not on file   Highest education level: Bachelor's degree (e.g., BA, AB, BS)  Occupational History   Occupation: retired  Tobacco Use   Smoking status: Some Days    Current packs/day: 0.00    Average packs/day: 1 pack/day for 52.0 years (52.0 ttl pk-yrs)    Types: Cigarettes    Start date: 11/09/1969    Last attempt to quit: 11/09/2021    Years since quitting: 2.1    Passive exposure: Current   Smokeless tobacco: Never   Tobacco comments:    08/31/2023 Patient smoke about 3 cigarettes daily    Currently 1 pack every 2-3 days    04/06/2022 patient states when she smokes its 1 cigarette    04/05/2023 Patient smokes 2 cigarettes some days   Vaping Use   Vaping status: Never Used  Substance and Sexual Activity   Alcohol use: Not Currently    Comment: socially   Drug use: No   Sexual activity: Not on file  Other Topics Concern   Not on file  Social History Narrative   Not on file   Social Drivers of Health   Financial Resource Strain: Medium Risk (05/04/2023)   Overall Financial Resource Strain (CARDIA)    Difficulty of Paying Living Expenses: Somewhat hard  Food Insecurity: No Food Insecurity (11/24/2023)   Hunger Vital Sign    Worried About Running Out of Food in the Last Year: Never true     Ran Out of Food in the Last Year: Never true  Transportation Needs: No Transportation Needs (11/24/2023)   PRAPARE - Administrator, Civil Service (Medical):  No    Lack of Transportation (Non-Medical): No  Physical Activity: Inactive (03/17/2023)   Exercise Vital Sign    Days of Exercise per Week: 0 days    Minutes of Exercise per Session: 0 min  Stress: Stress Concern Present (05/04/2023)   [name] - Occupational Stress Questionnaire    Feeling of Stress : To some extent  Social Connections: Socially Isolated (10/28/2023)   Social Connection and Isolation Panel [NHANES]    Frequency of Communication with Friends and Family: More than three times a week    Frequency of Social Gatherings with Friends and Family: More than three times a week    Attends Religious Services: Never    Database administrator or Organizations: No    Attends Banker Meetings: Never    Marital Status: Widowed  Intimate Partner Violence: Not At Risk (11/24/2023)   Humiliation, Afraid, Rape, and Kick questionnaire    Fear of Current or Ex-Partner: No    Emotionally Abused: No    Physically Abused: No    Sexually Abused: No    Physical Exam: Vital signs in last 24 hours: BP (!) 144/86   Pulse 76   Temp 98.1 F (36.7 C) (Temporal)   Ht 5\' 1"  (1.549 m)   Wt 77 lb 6.4 oz (35.1 kg)   SpO2 98%   BMI 14.62 kg/m  GEN: NAD EYE: Sclerae anicteric ENT: MMM CV: Non-tachycardic Pulm: No increased WOB GI: Soft NEURO:  Alert & Oriented   Regino Caprio, MD [facility]   12/23/2023 10:56 AM

## 2023-12-23 NOTE — Progress Notes (Signed)
 Pt sedate, gd SR's, VSS, report to RN

## 2023-12-27 ENCOUNTER — Telehealth: Payer: Self-pay

## 2023-12-27 NOTE — Telephone Encounter (Signed)
  Follow up Call-     12/23/2023    9:56 AM 07/19/2022    1:45 PM  Call back number  Post procedure Call Back phone  # [phone]  Permission to leave phone message Yes Yes     Patient questions:  Do you have a fever, pain , or abdominal swelling? No. Pain Score  0 *  Have you tolerated food without any problems? Yes.    Have you been able to return to your normal activities? Yes.    Do you have any questions about your discharge instructions: Diet   No. Medications  No. Follow up visit  No.  Do you have questions or concerns about your Care? No.  Actions: * If pain score is 4 or above: No action needed, pain <4.

## 2023-12-28 ENCOUNTER — Ambulatory Visit: Payer: Self-pay | Admitting: Internal Medicine

## 2023-12-28 LAB — SURGICAL PATHOLOGY

## 2023-12-29 NOTE — Progress Notes (Unsigned)
 Patient ID: Christie Moreno, female   DOB: January 16, 1956, 68 y.o.   MRN: [id number]  Reason for Consult: No chief complaint on file.   Referred by Noreene Bearded, PA  Subjective:     HPI Christie Moreno is a 68 y.o. female ***  Past Medical History:  Diagnosis Date   Complex partial seizure (HCC) 04/22/2015   Convulsions (HCC) 04/19/2013   Heart failure (HCC)    Seizures (HCC)    none in years, recently had medication change   Thyroid disease    Family History  Adopted: Yes  Problem Relation Age of Onset   Breast cancer Mother    Diabetes Mother    Cancer Mother        breast   Stroke Mother    Heart Problems Father    Diabetes Sister    Kidney failure Sister    Breast cancer Maternal Aunt    Lung cancer Half-Sister    Colon cancer Neg Hx    Esophageal cancer Neg Hx    Epilepsy Neg Hx    Stomach cancer Neg Hx    Rectal cancer Neg Hx    Past Surgical History:  Procedure Laterality Date   COLONOSCOPY     PERIPHERAL INTRAVASCULAR LITHOTRIPSY  07/25/2023   Procedure: PERIPHERAL INTRAVASCULAR LITHOTRIPSY;  Surgeon: Philipp Brawn, MD;  Location: [facility] INVASIVE CV LAB;  Service: Cardiovascular;;   PERIPHERAL VASCULAR INTERVENTION  07/25/2023   Procedure: PERIPHERAL VASCULAR INTERVENTION;  Surgeon: Philipp Brawn, MD;  Location: [facility] INVASIVE CV LAB;  Service: Cardiovascular;;   RIGHT/LEFT HEART CATH AND CORONARY ANGIOGRAPHY N/A 11/05/2021   Procedure: RIGHT/LEFT HEART CATH AND CORONARY ANGIOGRAPHY;  Surgeon: Arty Binning, MD;  Location: [facility] INVASIVE CV LAB;  Service: Cardiovascular;  Laterality: N/A;   VISCERAL ANGIOGRAPHY N/A 07/25/2023   Procedure: VISCERAL ANGIOGRAPHY;  Surgeon: Philipp Brawn, MD;  Location: [facility] INVASIVE CV LAB;  Service: Cardiovascular;  Laterality: N/A;    Short Social History:  Social History   Tobacco Use   Smoking status: Some Days    Current packs/day: 0.00    Average packs/day: 1 pack/day for 52.0 years (52.0 ttl pk-yrs)     Types: Cigarettes    Start date: 11/09/1969    Last attempt to quit: 11/09/2021    Years since quitting: 2.1    Passive exposure: Current   Smokeless tobacco: Never   Tobacco comments:    08/31/2023 Patient smoke about 3 cigarettes daily    Currently 1 pack every 2-3 days    04/06/2022 patient states when she smokes its 1 cigarette    04/05/2023 Patient smokes 2 cigarettes some days   Substance Use Topics   Alcohol use: Not Currently    Comment: socially    Allergies  Allergen Reactions   Azithromycin Nausea And Vomiting   Penicillins Itching and Rash   Percocet [Oxycodone-Acetaminophen ] Rash    Current Outpatient Medications  Medication Sig Dispense Refill   albuterol  (PROVENTIL ) (2.5 MG/3ML) 0.083% nebulizer solution Take 3 mLs (2.5 mg total) by nebulization every 6 (six) hours as needed for wheezing or shortness of breath. 90 mL 12   albuterol  (VENTOLIN  HFA) 108 (90 Base) MCG/ACT inhaler Inhale 2 puffs into the lungs every 4 (four) hours as needed for wheezing or shortness of breath. 3 each 3   aspirin  81 MG EC tablet Take 1 tablet (81 mg total) by mouth daily. Swallow whole. 30 tablet 0   atorvastatin  (LIPITOR ) 80 MG tablet TAKE 1  TABLET DAILY AT 6 P.M. (KEEP UPCOMING APPOINTMENT FOR FUTURE REFILLS (Patient taking differently: Take 80 mg by mouth daily.) 30 tablet 11   budeson-glycopyrrolate-formoterol (BREZTRI  AEROSPHERE) 160-9-4.8 MCG/ACT AERO inhaler Inhale 2 puffs into the lungs 2 (two) times daily. 10.7 g 11   carvedilol  (COREG ) 3.125 MG tablet TAKE 1 TABLET(3.125 MG) BY MOUTH TWICE DAILY WITH A MEAL 180 tablet 2   clopidogrel  (PLAVIX ) 75 MG tablet Take 1 tablet (75 mg total) by mouth daily. 90 tablet 0   estrogens, conjugated, (PREMARIN) 1.25 MG tablet Take 1.25 mg by mouth daily.     feeding supplement (ENSURE ENLIVE / ENSURE PLUS) LIQD Take 237 mLs by mouth 2 (two) times daily between meals. 237 mL 0   furosemide  (LASIX ) 40 MG tablet Take 0.5 tablets (20 mg total) by mouth  daily. TAKE ONE-HALF (1/2) TABLET (20 MG TOTAL) DAILY (Patient taking differently: Take 20-40 mg by mouth See admin instructions. Take an additional half tablet 20 mg if needed for swelling) 45 tablet 3   JARDIANCE  10 MG TABS tablet TAKE 1 TABLET DAILY 90 tablet 3   lamoTRIgine  (LAMICTAL ) 100 MG tablet Take 1 tablet (100 mg total) by mouth 2 (two) times daily. 60 tablet 0   levothyroxine  (SYNTHROID ) 25 MCG tablet Take 1 tablet (25 mcg total) by mouth daily before breakfast. 90 tablet 3   methylPREDNISolone  (MEDROL  DOSEPAK) 4 MG TBPK tablet Medrol  Dosepak take as instructed (Patient not taking: Reported on 12/23/2023) 21 tablet 0   nicotine  polacrilex (NICORETTE ) 2 MG gum Take 1 each (2 mg total) by mouth as needed for smoking cessation. (Patient not taking: Reported on 12/23/2023) 100 tablet 0   pantoprazole  (PROTONIX ) 40 MG tablet Take 1 tablet (40 mg total) by mouth daily. 90 tablet 3   potassium chloride  SA (KLOR-CON  M) 20 MEQ tablet TAKE 1 TABLET DAILY (KEEP UPCOMING APPOINTMENT FOR FUTURE REFILLS) (Patient taking differently: Take 20 mEq by mouth daily.) 90 tablet 3   progesterone (PROMETRIUM) 200 MG capsule Take 200 mg by mouth at bedtime.     No current facility-administered medications for this visit.    REVIEW OF SYSTEMS  All other systems were reviewed and are negative     Objective:  Objective   There were no vitals filed for this visit. There is no height or weight on file to calculate BMI.  Physical Exam General: no acute distress Cardiac: hemodynamically stable Pulm: normal work of breathing Abdomen: non-tender, no pulsatile mass*** Neuro: alert, no focal deficit Extremities: no edema, cyanosis or wounds*** Vascular:   Right: ***  Left: ***  Data: ***     Assessment/Plan:   Christie Moreno is a 68 y.o. female with ***  Recommendations to optimize cardiovascular risk: Abstinence from all tobacco products. Blood glucose control with goal A1c < 7%. Blood  pressure control with goal blood pressure < 140/90 mmHg. Lipid reduction therapy with goal LDL-C <100 mg/dL  Aspirin  81mg  PO QD.  Atorvastatin  40-80mg  PO QD (or other "high intensity" statin therapy).   Philipp Brawn MD [name]

## 2023-12-30 ENCOUNTER — Ambulatory Visit: Attending: Vascular Surgery | Admitting: Vascular Surgery

## 2023-12-30 ENCOUNTER — Encounter: Payer: Self-pay | Admitting: Vascular Surgery

## 2023-12-30 ENCOUNTER — Ambulatory Visit: Admitting: Vascular Surgery

## 2023-12-30 VITALS — BP 144/66 | HR 60 | Temp 98.2°F | Resp 16 | Ht 61.0 in | Wt 78.6 lb

## 2023-12-30 DIAGNOSIS — I739 Peripheral vascular disease, unspecified: Secondary | ICD-10-CM | POA: Diagnosis not present

## 2023-12-30 DIAGNOSIS — K551 Chronic vascular disorders of intestine: Secondary | ICD-10-CM | POA: Insufficient documentation

## 2024-01-02 ENCOUNTER — Other Ambulatory Visit: Payer: Self-pay | Admitting: Cardiovascular Disease

## 2024-01-02 ENCOUNTER — Other Ambulatory Visit: Payer: Self-pay | Admitting: *Deleted

## 2024-01-02 DIAGNOSIS — I5021 Acute systolic (congestive) heart failure: Secondary | ICD-10-CM

## 2024-01-02 DIAGNOSIS — I739 Peripheral vascular disease, unspecified: Secondary | ICD-10-CM

## 2024-01-06 ENCOUNTER — Ambulatory Visit: Payer: Medicare Other | Admitting: Vascular Surgery

## 2024-01-18 ENCOUNTER — Other Ambulatory Visit: Payer: Self-pay

## 2024-01-18 NOTE — Patient Outreach (Signed)
 Complex Care Management   Visit Note  01/18/2024  Name:  Christie Moreno MRN: [id number] DOB: [birth year]  Situation: Referral received for Complex Care Management related to Heart Failure, COPD, and Atrial Fibrillation I obtained verbal consent from Patient.  Visit completed with patient  on the phone  Background:   Past Medical History:  Diagnosis Date   Complex partial seizure (HCC) 04/22/2015   Convulsions (HCC) 04/19/2013   Heart failure (HCC)    Hyperlipidemia    Hypertension    Seizures (HCC)    none in years, recently had medication change   Thyroid disease     Assessment: Patient Reported Symptoms:  Cognitive Cognitive Status: Able to follow simple commands, Alert and oriented to person, place, and time, Normal speech and language skills      Neurological Neurological Review of Symptoms: Headaches Neurological Conditions: Headache Neurological Comment: headache for the last 2-3 day feelws like it is due to medication .  It is a dull headache  HEENT HEENT Symptoms Reported: Nasal discharge HEENT Conditions: Ear problem(s) HEENT Comment: Ear feels like they need to be popped Ear problem(s)  Cardiovascular Cardiovascular Symptoms Reported: Swelling in legs or feet (only on left side) Cardiovascular Conditions: Heart failure Cardiovascular Management Strategies: Medication therapy, Weight management, Routine screening Do You Have a Working Readable Scale?: Yes  Respiratory Respiratory Symptoms Reported: Wheezing, Productive cough Additional Respiratory Details: smokes 2 cigarettes a day Respiratory Conditions: COPD, Shortness of breath Respiratory Comment: She uses her nebulizer and inhaler aloong with hot tea and water to loosen the mucus  Endocrine Patient reports the following symptoms related to hypoglycemia or hyperglycemia : No symptoms reported Is patient diabetic?: No    Gastrointestinal Gastrointestinal Symptoms Reported: No symptoms reported       Genitourinary Genitourinary Symptoms Reported: Other, Pain with urination Other Genitourinary Symptoms: She has had some burning with urination for 2 weeks Additional Genitourinary Details: Advised the patient to get an appointment today with her gynocologist.  offered to call with her on the phone to make the appointment but she declined.    Integumentary Integumentary Symptoms Reported: No symptoms reported, Bruising Additional Integumentary Details: from blood thinners Skin Management Strategies: Medication therapy  Musculoskeletal Musculoskelatal Symptoms Reviewed: Weakness Additional Musculoskeletal Details: inactrivity due to heat and her breathing Musculoskeletal Management Strategies: Activity Falls in the past year?: No Number of falls in past year: 2 or more (banged her arrms and hit her face) Was there an injury with Fall?: Yes Fall Risk Category Calculator: 2 Patient Fall Risk Level: Moderate Fall Risk Fall risk Follow up: Falls evaluation completed, Education provided, Falls prevention discussed  Psychosocial       Quality of Family Relationships: supportive, involved Do you feel physically threatened by others?: No      01/18/2024    4:07 PM  Depression screen PHQ 2/9  Decreased Interest 1  Down, Depressed, Hopeless 1  PHQ - 2 Score 2  Altered sleeping 0  Tired, decreased energy 1  Change in appetite 0  Feeling bad or failure about yourself  1  Trouble concentrating 0  Moving slowly or fidgety/restless 0  Suicidal thoughts 0  PHQ-9 Score 4  Difficult doing work/chores Somewhat difficult    There were no vitals filed for this visit.  Medications Reviewed Today     Reviewed by Augustin Leber, RN (Registered Nurse) on 01/18/24 at 1543  Med List Status: <None>   Medication Order Taking? Sig Documenting Provider Last Dose Status Informant  albuterol  (PROVENTIL ) (  2.5 MG/3ML) 0.083% nebulizer solution [id number] Yes Take 3 mLs (2.5 mg total) by nebulization every 6  (six) hours as needed for wheezing or shortness of breath. Laneta Pintos, MD  Active   albuterol  (VENTOLIN  HFA) 108 [id number] Base) MCG/ACT inhaler [id number] Yes Inhale 2 puffs into the lungs every 4 (four) hours as needed for wheezing or shortness of breath. Laneta Pintos, MD  Active   aspirin  81 MG EC tablet [id number] Yes Take 1 tablet (81 mg total) by mouth daily. Swallow whole. Arrien, Curlee Doss, MD  Active Self, Pharmacy Records, Multiple Informants  atorvastatin  (LIPITOR ) 80 MG tablet [id number] Yes TAKE 1 TABLET DAILY AT 6 P.M. (KEEP UPCOMING APPOINTMENT FOR FUTURE REFILLS Avanell Leigh, MD  Active Self, Pharmacy Records, Multiple Informants  budeson-glycopyrrolate-formoterol (BREZTRI  AEROSPHERE) 160-9-4.8 MCG/ACT AERO inhaler [id number]  Inhale 2 puffs into the lungs 2 (two) times daily.  Patient not taking: Reported on 01/18/2024   Laneta Pintos, MD  Active   carvedilol  (COREG ) 3.125 MG tablet [id number] Yes TAKE 1 TABLET(3.125 MG) BY MOUTH TWICE DAILY WITH A MEAL Avanell Leigh, MD  Active   clopidogrel  (PLAVIX ) 75 MG tablet [id number] Yes Take 1 tablet (75 mg total) by mouth daily. Vann, Jessica U, DO  Active Self, Pharmacy Records, Multiple Informants  empagliflozin  (JARDIANCE ) 10 MG TABS tablet [id number] Yes TAKE 1 TABLET DAILY Avanell Leigh, MD  Active   estrogens, conjugated, (PREMARIN) 1.25 MG tablet [id number] Yes Take 1.25 mg by mouth daily. [provider]  Active Self, Pharmacy Records, Multiple Informants           Med Note Del Favia, [name]   Mon Nov 02, 2021  5:03 AM)    feeding supplement (ENSURE ENLIVE / ENSURE PLUS) LIQD [id number] Yes Take 237 mLs by mouth 2 (two) times daily between meals. Arrien, Curlee Doss, MD  Active Self, Pharmacy Records, Multiple Informants           Med Note Baltazar Leventhal, [name]   Fri Oct 28, 2023  9:19 AM) Pt admits she is not drinking it as much as she should   furosemide  (LASIX ) 40 MG tablet [id number] Yes Take 0.5 tablets (20  mg total) by mouth daily. TAKE ONE-HALF (1/2) TABLET (20 MG TOTAL) DAILY Avanell Leigh, MD  Active Self, Pharmacy Records, Multiple Informants           Med Note Baltazar Leventhal, [name]   Fri Oct 28, 2023  9:20 AM)    lamoTRIgine  (LAMICTAL ) 100 MG tablet [id number] Yes Take 1 tablet (100 mg total) by mouth 2 (two) times daily. Wess Hammed, NP  Active Self, Pharmacy Records, Multiple Informants  levothyroxine  (SYNTHROID ) 25 MCG tablet [id number] Yes Take 1 tablet (25 mcg total) by mouth daily before breakfast. Noreene Bearded, PA  Active Self, Pharmacy Records, Multiple Informants  methylPREDNISolone  (MEDROL  DOSEPAK) 4 MG TBPK tablet [id number]  Medrol  Dosepak take as instructed  Patient not taking: Reported on 01/18/2024   Bobbetta Burnet, MD  Active   nicotine  polacrilex (NICORETTE ) 2 MG gum [id number]  Take 1 each (2 mg total) by mouth as needed for smoking cessation.  Patient not taking: Reported on 01/18/2024   Laneta Pintos, MD  Active   pantoprazole  (PROTONIX ) 40 MG tablet [id number] Yes Take 1 tablet (40 mg total) by mouth daily. Laneta Pintos, MD  Active   potassium chloride  SA (KLOR-CON  M) 20 MEQ tablet [id number] Yes TAKE 1 TABLET DAILY (KEEP UPCOMING APPOINTMENT FOR FUTURE REFILLS)  Patient taking differently: Take 20 mEq by mouth daily.   Goodrich, Callie E, PA-C  Active Self, Pharmacy Records, Multiple Informants  progesterone (PROMETRIUM) 200 MG capsule [id number] Yes Take 200 mg by mouth at bedtime. [provider]  Active Self, Pharmacy Records, Multiple Informants            Recommendation:   Specialty provider follow-up Call gynecologist to follow up about burning with urination  Follow Up Plan:   Telephone follow up appointment date/time:  02/17/24  215  pm  Augustin Leber RN, BSN, [facility] [place]  [facility], [facility]   Care Coordinator Phone: [phone]

## 2024-01-18 NOTE — Patient Instructions (Signed)
 Visit Information  Thank you for taking time to visit with me today. Please don't hesitate to contact me if I can be of assistance to you before our next scheduled appointment.  Your next care management appointment is by telephone on 02/17/24 at 215 pm  Please call the care guide team at [phone] if you need to cancel, schedule, or reschedule an appointment.   Please call 1-800-273-TALK (toll free, 24 hour hotline) call 911 if you are experiencing a Mental Health or Behavioral Health Crisis or need someone to talk to.  Augustin Leber RN, BSN, [facility] [place]  [facility], [facility]   Care Coordinator Phone: [phone]

## 2024-01-26 ENCOUNTER — Other Ambulatory Visit (HOSPITAL_COMMUNITY): Payer: Self-pay

## 2024-02-06 ENCOUNTER — Other Ambulatory Visit (HOSPITAL_COMMUNITY): Payer: Self-pay

## 2024-02-08 ENCOUNTER — Ambulatory Visit (INDEPENDENT_AMBULATORY_CARE_PROVIDER_SITE_OTHER): Admitting: Family Medicine

## 2024-02-08 ENCOUNTER — Encounter: Payer: Self-pay | Admitting: Family Medicine

## 2024-02-08 VITALS — BP 151/72 | HR 75 | Wt 79.0 lb

## 2024-02-08 DIAGNOSIS — F17200 Nicotine dependence, unspecified, uncomplicated: Secondary | ICD-10-CM

## 2024-02-08 DIAGNOSIS — K559 Vascular disorder of intestine, unspecified: Secondary | ICD-10-CM

## 2024-02-08 DIAGNOSIS — D259 Leiomyoma of uterus, unspecified: Secondary | ICD-10-CM

## 2024-02-08 DIAGNOSIS — J4489 Other specified chronic obstructive pulmonary disease: Secondary | ICD-10-CM

## 2024-02-08 DIAGNOSIS — F17218 Nicotine dependence, cigarettes, with other nicotine-induced disorders: Secondary | ICD-10-CM | POA: Diagnosis not present

## 2024-02-08 MED ORDER — NICOTINE POLACRILEX 2 MG MT GUM: 2.0000 mg | CHEWING_GUM | OROMUCOSAL | 1 refills | Status: DC | PRN

## 2024-02-08 MED ORDER — CLOPIDOGREL BISULFATE 75 MG PO TABS: 75.0000 mg | ORAL_TABLET | Freq: Every day | ORAL | 1 refills | Status: DC

## 2024-02-08 NOTE — Progress Notes (Signed)
 Established Patient Office Visit  Subjective   Patient ID: Christie Moreno, female    DOB: 1956-04-18  Age: 68 y.o. MRN: [id number]  Chief Complaint  Patient presents with   Medical Management of Chronic Issues    HPI  Subjective - Requesting assistance to stop smoking. Wants Nicorette  gum prescription sent to [facility] in [place] and [place]. Had issues with [facility] mailing it, as they prefer 90-day supplies. - Follow-up on CT abdomen/pelvis with contrast from 07/19/2023. Report received 12/15/2023. Notes recommendation for follow-up regarding a 3 cm exophytic fibroid on the uterus. Has an appointment with GYN tomorrow and will discuss this finding. - Discussed COPD management. Has the Breztri  inhaler but has not used it due to concerns about a seizure side effect. Was reassured this is not a common issue. Will start using it tonight. - Discussed low albumin. Has restarted nutritional supplements. - Question about continuing clopidogrel  (Plavix ). Has two pills left. Was started in December 2024 for mesenteric ischemia. - Discussed need for annual low-dose CT lung cancer screening. Has not had one previously, only a chest x-ray in the hospital.  Medications Current medications include aspirin , clopidogrel  (Plavix ), and a Breztri  inhaler (not yet started). Taking nutritional supplements for low albumin. Discussed albuterol  inhaler use.  PMH, PSH, FH, Social Hx PMHx: COPD, uterine fibroids, low albumin, mesenteric ischemia s/p stenting (December 2024), peripheral artery disease, history of seizures (on medication). Social Hx: Current smoker, seeking cessation.  ROS Constitutional: Denies new concerns. Cardiovascular: Denies chest pain. Respiratory: Reports ongoing breathing issues related to COPD. No acute exacerbation. GI: No new GI complaints. GU: No new GU complaints. Denies pain or excessive bleeding from fibroids. Endocrine: No new endocrine issues.     The  ASCVD Risk score (Arnett DK, et al., 2019) failed to calculate for the following reasons:   Risk score cannot be calculated because patient has a medical history suggesting prior/existing ASCVD  Health Maintenance Due  Topic Date Due   DTaP/Tdap/Td (1 - Tdap) Never done   Pneumococcal Vaccine: 50+ Years (1 of 2 - PCV) Never done   Zoster Vaccines- Shingrix (1 of 2) Never done   Lung Cancer Screening  11/03/2022   COVID-19 Vaccine (3 - 2024-25 season) 04/03/2023   MAMMOGRAM  11/26/2023   Medicare Annual Wellness (AWV)  03/16/2024      Objective:     BP (!) 151/72   Pulse 75   Wt 79 lb (35.8 kg)   SpO2 96%   BMI 14.93 kg/m    Physical Exam Gen: alert, oirented PULM: Lungs clear to auscultation bilaterally. No wheezes, rales, or rhonchi. Normal respiratory effort.   No results found for any visits on 02/08/24.      Assessment & Plan:   Tobacco dependence Assessment & Plan: - Continues to smoke but desires cessation. Previously discussed Nicorette  gum. Had difficulty obtaining it via mail order. - Send new prescription for Nicorette  gum to [facility] in [place] and [place].  Orders: -     CT CHEST LUNG CANCER SCREENING LOW DOSE WO CONTRAST; Future  Nicotine  dependence, cigarettes, with other nicotine -induced disorders -     CT CHEST LUNG CANCER SCREENING LOW DOSE WO CONTRAST; Future  COPD with chronic bronchitis (HCC) Assessment & Plan: - Stable. Has a Breztri  inhaler but was hesitant to use due to concerns about seizure risk. Reassured that this is not a known significant side effect. Advised to use Breztri  as it is a very effective medication for COPD,  especially since it is covered by insurance. Hopefully, this will reduce the need for the albuterol  rescue inhaler. - Continue albuterol  inhaler for rescue use as needed.   Uterine leiomyoma, unspecified location Assessment & Plan: - Incidental finding on CT abdomen/pelvis from 07/19/2023 of a 3 cm enhancing  exophytic fibroid. Asymptomatic, denies pain or abnormal bleeding. - Will follow up with her gynecologist tomorrow to discuss the finding and need for a pelvic ultrasound.   Mesenteric ischemia [facility]) Assessment & Plan: - History of mesenteric ischemia with stenting in December 2024. Currently on dual antiplatelet therapy with aspirin  and clopidogrel .I do not see mention of desired duration for DAPT, so will continue for 1 year - Send 7-month refill for clopidogrel . - Continue aspirin  and clopidogrel  for at least one year post-stenting (until December 2025).   Other orders -     Nicotine  Polacrilex; Take 1 each (2 mg total) by mouth as needed for smoking cessation.  Dispense: 100 tablet; Refill: 1 -     Clopidogrel  Bisulfate; Take 1 tablet (75 mg total) by mouth daily.  Dispense: 90 tablet; Refill: 1     Return in about 6 months (around 08/10/2024) for Smoking, PAD.    Toribio MARLA Slain, MD

## 2024-02-08 NOTE — Patient Instructions (Signed)
 It was nice to see you today,  We addressed the following topics today: -I have sent in your Nicorette  and your Plavix  to [facility]. - Continue taking both aspirin  and Plavix  until you see me again in January or your vascular surgeon tells you to stop - I have ordered a low-dose CT lung cancer screening test.  Someone will call you to schedule this at [facility] imaging.  Have a great day,  Rolan Slain, MD

## 2024-02-09 DIAGNOSIS — D259 Leiomyoma of uterus, unspecified: Secondary | ICD-10-CM | POA: Insufficient documentation

## 2024-02-09 LAB — HM MAMMOGRAPHY

## 2024-02-09 NOTE — Assessment & Plan Note (Addendum)
-   History of mesenteric ischemia with stenting in December 2024. Currently on dual antiplatelet therapy with aspirin  and clopidogrel .I do not see mention of desired duration for DAPT, so will continue for 1 year - Send 68-month refill for clopidogrel . - Continue aspirin  and clopidogrel  for at least one year post-stenting (until December 2025).

## 2024-02-09 NOTE — Assessment & Plan Note (Deleted)
-   History of mesenteric ischemia with stenting in December 2024. Currently on dual antiplatelet therapy with aspirin  and clopidogrel . Questioned need to continue clopidogrel  as prescription has no refills. - Send 64-month refill for clopidogrel . - Continue aspirin  and clopidogrel  for at least one year post-stenting (until December 2025). Will re-evaluate need for dual therapy in 6 months if not addressed by the vascular surgeon. Discussed that if one is stopped, it will likely be the aspirin .

## 2024-02-09 NOTE — Assessment & Plan Note (Signed)
-   Stable. Has a Breztri  inhaler but was hesitant to use due to concerns about seizure risk. Reassured that this is not a known significant side effect. Advised to use Breztri  as it is a very effective medication for COPD, especially since it is covered by insurance. Hopefully, this will reduce the need for the albuterol  rescue inhaler. - Continue albuterol  inhaler for rescue use as needed.

## 2024-02-09 NOTE — Assessment & Plan Note (Signed)
-   Continues to smoke but desires cessation. Previously discussed Nicorette  gum. Had difficulty obtaining it via mail order. - Send new prescription for Nicorette  gum to [facility] in [place] and [place].

## 2024-02-09 NOTE — Assessment & Plan Note (Signed)
-   Incidental finding on CT abdomen/pelvis from 07/19/2023 of a 3 cm enhancing exophytic fibroid. Asymptomatic, denies pain or abnormal bleeding. - Will follow up with her gynecologist tomorrow to discuss the finding and need for a pelvic ultrasound.

## 2024-02-15 ENCOUNTER — Telehealth: Payer: Self-pay | Admitting: Cardiovascular Disease

## 2024-02-15 MED ORDER — ATORVASTATIN CALCIUM 80 MG PO TABS: 80.0000 mg | ORAL_TABLET | Freq: Every day | ORAL | 1 refills | Status: DC

## 2024-02-15 NOTE — Telephone Encounter (Signed)
 RX sent to requested Pharmacy

## 2024-02-15 NOTE — Telephone Encounter (Signed)
*  STAT* If patient is at the pharmacy, call can be transferred to refill team.   1. Which medications need to be refilled? (please list name of each medication and dose if known) atorvastatin  (LIPITOR ) 80 MG tablet    2. Would you like to learn more about the convenience, safety, & potential cost savings by using the [facility]?      3. Are you open to using the [facility] (Type [facility].  ).   4. Which pharmacy/location (including street and city if local pharmacy) is medication to be sent to? [id number] - [place], [place] - [address] AT [facility] [address]    5. Do they need a 30 day or 90 day supply? 90 day

## 2024-02-17 ENCOUNTER — Other Ambulatory Visit: Payer: Self-pay

## 2024-02-18 NOTE — Patient Outreach (Signed)
 Complex Care Management   Visit Note  02/17/2024  Name:  Christie Moreno MRN: [id number] DOB: 06/12/1956  Situation: Referral received for Complex Care Management related to COPD and Atrial Fibrillation I obtained verbal consent from Patient.  Visit completed with patient  on the phone  Background:   Past Medical History:  Diagnosis Date   Complex partial seizure (HCC) 04/22/2015   Convulsions (HCC) 04/19/2013   Heart failure (HCC)    Hyperlipidemia    Hypertension    Seizures (HCC)    none in years, recently had medication change   Thyroid disease     Assessment:  Mrs. Christie Moreno continues to experience difficulties with her breathing during warmer temperatures. She has consulted with her physician, who addressed her concerns regarding the Breztri  inhaler and its association with seizures. Following this consultation, she has been using the inhaler as directed. Additionally, she plans to utilize the albuterol  inhaler as a rescue option. Mrs. Christie Moreno has also expressed her intention to obtain patches to assist in her efforts to quit smoking. Furthermore, she has requested additional coupons for Ensure. Patient Reported Symptoms:  Cognitive Cognitive Status: Able to follow simple commands, Alert and oriented to person, place, and time, Normal speech and language skills      Neurological Neurological Review of Symptoms: Headaches Neurological Management Strategies: Medication therapy Neurological Comment: She feels like it is a dull headache from Medication  HEENT HEENT Symptoms Reported: Nasal discharge HEENT Management Strategies: Medication therapy HEENT Comment: Ear feels like they need to be popped Ear problem(s)  Cardiovascular Cardiovascular Symptoms Reported: Swelling in legs or feet Do You Have a Working Readable Scale?: Yes Weight: 79 lb 6.4 oz (36 kg) Cardiovascular Self-Management Outcome: 4 (good)  Respiratory Respiratory Symptoms Reported: Shortness of  breath Additional Respiratory Details: smokes 2 cigarettes a day Respiratory Management Strategies: Medication therapy Respiratory Self-Management Outcome: 4 (good)  Endocrine Endocrine Symptoms Reported: No symptoms reported    Gastrointestinal Gastrointestinal Symptoms Reported: No symptoms reported      Genitourinary Genitourinary Symptoms Reported: Itching or irritation Other Genitourinary Symptoms: She ius on an antibiotic for UTI and hasa F?U appointment with Gynocologist Genitourinary Management Strategies: Medication therapy  Integumentary Integumentary Symptoms Reported: Bruising Additional Integumentary Details: From blood thinner Skin Management Strategies: Medication therapy  Musculoskeletal Additional Musculoskeletal Details: inactivity due to heat and breathing Musculoskeletal Management Strategies: Activity Falls in the past year?: No    Psychosocial       Quality of Family Relationships: supportive, involved Do you feel physically threatened by others?: No      02/17/2024    2:18 PM  Depression screen PHQ 2/9  Decreased Interest 1  Down, Depressed, Hopeless 1  PHQ - 2 Score 2  Altered sleeping 0  Tired, decreased energy 1  Feeling bad or failure about yourself  0  Trouble concentrating 0  Moving slowly or fidgety/restless 0  Suicidal thoughts 0  PHQ-9 Score 3    There were no vitals filed for this visit.  Medications Reviewed Today     Reviewed by Christie Corning, RN (Registered Nurse) on 02/17/24 at 1404  Med List Status: <None>   Medication Order Taking? Sig Documenting Provider Last Dose Status Informant  albuterol  (PROVENTIL ) (2.5 MG/3ML) 0.083% nebulizer solution [id number]  Take 3 mLs (2.5 mg total) by nebulization every 6 (six) hours as needed for wheezing or shortness of breath. Christie Moreno  Active   albuterol  (VENTOLIN  HFA) 108 [id number] Base) MCG/ACT inhaler [id number] Yes Inhale 2 puffs  into the lungs every 4 (four) hours as needed for wheezing  or shortness of breath. Christie Moreno  Active   aspirin  81 MG EC tablet [id number] Yes Take 1 tablet (81 mg total) by mouth daily. Swallow whole. Arrien, Christie Moreno  Active Self, Pharmacy Records, Multiple Informants  atorvastatin  (LIPITOR ) 80 MG tablet [id number] Yes Take 1 tablet (80 mg total) by mouth daily at 6 PM. Christie Moreno  Active   budeson-glycopyrrolate-formoterol (BREZTRI  AEROSPHERE) 160-9-4.8 MCG/ACT AERO inhaler [id number] Yes Inhale 2 puffs into the lungs 2 (two) times daily. Christie Moreno  Active   carvedilol  (COREG ) 3.125 MG tablet [id number] Yes TAKE 1 TABLET(3.125 MG) BY MOUTH TWICE DAILY WITH A MEAL Christie Moreno  Active   clopidogrel  (PLAVIX ) 75 MG tablet [id number] Yes Take 1 tablet (75 mg total) by mouth daily. Christie Moreno  Active   empagliflozin  (JARDIANCE ) 10 MG TABS tablet [id number] Yes TAKE 1 TABLET DAILY Christie Moreno  Active   estrogens, conjugated, (PREMARIN) 1.25 MG tablet [id number] Yes Take 1.25 mg by mouth daily. Provider, Historical, Moreno  Active Self, Pharmacy Records, Multiple Informants           Med Note Christie Moreno   Mon Nov 02, 2021  5:03 AM)    feeding supplement (ENSURE ENLIVE / ENSURE PLUS) LIQD [id number] Yes Take 237 mLs by mouth 2 (two) times daily between meals. Arrien, Christie Moreno  Active Self, Pharmacy Records, Multiple Informants           Med Note Christie Moreno   Fri Oct 28, 2023  9:19 AM) Pt admits she is not drinking it as much as she should   furosemide  (LASIX ) 40 MG tablet [id number] Yes Take 0.5 tablets (20 mg total) by mouth daily. TAKE ONE-HALF (1/2) TABLET (20 MG TOTAL) DAILY Christie Moreno  Active Self, Pharmacy Records, Multiple Informants           Med Note Christie Moreno   Fri Oct 28, 2023  9:20 AM)    lamoTRIgine  (LAMICTAL ) 100 MG tablet [id number] Yes Take 1 tablet (100 mg total) by mouth 2 (two) times daily. Christie Moreno  Active Self, Pharmacy Records,  Multiple Informants  levothyroxine  (SYNTHROID ) 25 MCG tablet [id number] Yes Take 1 tablet (25 mcg total) by mouth daily before breakfast. Christie Moreno  Active Self, Pharmacy Records, Multiple Informants  nicotine  polacrilex (NICORETTE ) 2 MG gum [id number] Yes Take 1 each (2 mg total) by mouth as needed for smoking cessation. Christie Moreno  Active   pantoprazole  (PROTONIX ) 40 MG tablet [id number] Yes Take 1 tablet (40 mg total) by mouth daily. Christie Moreno  Active   potassium chloride  SA (KLOR-CON  M) 20 MEQ tablet [id number] Yes TAKE 1 TABLET DAILY (KEEP UPCOMING APPOINTMENT FOR FUTURE REFILLS) Goodrich, Callie E, Moreno-C  Active Self, Pharmacy Records, Multiple Informants  progesterone (PROMETRIUM) 200 MG capsule [id number] Yes Take 200 mg by mouth at bedtime. Provider, Historical, Moreno  Active Self, Pharmacy Records, Multiple Informants            Recommendation:   Make sure to go to all of your appointments  Follow Up Plan:   Telephone follow up appointment with care management team member scheduled for:  03/19/24  215 pm with Rosaline Arno Wilbert Weyman RN, BSN, [facility]  [facility], [facility]    Care Coordinator Phone: [phone]

## 2024-02-18 NOTE — Patient Instructions (Signed)
 Visit Information  Thank you for taking time to visit with me today. Please don't hesitate to contact me if I can be of assistance to you before our next scheduled appointment.  Your next care management appointment is Telephone follow up appointment with care management team member scheduled for:  03/19/24  215 pmwith Rosaline Finlay  Please call the care guide team at [phone] if you need to cancel, schedule, or reschedule an appointment.   Please call the Suicide and Crisis Lifeline: 988 call the [place]  National Suicide Prevention Lifeline: (934)309-8414 or TTY: 680-482-8175 TTY 9187794297) to talk to a trained counselor call 1-800-273-TALK (toll free, 24 hour hotline) call 911 if you are experiencing a Mental Health or Behavioral Health Crisis or need someone to talk to.  Wilbert Diver RN, BSN, [facility]  [facility], [facility]    Care Coordinator Phone: [phone]

## 2024-02-21 ENCOUNTER — Ambulatory Visit
Admission: RE | Admit: 2024-02-21 | Discharge: 2024-02-21 | Disposition: A | Discharge status: Home / Self Care | Source: Ambulatory Visit | Attending: Family Medicine | Admitting: Family Medicine

## 2024-02-21 ENCOUNTER — Telehealth: Payer: Self-pay

## 2024-02-21 DIAGNOSIS — F17200 Nicotine dependence, unspecified, uncomplicated: Secondary | ICD-10-CM

## 2024-02-21 DIAGNOSIS — F17218 Nicotine dependence, cigarettes, with other nicotine-induced disorders: Secondary | ICD-10-CM

## 2024-02-21 NOTE — Telephone Encounter (Unsigned)
 Copied from CRM [id number]. Topic: General - Other >> Feb 21, 2024  4:28 PM Jayma L wrote: Reason for CRM: shelly with [facility] called in and stated patient is no longer getting a lung screening any more, that she needed to smoke 10 or more cigs a day and patient states she only smokes 2 a day. So lung screening has been canceled.

## 2024-02-22 NOTE — Telephone Encounter (Signed)
 Copied from CRM [id number]. Topic: General - Other >> Feb 22, 2024  3:08 PM Zebedee SAUNDERS wrote: Reason for CRM: Pt stated that imaging referral# [id number] [phone] - CT CHEST LUNG CANCER SCREENING LOW DOSE WO CONTRAST, imaging tech stated that pt did not meet criteria for imaging. Please contact pt at  [phone]. Redirect referral.

## 2024-02-23 ENCOUNTER — Other Ambulatory Visit: Payer: Self-pay | Admitting: Student

## 2024-02-29 ENCOUNTER — Telehealth: Payer: Self-pay | Admitting: Cardiovascular Disease

## 2024-02-29 NOTE — Telephone Encounter (Signed)
*  STAT* If patient is at the pharmacy, call can be transferred to refill team.   1. Which medications need to be refilled? (please list name of each medication and dose if known)   furosemide  (LASIX ) 40 MG tablet   2. Which pharmacy/location (including street and city if local pharmacy) is medication to be sent to? [facility] - [place], [place] - [address] Phone: [phone]  Fax: [phone]     3. Do they need a 30 day or 90 day supply? 90  Pt has 3 pills left. She would like a 10 tablets sent to   [id number] [place], [place] - [address] AT [facility] OF [facility] & [address] Phone: [phone]  Fax: [phone]     Until [facility]

## 2024-03-02 ENCOUNTER — Telehealth: Payer: Self-pay | Admitting: Cardiovascular Disease

## 2024-03-02 MED ORDER — FUROSEMIDE 40 MG PO TABS: 20.0000 mg | ORAL_TABLET | Freq: Every day | ORAL | 1 refills | Status: AC

## 2024-03-02 MED ORDER — FUROSEMIDE 40 MG PO TABS: 20.0000 mg | ORAL_TABLET | Freq: Every day | ORAL | 1 refills | Status: DC

## 2024-03-02 NOTE — Telephone Encounter (Signed)
*  STAT* If patient is at the pharmacy, call can be transferred to refill team.   1. Which medications need to be refilled? (please list name of each medication and dose if known)   furosemide  (LASIX ) 40 MG tablet   2. Would you like to learn more about the convenience, safety, & potential cost savings by using the [facility]?   3. Are you open to using the [facility] (Type [facility]. ).  4. Which pharmacy/location (including street and city if local pharmacy) is medication to be sent to?  [facility] - [place], [place] - [address]   5. Do they need a 30 day or 90 day supply?   90 day  Patient stated she is out of this medication and has also requested 10 tablets be sent to [facility].  Patient has appointment scheduled 8/28 with K. West.

## 2024-03-02 NOTE — Telephone Encounter (Signed)
Rx sent to [facility] per pt request

## 2024-03-02 NOTE — Telephone Encounter (Signed)
*  STAT* If patient is at the pharmacy, call can be transferred to refill team.   1. Which medications need to be refilled? (please list name of each medication and dose if known)   furosemide  (LASIX ) 40 MG tablet   2. Would you like to learn more about the convenience, safety, & potential cost savings by using the [facility]?   3. Are you open to using the [facility] (Type [facility]. ).  4. Which pharmacy/location (including street and city if local pharmacy) is medication to be sent to?  [id number] - [place], [place] - [address] AT [facility] OF [address]   5. Do they need a 30 day or 90 day supply?   10 tablets  Patient stated she is completely out of this medication and will need 10 tablets sent to [facility] until her prescription from [facility] comes in.

## 2024-03-02 NOTE — Addendum Note (Signed)
 Addended by: DARIO IZETTA CROME on: 03/02/2024 02:50 PM   Modules accepted: Orders

## 2024-03-02 NOTE — Telephone Encounter (Signed)
 Rx sent to pharmacy

## 2024-03-05 ENCOUNTER — Other Ambulatory Visit: Payer: Self-pay

## 2024-03-05 MED ORDER — ATORVASTATIN CALCIUM 80 MG PO TABS: 80.0000 mg | ORAL_TABLET | Freq: Every day | ORAL | 1 refills | Status: AC

## 2024-03-09 ENCOUNTER — Encounter: Payer: Self-pay | Admitting: Internal Medicine

## 2024-03-09 ENCOUNTER — Ambulatory Visit: Admitting: Internal Medicine

## 2024-03-09 VITALS — BP 112/68 | HR 58 | Ht 61.5 in | Wt 83.0 lb

## 2024-03-09 DIAGNOSIS — R1013 Epigastric pain: Secondary | ICD-10-CM | POA: Diagnosis not present

## 2024-03-09 DIAGNOSIS — Z8719 Personal history of other diseases of the digestive system: Secondary | ICD-10-CM

## 2024-03-09 DIAGNOSIS — K449 Diaphragmatic hernia without obstruction or gangrene: Secondary | ICD-10-CM

## 2024-03-09 DIAGNOSIS — K5900 Constipation, unspecified: Secondary | ICD-10-CM | POA: Diagnosis not present

## 2024-03-09 NOTE — Progress Notes (Signed)
 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 She was started on pantoprazole  as a trial to see if this helped with her symptoms, but patient never had any overt symptoms of acid reflux and is not sure that the PPI therapy is actually helping her. Thus I told her that it would be okay to stop her pantoprazole  for now. If she has a recurrence in her symptoms off of PPI therapy, patient will let me know and we can get this medication restarted for her. In the meantime her bowel habits have improved and she will continue to use laxatives PRN to help with constipation. - Continue hydration and Benefiber - Miralax  PRN - Okay to stop pantoprazole  - Next colonoscopy due in 07/2027 for follow up of cecal colitis and for colon cancer screening - RTC PRN   Patient Care Team: Gayle Saddie [name] as PCP - General (Physician Assistant) Court Dorn PARAS, MD as PCP - Cardiology (Cardiology) Pearline Norman RAMAN, MD as Consulting Physician (Vascular Surgery) Arno Rosaline SQUIBB, RN as Registered Nurse  HISTORY OF PRESENT ILLNESS: 68 y.o. female with a past medical history of seizures, coronary disease, tobacco use, PAD, SMA stenosis s/p SMA stent placement 07/2023 presents for follow up of ab pain   Interval History:  She is dealing with a vaginal yeast infection and was treated with fluconazole. However, she does not think that her symptoms have fully resolved. Denies ab pain. Her ab pain improved significantly after she had her SMA stenosis treated with a SMA  stent by vascular surgery. Bowel habits are regular. She does have Miralax  on hand as needed, and recently started taking Benefiber. Denies nausea. Denies acid reflux. She does continue to take pantoprazole  but she is not sure if this medication is helping. She has never had any symptoms of acid reflux to her knowledge.  Wt Readings from Last 8 Encounters:  03/09/24 83 lb (37.6 kg)  02/17/24 79 lb 6.4 oz (36 kg)  02/08/24 79 lb (35.8 kg)  12/30/23 78 lb 9.6 oz (35.7 kg)  12/23/23 77 lb 6.4 oz (35.1 kg)  12/15/23 75 lb 12.8 oz (34.4 kg)  11/24/23 75 lb (34 kg)  11/09/23 77 lb 12.8 oz (35.3 kg)   CT A/P w/o contrast 11/03/21: IMPRESSION: Small bilateral pleural effusions.  Bibasilar atelectasis. Coronary artery disease.  Aortic atherosclerosis. No definite acute process in the abdomen or pelvis.   RUQ U/S 11/03/21: IMPRESSION: 1. Unremarkable sonographic appearance of the liver. 2. Minimal layering sludge within the gallbladder. No gallstone or evidence of cholecystitis.   TTE 03/02/22: IMPRESSIONS   1. Left ventricular ejection fraction, by estimation, is 50 to 55%. The  left ventricle has low normal function. The left ventricle has no regional  wall motion abnormalities. Left ventricular diastolic parameters are  indeterminate. Elevated left atrial pressure.   2. Right ventricular systolic function is normal. The right ventricular  size is normal. There is normal pulmonary artery systolic pressure.   3. Left atrial size was mild to moderately dilated.   4. The mitral valve is degenerative. Mild  mitral valve regurgitation.  Mild mitral stenosis. The mean mitral valve gradient is 3.0 mmHg.   5. Functionally bicuspid. The left and non-coronary cusps are essentially  fused. The aortic valve is calcified. There is severe calcifcation of the  aortic valve. There is severe thickening of the aortic valve. Aortic valve  regurgitation is mild to  moderate. Mild aortic valve stenosis. Aortic  regurgitation PHT measures  414 msec. Aortic valve area, by VTI measures 1.03 cm. Aortic valve mean  gradient measures 12.0 mmHg. Aortic valve Vmax measures 2.34 m/s.   6. The inferior vena cava is normal in size with greater than 50%  respiratory variability, suggesting right atrial pressure of 3 mmHg.   CT A/p w/contrast 07/19/23: IMPRESSION: 1. Few prominent loops of small bowel in the pelvis have no associated wall thickening. There is no obstruction with enteric contrast reaching distal to this. Findings may represent focal ileus. 2. Advanced abdominal atherosclerosis. High-grade stenosis versus subtotal occlusion at the origin of the SMA which is likely segmentally occluded proximally. There is some distal reconstitution, however the vessels are heavily calcified. 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 Eccentric as well as circumferential mural thrombus in the abdominal aorta. 3. Wall thickening of the distal esophagus with small hiatal hernia. 4. Heterogeneous uterus suggests fibroids. Suspected exophytic fibroid posteriorly measuring 3.3 cm that is enhancing. Recommend nonemergent pelvic ultrasound for further evaluation.   Colonoscopy 07/19/22: - The examined portion of the ileum was normal. - Localized severe inflammation was found in the cecum. Biopsied. - One 3 mm polyp in the rectum, removed with a cold snare. Resected and retrieved. - Non-bleeding internal hemorrhoids. Path: 1. Surgical [P], right colon FOCAL ACUTE COLITIS WITH EROSION/ULCERATION NEGATIVE FOR DYSPLASIA AND GRANULOMAS 2. Surgical [P], left colon FOCAL ACUTE COLITIS/CRYPTITIS 3. Surgical [P], colon, rectum, polyp (1) HYPERPLASTIC POLYP WITH CHANGES OF MUCOSAL PROLAPSE NEGATIVE FOR DYSPLASIA AND CARCINOMA Microscopic Comment 1. -2. The right and left colon biopsies show multiple fragments of colonic mucosa. Within the right  colon biopsies several fragments exhibit mild architectural distortion in the form of elongated gated tortuous and branching crypts within a lamina propria showing an increased and focally mixed mononuclear cell infiltrate including neutrophils which infiltrate the epithelium. Within 1 fragment of the lamina propria is hyalinized with overlying surface erosion suggestive of a chronic ulcer. The left colon biopsies exhibit a normal crypt architecture with a patchy variable increased mononuclear cell infiltrate including neutrophils. Focally these neutrophils infiltrate the crypt epithelium. In both of the biopsies there is no increase in intraepithelial lymphocytes and the collagen table is of normal thickness. No granulomas or parasites are seen. And there is no evidence of dysplasia or carcinoma. Within the right colon biopsies architectural distortion is felt to be related to the erosion/ulceration and is otherwise nonspecific. These changes are not definitive for chronicity but evolving chronicity cannot be ruled out. Overall the findings of focal active colitis is commonly associated with a resolving acute self-limited colitis; however, certain drugs particularly NSAIDs may elicit similar changes. Early idiopathic inflammatory bowel disease may also present with these changes although this is not favored. Clinical, microbiologic and endoscopic correlation commended.   EGD 12/23/23: - Normal esophagus. - Small hiatal hernia. - Erythematous mucosa in the antrum. Biopsied. - Nodular mucosa in the duodenal bulb. Biopsied. Path: 1. Surgical [P], duodenal biopsies - nodular mucosa seen on exam :       -  DUODENAL MUCOSA WITH FOCAL OXYNTIC MUCOSA CONSISTENT WITH GASTRIC HETEROTOPIA       AND ADJACENT PROMINENT BRUNNER'S GLANDS AND FOVEOLAR METAPLASIA CONSISTENT WITH       ASSOCIATED CHRONIC PEPTIC DUODENITIS.       2. Surgical [P], gastric biopsies :       -  ANTRAL MUCOSA WITH CHEMICAL/REACTIVE CHANGE.        -  OXYNTIC MUCOSA WITH NO SIGNIFICANT PATHOLOGY.       -  NO HELICOBACTER PYLORI ORGANISMS IDENTIFIED ON H&E STAINED SLIDE.   She  reports that she has been smoking cigarettes. She started smoking about 54 years ago. She has a 52 pack-year smoking history. She has been exposed to tobacco smoke. She has never used smokeless tobacco. She reports that she does not currently use alcohol. She reports that she does not use drugs.  RELEVANT LABS AND IMAGING:  Results          CBC    Component Value Date/Time   WBC 11.0 (H) 10/29/2023 0354   RBC 3.84 (L) 10/29/2023 0354   HGB 10.2 (L) 10/29/2023 0354   HGB 13.7 06/10/2023 0926   HCT 31.4 (L) 10/29/2023 0354   HCT 42.6 06/10/2023 0926   PLT 236 10/29/2023 0354   PLT 171 06/10/2023 0926   MCV 81.8 10/29/2023 0354   MCV 93 06/10/2023 0926   MCH 26.6 10/29/2023 0354   MCHC 32.5 10/29/2023 0354   RDW 15.1 10/29/2023 0354   RDW 13.5 06/10/2023 0926   LYMPHSABS 0.5 (L) 10/29/2023 0354   LYMPHSABS 1.7 06/10/2023 0926   MONOABS 0.3 10/29/2023 0354   EOSABS 0.0 10/29/2023 0354   EOSABS 0.2 06/10/2023 0926   BASOSABS 0.0 10/29/2023 0354   BASOSABS 0.0 06/10/2023 0926   Recent Labs    05/06/23 1249 06/10/23 0926 07/10/23 0001 07/21/23 1227 07/22/23 0508 07/26/23 0308 07/31/23 1657 10/24/23 1740 10/27/23 1940 10/29/23 0354  HGB 14.3 13.7 13.7 14.3 11.9* 11.4* 11.2* 11.9* 11.5* 10.2*    CMP     Component Value Date/Time   NA 136 10/29/2023 0354   NA 136 06/10/2023 0926   K 3.9 10/29/2023 0354   CL 104 10/29/2023 0354   CO2 23 10/29/2023 0354   GLUCOSE 181 (H) 10/29/2023 0354   BUN 19 10/29/2023 0354   BUN 21 06/10/2023 0926   CREATININE 0.95 10/29/2023 0354   CALCIUM  8.5 (L) 10/29/2023 0354   PROT 6.5 10/24/2023 1740   PROT 6.0 06/10/2023 0926   ALBUMIN 2.5 (L) 10/29/2023 0354   ALBUMIN 3.9 06/10/2023 0926   AST 31 10/24/2023 1740   ALT 20 10/24/2023 1740   ALKPHOS 56 10/24/2023 1740   BILITOT 0.3 10/24/2023 1740    BILITOT 0.3 06/10/2023 0926   GFRNONAA >60 10/29/2023 0354   GFRAA 88 04/23/2019 1021      Latest Ref Rng & Units 10/29/2023    3:54 AM 10/24/2023    5:40 PM 07/31/2023    4:57 PM  Hepatic Function  Total Protein 6.5 - 8.1 g/dL  6.5  6.1   Albumin 3.5 - 5.0 g/dL 2.5  3.2  3.0   AST 15 - 41 U/L  31  38   ALT 0 - 44 U/L  20  37   Alk Phosphatase 38 - 126 U/L  56  55   Total Bilirubin 0.0 - 1.2 mg/dL  0.3  <9.7       Current Medications:   Current Outpatient Medications (Endocrine & Metabolic):  empagliflozin  (JARDIANCE ) 10 MG TABS tablet, TAKE 1 TABLET DAILY   estrogens, conjugated, (PREMARIN) 1.25 MG tablet, Take 1.25 mg by mouth daily.   levothyroxine  (SYNTHROID ) 25 MCG tablet, Take 1 tablet (25 mcg total) by mouth daily before breakfast.   progesterone (PROMETRIUM) 200 MG capsule, Take 200 mg by mouth at bedtime.  Current Outpatient Medications (Cardiovascular):    atorvastatin  (LIPITOR ) 80 MG tablet, Take 1 tablet (80 mg total) by mouth daily at 6 PM.   carvedilol  (COREG ) 3.125 MG tablet, TAKE 1 TABLET(3.125 MG) BY MOUTH TWICE DAILY WITH A MEAL   furosemide  (LASIX ) 40 MG tablet, Take 0.5 tablets (20 mg total) by mouth daily. TAKE ONE-HALF (1/2) TABLET (20 MG TOTAL) DAILY  Current Outpatient Medications (Respiratory):    albuterol  (PROVENTIL ) (2.5 MG/3ML) 0.083% nebulizer solution, Take 3 mLs (2.5 mg total) by nebulization every 6 (six) hours as needed for wheezing or shortness of breath.   albuterol  (VENTOLIN  HFA) 108 (90 Base) MCG/ACT inhaler, Inhale 2 puffs into the lungs every 4 (four) hours as needed for wheezing or shortness of breath.   budeson-glycopyrrolate-formoterol (BREZTRI  AEROSPHERE) 160-9-4.8 MCG/ACT AERO inhaler, Inhale 2 puffs into the lungs 2 (two) times daily.  Current Outpatient Medications (Analgesics):    aspirin  81 MG EC tablet, Take 1 tablet (81 mg total) by mouth daily. Swallow whole.   naproxen sodium (ALEVE) 220 MG tablet, Take 220 mg by mouth daily  as needed.  Current Outpatient Medications (Hematological):    clopidogrel  (PLAVIX ) 75 MG tablet, Take 1 tablet (75 mg total) by mouth daily.  Current Outpatient Medications (Other):    Calcium  Carbonate-Vit D-Min (CALCIUM  600+D PLUS MINERALS) 600-400 MG-UNIT CHEW, 1 tablet with food Orally Once a day; Duration: 30 day(s)   feeding supplement (ENSURE ENLIVE / ENSURE PLUS) LIQD, Take 237 mLs by mouth 2 (two) times daily between meals.   hydrOXYzine  (ATARAX ) 25 MG tablet, Take 25 mg by mouth every 8 (eight) hours as needed.   lamoTRIgine  (LAMICTAL ) 100 MG tablet, Take 1 tablet (100 mg total) by mouth 2 (two) times daily.   pantoprazole  (PROTONIX ) 40 MG tablet, Take 1 tablet (40 mg total) by mouth daily.   potassium chloride  SA (KLOR-CON  M) 20 MEQ tablet, TAKE 1 TABLET DAILY (KEEP UPCOMING APPOINTMENT FOR FUTURE REFILLS)   nicotine  polacrilex (NICORETTE ) 2 MG gum, Take 1 each (2 mg total) by mouth as needed for smoking cessation. (Patient not taking: Reported on 03/09/2024)  Medical History:  Past Medical History:  Diagnosis Date   Complex partial seizure (HCC) 04/22/2015   Convulsions (HCC) 04/19/2013   Heart failure (HCC)    Hyperlipidemia    Hypertension    Seizures (HCC)    none in years, recently had medication change   Thyroid disease    Allergies:  Allergies  Allergen Reactions   Azithromycin Nausea And Vomiting   Penicillins Itching and Rash   Percocet Amil.Babcock ] Rash     Surgical History:  She  has a past surgical history that includes RIGHT/LEFT HEART CATH AND CORONARY ANGIOGRAPHY (N/A, 11/05/2021); Colonoscopy; VISCERAL ANGIOGRAPHY (N/A, 07/25/2023); PERIPHERAL VASCULAR INTERVENTION (07/25/2023); and PERIPHERAL INTRAVASCULAR LITHOTRIPSY (07/25/2023). Family History:  Her family history includes Breast cancer in her maternal aunt and mother; Diabetes in her mother and sister; Heart Problems in her father; Kidney failure in her sister; Lung cancer in her  half-sister; Stroke in her mother. She was adopted.   PHYSICAL EXAM: BP 112/68   Pulse (!) 58   Ht 5' 1.5 (1.562 m)  Wt 83 lb (37.6 kg)   BMI 15.43 kg/m  General Appearance: pleasant, in no apparent distress. Head:   Normocephalic and atraumatic. Respiratory: Respiratory effort normal Cardio: RRR  Abdomen: Soft,  Flat , mildly tender to epigastric ab pain. Normal bowel sounds. Extremities: 1+ BLE edema Neuro: Alert and  oriented x4;  No focal deficits. Psych:  Cooperative. Normal mood and affect.    Rosario JAYSON Kidney, MD 11:24 AM  I spent 35 minutes of time, including in depth chart review, independent review of results as outlined above, communicating results with the patient directly, face-to-face time with the patient, coordinating care, ordering studies and medications as appropriate, and documentation.

## 2024-03-09 NOTE — Patient Instructions (Signed)
 Stop Protonix .  Follow as up as needed.  _______________________________________________________  If your blood pressure at your visit was 140/90 or greater, please contact your primary care physician to follow up on this.  _______________________________________________________  If you are age 70 or older, your body mass index should be between 23-30. Your Body mass index is 15.43 kg/m. If this is out of the aforementioned range listed, please consider follow up with your Primary Care Provider.  If you are age 49 or younger, your body mass index should be between 19-25. Your Body mass index is 15.43 kg/m. If this is out of the aformentioned range listed, please consider follow up with your Primary Care Provider.   ________________________________________________________  The [facility] providers would like to encourage you to use MYCHART to communicate with providers for non-urgent requests or questions.  Due to long hold times on the telephone, sending your provider a message by [facility] may be a faster and more efficient way to get a response.  Please allow 48 business hours for a response.  Please remember that this is for non-urgent requests.  _______________________________________________________  [facility] is using a team-based approach to care.  Your team is made up of your doctor and two to three APPS. Our APPS (Nurse Practitioners and Physician Assistants) work with your physician to ensure care continuity for you. They are fully qualified to address your health concerns and develop a treatment plan. They communicate directly with your gastroenterologist to care for you. Seeing the Advanced Practice Practitioners on your physician's team can help you by facilitating care more promptly, often allowing for earlier appointments, access to diagnostic testing, procedures, and other specialty referrals.

## 2024-03-12 ENCOUNTER — Ambulatory Visit (HOSPITAL_COMMUNITY)
Admission: RE | Admit: 2024-03-12 | Discharge: 2024-03-12 | Disposition: A | Discharge status: Home / Self Care | Payer: Medicare Other | Source: Ambulatory Visit | Attending: Cardiovascular Disease | Admitting: Cardiovascular Disease

## 2024-03-12 DIAGNOSIS — I5021 Acute systolic (congestive) heart failure: Secondary | ICD-10-CM | POA: Insufficient documentation

## 2024-03-12 DIAGNOSIS — I214 Non-ST elevation (NSTEMI) myocardial infarction: Secondary | ICD-10-CM | POA: Insufficient documentation

## 2024-03-12 DIAGNOSIS — I350 Nonrheumatic aortic (valve) stenosis: Secondary | ICD-10-CM | POA: Insufficient documentation

## 2024-03-12 DIAGNOSIS — F17200 Nicotine dependence, unspecified, uncomplicated: Secondary | ICD-10-CM | POA: Insufficient documentation

## 2024-03-12 DIAGNOSIS — E782 Mixed hyperlipidemia: Secondary | ICD-10-CM | POA: Insufficient documentation

## 2024-03-12 LAB — ECHOCARDIOGRAM COMPLETE
AR max vel: 0.81 cm2
AV Area VTI: 0.83 cm2
AV Area mean vel: 0.75 cm2
AV Mean grad: 18 mmHg
AV Peak grad: 29.3 mmHg
Ao pk vel: 2.71 m/s
Area-P 1/2: 3.3 cm2
MV M vel: 6.44 m/s
MV Peak grad: 165.9 mmHg
MV VTI: 1.15 cm2
P 1/2 time: 318 ms
S' Lateral: 2.5 cm

## 2024-03-13 ENCOUNTER — Ambulatory Visit: Payer: Self-pay | Admitting: Cardiovascular Disease

## 2024-03-19 ENCOUNTER — Other Ambulatory Visit: Payer: Self-pay

## 2024-03-19 NOTE — Patient Outreach (Signed)
 Complex Care Management   Visit Note  03/19/2024  Name:  Christie Moreno MRN: [id number] DOB: Feb 24, 1956  Situation: Referral received for Complex Care Management related to COPD I obtained verbal consent from Patient.  Visit completed with Barnie Rakers  on the phone  Background:   Past Medical History:  Diagnosis Date   Complex partial seizure (HCC) 04/22/2015   Convulsions (HCC) 04/19/2013   Heart failure (HCC)    Hyperlipidemia    Hypertension    Seizures (HCC)    none in years, recently had medication change   Thyroid disease     Assessment: Patient Reported Symptoms:  Cognitive Cognitive Status: Able to follow simple commands, Alert and oriented to person, place, and time, Normal speech and language skills Cognitive/Intellectual Conditions Management [RPT]: None reported or documented in medical history or problem list      Neurological Neurological Review of Symptoms: No symptoms reported Neurological Management Strategies: Medication therapy Neurological Comment: Patient reports occasional headaches  HEENT HEENT Symptoms Reported: Other: HEENT Management Strategies: Medication therapy Ear problem(s)  Cardiovascular Cardiovascular Symptoms Reported: Swelling in legs or feet (LLE swelling) Does patient have uncontrolled Hypertension?: No (Confirmed patient does have a BP cuff at home) Cardiovascular Management Strategies: Medication therapy, Routine screening, Weight management Do You Have a Working Readable Scale?: Yes Weight: 83 lb 6.4 oz (37.8 kg) (Patient reported) Cardiovascular Comment: Patient plans to try compression socks for swelling. She reports last time she wore them they causes blisters on her toes. Patient reports she elevates her legs when resting.  Respiratory Respiratory Symptoms Reported: Other:, Shortness of breath, Productive cough Other Respiratory Symptoms: Shortness of breath with exertion, relieved with rest. Occasional cough with mucus  production. Patient reports continued compliance with maintenance inhaler. She reports occasional PRN Albuterol  inhaler use.Has not required Albuterol  nebulizer in over a week. Additional Respiratory Details: Patient reports that she tried to get chest x-ray done as ordered but they turned her away because she didn't meet the criteria. Patient states that she left a message for PCP to make aware that chest x-ray was not completed, noted in chart review. Patient reports she smokes 3 cigarettes a day. She reports she would like to cut back more. She plans to purchase nicotine  gum and has 1-800-QUIT-NOW. Respiratory Management Strategies: Medication therapy  Endocrine Endocrine Symptoms Reported: Not assessed    Gastrointestinal Gastrointestinal Symptoms Reported: No symptoms reported Additional Gastrointestinal Details: Patient is drinking Ensure daily. Gastrointestinal Management Strategies: Diet modification    Genitourinary Genitourinary Symptoms Reported: Pain/burning with urination, Malodorous urine, Difficulty initiating stream, Itching or irritation Additional Genitourinary Details: Patient has had 2 visits with gynecologist since previous [facility] visit. Patient reports at last visit she was told that UTI/yeast infection was cleared. Advised to contact provider to report symptoms.    Integumentary Integumentary Symptoms Reported: Not assessed    Musculoskeletal Musculoskelatal Symptoms Reviewed: No symptoms reported Musculoskeletal Management Strategies: Adequate rest Falls in the past year?: Yes Number of falls in past year: 2 or more (None since previous [facility] visit) Was there an injury with Fall?: Yes Fall Risk Category Calculator: 3 Patient Fall Risk Level: High Fall Risk Patient at Risk for Falls Due to: History of fall(s) Fall risk Follow up: Falls evaluation completed, Education provided, Falls prevention discussed  Psychosocial Psychosocial Symptoms Reported: No symptoms reported             03/19/2024    2:41 PM  Depression screen PHQ 2/9  Decreased Interest 0  Down, Depressed, Hopeless  1  PHQ - 2 Score 1    There were no vitals filed for this visit.  Medications Reviewed Today     Reviewed by Arno Rosaline SQUIBB, RN (Registered Nurse) on 03/19/24 at 1431  Med List Status: <None>   Medication Order Taking? Sig Documenting Provider Last Dose Status Informant  albuterol  (PROVENTIL ) (2.5 MG/3ML) 0.083% nebulizer solution [id number] Yes Take 3 mLs (2.5 mg total) by nebulization every 6 (six) hours as needed for wheezing or shortness of breath. Chandra Toribio POUR, MD  Active   albuterol  (VENTOLIN  HFA) [phone] Base) MCG/ACT inhaler [id number] Yes Inhale 2 puffs into the lungs every 4 (four) hours as needed for wheezing or shortness of breath. Chandra Toribio POUR, MD  Active   aspirin  81 MG EC tablet [id number] Yes Take 1 tablet (81 mg total) by mouth daily. Swallow whole. Arrien, Elidia Toribio, MD  Active Self, Pharmacy Records, Multiple Informants  atorvastatin  (LIPITOR ) 80 MG tablet [id number] Yes Take 1 tablet (80 mg total) by mouth daily at 6 PM. Court Dorn PARAS, MD  Active   budeson-glycopyrrolate-formoterol (BREZTRI  AEROSPHERE) 160-9-4.8 MCG/ACT AERO inhaler [id number] Yes Inhale 2 puffs into the lungs 2 (two) times daily. Chandra Toribio POUR, MD  Active   Calcium  Carbonate-Vit D-Min (CALCIUM  600+D PLUS MINERALS) 600-400 MG-UNIT CHEW [id number] Yes 1 tablet with food Orally Once a day; Duration: 30 day(s) [provider]  Active   carvedilol  (COREG ) 3.125 MG tablet [id number] Yes TAKE 1 TABLET(3.125 MG) BY MOUTH TWICE DAILY WITH A MEAL Court Dorn PARAS, MD  Active   clopidogrel  (PLAVIX ) 75 MG tablet [id number] Yes Take 1 tablet (75 mg total) by mouth daily. Chandra Toribio POUR, MD  Active   empagliflozin  (JARDIANCE ) 10 MG TABS tablet [id number] Yes TAKE 1 TABLET DAILY Court Dorn PARAS, MD  Active   estrogens, conjugated, (PREMARIN) 1.25 MG tablet [id number] Yes Take 1.25 mg by  mouth daily. [provider]  Active Self, Pharmacy Records, Multiple Informants           Med Note REGINO, [name]   Mon Nov 02, 2021  5:03 AM)    feeding supplement (ENSURE ENLIVE / ENSURE PLUS) LIQD [id number] Yes Take 237 mLs by mouth 2 (two) times daily between meals. Arrien, Elidia Toribio, MD  Active Self, Pharmacy Records, Multiple Informants           Med Note STEFFI, [name]   Fri Oct 28, 2023  9:19 AM) Pt admits she is not drinking it as much as she should   furosemide  (LASIX ) 40 MG tablet [id number] Yes Take 0.5 tablets (20 mg total) by mouth daily. TAKE ONE-HALF (1/2) TABLET (20 MG TOTAL) DAILY Court Dorn PARAS, MD  Active   hydrOXYzine  (ATARAX ) 25 MG tablet [id number] Yes Take 25 mg by mouth every 8 (eight) hours as needed. [provider]  Active   lamoTRIgine  (LAMICTAL ) 100 MG tablet [id number] Yes Take 1 tablet (100 mg total) by mouth 2 (two) times daily. Gayland Lauraine PARAS, NP  Active Self, Pharmacy Records, Multiple Informants  levothyroxine  (SYNTHROID ) 25 MCG tablet [id number] Yes Take 1 tablet (25 mcg total) by mouth daily before breakfast. Wallace Joesph LABOR, PA  Active Self, Pharmacy Records, Multiple Informants  naproxen sodium (ALEVE) 220 MG tablet [id number] Yes Take 220 mg by mouth daily as needed. [provider]  Active   nicotine  polacrilex (NICORETTE ) 2 MG gum [id number]  Take 1 each (2 mg total) by mouth as needed for smoking cessation.  Patient not taking:  Reported on 03/19/2024   Chandra Toribio POUR, MD  Active   pantoprazole  (PROTONIX ) 40 MG tablet [id number] Yes Take 1 tablet (40 mg total) by mouth daily. Chandra Toribio POUR, MD  Active   potassium chloride  SA (KLOR-CON  M) 20 MEQ tablet [id number] Yes TAKE 1 TABLET DAILY (KEEP UPCOMING APPOINTMENT FOR FUTURE REFILLS) Court Dorn PARAS, MD  Active   progesterone (PROMETRIUM) 200 MG capsule [id number] Yes Take 200 mg by mouth at bedtime. [provider]  Active Self, Pharmacy Records, Multiple  Informants            Recommendation:   PCP Follow-up Specialty provider follow-up Patient will contact gynecologist to report continued UTI/yeast infection symptoms Continue Current Plan of Care Patient will pick up nicotine  gum and begin using  Follow Up Plan:   Telephone follow up appointment date/time:  04/16/24 at 2 PM with Channing Larry, RN  Rosaline Finlay, RN MSN [place]  [facility] RN Care Manager Direct Dial: [phone]  Fax: [phone]

## 2024-03-19 NOTE — Patient Instructions (Signed)
 Visit Information  Thank you for taking time to visit with me today. Please don't hesitate to contact me if I can be of assistance to you before our next scheduled appointment.  Your next care management appointment is by telephone on 04/16/24 at 2 PM with Cheryl Levesque, RN  Please call the care guide team at [phone] if you need to cancel, schedule, or reschedule an appointment.   Please call the Suicide and Crisis Lifeline: 988 call 1-800-273-TALK (toll free, 24 hour hotline) if you are experiencing a Mental Health or Behavioral Health Crisis or need someone to talk to.  Rosaline Finlay, RN MSN [place]  [facility] RN Care Manager Direct Dial: [phone]  Fax: [phone]

## 2024-03-22 ENCOUNTER — Ambulatory Visit: Payer: Medicare Other

## 2024-03-22 DIAGNOSIS — Z0000 Encounter for general adult medical examination without abnormal findings: Secondary | ICD-10-CM

## 2024-03-22 NOTE — Patient Instructions (Signed)
 Christie Moreno , Thank you for taking time out of your busy schedule to complete your Annual Wellness Visit with me. I enjoyed our conversation and look forward to speaking with you again next year. I, as well as your care team,  appreciate your ongoing commitment to your health goals. Please review the following plan we discussed and let me know if I can assist you in the future. Your Game plan/ To Do List    Referrals: If you haven't heard from the office you've been referred to, please reach out to them at the phone provided.   Follow up Visits: We will see or speak with you next year for your Next Medicare AWV with our clinical staff Have you seen your provider in the last 6 months (3 months if uncontrolled diabetes)? Yes  Clinician Recommendations:  Aim for 30 minutes of exercise or brisk walking, 6-8 glasses of water, and 5 servings of fruits and vegetables each day.       This is a list of the screenings recommended for you:  Health Maintenance  Topic Date Due   DTaP/Tdap/Td vaccine (1 - Tdap) Never done   Pneumococcal Vaccine for age over 56 (1 of 2 - PCV) Never done   Zoster (Shingles) Vaccine (1 of 2) Never done   Screening for Lung Cancer  11/03/2022   COVID-19 Vaccine (3 - 2024-25 season) 04/03/2023   Mammogram  11/26/2023   Flu Shot  03/02/2024   Medicare Annual Wellness Visit  03/22/2025   Colon Cancer Screening  07/20/2027   DEXA scan (bone density measurement)  Completed   Hepatitis C Screening  Completed   HPV Vaccine  Aged Out   Meningitis B Vaccine  Aged Out    Advanced directives: (Declined) Advance directive discussed with you today. Even though you declined this today, please call our office should you change your mind, and we can give you the proper paperwork for you to fill out. Advance Care Planning is important because it:  [x]  Makes sure you receive the medical care that is consistent with your values, goals, and preferences  [x]  It provides guidance to  your family and loved ones and reduces their decisional burden about whether or not they are making the right decisions based on your wishes.  Follow the link provided in your after visit summary or read over the paperwork we have mailed to you to help you started getting your Advance Directives in place. If you need assistance in completing these, please reach out to us  so that we can help you!  See attachments for Preventive Care and Fall Prevention Tips.

## 2024-03-22 NOTE — Progress Notes (Signed)
 Subjective:   Christie Moreno is a 68 y.o. who presents for a Medicare Wellness preventive visit.  As a reminder, Annual Wellness Visits don't include a physical exam, and some assessments may be limited, especially if this visit is performed virtually. We may recommend an in-person follow-up visit with your provider if needed.  Visit Complete: Virtual I connected with  Christie Moreno on 03/22/24 by a audio enabled telemedicine application and verified that I am speaking with the correct person using two identifiers.  Patient Location: Home  Provider Location: Home Office  I discussed the limitations of evaluation and management by telemedicine. The patient expressed understanding and agreed to proceed.  Vital Signs: Because this visit was a virtual/telehealth visit, some criteria may be missing or patient reported. Any vitals not documented were not able to be obtained and vitals that have been documented are patient reported.  VideoError- Librarian, academic were attempted between this provider and patient, however failed, due to patient having technical difficulties OR patient did not have access to video capability.  We continued and completed visit with audio only.   Persons Participating in Visit: Patient.  AWV Questionnaire: No: Patient Medicare AWV questionnaire was not completed prior to this visit.  Cardiac Risk Factors include: advanced age (>37men, >16 women);dyslipidemia     Objective:    Today's Vitals   03/22/24 1337  PainSc: 6    There is no height or weight on file to calculate BMI.     03/22/2024    1:55 PM 11/24/2023    3:50 PM 11/16/2023   11:32 AM 10/28/2023    8:02 AM 10/28/2023   12:00 AM 10/27/2023    7:29 PM 07/31/2023    4:39 PM  Advanced Directives  Does Patient Have a Medical Advance Directive? No No   No No No  Would patient like information on creating a medical advance directive? No - Patient declined No -  Patient declined No - Patient declined No - Patient declined       Current Medications (verified) Outpatient Encounter Medications as of 03/22/2024  Medication Sig   albuterol  (PROVENTIL ) (2.5 MG/3ML) 0.083% nebulizer solution Take 3 mLs (2.5 mg total) by nebulization every 6 (six) hours as needed for wheezing or shortness of breath.   albuterol  (VENTOLIN  HFA) 108 (90 Base) MCG/ACT inhaler Inhale 2 puffs into the lungs every 4 (four) hours as needed for wheezing or shortness of breath.   aspirin  81 MG EC tablet Take 1 tablet (81 mg total) by mouth daily. Swallow whole.   atorvastatin  (LIPITOR ) 80 MG tablet Take 1 tablet (80 mg total) by mouth daily at 6 PM.   budeson-glycopyrrolate-formoterol (BREZTRI  AEROSPHERE) 160-9-4.8 MCG/ACT AERO inhaler Inhale 2 puffs into the lungs 2 (two) times daily.   Calcium  Carbonate-Vit D-Min (CALCIUM  600+D PLUS MINERALS) 600-400 MG-UNIT CHEW 1 tablet with food Orally Once a day; Duration: 30 day(s)   carvedilol  (COREG ) 3.125 MG tablet TAKE 1 TABLET(3.125 MG) BY MOUTH TWICE DAILY WITH A MEAL   clopidogrel  (PLAVIX ) 75 MG tablet Take 1 tablet (75 mg total) by mouth daily.   empagliflozin  (JARDIANCE ) 10 MG TABS tablet TAKE 1 TABLET DAILY   estrogens, conjugated, (PREMARIN) 1.25 MG tablet Take 1.25 mg by mouth daily.   furosemide  (LASIX ) 40 MG tablet Take 0.5 tablets (20 mg total) by mouth daily. TAKE ONE-HALF (1/2) TABLET (20 MG TOTAL) DAILY   lamoTRIgine  (LAMICTAL ) 100 MG tablet Take 1 tablet (100 mg total) by mouth 2 (  two) times daily.   levothyroxine  (SYNTHROID ) 25 MCG tablet Take 1 tablet (25 mcg total) by mouth daily before breakfast.   potassium chloride  SA (KLOR-CON  M) 20 MEQ tablet TAKE 1 TABLET DAILY (KEEP UPCOMING APPOINTMENT FOR FUTURE REFILLS)   progesterone (PROMETRIUM) 200 MG capsule Take 200 mg by mouth at bedtime.   feeding supplement (ENSURE ENLIVE / ENSURE PLUS) LIQD Take 237 mLs by mouth 2 (two) times daily between meals. (Patient not taking:  Reported on 03/22/2024)   hydrOXYzine  (ATARAX ) 25 MG tablet Take 25 mg by mouth every 8 (eight) hours as needed. (Patient not taking: Reported on 03/22/2024)   naproxen sodium (ALEVE) 220 MG tablet Take 220 mg by mouth daily as needed. (Patient not taking: Reported on 03/22/2024)   nicotine  polacrilex (NICORETTE ) 2 MG gum Take 1 each (2 mg total) by mouth as needed for smoking cessation. (Patient not taking: Reported on 03/22/2024)   pantoprazole  (PROTONIX ) 40 MG tablet Take 1 tablet (40 mg total) by mouth daily. (Patient not taking: Reported on 03/22/2024)   No facility-administered encounter medications on file as of 03/22/2024.    Allergies (verified) Azithromycin, Penicillins, and Percocet [oxycodone-acetaminophen ]   History: Past Medical History:  Diagnosis Date   Complex partial seizure (HCC) 04/22/2015   Convulsions (HCC) 04/19/2013   Heart failure (HCC)    Hyperlipidemia    Hypertension    Seizures (HCC)    none in years, recently had medication change   Thyroid disease    Past Surgical History:  Procedure Laterality Date   COLONOSCOPY     PERIPHERAL INTRAVASCULAR LITHOTRIPSY  07/25/2023   Procedure: PERIPHERAL INTRAVASCULAR LITHOTRIPSY;  Surgeon: Pearline Norman RAMAN, MD;  Location: [facility] INVASIVE CV LAB;  Service: Cardiovascular;;   PERIPHERAL VASCULAR INTERVENTION  07/25/2023   Procedure: PERIPHERAL VASCULAR INTERVENTION;  Surgeon: Pearline Norman RAMAN, MD;  Location: [facility] INVASIVE CV LAB;  Service: Cardiovascular;;   RIGHT/LEFT HEART CATH AND CORONARY ANGIOGRAPHY N/A 11/05/2021   Procedure: RIGHT/LEFT HEART CATH AND CORONARY ANGIOGRAPHY;  Surgeon: Claudene Victory ORN, MD;  Location: [facility] INVASIVE CV LAB;  Service: Cardiovascular;  Laterality: N/A;   VISCERAL ANGIOGRAPHY N/A 07/25/2023   Procedure: VISCERAL ANGIOGRAPHY;  Surgeon: Pearline Norman RAMAN, MD;  Location[facility] INVASIVE CV LAB;  Service: Cardiovascular;  Laterality: N/A;   Family History  Adopted: Yes  Problem Relation Age of Onset    Breast cancer Mother    Diabetes Mother    Stroke Mother    Heart Problems Father    Diabetes Sister    Kidney failure Sister    Breast cancer Maternal Aunt    Lung cancer Half-Sister    Colon cancer Neg Hx    Esophageal cancer Neg Hx    Epilepsy Neg Hx    Stomach cancer Neg Hx    Rectal cancer Neg Hx    Social History   Socioeconomic History   Marital status: Significant Other    Spouse name: Gaither Salt   Number of children: 1   Years of education: Not on file   Highest education level: Bachelor's degree (e.g., BA, AB, BS)  Occupational History   Occupation: retired  Tobacco Use   Smoking status: Some Days    Current packs/day: 0.00    Average packs/day: 1 pack/day for 52.0 years (52.0 ttl pk-yrs)    Types: Cigarettes    Start date: 11/09/1969    Last attempt to quit: 11/09/2021    Years since quitting: 2.3    Passive exposure: Current   Smokeless tobacco:  Never   Tobacco comments:    08/31/2023 Patient smoke about 3 cigarettes daily    Currently 1 pack every 2-3 days    04/06/2022 patient states when she smokes its 1 cigarette    04/05/2023 Patient smokes 2 cigarettes some days   Vaping Use   Vaping status: Never Used  Substance and Sexual Activity   Alcohol use: Not Currently    Comment: socially   Drug use: No   Sexual activity: Not on file  Other Topics Concern   Not on file  Social History Narrative   Not on file   Social Drivers of Health   Financial Resource Strain: Low Risk  (03/22/2024)   Overall Financial Resource Strain (CARDIA)    Difficulty of Paying Living Expenses: Not hard at all  Food Insecurity: No Food Insecurity (03/22/2024)   Hunger Vital Sign    Worried About Running Out of Food in the Last Year: Never true    Ran Out of Food in the Last Year: Never true  Transportation Needs: No Transportation Needs (03/22/2024)   PRAPARE - Administrator, Civil Service (Medical): No    Lack of Transportation (Non-Medical): No  Physical  Activity: Inactive (03/22/2024)   Exercise Vital Sign    Days of Exercise per Week: 0 days    Minutes of Exercise per Session: 0 min  Stress: Stress Concern Present (03/22/2024)   [name] - Occupational Stress Questionnaire    Feeling of Stress: Rather much  Social Connections: Moderately Isolated (03/22/2024)   Social Connection and Isolation Panel    Frequency of Communication with Friends and Family: More than three times a week    Frequency of Social Gatherings with Friends and Family: Once a week    Attends Religious Services: Never    Database administrator or Organizations: No    Attends Banker Meetings: Never    Marital Status: Living with partner    Tobacco Counseling Ready to quit: Not Answered Counseling given: Not Answered Tobacco comments: 08/31/2023 Patient smoke about 3 cigarettes daily Currently 1 pack every 2-3 days 04/06/2022 patient states when she smokes its 1 cigarette 04/05/2023 Patient smokes 2 cigarettes some days     Clinical Intake:  Pre-visit preparation completed: Yes  Pain : 0-10 Pain Score: 6  Pain Type: Acute pain Pain Location: Genitalia Pain Descriptors / Indicators: Burning Pain Onset: More than a month ago Pain Frequency: Constant     Nutritional Risks: None Diabetes: No  Lab Results  Component Value Date   HGBA1C 5.9 (H) 06/10/2023   HGBA1C 5.7 (H) 11/02/2021     How often do you need to have someone help you when you read instructions, pamphlets, or other written materials from your doctor or pharmacy?: 1 - Never  Interpreter Needed?: No  Information entered by :: NAllen LPN   Activities of Daily Living     03/22/2024    1:43 PM 10/28/2023   12:00 AM  In your present state of health, do you have any difficulty performing the following activities:  Hearing? 1 0  Comment ears feel stuffed   Vision? 1 0  Comment not as sharp   Difficulty concentrating or making decisions? 0 0   Walking or climbing stairs? 0   Dressing or bathing? 0   Doing errands, shopping? 0 0  Preparing Food and eating ? N   Using the Toilet? N   In the past six months, have you  accidently leaked urine? N   Do you have problems with loss of bowel control? N   Managing your Medications? N   Managing your Finances? N   Housekeeping or managing your Housekeeping? N     Patient Care Team: Gayle Saddie [name] as PCP - General (Physician Assistant) Court Dorn PARAS, MD as PCP - Cardiology (Cardiology) Pearline Norman RAMAN, MD as Consulting Physician (Vascular Surgery) Steffens, Rosaline SQUIBB, RN as Registered Nurse Gordy Channing LABOR, RN as VBCI Care Management  I have updated your Care Teams any recent Medical Services you may have received from other providers in the past year.     Assessment:   This is a routine wellness examination for Christie Moreno.  Hearing/Vision screen Hearing Screening - Comments:: Ears feel stuffed Vision Screening - Comments:: No regular eye exams   Goals Addressed             This Visit's Progress    Patient Stated       03/22/2024, work on increasing exercise       Depression Screen     03/22/2024    1:58 PM 03/19/2024    2:41 PM 02/17/2024    2:18 PM 02/08/2024    4:15 PM 01/18/2024    4:07 PM 11/24/2023    3:26 PM 11/16/2023   11:29 AM  PHQ 2/9 Scores  PHQ - 2 Score 1 1 2  0 2 2 1   PHQ- 9 Score   3  4 6 3     Fall Risk     03/22/2024    1:56 PM 03/19/2024    2:39 PM 02/17/2024    2:17 PM 01/18/2024    4:04 PM 03/17/2023    1:22 PM  Fall Risk   Falls in the past year? 1 1 0 0 1  Comment fell out of bed, fell in the bath tub      Number falls in past yr: 1 1  1  0  Comment  None since previous CMR[facility] visit  banged her arrms and hit her face   Injury with Fall? 0 1  1 0  Risk for fall due to : Medication side effect History of fall(s)   No Fall Risks  Follow up Falls prevention discussed;Falls evaluation completed Falls evaluation completed;Education  provided;Falls prevention discussed  Falls evaluation completed;Education provided;Falls prevention discussed Falls prevention discussed    MEDICARE RISK AT HOME:  Medicare Risk at Home Any stairs in or around the home?: Yes If so, are there any without handrails?: No Home free of loose throw rugs in walkways, pet beds, electrical cords, etc?: Yes Adequate lighting in your home to reduce risk of falls?: Yes Life alert?: No Use of a cane, walker or w/c?: No Grab bars in the bathroom?: No Shower chair or bench in shower?: No Elevated toilet seat or a handicapped toilet?: No  TIMED UP AND GO:  Was the test performed?  No  Cognitive Function: 6CIT completed    02/03/2021    2:48 PM  MMSE - Mini Mental State Exam  Orientation to time 5  Orientation to Place 5  Registration 3  Attention/ Calculation 0  Recall 3  Language- name 2 objects 2  Language- repeat 1  Language- follow 3 step command 3  Language- read & follow direction 1  Write a sentence 1  Copy design 1  Total score 25        03/22/2024    1:58 PM 03/17/2023    1:24 PM 02/22/2022  1:51 PM  6CIT Screen  What Year? 0 points 0 points 0 points  What month? 0 points 0 points 0 points  What time? 3 points 0 points 0 points  Count back from 20 0 points 0 points 0 points  Months in reverse 0 points 4 points 0 points  Repeat phrase 2 points 6 points 2 points  Total Score 5 points 10 points 2 points    Immunizations Immunization History  Administered Date(s) Administered   Fluad Quad(high Dose 65+) 06/28/2022   Fluad Trivalent(High Dose 65+) 04/13/2023   PFIZER(Purple Top)SARS-COV-2 Vaccination 12/30/2019, 01/20/2020    Screening Tests Health Maintenance  Topic Date Due   DTaP/Tdap/Td (1 - Tdap) Never done   Pneumococcal Vaccine: 50+ Years (1 of 2 - PCV) Never done   Zoster Vaccines- Shingrix (1 of 2) Never done   Lung Cancer Screening  11/03/2022   COVID-19 Vaccine (3 - 2024-25 season) 04/03/2023    MAMMOGRAM  11/26/2023   INFLUENZA VACCINE  03/02/2024   Medicare Annual Wellness (AWV)  03/22/2025   Colonoscopy  07/20/2027   DEXA SCAN  Completed   Hepatitis C Screening  Completed   HPV VACCINES  Aged Out   Meningococcal B Vaccine  Aged Out    Health Maintenance  Health Maintenance Due  Topic Date Due   DTaP/Tdap/Td (1 - Tdap) Never done   Pneumococcal Vaccine: 50+ Years (1 of 2 - PCV) Never done   Zoster Vaccines- Shingrix (1 of 2) Never done   Lung Cancer Screening  11/03/2022   COVID-19 Vaccine (3 - 2024-25 season) 04/03/2023   MAMMOGRAM  11/26/2023   INFLUENZA VACCINE  03/02/2024   Health Maintenance Items Addressed: Due for vaccines.   Additional Screening:  Vision Screening: Recommended annual ophthalmology exams for early detection of glaucoma and other disorders of the eye. Would you like a referral to an eye doctor? No    Dental Screening: Recommended annual dental exams for proper oral hygiene  Community Resource Referral / Chronic Care Management: CRR required this visit?  No   CCM required this visit?  No   Plan:    I have personally reviewed and noted the following in the patient's chart:   Medical and social history Use of alcohol, tobacco or illicit drugs  Current medications and supplements including opioid prescriptions. Patient is not currently taking opioid prescriptions. Functional ability and status Nutritional status Physical activity Advanced directives List of other physicians Hospitalizations, surgeries, and ER visits in previous 12 months Vitals Screenings to include cognitive, depression, and falls Referrals and appointments  In addition, I have reviewed and discussed with patient certain preventive protocols, quality metrics, and best practice recommendations. A written personalized care plan for preventive services as well as general preventive health recommendations were provided to patient.   Ardella FORBES Dawn, LPN   [id number]    After Visit Summary: (Pick Up) Due to this being a telephonic visit, with patients personalized plan was offered to patient and patient has requested to Pick up at office.  Notes: Nothing significant to report at this time.

## 2024-03-27 NOTE — Progress Notes (Unsigned)
 Cardiology Office Note    Date:  03/29/2024  ID:  Christie Moreno, DOB 1956-02-23, MRN [id number] PCP:  Christie Saddie FALCON, PA-C  Cardiologist:  Christie Lesches, MD  Electrophysiologist:  None   Chief Complaint: Follow up for aortic stenosis   History of Present Illness: .    Christie Moreno is a 68 y.o. female with visit-pertinent history of NSTEMI, nonobstructive CAD, NICM, aortic stenosis, hyperlipidemia and tobacco use  Patient hospitalized in April 2023 in setting of NSTEMI, acute systolic heart failure.  Echo at the time indicated EF 20 to 25%, mild to moderately decreased LV function, G1 DD, mild LVH, mild mitral valve stenosis.  Cardiac catheterization revealed nonobstructive CAD.  Repeat echo in 03/2022 showed EF improved to 50 to 55%, mild mitral valve regurgitation, mild mitral stenosis, mild aortic stenosis, mean gradient 12 mmHg.  In 07/2023 patient underwent peripheral angiography by Dr. Pearline for mesenteric ischemia.  He performed intravascular lithotripsy followed by PTA and covered stenting.  Last echocardiogram on 03/12/2024 indicated LVEF 55 to 60%, no RWMA, G1 DD, RV systolic function and size was normal, mildly elevated PASP, LA was mild to moderately dilated, trivial mitral valve regurgitation, mild mitral stenosis, moderate to severe mitral annular calcification.  There was severe calcification of the aortic valve, aortic valve regurgitation was mild to moderate, moderate to severe paradoxical low-flow/low gradient aortic valve stenosis aortic valve area by VTI measured 0.83 cm, AV mean gradient measuring 18 mmHg.  Today she presents for follow-up.  She reports that she has been doing well, notes increased stress in recent weeks related to paperwork and her pets.  Patient denies any significant chest pain, does endorse increased dyspnea on exertion in recent months, notes she is unsure if this is related to possible increased tobacco use.  She reports that she does become  easily short of breath with exertion.  She denies any significant weight gain, orthopnea or pnd.  She does report increased left lower extremity swelling at her calf, reports that 3 days ago she noted increased swelling to the back of her calf as well as increased pain, denies any recent travel, significant erythema. ROS: .   Today she denies chest pain, fatigue, palpitations, melena, hematuria, hemoptysis, diaphoresis, weakness, presyncope, syncope, orthopnea, and PND.  All other systems are reviewed and otherwise negative. Studies Reviewed: SABRA   EKG:  EKG is ordered today, personally reviewed, demonstrating  EKG Interpretation Date/Time:  Thursday March 29 2024 13:39:54 EDT Ventricular Rate:  66 PR Interval:  172 QRS Duration:  82 QT Interval:  442 QTC Calculation: 463 R Axis:   111  Text Interpretation: Normal sinus rhythm Left posterior fascicular block Confirmed by Christie Moreno [phone]) on 03/29/2024 1:46:14 PM   CV Studies: Cardiac studies reviewed are outlined and summarized above. Otherwise please see EMR for full report. Cardiac Studies & Procedures   ______________________________________________________________________________________________ CARDIAC CATHETERIZATION  CARDIAC CATHETERIZATION 11/05/2021  Conclusion CONCLUSIONS Widely patent left main Widely patent LAD wrapping around the left ventricular apex.  Large first diagonal contains calcified eccentric 70 to 80% stenosis. [place] 111 mid circumflex bifurcation stenosis with with the second OM containing 30, 75, 40% stenoses. Diffuse irregularities up to 30% proximal to distal RCA.  PDA 30 to 40%. LVEDP 26 mmHg and in conjunction with mildly reduced LVEF of 45% by echo, acute on chronic combined systolic and diastolic heart failure is felt to be present. Mild pulmonary hypertension with mean PA pressure 24 mmHg and felt to be WHO  group 2. Normal cardiac index 2.76 L/min/m; PA O2 saturation 66%; AO saturation 96%; capillary  wedge pressure mean 19 mmHg.  RECOMMENDATION Guideline directed medical therapy for acute on chronic combined systolic and diastolic heart failure.  Considerations would be SGLT2, diuresis as needed, plus minus Entresto. Aggressive risk factor modification for coronary artery disease.  Findings Coronary Findings Diagnostic  Dominance: Right  Left Anterior Descending The vessel exhibits minimal luminal irregularities.  First Diagonal Branch 1st Diag lesion is 75% stenosed.  Second Diagonal Branch Vessel is small in size.  Left Circumflex The vessel exhibits minimal luminal irregularities. Mid Cx lesion is 40% stenosed. Mid Cx to Dist Cx lesion is 75% stenosed.  First Obtuse Marginal Branch Vessel is small in size.  Second Obtuse Marginal Branch 2nd Mrg lesion is 40% stenosed.  Right Coronary Artery There is moderate diffuse disease throughout the vessel.  Right Posterior Descending Artery Vessel is small in size. RPDA lesion is 30% stenosed.  First Right Posterolateral Branch Vessel is large in size.  Intervention  No interventions have been documented.     ECHOCARDIOGRAM  ECHOCARDIOGRAM COMPLETE 03/12/2024  Narrative ECHOCARDIOGRAM REPORT    Patient Name:   Christie Moreno Date of Exam: 03/12/2024 Medical Rec #:  [id number]          Height:       61.5 in Accession #:    [id number]         Weight:       83.0 lb Date of Birth:  [birth year]          BSA:          1.308 m Patient Age:    68 years           BP:           112/68 mmHg Patient Gender: F                  HR:           70 bpm. Exam Location:  [address]  Procedure: 2D Echo, 3D Echo, Cardiac Doppler and Color Doppler (Both Spectral and Color Flow Doppler were utilized during procedure).  Indications:    I50.21 CHF  History:        Patient has prior history of Echocardiogram examinations, most recent 03/15/2023. CHF, CAD; Risk Factors:Hypertension, Family History of Coronary Artery Disease,  Dyslipidemia and Current Smoker.  Sonographer:    Christie Moreno RDCS Referring Phys: Christie Moreno  IMPRESSIONS   1. Left ventricular ejection fraction, by estimation, is 55 to 60%. The left ventricle has normal function. The left ventricle has no regional wall motion abnormalities. Left ventricular diastolic parameters are consistent with Grade I diastolic dysfunction (impaired relaxation). 2. Right ventricular systolic function is normal. The right ventricular size is normal. There is mildly elevated pulmonary artery systolic pressure. The estimated right ventricular systolic pressure is 38.9 mmHg. 3. Left atrial size was mild to moderately dilated. 4. The mitral valve is degenerative. Trivial mitral valve regurgitation. Mild mitral stenosis. The mean mitral valve gradient is 3.0 mmHg. Moderate to severe mitral annular calcification. 5. The aortic valve is tricuspid. There is severe calcifcation of the aortic valve. Aortic valve regurgitation is mild to moderate. Moderate to severe paradoxical low flow/low gradient aortic valve stenosis. Aortic valve area, by VTI measures 0.83 cm. Aortic valve mean gradient measures 18.0 mmHg. 6. The inferior vena cava is normal in size with <50% respiratory variability, suggesting right atrial pressure of 8 mmHg.  FINDINGS Left Ventricle: Left ventricular ejection fraction, by estimation, is 55 to 60%. The left ventricle has normal function. The left ventricle has no regional wall motion abnormalities. The left ventricular internal cavity size was normal in size. There is no left ventricular hypertrophy. Left ventricular diastolic parameters are consistent with Grade I diastolic dysfunction (impaired relaxation).  Right Ventricle: The right ventricular size is normal. No increase in right ventricular wall thickness. Right ventricular systolic function is normal. There is mildly elevated pulmonary artery systolic pressure. The tricuspid regurgitant  velocity is 2.78 m/s, and with an assumed right atrial pressure of 8 mmHg, the estimated right ventricular systolic pressure is 38.9 mmHg.  Left Atrium: Left atrial size was mild to moderately dilated.  Right Atrium: Right atrial size was normal in size.  Pericardium: There is no evidence of pericardial effusion.  Mitral Valve: The mitral valve is degenerative in appearance. There is moderate calcification of the mitral valve leaflet(s). Moderate to severe mitral annular calcification. Trivial mitral valve regurgitation. Mild mitral valve stenosis. MV peak gradient, 10.2 mmHg. The mean mitral valve gradient is 3.0 mmHg.  Tricuspid Valve: The tricuspid valve is normal in structure. Tricuspid valve regurgitation is trivial.  Aortic Valve: The aortic valve is tricuspid. There is severe calcifcation of the aortic valve. Aortic valve regurgitation is mild to moderate. Aortic regurgitation PHT measures 318 msec. Moderate to severe aortic stenosis is present. Aortic valve mean gradient measures 18.0 mmHg. Aortic valve peak gradient measures 29.3 mmHg. Aortic valve area, by VTI measures 0.83 cm.  Pulmonic Valve: The pulmonic valve was normal in structure. Pulmonic valve regurgitation is not visualized.  Aorta: The aortic root is normal in size and structure.  Venous: The inferior vena cava is normal in size with less than 50% respiratory variability, suggesting right atrial pressure of 8 mmHg.  IAS/Shunts: No atrial level shunt detected by color flow Doppler.   LEFT VENTRICLE PLAX 2D LVIDd:         3.80 cm   Diastology LVIDs:         2.50 cm   LV e' medial:    6.31 cm/s LV PW:         1.05 cm   LV E/e' medial:  19.3 LV IVS:        0.95 cm   LV e' lateral:   6.74 cm/s LVOT diam:     1.95 cm   LV E/e' lateral: 18.0 LV SV:         48 LV SV Index:   37 LVOT Area:     2.99 cm  3D Volume EF: 3D EF:        62 % LV EDV:       120 ml LV ESV:       46 ml LV SV:        74 ml  RIGHT  VENTRICLE RV Basal diam:  3.10 cm RV S prime:     12.30 cm/s TAPSE (M-mode): 2.0 cm RVSP:           33.9 mmHg  LEFT ATRIUM             Index        RIGHT ATRIUM           Index LA diam:        3.30 cm 2.52 cm/m   RA Pressure: 3.00 mmHg LA Vol (A2C):   53.4 ml 40.84 ml/m  RA Area:     10.60 cm LA Vol (  A4C):   46.3 ml 35.41 ml/m  RA Volume:   25.50 ml  19.50 ml/m LA Biplane Vol: 54.2 ml 41.45 ml/m AORTIC VALVE AV Area (Vmax):    0.81 cm AV Area (Vmean):   0.75 cm AV Area (VTI):     0.83 cm AV Vmax:           270.50 cm/s AV Vmean:          188.000 cm/s AV VTI:            0.572 m AV Peak Grad:      29.3 mmHg AV Mean Grad:      18.0 mmHg LVOT Vmax:         72.93 cm/s LVOT Vmean:        46.933 cm/s LVOT VTI:          0.160 m LVOT/AV VTI ratio: 0.28 AI PHT:            318 msec  AORTA Ao Root diam: 2.60 cm Ao Asc diam:  2.80 cm  MITRAL VALVE                TRICUSPID VALVE MV Area (PHT)  cm          TR Peak grad:   30.9 mmHg MV Area VTI:   1.15 cm     TR Vmax:        278.00 cm/s MV Peak grad:  10.2 mmHg    Estimated RAP:  3.00 mmHg MV Mean grad:  3.0 mmHg     RVSP:           33.9 mmHg MV Vmax:       1.60 m/s MV Vmean:      81.1 cm/s    SHUNTS MV Decel Time: 230 msec     Systemic VTI:  0.16 m MR Peak grad: 165.9 mmHg    Systemic Diam: 1.95 cm MR Mean grad: 113.0 mmHg MR Vmax:      644.00 cm/s MR Vmean:     506.0 cm/s MV E velocity: 121.50 cm/s MV A velocity: 149.50 cm/s MV E/A ratio:  0.81  Dalton McleanMD Electronically signed by Ezra Kanner Signature Date/Time: 03/12/2024/8:12:20 PM    Final          ______________________________________________________________________________________________       Current Reported Medications:.    Current Meds  Medication Sig   albuterol  (PROVENTIL ) (2.5 MG/3ML) 0.083% nebulizer solution Take 3 mLs (2.5 mg total) by nebulization every 6 (six) hours as needed for wheezing or shortness of breath.   albuterol   (VENTOLIN  HFA) 108 (90 Base) MCG/ACT inhaler Inhale 2 puffs into the lungs every 4 (four) hours as needed for wheezing or shortness of breath.   aspirin  81 MG EC tablet Take 1 tablet (81 mg total) by mouth daily. Swallow whole.   atorvastatin  (LIPITOR ) 80 MG tablet Take 1 tablet (80 mg total) by mouth daily at 6 PM.   budeson-glycopyrrolate-formoterol (BREZTRI  AEROSPHERE) 160-9-4.8 MCG/ACT AERO inhaler Inhale 2 puffs into the lungs 2 (two) times daily.   carvedilol  (COREG ) 3.125 MG tablet TAKE 1 TABLET(3.125 MG) BY MOUTH TWICE DAILY WITH A MEAL   clopidogrel  (PLAVIX ) 75 MG tablet Take 1 tablet (75 mg total) by mouth daily.   empagliflozin  (JARDIANCE ) 10 MG TABS tablet TAKE 1 TABLET DAILY   estrogens, conjugated, (PREMARIN) 1.25 MG tablet Take 1.25 mg by mouth daily.   feeding supplement (ENSURE ENLIVE / ENSURE PLUS) LIQD Take 237 mLs by mouth 2 (two)  times daily between meals.   furosemide  (LASIX ) 40 MG tablet Take 0.5 tablets (20 mg total) by mouth daily. TAKE ONE-HALF (1/2) TABLET (20 MG TOTAL) DAILY   lamoTRIgine  (LAMICTAL ) 100 MG tablet Take 1 tablet (100 mg total) by mouth 2 (two) times daily.   levothyroxine  (SYNTHROID ) 25 MCG tablet Take 1 tablet (25 mcg total) by mouth daily before breakfast.   potassium chloride  SA (KLOR-CON  M) 20 MEQ tablet TAKE 1 TABLET DAILY (KEEP UPCOMING APPOINTMENT FOR FUTURE REFILLS)   progesterone (PROMETRIUM) 200 MG capsule Take 200 mg by mouth at bedtime.   Physical Exam:    VS:  BP 112/64   Pulse 66   Ht 5' 1.5 (1.562 m)   Wt 85 lb (38.6 kg)   BMI 15.80 kg/m    Wt Readings from Last 3 Encounters:  03/29/24 85 lb (38.6 kg)  03/19/24 83 lb 6.4 oz (37.8 kg)  03/09/24 83 lb (37.6 kg)    GEN: Well nourished, well developed in no acute distress NECK: No JVD; No carotid bruits CARDIAC: RRR, 3/6 systolic murmur, no rubs or gallops RESPIRATORY:  Clear to auscultation without rales, wheezing or rhonchi  ABDOMEN: Soft, non-tender, non-distended EXTREMITIES:   No edema; No acute deformity     Asessement and Plan:.    Aortic valve stenosis: Echocardiogram on 03/12/2024 indicated LVEF 55 to 60%, no RWMA, G1 DD, RV systolic function and size was normal, mildly elevated PASP, LA was mild to moderately dilated, trivial mitral valve regurgitation, mild mitral stenosis, moderate to severe mitral annular calcification.  There was severe calcification of the aortic valve, aortic valve regurgitation was mild to moderate, moderate to severe paradoxical low-flow/low gradient aortic valve stenosis aortic valve area by VTI measured 0.83 cm, AV mean gradient measuring 18 mmHg. Today she reports increased shortness of breath on exertion, patient with 3/6 systolic murmur.  Previously discussed with Dr. Court given echocardiogram results, he recommended referral to structural heart clinic.  Discussed with patient results and recommendation, patient in agreement and would appreciate referral to structural heart clinic.  Continue aspirin  81 mg daily, carvedilol  3.125 mg twice daily, Plavix  any 5 mg daily, Jardiance  10 mg daily, Lasix  20 mg daily.   Left lower extremity swelling: Patient reported increased left lower extremity swelling, localized to the calf that started 3 to 4 days ago, also reports increased pain, denies significant erythema.  She denies any recent travel and is overall active.  She notes that she did hit her shin a few weeks prior however swelling started in the last week.  She denies any recent travel.  Lower extremity venous duplex was obtained, negative for DVT.  CAD: Cath in 2023 revealed nonobstructive CAD.  Today she denies any chest pain, does note some increased dyspnea on exertion as noted above.  Reviewed ED precautions.  Continue aspirin  81 mg daily, Lipitor  80 mg daily, carvedilol  3.125 mg twice daily, Plavix  75 mg daily, Jardiance  10 mg daily, Lasix  20 mg daily. Check CBC and BMET.   NICM: Prior echocardiogram in setting of NSTEMI in 2023 indicated  EF 20 to 25%, cardiac cath revealed nonobstructive CAD.  Hypertension: Blood pressure today 112/64.  Continue carvedilol  3.125 mg twice daily, Jardiance  10 mg daily.   Mesenteric ischemia: History of mesenteric ischemia s/p SMA PTA and VBX covered stenting by Dr. Pearline on 07/25/2023.  Hyperlipidemia: Last lipid profile on 06/10/2023 indicated total cholesterol 150, HDL 84, glycerides 77 and LDL 51.  Continue atorvastatin  80 mg daily.  Tobacco use:  Patient reports that she has started smoking half a pack a day.  Complete cessation encouraged.  Hypothyroidism: On synthroid  monitored and managed per PCP.    Disposition: F/u with [facility].   Signed, Jaqulyn Chancellor D Jaslene Marsteller, NP

## 2024-03-29 ENCOUNTER — Ambulatory Visit
Admission: RE | Admit: 2024-03-29 | Discharge: 2024-03-29 | Disposition: A | Discharge status: Home / Self Care | Source: Ambulatory Visit | Attending: Cardiology | Admitting: Cardiology

## 2024-03-29 ENCOUNTER — Encounter: Payer: Self-pay | Admitting: Cardiology

## 2024-03-29 ENCOUNTER — Ambulatory Visit (INDEPENDENT_AMBULATORY_CARE_PROVIDER_SITE_OTHER): Admitting: Cardiology

## 2024-03-29 VITALS — BP 112/64 | HR 66 | Ht 61.5 in | Wt 85.0 lb

## 2024-03-29 DIAGNOSIS — I2510 Atherosclerotic heart disease of native coronary artery without angina pectoris: Secondary | ICD-10-CM | POA: Insufficient documentation

## 2024-03-29 DIAGNOSIS — E785 Hyperlipidemia, unspecified: Secondary | ICD-10-CM

## 2024-03-29 DIAGNOSIS — I350 Nonrheumatic aortic (valve) stenosis: Secondary | ICD-10-CM

## 2024-03-29 DIAGNOSIS — M7989 Other specified soft tissue disorders: Secondary | ICD-10-CM

## 2024-03-29 DIAGNOSIS — Z720 Tobacco use: Secondary | ICD-10-CM

## 2024-03-29 DIAGNOSIS — K559 Vascular disorder of intestine, unspecified: Secondary | ICD-10-CM | POA: Insufficient documentation

## 2024-03-29 DIAGNOSIS — I5032 Chronic diastolic (congestive) heart failure: Secondary | ICD-10-CM

## 2024-03-29 NOTE — Patient Instructions (Addendum)
 Medication Instructions:  No changes *If you need a refill on your cardiac medications before your next appointment, please call your pharmacy*  Lab Work: Today we are going to have a CBC and Bmet If you have labs (blood work) drawn today and your tests are completely normal, you will receive your results only by: MyChart Message (if you have MyChart) OR A paper copy in the mail If you have any lab test that is abnormal or we need to change your treatment, we will call you to review the results.  Testing/Procedures: No testing  Follow-Up: At [facility], you and your health needs are our priority.  As part of our continuing mission to provide you with exceptional heart care, our providers are all part of one team.  This team includes your primary Cardiologist (physician) and Advanced Practice Providers or APPs (Physician Assistants and Nurse Practitioners) who all work together to provide you with the care you need, when you need it.  Your next appointment:   6 month(s)  Provider:   Dorn Lesches, MD or Katlyn West, NP  Other Appointments: Patient needs to be scheduled for structural heart

## 2024-03-30 ENCOUNTER — Ambulatory Visit: Payer: Self-pay | Admitting: Cardiology

## 2024-03-30 NOTE — H&P (View-Only) (Signed)
 Patient ID: Christie Moreno MRN: [id number] DOB/AGE: 08/09/1955 68 y.o.  Primary Care Physician:Clapp, Saddie [place], [place] Primary Cardiologist: Court Referring provider: Devora  CC:  Aortic valvular disease management     FOCUSED PROBLEM LIST:   Aortic stenosis AVA 0.83, MG 18, SVI 37, EF 55 to 60% TTE August 2025 EKG sinus rhythm with left posterior fascicular block NICM, now resolved EF 40 to 45% TTE 2023 >> medical therapy EF 55 to 60% TTE August 2025 Diastolic dysfunction G1 DD, PLFLGAS, EF 55 to 60% TTE August 2025 CAD D1 70 to 80%, LCx/OM 2 bifurcation disease, distal RCA disease coronary angiography 2023 Hyperlipidemia Aortic atherosclerosis CT abdomen pelvis 2024 Peripheral vascular disease followed by Dr. Pearline Mesenteric ischemia >> SMA balloon angioplasty and stenting December 2024 Rest pain left lower extremity with occlusion of left common and external iliac arteries Hypothyroidism Tobacco abuse BMI 15/BSA 1.04 April 2024:  Patient consents to use of AI scribe. The patient is a 68 year old female with above listed medical problems referred for recommendations regarding her aortic valvular disease.  She has a history of a mild nonischemic cardiomyopathy with ejection fraction of 40 to 45% in 2023.  Coronary angiography demonstrated moderate disease of multiple vessels.  She was started on medical therapy with improvement in her ejection fraction.  Her last echocardiogram demonstrated paradoxical low-flow low gradient aortic stenosis.  At her last appointment with cardiology in August she reported increasing shortness of breath with exertion.  She experiences occasional shortness of breath, particularly with rapid movement, but not consistently with walking. She was able to walk from the elevator to the office without difficulty. She also experiences occasional wheezing and uses Breztri  and a nebulizer, though she has not used the nebulizer recently. No current chest  pain, lightheadedness, or syncope, but she had a seizure years ago. She reports occasional chest discomfort described as 'pressure' occurring twice while walking around the house.  She experiences swelling in her left leg, which is not influenced by activity level. The leg has a discoloration but is not painful, and there are no poorly healing sores. She has been wearing compression socks, but they caused bleeding in her toes, possibly due to incorrect sizing. She has a plantar wart on her right heel, which she is treating herself.  She has a history of smoking and is trying to quit, currently smoking about one cigarette a day, using it as a stress reliever.  She has both upper and lower dentures.  No dental complaints.        Past Medical History:  Diagnosis Date   Complex partial seizure (HCC) 04/22/2015   Convulsions (HCC) 04/19/2013   Heart failure (HCC)    Hyperlipidemia    Hypertension    Seizures (HCC)    none in years, recently had medication change   Thyroid disease     Past Surgical History:  Procedure Laterality Date   COLONOSCOPY     PERIPHERAL INTRAVASCULAR LITHOTRIPSY  07/25/2023   Procedure: PERIPHERAL INTRAVASCULAR LITHOTRIPSY;  Surgeon: Pearline Norman RAMAN, MD;  Location: [facility] INVASIVE CV LAB;  Service: Cardiovascular;;   PERIPHERAL VASCULAR INTERVENTION  07/25/2023   Procedure: PERIPHERAL VASCULAR INTERVENTION;  Surgeon: Pearline Norman RAMAN, MD;  Location: [facility] INVASIVE CV LAB;  Service: Cardiovascular;;   RIGHT/LEFT HEART CATH AND CORONARY ANGIOGRAPHY N/A 11/05/2021   Procedure: RIGHT/LEFT HEART CATH AND CORONARY ANGIOGRAPHY;  Surgeon: Claudene Victory ORN, MD;  Location: [facility] INVASIVE CV LAB;  Service: Cardiovascular;  Laterality: N/A;   VISCERAL  ANGIOGRAPHY N/A 07/25/2023   Procedure: VISCERAL ANGIOGRAPHY;  Surgeon: Pearline Norman RAMAN, MD;  Location: [facility] INVASIVE CV LAB;  Service: Cardiovascular;  Laterality: N/A;    Family History  Adopted: Yes  Problem Relation Age of Onset    Breast cancer Mother    Diabetes Mother    Stroke Mother    Heart Problems Father    Diabetes Sister    Kidney failure Sister    Breast cancer Maternal Aunt    Lung cancer Half-Sister    Colon cancer Neg Hx    Esophageal cancer Neg Hx    Epilepsy Neg Hx    Stomach cancer Neg Hx    Rectal cancer Neg Hx     Social History   Socioeconomic History   Marital status: Significant Other    Spouse name: Gaither Salt   Number of children: 1   Years of education: Not on file   Highest education level: Bachelor's degree (e.g., BA, AB, BS)  Occupational History   Occupation: retired  Tobacco Use   Smoking status: Some Days    Current packs/day: 0.00    Average packs/day: 1 pack/day for 52.0 years (52.0 ttl pk-yrs)    Types: Cigarettes    Start date: 11/09/1969    Last attempt to quit: 11/09/2021    Years since quitting: 2.4    Passive exposure: Current   Smokeless tobacco: Never   Tobacco comments:    08/31/2023 Patient smoke about 3 cigarettes daily    Currently 1 pack every 2-3 days    04/06/2022 patient states when she smokes its 1 cigarette    04/05/2023 Patient smokes 2 cigarettes some days   Vaping Use   Vaping status: Never Used  Substance and Sexual Activity   Alcohol use: Not Currently    Comment: socially   Drug use: No   Sexual activity: Not on file  Other Topics Concern   Not on file  Social History Narrative   Not on file   Social Drivers of Health   Financial Resource Strain: Low Risk  (03/22/2024)   Overall Financial Resource Strain (CARDIA)    Difficulty of Paying Living Expenses: Not hard at all  Food Insecurity: No Food Insecurity (03/22/2024)   Hunger Vital Sign    Worried About Running Out of Food in the Last Year: Never true    Ran Out of Food in the Last Year: Never true  Transportation Needs: No Transportation Needs (03/22/2024)   PRAPARE - Administrator, Civil Service (Medical): No    Lack of Transportation (Non-Medical): No  Physical  Activity: Inactive (03/22/2024)   Exercise Vital Sign    Days of Exercise per Week: 0 days    Minutes of Exercise per Session: 0 min  Stress: Stress Concern Present (03/22/2024)   [name] - Occupational Stress Questionnaire    Feeling of Stress: Rather much  Social Connections: Moderately Isolated (03/22/2024)   Social Connection and Isolation Panel    Frequency of Communication with Friends and Family: More than three times a week    Frequency of Social Gatherings with Friends and Family: Once a week    Attends Religious Services: Never    Database administrator or Organizations: No    Attends Banker Meetings: Never    Marital Status: Living with partner  Intimate Partner Violence: Not At Risk (03/22/2024)   Humiliation, Afraid, Rape, and Kick questionnaire    Fear of Current or Ex-Partner:  No    Emotionally Abused: No    Physically Abused: No    Sexually Abused: No     Prior to Admission medications   Medication Sig Start Date End Date Taking? Authorizing Provider  albuterol  (PROVENTIL ) (2.5 MG/3ML) 0.083% nebulizer solution Take 3 mLs (2.5 mg total) by nebulization every 6 (six) hours as needed for wheezing or shortness of breath. 11/09/23   Chandra Toribio POUR, MD  albuterol  (VENTOLIN  HFA) [phone] Base) MCG/ACT inhaler Inhale 2 puffs into the lungs every 4 (four) hours as needed for wheezing or shortness of breath. 12/20/23   Chandra Toribio POUR, MD  aspirin  81 MG EC tablet Take 1 tablet (81 mg total) by mouth daily. Swallow whole. 11/06/21   Arrien, Mauricio Daniel, MD  atorvastatin  (LIPITOR ) 80 MG tablet Take 1 tablet (80 mg total) by mouth daily at 6 PM. 03/05/24   Court Dorn PARAS, MD  budeson-glycopyrrolate-formoterol (BREZTRI  AEROSPHERE) 160-9-4.8 MCG/ACT AERO inhaler Inhale 2 puffs into the lungs 2 (two) times daily. 11/09/23   Chandra Toribio POUR, MD  Calcium  Carbonate-Vit D-Min (CALCIUM  600+D PLUS MINERALS) 600-400 MG-UNIT CHEW 1 tablet with food  Orally Once a day; Duration: 30 day(s) Patient not taking: Reported on 03/29/2024 01/10/12   [provider]  carvedilol  (COREG ) 3.125 MG tablet TAKE 1 TABLET(3.125 MG) BY MOUTH TWICE DAILY WITH A MEAL 12/01/23   Court Dorn PARAS, MD  clopidogrel  (PLAVIX ) 75 MG tablet Take 1 tablet (75 mg total) by mouth daily. 02/08/24   Chandra Toribio POUR, MD  empagliflozin  (JARDIANCE ) 10 MG TABS tablet TAKE 1 TABLET DAILY 01/03/24   Court Dorn PARAS, MD  estrogens, conjugated, (PREMARIN) 1.25 MG tablet Take 1.25 mg by mouth daily.    [provider]  feeding supplement (ENSURE ENLIVE / ENSURE PLUS) LIQD Take 237 mLs by mouth 2 (two) times daily between meals. 11/06/21   Arrien, Elidia Toribio, MD  furosemide  (LASIX ) 40 MG tablet Take 0.5 tablets (20 mg total) by mouth daily. TAKE ONE-HALF (1/2) TABLET (20 MG TOTAL) DAILY 03/02/24   Court Dorn PARAS, MD  hydrOXYzine  (ATARAX ) 25 MG tablet Take 25 mg by mouth every 8 (eight) hours as needed. Patient not taking: Reported on 03/29/2024    [provider]  lamoTRIgine  (LAMICTAL ) 100 MG tablet Take 1 tablet (100 mg total) by mouth 2 (two) times daily. 08/22/23   Gayland Lauraine PARAS, NP  levothyroxine  (SYNTHROID ) 25 MCG tablet Take 1 tablet (25 mcg total) by mouth daily before breakfast. 06/24/23   Wallace Search A, PA  naproxen sodium (ALEVE) 220 MG tablet Take 220 mg by mouth daily as needed. Patient not taking: Reported on 03/29/2024    [provider]  nicotine  polacrilex (NICORETTE ) 2 MG gum Take 1 each (2 mg total) by mouth as needed for smoking cessation. Patient not taking: Reported on 03/29/2024 02/08/24   Chandra Toribio POUR, MD  pantoprazole  (PROTONIX ) 40 MG tablet Take 1 tablet (40 mg total) by mouth daily. Patient not taking: Reported on 03/29/2024 12/20/23   Chandra Toribio POUR, MD  potassium chloride  SA (KLOR-CON  M) 20 MEQ tablet TAKE 1 TABLET DAILY (KEEP UPCOMING APPOINTMENT FOR FUTURE REFILLS) 02/24/24   Court Dorn PARAS, MD  progesterone  (PROMETRIUM) 200 MG capsule Take 200 mg by mouth at bedtime. 04/20/22   [provider]    Allergies  Allergen Reactions   Jardiance  [Empagliflozin ]     UTIs   Azithromycin Nausea And Vomiting   Penicillins Itching and Rash   Percocet [Oxycodone-Acetaminophen ]  Rash    REVIEW OF SYSTEMS:  General: no fevers/chills/night sweats Eyes: no blurry vision, diplopia, or amaurosis ENT: no sore throat or hearing loss Resp: no cough, wheezing, or hemoptysis CV: no edema or palpitations GI: no abdominal pain, nausea, vomiting, diarrhea, or constipation GU: no dysuria, frequency, or hematuria Skin: no rash Neuro: no headache, numbness, tingling, or weakness of extremities Musculoskeletal: no joint pain or swelling Heme: no bleeding, DVT, or easy bruising Endo: no polydipsia or polyuria  BP (!) 164/60   Pulse 67   Ht 5' 1.5 (1.562 m)   Wt 85 lb 3.2 oz (38.6 kg)   SpO2 96%   BMI 15.84 kg/m   PHYSICAL EXAM: GEN:  AO x 3 in no acute distress HEENT: normal Dentition: Dentures Neck: JVP normal. +2 carotid upstrokes without bruits. No thyromegaly. Lungs: equal expansion, clear bilaterally CV: Apex is discrete and nondisplaced, RRR with 2 out of 6 systolic murmur Abd: soft, non-tender, non-distended; no bruit; positive bowel sounds Ext: No obvious skin breakdown of the bilateral lower extremities.  No nonhealing ulcers Vascular: 2+ right femoral pulse, absent left femoral pulse, +1 right radial pulse, +2 left radial pulse    Skin: warm and dry without rash Neuro: CN II-XII grossly intact; motor and sensory grossly intact    DATA AND STUDIES:  EKG: Sinus rhythm with left posterior fascicular block  EKG Interpretation Date/Time:    Ventricular Rate:    PR Interval:    QRS Duration:    QT Interval:    QTC Calculation:   R Axis:      Text Interpretation:          CARDIAC STUDIES: Refer to CV Procedures and Imaging Tabs  07/31/2023: B Natriuretic Peptide  375.9 10/24/2023: ALT 20 10/28/2023: TSH 0.450 10/29/2023: BUN 19; Creatinine, Ser 0.95; Hemoglobin 10.2; Magnesium  2.1; Platelets 236; Potassium 3.9; Sodium 136   STS RISK CALCULATOR: Pending  NHYA CLASS: 2    ASSESSMENT AND PLAN:   1. Nonrheumatic aortic valve stenosis   2. NICM (nonischemic cardiomyopathy) (HCC)   3. Chronic diastolic heart failure (HCC)   4. Coronary artery disease involving native coronary artery of native heart without angina pectoris   5. Hyperlipidemia LDL goal <70   6. Aortic atherosclerosis (HCC)   7. Mesenteric ischemia (HCC)   8. Peripheral vascular disease, unspecified (HCC)   9. Pre-procedure lab exam     Aortic stenosis: The patient has developed paradoxical low-flow low gradient aortic stenosis with NYHA class II symptoms.  I reviewed her echocardiogram which demonstrates a highly calcified valve with restricted motion.  I refer the patient for coronary angiography, CTA, and cardiothoracic surgical opinion.  An issue that we need to keep in mind is the fact that the patient may require surgical revascularization for her left lower extremity.  The patient is to see Dr. Pearline on October 10.  If she were to require surgical revascularization then I think her aortic stenosis would need to be managed prior to this.   Nonischemic cardiomyopathy: Ejection fraction has normalized with medical therapy.  Continue Coreg  3.125 mg twice daily, will stop Jardiance  due to recurrent UTIs  Chronic diastolic heart failure: Continue Lasix  20 mg daily, Coreg  3.125 mg twice daily, stop Jardiance  as above Coronary artery disease: Continue aspirin  81 mg, atorvastatin  80 mg. Hyperlipidemia: Continue atorvastatin  80 mg, LDL in November 2024 was 51 Aortic atherosclerosis: Continue aspirin  81 mg, atorvastatin  80 mg Mesenteric ischemia: Status post stenting of SMA in December 2024.  Continue aspirin  81 and plavix  75.  Follows with Dr. Pearline. Peripheral vascular disease: Has  occlusion of left common femoral artery.  On my examination the patient has an absent left femoral pulse.  She has a +2 right femoral pulse.  If TAVR is pursued then we will need to use a radial artery for secondary access.  Patient to see Dr. Pearline next month.  I have personally reviewed the patients imaging data as summarized above.  I have reviewed the natural history of aortic stenosis with the patient and family members who are present today. We have discussed the limitations of medical therapy and the poor prognosis associated with symptomatic aortic stenosis. We have also reviewed potential treatment options, including palliative medical therapy, conventional surgical aortic valve replacement, and transcatheter aortic valve replacement. We discussed treatment options in the context of this patient's specific comorbid medical conditions.   All of the patient's questions were answered today. Will make further recommendations based on the results of studies outlined above.   I spent 45 minutes reviewing all clinical data during and prior to this visit including all relevant imaging studies, laboratories, clinical information from other health systems and prior notes from both Cardiology and other specialties, interviewing the patient, conducting a complete physical examination, and coordinating care in order to formulate a comprehensive and personalized evaluation and treatment plan.   Darwyn Ponzo K Rickardo Brinegar, MD  04/05/2024 9:59 AM    [facility] [address] [place], [place], [place]  [address] Phone: [phone]; Fax: [phone]

## 2024-03-30 NOTE — Progress Notes (Addendum)
 Patient ID: Christie Moreno MRN: [id number] DOB/AGE: [birth year] 68 y.o.  Primary Care Physician:Clapp, Saddie FAL[place], [place] Primary Cardiologist: Court Referring provider: Devora  CC:  Aortic valvular disease management     FOCUSED PROBLEM LIST:   Aortic stenosis AVA 0.83, MG 18, SVI 37, EF 55 to 60% TTE August 2025 EKG sinus rhythm with left posterior fascicular block NICM, now resolved EF 40 to 45% TTE 2023 >> medical therapy EF 55 to 60% TTE August 2025 Diastolic dysfunction G1 DD, PLFLGAS, EF 55 to 60% TTE August 2025 CAD D1 70 to 80%, LCx/OM 2 bifurcation disease, distal RCA disease coronary angiography 2023 Hyperlipidemia Aortic atherosclerosis CT abdomen pelvis 2024 Peripheral vascular disease followed by Dr. Pearline Mesenteric ischemia >> SMA balloon angioplasty and stenting December 2024 Rest pain left lower extremity with occlusion of left common and external iliac arteries Hypothyroidism Tobacco abuse BMI 15/BSA 1.04 April 2024:  Patient consents to use of AI scribe. The patient is a 68 year old female with above listed medical problems referred for recommendations regarding her aortic valvular disease.  She has a history of a mild nonischemic cardiomyopathy with ejection fraction of 40 to 45% in 2023.  Coronary angiography demonstrated moderate disease of multiple vessels.  She was started on medical therapy with improvement in her ejection fraction.  Her last echocardiogram demonstrated paradoxical low-flow low gradient aortic stenosis.  At her last appointment with cardiology in August she reported increasing shortness of breath with exertion.  She experiences occasional shortness of breath, particularly with rapid movement, but not consistently with walking. She was able to walk from the elevator to the office without difficulty. She also experiences occasional wheezing and uses Breztri  and a nebulizer, though she has not used the nebulizer recently. No current chest  pain, lightheadedness, or syncope, but she had a seizure years ago. She reports occasional chest discomfort described as 'pressure' occurring twice while walking around the house.  She experiences swelling in her left leg, which is not influenced by activity level. The leg has a discoloration but is not painful, and there are no poorly healing sores. She has been wearing compression socks, but they caused bleeding in her toes, possibly due to incorrect sizing. She has a plantar wart on her right heel, which she is treating herself.  She has a history of smoking and is trying to quit, currently smoking about one cigarette a day, using it as a stress reliever.  She has both upper and lower dentures.  No dental complaints.        Past Medical History:  Diagnosis Date   Complex partial seizure (HCC) 04/22/2015   Convulsions (HCC) 04/19/2013   Heart failure (HCC)    Hyperlipidemia    Hypertension    Seizures (HCC)    none in years, recently had medication change   Thyroid disease     Past Surgical History:  Procedure Laterality Date   COLONOSCOPY     PERIPHERAL INTRAVASCULAR LITHOTRIPSY  07/25/2023   Procedure: PERIPHERAL INTRAVASCULAR LITHOTRIPSY;  Surgeon: Pearline Norman RAMAN, MD;  Location: [facility] INVASIVE CV LAB;  Service: Cardiovascular;;   PERIPHERAL VASCULAR INTERVENTION  07/25/2023   Procedure: PERIPHERAL VASCULAR INTERVENTION;  Surgeon: Pearline Norman RAMAN, MD;  Location[facility] INVASIVE CV LAB;  Service: Cardiovascular;;   RIGHT/LEFT HEART CATH AND CORONARY ANGIOGRAPHY N/A 11/05/2021   Procedure: RIGHT/LEFT HEART CATH AND CORONARY ANGIOGRAPHY;  Surgeon: Claudene Victory ORN, MD;  Location: [facility] INVASIVE CV LAB;  Service: Cardiovascular;  Laterality: N/A;   VISCERAL  ANGIOGRAPHY N/A 07/25/2023   Procedure: VISCERAL ANGIOGRAPHY;  Surgeon: Pearline Norman RAMAN, MD;  Location: [facility] INVASIVE CV LAB;  Service: Cardiovascular;  Laterality: N/A;    Family History  Adopted: Yes  Problem Relation Age of Onset    Breast cancer Mother    Diabetes Mother    Stroke Mother    Heart Problems Father    Diabetes Sister    Kidney failure Sister    Breast cancer Maternal Aunt    Lung cancer Half-Sister    Colon cancer Neg Hx    Esophageal cancer Neg Hx    Epilepsy Neg Hx    Stomach cancer Neg Hx    Rectal cancer Neg Hx     Social History   Socioeconomic History   Marital status: Significant Other    Spouse name: Gaither Salt   Number of children: 1   Years of education: Not on file   Highest education level: Bachelor's degree (e.g., BA, AB, BS)  Occupational History   Occupation: retired  Tobacco Use   Smoking status: Some Days    Current packs/day: 0.00    Average packs/day: 1 pack/day for 52.0 years (52.0 ttl pk-yrs)    Types: Cigarettes    Start date: 11/09/1969    Last attempt to quit: 11/09/2021    Years since quitting: 2.4    Passive exposure: Current   Smokeless tobacco: Never   Tobacco comments:    08/31/2023 Patient smoke about 3 cigarettes daily    Currently 1 pack every 2-3 days    04/06/2022 patient states when she smokes its 1 cigarette    04/05/2023 Patient smokes 2 cigarettes some days   Vaping Use   Vaping status: Never Used  Substance and Sexual Activity   Alcohol use: Not Currently    Comment: socially   Drug use: No   Sexual activity: Not on file  Other Topics Concern   Not on file  Social History Narrative   Not on file   Social Drivers of Health   Financial Resource Strain: Low Risk  (03/22/2024)   Overall Financial Resource Strain (CARDIA)    Difficulty of Paying Living Expenses: Not hard at all  Food Insecurity: No Food Insecurity (03/22/2024)   Hunger Vital Sign    Worried About Running Out of Food in the Last Year: Never true    Ran Out of Food in the Last Year: Never true  Transportation Needs: No Transportation Needs (03/22/2024)   PRAPARE - Administrator, Civil Service (Medical): No    Lack of Transportation (Non-Medical): No  Physical  Activity: Inactive (03/22/2024)   Exercise Vital Sign    Days of Exercise per Week: 0 days    Minutes of Exercise per Session: 0 min  Stress: Stress Concern Present (03/22/2024)   [name] - Occupational Stress Questionnaire    Feeling of Stress: Rather much  Social Connections: Moderately Isolated (03/22/2024)   Social Connection and Isolation Panel    Frequency of Communication with Friends and Family: More than three times a week    Frequency of Social Gatherings with Friends and Family: Once a week    Attends Religious Services: Never    Database administrator or Organizations: No    Attends Banker Meetings: Never    Marital Status: Living with partner  Intimate Partner Violence: Not At Risk (03/22/2024)   Humiliation, Afraid, Rape, and Kick questionnaire    Fear of Current or Ex-Partner:  No    Emotionally Abused: No    Physically Abused: No    Sexually Abused: No     Prior to Admission medications   Medication Sig Start Date End Date Taking? Authorizing Provider  albuterol  (PROVENTIL ) (2.5 MG/3ML) 0.083% nebulizer solution Take 3 mLs (2.5 mg total) by nebulization every 6 (six) hours as needed for wheezing or shortness of breath. 11/09/23   Chandra Toribio POUR, MD  albuterol  (VENTOLIN  HFA) [phone] Base) MCG/ACT inhaler Inhale 2 puffs into the lungs every 4 (four) hours as needed for wheezing or shortness of breath. 12/20/23   Chandra Toribio POUR, MD  aspirin  81 MG EC tablet Take 1 tablet (81 mg total) by mouth daily. Swallow whole. 11/06/21   Arrien, Elidia Toribio, MD  atorvastatin  (LIPITOR ) 80 MG tablet Take 1 tablet (80 mg total) by mouth daily at 6 PM. 03/05/24   Court Dorn PARAS, MD  budeson-glycopyrrolate-formoterol (BREZTRI  AEROSPHERE) 160-9-4.8 MCG/ACT AERO inhaler Inhale 2 puffs into the lungs 2 (two) times daily. 11/09/23   Chandra Toribio POUR, MD  Calcium  Carbonate-Vit D-Min (CALCIUM  600+D PLUS MINERALS) 600-400 MG-UNIT CHEW 1 tablet with food  Orally Once a day; Duration: 30 day(s) Patient not taking: Reported on 03/29/2024 01/10/12   [provider]  carvedilol  (COREG ) 3.125 MG tablet TAKE 1 TABLET(3.125 MG) BY MOUTH TWICE DAILY WITH A MEAL 12/01/23   Court Dorn PARAS, MD  clopidogrel  (PLAVIX ) 75 MG tablet Take 1 tablet (75 mg total) by mouth daily. 02/08/24   Chandra Toribio POUR, MD  empagliflozin  (JARDIANCE ) 10 MG TABS tablet TAKE 1 TABLET DAILY 01/03/24   Court Dorn PARAS, MD  estrogens, conjugated, (PREMARIN) 1.25 MG tablet Take 1.25 mg by mouth daily.    [provider]  feeding supplement (ENSURE ENLIVE / ENSURE PLUS) LIQD Take 237 mLs by mouth 2 (two) times daily between meals. 11/06/21   Arrien, Mauricio Daniel, MD  furosemide  (LASIX ) 40 MG tablet Take 0.5 tablets (20 mg total) by mouth daily. TAKE ONE-HALF (1/2) TABLET (20 MG TOTAL) DAILY 03/02/24   Court Dorn PARAS, MD  hydrOXYzine  (ATARAX ) 25 MG tablet Take 25 mg by mouth every 8 (eight) hours as needed. Patient not taking: Reported on 03/29/2024    [provider]  lamoTRIgine  (LAMICTAL ) 100 MG tablet Take 1 tablet (100 mg total) by mouth 2 (two) times daily. 08/22/23   Gayland Lauraine PARAS, NP  levothyroxine  (SYNTHROID ) 25 MCG tablet Take 1 tablet (25 mcg total) by mouth daily before breakfast. 06/24/23   Wallace Search A, PA  naproxen sodium (ALEVE) 220 MG tablet Take 220 mg by mouth daily as needed. Patient not taking: Reported on 03/29/2024    [provider]  nicotine  polacrilex (NICORETTE ) 2 MG gum Take 1 each (2 mg total) by mouth as needed for smoking cessation. Patient not taking: Reported on 03/29/2024 02/08/24   Chandra Toribio POUR, MD  pantoprazole  (PROTONIX ) 40 MG tablet Take 1 tablet (40 mg total) by mouth daily. Patient not taking: Reported on 03/29/2024 12/20/23   Chandra Toribio POUR, MD  potassium chloride  SA (KLOR-CON  M) 20 MEQ tablet TAKE 1 TABLET DAILY (KEEP UPCOMING APPOINTMENT FOR FUTURE REFILLS) 02/24/24   Court Dorn PARAS, MD  progesterone  (PROMETRIUM) 200 MG capsule Take 200 mg by mouth at bedtime. 04/20/22   [provider]    Allergies  Allergen Reactions   Jardiance  [Empagliflozin ]     UTIs   Azithromycin Nausea And Vomiting   Penicillins Itching and Rash   Percocet [Oxycodone-Acetaminophen ]  Rash    REVIEW OF SYSTEMS:  General: no fevers/chills/night sweats Eyes: no blurry vision, diplopia, or amaurosis ENT: no sore throat or hearing loss Resp: no cough, wheezing, or hemoptysis CV: no edema or palpitations GI: no abdominal pain, nausea, vomiting, diarrhea, or constipation GU: no dysuria, frequency, or hematuria Skin: no rash Neuro: no headache, numbness, tingling, or weakness of extremities Musculoskeletal: no joint pain or swelling Heme: no bleeding, DVT, or easy bruising Endo: no polydipsia or polyuria  BP (!) 164/60   Pulse 67   Ht 5' 1.5 (1.562 m)   Wt 85 lb 3.2 oz (38.6 kg)   SpO2 96%   BMI 15.84 kg/m   PHYSICAL EXAM: GEN:  AO x 3 in no acute distress HEENT: normal Dentition: Dentures Neck: JVP normal. +2 carotid upstrokes without bruits. No thyromegaly. Lungs: equal expansion, clear bilaterally CV: Apex is discrete and nondisplaced, RRR with 2 out of 6 systolic murmur Abd: soft, non-tender, non-distended; no bruit; positive bowel sounds Ext: No obvious skin breakdown of the bilateral lower extremities.  No nonhealing ulcers Vascular: 2+ right femoral pulse, absent left femoral pulse, +1 right radial pulse, +2 left radial pulse    Skin: warm and dry without rash Neuro: CN II-XII grossly intact; motor and sensory grossly intact    DATA AND STUDIES:  EKG: Sinus rhythm with left posterior fascicular block  EKG Interpretation Date/Time:    Ventricular Rate:    PR Interval:    QRS Duration:    QT Interval:    QTC Calculation:   R Axis:      Text Interpretation:          CARDIAC STUDIES: Refer to CV Procedures and Imaging Tabs  07/31/2023: B Natriuretic Peptide  375.9 10/24/2023: ALT 20 10/28/2023: TSH 0.450 10/29/2023: BUN 19; Creatinine, Ser 0.95; Hemoglobin 10.2; Magnesium  2.1; Platelets 236; Potassium 3.9; Sodium 136   STS RISK CALCULATOR: Pending  NHYA CLASS: 2    ASSESSMENT AND PLAN:   1. Nonrheumatic aortic valve stenosis   2. NICM (nonischemic cardiomyopathy) (HCC)   3. Chronic diastolic heart failure (HCC)   4. Coronary artery disease involving native coronary artery of native heart without angina pectoris   5. Hyperlipidemia LDL goal <70   6. Aortic atherosclerosis (HCC)   7. Mesenteric ischemia (HCC)   8. Peripheral vascular disease, unspecified (HCC)   9. Pre-procedure lab exam     Aortic stenosis: The patient has developed paradoxical low-flow low gradient aortic stenosis with NYHA class II symptoms.  I reviewed her echocardiogram which demonstrates a highly calcified valve with restricted motion.  I refer the patient for coronary angiography, CTA, and cardiothoracic surgical opinion.  An issue that we need to keep in mind is the fact that the patient may require surgical revascularization for her left lower extremity.  The patient is to see Dr. Pearline on October 10.  If she were to require surgical revascularization then I think her aortic stenosis would need to be managed prior to this.   Nonischemic cardiomyopathy: Ejection fraction has normalized with medical therapy.  Continue Coreg  3.125 mg twice daily, will stop Jardiance  due to recurrent UTIs  Chronic diastolic heart failure: Continue Lasix  20 mg daily, Coreg  3.125 mg twice daily, stop Jardiance  as above Coronary artery disease: Continue aspirin  81 mg, atorvastatin  80 mg. Hyperlipidemia: Continue atorvastatin  80 mg, LDL in November 2024 was 51 Aortic atherosclerosis: Continue aspirin  81 mg, atorvastatin  80 mg Mesenteric ischemia: Status post stenting of SMA in December 2024.  Continue aspirin  81 and plavix  75.  Follows with Dr. Pearline. Peripheral vascular disease: Has  occlusion of left common femoral artery.  On my examination the patient has an absent left femoral pulse.  She has a +2 right femoral pulse.  If TAVR is pursued then we will need to use a radial artery for secondary access.  Patient to see Dr. Pearline next month.  I have personally reviewed the patients imaging data as summarized above.  I have reviewed the natural history of aortic stenosis with the patient and family members who are present today. We have discussed the limitations of medical therapy and the poor prognosis associated with symptomatic aortic stenosis. We have also reviewed potential treatment options, including palliative medical therapy, conventional surgical aortic valve replacement, and transcatheter aortic valve replacement. We discussed treatment options in the context of this patient's specific comorbid medical conditions.   All of the patient's questions were answered today. Will make further recommendations based on the results of studies outlined above.   I spent 45 minutes reviewing all clinical data during and prior to this visit including all relevant imaging studies, laboratories, clinical information from other health systems and prior notes from both Cardiology and other specialties, interviewing the patient, conducting a complete physical examination, and coordinating care in order to formulate a comprehensive and personalized evaluation and treatment plan.   Natalina Wieting K Elfrida Pixley, MD  04/05/2024 9:59 AM    [facility] [address] [place], [place], [place]  [address] Phone: [phone]; Fax: [phone]

## 2024-04-05 ENCOUNTER — Ambulatory Visit: Attending: Internal Medicine | Admitting: Internal Medicine

## 2024-04-05 ENCOUNTER — Encounter: Payer: Self-pay | Admitting: Internal Medicine

## 2024-04-05 VITALS — BP 164/60 | HR 67 | Ht 61.5 in | Wt 85.2 lb

## 2024-04-05 DIAGNOSIS — I739 Peripheral vascular disease, unspecified: Secondary | ICD-10-CM | POA: Diagnosis present

## 2024-04-05 DIAGNOSIS — Z01812 Encounter for preprocedural laboratory examination: Secondary | ICD-10-CM | POA: Insufficient documentation

## 2024-04-05 DIAGNOSIS — I350 Nonrheumatic aortic (valve) stenosis: Secondary | ICD-10-CM | POA: Diagnosis present

## 2024-04-05 DIAGNOSIS — K559 Vascular disorder of intestine, unspecified: Secondary | ICD-10-CM | POA: Diagnosis present

## 2024-04-05 DIAGNOSIS — E785 Hyperlipidemia, unspecified: Secondary | ICD-10-CM | POA: Diagnosis present

## 2024-04-05 DIAGNOSIS — I5032 Chronic diastolic (congestive) heart failure: Secondary | ICD-10-CM | POA: Insufficient documentation

## 2024-04-05 DIAGNOSIS — I428 Other cardiomyopathies: Secondary | ICD-10-CM | POA: Diagnosis present

## 2024-04-05 DIAGNOSIS — I700 Atherosclerosis of aorta: Secondary | ICD-10-CM | POA: Diagnosis present

## 2024-04-05 DIAGNOSIS — I2510 Atherosclerotic heart disease of native coronary artery without angina pectoris: Secondary | ICD-10-CM | POA: Diagnosis present

## 2024-04-05 NOTE — Progress Notes (Signed)
 Pre Surgical Assessment: 5 M Walk Test  81M=16.87ft  5 Meter Walk Test- trial 1: 5.61 seconds 5 Meter Walk Test- trial 2: 4.87 seconds 5 Meter Walk Test- trial 3: 4.98 seconds 5 Meter Walk Test Average: 5.15 seconds

## 2024-04-05 NOTE — Patient Instructions (Signed)
 Medication Instructions:  Your physician has recommended you make the following change in your medication:   1) STOP Jardiance   *If you need a refill on your cardiac medications before your next appointment, please call your pharmacy*  Lab Work: TODAY: CBC, BMET If you have labs (blood work) drawn today and your tests are completely normal, you will receive your results only by: MyChart Message (if you have MyChart) OR A paper copy in the mail If you have any lab test that is abnormal or we need to change your treatment, we will call you to review the results.  Testing/Procedures: Your physician has requested that you have a cardiac catheterization. Cardiac catheterization is used to diagnose and/or treat various heart conditions. Doctors may recommend this procedure for a number of different reasons. The most common reason is to evaluate chest pain. Chest pain can be a symptom of coronary artery disease (CAD), and cardiac catheterization can show whether plaque is narrowing or blocking your heart's arteries. This procedure is also used to evaluate the valves, as well as measure the blood flow and oxygen levels in different parts of your heart. For further information please visit [email or web address] Please follow instruction sheet, as given.  Follow-Up: At [facility], you and your health needs are our priority.  As part of our continuing mission to provide you with exceptional heart care, our providers are all part of one team.  This team includes your primary Cardiologist (physician) and Advanced Practice Providers or APPs (Physician Assistants and Nurse Practitioners) who all work together to provide you with the care you need, when you need it.  Your next appointment:   The structural team will arrange follow-up after your procedure.  Provider:   Lurena Red, MD  We recommend signing up for the patient portal called MyChart.  Sign up information is provided on this After  Visit Summary.  MyChart is used to connect with patients for Virtual Visits (Telemedicine).  Patients are able to view lab/test results, encounter notes, upcoming appointments, etc.  Non-urgent messages can be sent to your provider as well.    To learn more about what you can do with MyChart, go to [email or web address].

## 2024-04-06 ENCOUNTER — Encounter (HOSPITAL_COMMUNITY)

## 2024-04-06 ENCOUNTER — Ambulatory Visit: Admitting: Vascular Surgery

## 2024-04-06 LAB — CBC
Hematocrit: 37.4 % (ref 34.0–46.6)
Hemoglobin: 10.5 g/dL — ABNORMAL LOW (ref 11.1–15.9)
MCH: 21.3 pg — ABNORMAL LOW (ref 26.6–33.0)
MCHC: 28.1 g/dL — ABNORMAL LOW (ref 31.5–35.7)
MCV: 76 fL — ABNORMAL LOW (ref 79–97)
Platelets: 266 x10E3/uL (ref 150–450)
RBC: 4.93 x10E6/uL (ref 3.77–5.28)
RDW: 18.7 % — ABNORMAL HIGH (ref 11.7–15.4)
WBC: 7.1 x10E3/uL (ref 3.4–10.8)

## 2024-04-06 LAB — BASIC METABOLIC PANEL WITH GFR
BUN/Creatinine Ratio: 28 (ref 12–28)
BUN: 24 mg/dL (ref 8–27)
CO2: 19 mmol/L — AB (ref 20–29)
Calcium: 8.9 mg/dL (ref 8.7–10.3)
Chloride: 103 mmol/L (ref 96–106)
Creatinine, Ser: 0.85 mg/dL (ref 0.57–1.00)
Glucose: 65 mg/dL — AB (ref 70–99)
Potassium: 4.6 mmol/L (ref 3.5–5.2)
Sodium: 139 mmol/L (ref 134–144)
eGFR: 74 mL/min/1.73 (ref >59)

## 2024-04-09 ENCOUNTER — Encounter (HOSPITAL_COMMUNITY)
Admission: RE | Disposition: A | Discharge status: Home / Self Care | Payer: Self-pay | Source: Home / Self Care | Attending: Internal Medicine

## 2024-04-09 ENCOUNTER — Ambulatory Visit (HOSPITAL_COMMUNITY)
Admission: RE | Admit: 2024-04-09 | Discharge: 2024-04-09 | Disposition: A | Discharge status: Home / Self Care | Attending: Internal Medicine | Admitting: Internal Medicine

## 2024-04-09 ENCOUNTER — Telehealth: Payer: Self-pay | Admitting: Cardiovascular Disease

## 2024-04-09 ENCOUNTER — Other Ambulatory Visit: Payer: Self-pay

## 2024-04-09 DIAGNOSIS — I745 Embolism and thrombosis of iliac artery: Secondary | ICD-10-CM | POA: Insufficient documentation

## 2024-04-09 DIAGNOSIS — I350 Nonrheumatic aortic (valve) stenosis: Secondary | ICD-10-CM | POA: Diagnosis not present

## 2024-04-09 DIAGNOSIS — I428 Other cardiomyopathies: Secondary | ICD-10-CM | POA: Insufficient documentation

## 2024-04-09 DIAGNOSIS — Z7982 Long term (current) use of aspirin: Secondary | ICD-10-CM | POA: Insufficient documentation

## 2024-04-09 DIAGNOSIS — E785 Hyperlipidemia, unspecified: Secondary | ICD-10-CM | POA: Insufficient documentation

## 2024-04-09 DIAGNOSIS — I70202 Unspecified atherosclerosis of native arteries of extremities, left leg: Secondary | ICD-10-CM | POA: Insufficient documentation

## 2024-04-09 DIAGNOSIS — Z87440 Personal history of urinary (tract) infections: Secondary | ICD-10-CM | POA: Diagnosis not present

## 2024-04-09 DIAGNOSIS — Z79899 Other long term (current) drug therapy: Secondary | ICD-10-CM | POA: Insufficient documentation

## 2024-04-09 DIAGNOSIS — I5032 Chronic diastolic (congestive) heart failure: Secondary | ICD-10-CM | POA: Insufficient documentation

## 2024-04-09 DIAGNOSIS — I110 Hypertensive heart disease with heart failure: Secondary | ICD-10-CM | POA: Insufficient documentation

## 2024-04-09 DIAGNOSIS — F17210 Nicotine dependence, cigarettes, uncomplicated: Secondary | ICD-10-CM | POA: Insufficient documentation

## 2024-04-09 DIAGNOSIS — Z955 Presence of coronary angioplasty implant and graft: Secondary | ICD-10-CM | POA: Insufficient documentation

## 2024-04-09 DIAGNOSIS — I2510 Atherosclerotic heart disease of native coronary artery without angina pectoris: Secondary | ICD-10-CM

## 2024-04-09 DIAGNOSIS — K559 Vascular disorder of intestine, unspecified: Secondary | ICD-10-CM | POA: Diagnosis not present

## 2024-04-09 DIAGNOSIS — Z7902 Long term (current) use of antithrombotics/antiplatelets: Secondary | ICD-10-CM | POA: Insufficient documentation

## 2024-04-09 HISTORY — PX: RIGHT HEART CATH AND CORONARY ANGIOGRAPHY: CATH118264

## 2024-04-09 LAB — POCT I-STAT EG7
Acid-base deficit: 1 mmol/L (ref 0.0–2.0)
Acid-base deficit: 2 mmol/L (ref 0.0–2.0)
Bicarbonate: 23.7 mmol/L (ref 20.0–28.0)
Bicarbonate: 24.7 mmol/L (ref 20.0–28.0)
Calcium, Ion: 1.06 mmol/L — ABNORMAL LOW (ref 1.15–1.40)
Calcium, Ion: 1.18 mmol/L (ref 1.15–1.40)
HCT: 29 % — ABNORMAL LOW (ref 36.0–46.0)
HCT: 31 % — ABNORMAL LOW (ref 36.0–46.0)
Hemoglobin: 10.5 g/dL — ABNORMAL LOW (ref 12.0–15.0)
Hemoglobin: 9.9 g/dL — ABNORMAL LOW (ref 12.0–15.0)
O2 Saturation: 68 %
O2 Saturation: 69 %
Potassium: 3.3 mmol/L — ABNORMAL LOW (ref 3.5–5.1)
Potassium: 3.7 mmol/L (ref 3.5–5.1)
Sodium: 138 mmol/L (ref 135–145)
Sodium: 140 mmol/L (ref 135–145)
TCO2: 25 mmol/L (ref 22–32)
TCO2: 26 mmol/L (ref 22–32)
pCO2, Ven: 43.9 mmHg — ABNORMAL LOW (ref 44–60)
pCO2, Ven: 46.4 mmHg (ref 44–60)
pH, Ven: 7.334 (ref 7.25–7.43)
pH, Ven: 7.34 (ref 7.25–7.43)
pO2, Ven: 38 mmHg (ref 32–45)
pO2, Ven: 39 mmHg (ref 32–45)

## 2024-04-09 LAB — POCT I-STAT 7, (LYTES, BLD GAS, ICA,H+H)
Acid-base deficit: 1 mmol/L (ref 0.0–2.0)
Bicarbonate: 24.3 mmol/L (ref 20.0–28.0)
Calcium, Ion: 1.18 mmol/L (ref 1.15–1.40)
HCT: 31 % — ABNORMAL LOW (ref 36.0–46.0)
Hemoglobin: 10.5 g/dL — ABNORMAL LOW (ref 12.0–15.0)
O2 Saturation: 95 %
Potassium: 3.8 mmol/L (ref 3.5–5.1)
Sodium: 139 mmol/L (ref 135–145)
TCO2: 26 mmol/L (ref 22–32)
pCO2 arterial: 40.8 mmHg (ref 32–48)
pH, Arterial: 7.383 (ref 7.35–7.45)
pO2, Arterial: 75 mmHg — ABNORMAL LOW (ref 83–108)

## 2024-04-09 SURGERY — RIGHT HEART CATH AND CORONARY ANGIOGRAPHY
Anesthesia: LOCAL

## 2024-04-09 MED ORDER — SODIUM CHLORIDE 0.9 % IV SOLN: 250.0000 mL | INTRAVENOUS | Status: DC | PRN

## 2024-04-09 MED ORDER — FREE WATER
500.0000 mL | Freq: Once | Status: AC
  Administered 2024-04-09: 500 mL via ORAL

## 2024-04-09 MED ORDER — ASPIRIN 81 MG PO CHEW: 81.0000 mg | CHEWABLE_TABLET | ORAL | Status: DC

## 2024-04-09 MED ORDER — ONDANSETRON HCL 4 MG/2ML IJ SOLN
4.0000 mg | Freq: Four times a day (QID) | INTRAMUSCULAR | Status: DC | PRN
Start: 2024-04-09 — End: 2024-04-09

## 2024-04-09 MED ORDER — HYDRALAZINE HCL 20 MG/ML IJ SOLN: 10.0000 mg | INTRAMUSCULAR | Status: DC | PRN

## 2024-04-09 MED ORDER — LABETALOL HCL 5 MG/ML IV SOLN: 10.0000 mg | INTRAVENOUS | Status: DC | PRN

## 2024-04-09 MED ORDER — SODIUM CHLORIDE 0.9% FLUSH: 3.0000 mL | Freq: Two times a day (BID) | INTRAVENOUS | Status: DC

## 2024-04-09 MED ORDER — FENTANYL CITRATE (PF) 100 MCG/2ML IJ SOLN
INTRAMUSCULAR | Status: DC | PRN
  Administered 2024-04-09: 25 ug via INTRAVENOUS

## 2024-04-09 MED ORDER — LIDOCAINE HCL (PF) 1 % IJ SOLN
INTRAMUSCULAR | Status: AC
  Filled 2024-04-09: qty 30

## 2024-04-09 MED ORDER — HEPARIN (PORCINE) IN NACL 1000-0.9 UT/500ML-% IV SOLN
INTRAVENOUS | Status: DC | PRN
Start: 2024-04-09 — End: 2024-04-09
  Administered 2024-04-09: 1000 mL via SURGICAL_CAVITY

## 2024-04-09 MED ORDER — FENTANYL CITRATE (PF) 100 MCG/2ML IJ SOLN
INTRAMUSCULAR | Status: AC
  Filled 2024-04-09: qty 2

## 2024-04-09 MED ORDER — SODIUM CHLORIDE 0.9% FLUSH: 3.0000 mL | INTRAVENOUS | Status: DC | PRN

## 2024-04-09 MED ORDER — HEPARIN SODIUM (PORCINE) 1000 UNIT/ML IJ SOLN
INTRAMUSCULAR | Status: DC | PRN
Start: 2024-04-09 — End: 2024-04-09
  Administered 2024-04-09: 3000 [IU] via INTRAVENOUS

## 2024-04-09 MED ORDER — MIDAZOLAM HCL 2 MG/2ML IJ SOLN
INTRAMUSCULAR | Status: DC | PRN
  Administered 2024-04-09: 1 mg via INTRAVENOUS

## 2024-04-09 MED ORDER — FREE WATER: 500.0000 mL | Freq: Once | Status: DC

## 2024-04-09 MED ORDER — VERAPAMIL HCL 2.5 MG/ML IV SOLN
INTRAVENOUS | Status: DC | PRN
  Administered 2024-04-09: 10 mL via INTRA_ARTERIAL

## 2024-04-09 MED ORDER — CARVEDILOL 3.125 MG PO TABS: 3.1250 mg | ORAL_TABLET | Freq: Once | ORAL | Status: DC

## 2024-04-09 MED ORDER — IOHEXOL 350 MG/ML SOLN
INTRAVENOUS | Status: DC | PRN
Start: 2024-04-09 — End: 2024-04-09
  Administered 2024-04-09: 55 mL via INTRA_ARTERIAL

## 2024-04-09 MED ORDER — ACETAMINOPHEN 325 MG PO TABS
650.0000 mg | ORAL_TABLET | Freq: Once | ORAL | Status: AC
  Administered 2024-04-09: 325 mg via ORAL
  Filled 2024-04-09: qty 2

## 2024-04-09 MED ORDER — HEPARIN SODIUM (PORCINE) 1000 UNIT/ML IJ SOLN
INTRAMUSCULAR | Status: AC
  Filled 2024-04-09: qty 10

## 2024-04-09 MED ORDER — VERAPAMIL HCL 2.5 MG/ML IV SOLN
INTRAVENOUS | Status: AC
  Filled 2024-04-09: qty 2

## 2024-04-09 MED ORDER — MIDAZOLAM HCL 2 MG/2ML IJ SOLN
INTRAMUSCULAR | Status: AC
  Filled 2024-04-09: qty 2

## 2024-04-09 MED ORDER — LIDOCAINE HCL (PF) 1 % IJ SOLN
INTRAMUSCULAR | Status: DC | PRN
  Administered 2024-04-09 (×2): 2 mL

## 2024-04-09 SURGICAL SUPPLY — 12 items
CATH BALLN WEDGE 5F 110CM (CATHETERS) IMPLANT
CATH INFINITI 5FR ANG PIGTAIL (CATHETERS) IMPLANT
CATH INFINITI AMBI 6FR TG (CATHETERS) IMPLANT
DEVICE RAD COMP TR BAND LRG (VASCULAR PRODUCTS) IMPLANT
DEVICE RAD TR BAND REGULAR (VASCULAR PRODUCTS) IMPLANT
GLIDESHEATH SLEND SS 6F .021 (SHEATH) IMPLANT
GUIDEWIRE .025 260CM (WIRE) IMPLANT
PACK CARDIAC CATHETERIZATION (CUSTOM PROCEDURE TRAY) ×1 IMPLANT
SET ATX-X65L (MISCELLANEOUS) IMPLANT
SHEATH GLIDE SLENDER 4/5FR (SHEATH) IMPLANT
WIRE EMERALD 3MM-J .025X260CM (WIRE) IMPLANT
WIRE EMERALD 3MM-J .035X260CM (WIRE) IMPLANT

## 2024-04-09 NOTE — Interval H&P Note (Signed)
 History and Physical Interval Note:  04/09/2024 7:16 AM  Christie Moreno  has presented today for surgery, with the diagnosis of aortic stenosis.  The various methods of treatment have been discussed with the patient and family. After consideration of risks, benefits and other options for treatment, the patient has consented to  Procedure(s): RIGHT/LEFT HEART CATH AND CORONARY ANGIOGRAPHY (N/A) as a surgical intervention.  The patient's history has been reviewed, patient examined, no change in status, stable for surgery.  I have reviewed the patient's chart and labs.  Questions were answered to the patient's satisfaction.     Kentley Blyden K Nolon Yellin

## 2024-04-09 NOTE — Progress Notes (Signed)
 Manuelita Rummer, NP in to see client; right radial site soft, bruised

## 2024-04-09 NOTE — Progress Notes (Signed)
 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

## 2024-04-09 NOTE — Progress Notes (Signed)
 Morna Rummer, NP in to see client and ok to d/c home; right hand and forearm bruised and soft

## 2024-04-09 NOTE — Telephone Encounter (Signed)
 Patient was returning call for results. Please advise

## 2024-04-09 NOTE — Progress Notes (Signed)
 Pressure on B/P cuff right radial after reduced every 

## 2024-04-09 NOTE — Progress Notes (Signed)
 Distal right forearm hematoma has improved. Small 2x 2 hematoma noted proximal to wright near elbow. BP cuff protocol reinitiated.Christie Moreno

## 2024-04-09 NOTE — Progress Notes (Signed)
 BP cuff removed from right arm. Hematoma improved. No increased bleeding or swelling noted. Post activity and precautions explained. Arm remains elevated on pillow. Will continue to monitor per orders/protocol.Marquize Seib E

## 2024-04-09 NOTE — Telephone Encounter (Signed)
 Christie D West, NP 04/08/2024 12:41 PM EDT     Please let Christie Moreno know that her CBC shows no evidence of infection, her hemoglobin and hematocrit are stable. Her BMET shows normal kidney function and electrolytes, her blood sugar was slightly low, please remind her to eat frequent meals. Overall good results, continue current medications.     The patient has been notified of the result and verbalized understanding.  All questions (if any) were answered. [name], RN 04/09/2024 4:47 PM

## 2024-04-09 NOTE — Progress Notes (Signed)
 Dr Wendel notified of reverse barbeau D on arrival from cath lab and that right hand cyanotic and bruised

## 2024-04-09 NOTE — Progress Notes (Signed)
 Dr Wendel in; client c/o 7/10 pain right radial site and states feels like swelling increasing; per Dr Wendel blood pressure cuff placed on right forearm and inflated to lower than systolic B/P

## 2024-04-10 ENCOUNTER — Other Ambulatory Visit: Payer: Self-pay

## 2024-04-10 ENCOUNTER — Encounter (HOSPITAL_COMMUNITY): Payer: Self-pay | Admitting: Internal Medicine

## 2024-04-10 DIAGNOSIS — I350 Nonrheumatic aortic (valve) stenosis: Secondary | ICD-10-CM

## 2024-04-11 ENCOUNTER — Telehealth: Payer: Self-pay | Admitting: Internal Medicine

## 2024-04-11 NOTE — Telephone Encounter (Signed)
 Per Dr. Wendel, would monitor, will likely get better over the next few days. If not let us  know.   Informed patient of Dr.Thukkani's advisement and she agreed to plan.

## 2024-04-11 NOTE — Telephone Encounter (Signed)
 Called patient back about message. Patient stated Christie Moreno has two sites on her forearm that are swollen, one is in her [facility] area. Patient stated it's like a silver dollar size spot that is bruised that is not hot, red or pitting. Patient stated it is just sagging with fluid maybe. Will send message to Dr. Wendel for advisement.

## 2024-04-11 NOTE — Telephone Encounter (Signed)
 Patient wants a call back to discuss an issue at her incision site and her right arm has been swelling. Patient wants advice on how to properly care for incision site.

## 2024-04-16 ENCOUNTER — Other Ambulatory Visit: Payer: Self-pay

## 2024-04-17 ENCOUNTER — Telehealth: Payer: Self-pay | Admitting: Cardiovascular Disease

## 2024-04-17 NOTE — Telephone Encounter (Signed)
 Called patient and  advise her of Dr. Court recommendations ro schedule an appt with provider. Appt made with DOD 9/16. Per patient bleeding at cath site is better and only spot here and there. Per patient the knot about the size.  Denies any pain at site and patient verbalized she feels fine. Advise patient if symptoms worsen before appointment to go to ED. Understanding verbalized.

## 2024-04-17 NOTE — Telephone Encounter (Signed)
 Pt had cath on 04/09/24 and is having right arm pain at incision site , swollen at wrist, black and blue, knots at the wrist and elbow at incision site. Blood at incision site at elbow.

## 2024-04-17 NOTE — Telephone Encounter (Signed)
 Spoke with pt regarding her cath site. Pt had cath on 04/09/24. Pt stated her right arm is black and blue and her wrist is swollen. Pt stated she also has a small knot the size of a thumb print just above the cath site at her wrist. Pt stated overall it is getting better but wanted to make sure everything was ok. Pt stated the cath site is not bleeding but that the site where the IV was is bleeding a tiny bit. Pt was told her concerns would be sent to Dr. Wendel and Dr. Court for their suggestions. Pt verbalized understanding. All questions if any were answered.

## 2024-04-18 ENCOUNTER — Encounter: Payer: Self-pay | Admitting: Cardiology

## 2024-04-18 ENCOUNTER — Ambulatory Visit: Attending: Cardiology | Admitting: Cardiology

## 2024-04-18 VITALS — BP 128/84 | HR 72 | Resp 16 | Ht 61.0 in | Wt 86.4 lb

## 2024-04-18 DIAGNOSIS — I2510 Atherosclerotic heart disease of native coronary artery without angina pectoris: Secondary | ICD-10-CM | POA: Insufficient documentation

## 2024-04-18 DIAGNOSIS — I350 Nonrheumatic aortic (valve) stenosis: Secondary | ICD-10-CM | POA: Insufficient documentation

## 2024-04-18 DIAGNOSIS — I700 Atherosclerosis of aorta: Secondary | ICD-10-CM | POA: Insufficient documentation

## 2024-04-18 DIAGNOSIS — I739 Peripheral vascular disease, unspecified: Secondary | ICD-10-CM | POA: Diagnosis present

## 2024-04-18 DIAGNOSIS — Z9889 Other specified postprocedural states: Secondary | ICD-10-CM | POA: Insufficient documentation

## 2024-04-18 DIAGNOSIS — E785 Hyperlipidemia, unspecified: Secondary | ICD-10-CM | POA: Diagnosis present

## 2024-04-18 NOTE — Patient Instructions (Signed)
 Medication Instructions:  Your physician recommends that you continue on your current medications as directed. Please refer to the Current Medication list given to you today.  *If you need a refill on your cardiac medications before your next appointment, please call your pharmacy*  Lab Work: NONE  If you have labs (blood work) drawn today and your tests are completely normal, you will receive your results only by: MyChart Message (if you have MyChart) OR A paper copy in the mail If you have any lab test that is abnormal or we need to change your treatment, we will call you to review the results.  Testing/Procedures: NONE  Follow-Up: At [facility], you and your health needs are our priority.  As part of our continuing mission to provide you with exceptional heart care, our providers are all part of one team.  This team includes your primary Cardiologist (physician) and Advanced Practice Providers or APPs (Physician Assistants and Nurse Practitioners) who all work together to provide you with the care you need, when you need it.  Your next appointment:   1 month(s)  Provider:   Dorn Lesches, MD    We recommend signing up for the patient portal called MyChart.  Sign up information is provided on this After Visit Summary.  MyChart is used to connect with patients for Virtual Visits (Telemedicine).  Patients are able to view lab/test results, encounter notes, upcoming appointments, etc.  Non-urgent messages can be sent to your provider as well.   To learn more about what you can do with MyChart, go to [email or web address].   Other Instructions If swelling decreases you can cancel follow up appointment with Dr. Lesches

## 2024-04-18 NOTE — Progress Notes (Signed)
 Cardiology Office Note:  .   Date:  04/18/2024  ID:  Christie Moreno, DOB 1955-08-04, MRN [id number] PCP:  Christie Moreno  [facility] Providers Cardiologist:  Dorn Lesches, MD  Electrophysiologist:  None  Click to update primary MD,subspecialty MD or APP then REFRESH:1}    Chief Complaint  Patient presents with   Follow-up    Post heart catheterization, patient concerns    History of Present Illness: .   Christie Moreno is a 68 y.o.  female whose past medical history and cardiovascular risk factors includes: CAD, recovered nonischemic cardiomyopathy, aortic stenosis, hyperlipidemia, aortic atherosclerosis, peripheral vascular disease followed by vascular surgery, tobacco abuse, hypothyroidism.  Patient follows with Dr. Lesches on a longitudinal basis and I am seeing her today as a DOD visit for post heart catheterization and patient concerns for bleeding and knots.  Patient was seen by Dr. Wendel on 04/05/2024 for aortic valve stenosis.  It was felt that she has low-flow low gradient aortic stenosis with NYHA class II symptoms.  Patient was being worked up for aortic valve replacement.  She had a RHC and coronary angiography on 04/09/2024 and has been calling the office for concerns for bleeding and ecchymosis.  Patient states that post heart catheterization she has been using her right arm on a daily basis.  Based on my conversations she may be lifting objects closer to 10 pounds. She has noted bruising dorsal and ventral aspect of the right arm. Initially she felt a  super knot that is moved. She is able to make a fist and no change in range of motion.    Review of Systems: .   Review of Systems  Cardiovascular:  Negative for chest pain, claudication, irregular heartbeat, leg swelling, near-syncope, orthopnea, palpitations, paroxysmal nocturnal dyspnea and syncope.  Respiratory:  Negative for shortness of breath.   Hematologic/Lymphatic: Negative for bleeding  problem.  Skin:        Ecchymosis, right upper extremity    Studies Reviewed:    Right heart catheterization and coronary angiogram 04/09/2024   Mid Cx to Dist Cx lesion is 75% stenosed.   Mid Cx lesion is 30% stenosed.   1st Diag lesion is 40% stenosed.   2nd Mrg lesion is 40% stenosed.   RPDA lesion is 80% stenosed.   1.  Mild to moderate nonobstructive coronary artery disease relatively unchanged versus previous study from 2023. 2.  Fick cardiac output of 4.7 L/min and Fick cardiac index of 3.6 L/min/m with the following hemodynamics:            Right atrial pressure mean of 6 mmHg            Right ventricular pressure 35/1 with an end-diastolic pressure of 9 mmHg            Wedge pressure mean of 17 mmHg with V waves to 21 mmHg            PA pressure of 33/16 with a mean of 23 mmHg            PVR of 1.3 Woods units            PA pulsatility index of 2.8 3.  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  If a TAVR is to be pursued, the right femoral artery (with radial secondary access site) appears feasible.   Recommendation: Continue evaluation for potential aortic valve intervention.  Labs:  Latest Ref Rng & Units 04/09/2024    9:10 AM 04/09/2024    9:01 AM 04/09/2024    8:59 AM  CBC  Hemoglobin 12.0 - 15.0 g/dL 9.9  89.4  89.4   Hematocrit 36.0 - 46.0 % 29.0  31.0  31.0        Latest Ref Rng & Units 04/09/2024    9:10 AM 04/09/2024    9:01 AM 04/09/2024    8:59 AM  BMP  Sodium 135 - 145 mmol/L 140  138  139   Potassium 3.5 - 5.1 mmol/L 3.3  3.7  3.8       Latest Ref Rng & Units 04/09/2024    9:10 AM 04/09/2024    9:01 AM 04/09/2024    8:59 AM  CMP  Sodium 135 - 145 mmol/L 140  138  139   Potassium 3.5 - 5.1 mmol/L 3.3  3.7  3.8     Lab Results  Component Value Date   CHOL 150 06/10/2023   HDL 84 06/10/2023   LDLCALC 51 06/10/2023   TRIG 77 06/10/2023   CHOLHDL 1.8 06/10/2023   No results  for input(s): LIPOA in the last 8760 hours. No components found for: NTPROBNP No results for input(s): PROBNP in the last 8760 hours. Recent Labs    06/10/23 0926 10/28/23 2146  TSH 2.790 0.450    Physical Exam:    Today's Vitals   04/18/24 1606  BP: 128/84  Pulse: 72  Resp: 16  SpO2: 97%  Weight: 86 lb 6.4 oz (39.2 kg)  Height: 5' 1 (1.549 m)   Body mass index is 16.33 kg/m. Wt Readings from Last 3 Encounters:  04/18/24 86 lb 6.4 oz (39.2 kg)  04/09/24 83 lb 9.6 oz (37.9 kg)  04/05/24 85 lb 3.2 oz (38.6 kg)    Physical Exam  Constitutional: No distress.  hemodynamically stable  Neck: No JVD present.  Cardiovascular: Normal rate, regular rhythm, S1 normal and S2 normal. Exam reveals no gallop, no S3 and no S4.  Murmur heard. Systolic murmur is present with a grade of 3/6 at the upper right sternal border. Right radial site is healing well, no hematoma, no bruit auscultated.  Ecchymoses present, see picture  Pulmonary/Chest: Effort normal and breath sounds normal. No stridor. She has no wheezes. She has no rales.  Musculoskeletal:        General: No edema.     Cervical back: Neck supple.     Comments: Normal range of motion of the right upper extremity, able to Abduct and adduct her fingers.  She is able to make a fist.  No pain illustrated on physical exam  Skin: Skin is warm.       Impression & Recommendation(s):  Impression:   ICD-10-CM   1. Nonrheumatic aortic valve stenosis  I35.0     2. Status post left heart catheterization (LHC)  Z98.890     3. Coronary artery disease involving native coronary artery of native heart without angina pectoris  I25.10     4. Hyperlipidemia LDL goal <70  E78.5     5. Aortic atherosclerosis (HCC)  I70.0     6. Peripheral vascular disease, unspecified (HCC)  I73.9        Recommendation(s):  Patient is currently undergoing aortic stenosis workup.  He recently underwent right heart catheterization and coronary  angiography with interventional cardiology on April 09, 2024.  In the recent past patient has been calling the office due to concerns for possible  bleeding or feeling knots after recent procedure.  On physical examination patient is having appropriate range of motion, she is able to make a fist with her right hand and able to Abduct and adduct fingers.  She has 2+ radial pulses.  No bruit auscultated on physical examination.  No signs for compartment syndrome.  She does have ecchymosis on the dorsum and ventral aspect of the right lower extremity.   Her skin is frail which contributes to easy bruising.  Her right antecubital IV site healing slowly, no bleeding, minimal fluid, no pus.  Recommended that she removes the gauze and monitor for wound healing.  Patient is advised to call us  back if she has any fevers, chills, or signs of infection.  Based on physical examination right upper extremity arterial duplex likely of low yield.  Will hold off.    Recommended lifting no more than a gallon of milk for the next week and avoid injuries.   Recommend warm compresses and to elevate her right upper extremity when possible Instructed to keep the area dry. Advised her to call us  back or go to the ER if if she has swelling, difficulty with range of motion of the hand or fingers, cold extremities, etc.  Continue workup for aortic valve replacement.  No particular follow-up for this is needed at this time as long as she heals and returns back to baseline.  Otherwise she can follow-up with Dr. Court in 4 weeks if needed.   Orders Placed:  No orders of the defined types were placed in this encounter.    Final Medication List:   No orders of the defined types were placed in this encounter.   Medications Discontinued During This Encounter  Medication Reason   pantoprazole  (PROTONIX ) 40 MG tablet Patient Preference     Current Outpatient Medications:    albuterol  (PROVENTIL ) (2.5 MG/3ML) 0.083%  nebulizer solution, Take 3 mLs (2.5 mg total) by nebulization every 6 (six) hours as needed for wheezing or shortness of breath., Disp: 90 mL, Rfl: 12   albuterol  (VENTOLIN  HFA) 108 (90 Base) MCG/ACT inhaler, Inhale 2 puffs into the lungs every 4 (four) hours as needed for wheezing or shortness of breath., Disp: 3 each, Rfl: 3   aspirin  81 MG EC tablet, Take 1 tablet (81 mg total) by mouth daily. Swallow whole., Disp: 30 tablet, Rfl: 0   atorvastatin  (LIPITOR ) 80 MG tablet, Take 1 tablet (80 mg total) by mouth daily at 6 PM., Disp: 90 tablet, Rfl: 1   budeson-glycopyrrolate-formoterol (BREZTRI  AEROSPHERE) 160-9-4.8 MCG/ACT AERO inhaler, Inhale 2 puffs into the lungs 2 (two) times daily., Disp: 10.7 g, Rfl: 11   carvedilol  (COREG ) 3.125 MG tablet, TAKE 1 TABLET(3.125 MG) BY MOUTH TWICE DAILY WITH A MEAL, Disp: 180 tablet, Rfl: 2   clopidogrel  (PLAVIX ) 75 MG tablet, Take 1 tablet (75 mg total) by mouth daily., Disp: 90 tablet, Rfl: 1   estrogens, conjugated, (PREMARIN) 1.25 MG tablet, Take 1.25 mg by mouth daily., Disp: , Rfl:    feeding supplement (ENSURE ENLIVE / ENSURE PLUS) LIQD, Take 237 mLs by mouth 2 (two) times daily between meals., Disp: 237 mL, Rfl: 0   furosemide  (LASIX ) 40 MG tablet, Take 0.5 tablets (20 mg total) by mouth daily. TAKE ONE-HALF (1/2) TABLET (20 MG TOTAL) DAILY, Disp: 45 tablet, Rfl: 1   hydrOXYzine  (ATARAX ) 25 MG tablet, Take 25 mg by mouth every 8 (eight) hours as needed for itching., Disp: , Rfl:    lamoTRIgine  (LAMICTAL ) 100  MG tablet, Take 1 tablet (100 mg total) by mouth 2 (two) times daily., Disp: 60 tablet, Rfl: 0   levothyroxine  (SYNTHROID ) 25 MCG tablet, Take 1 tablet (25 mcg total) by mouth daily before breakfast., Disp: 90 tablet, Rfl: 3   naproxen sodium (ALEVE) 220 MG tablet, Take 220 mg by mouth daily as needed (Pain)., Disp: , Rfl:    nicotine  polacrilex (NICORETTE ) 2 MG gum, Take 1 each (2 mg total) by mouth as needed for smoking cessation. (Patient not taking:  Reported on 04/05/2024), Disp: 100 tablet, Rfl: 1   potassium chloride  SA (KLOR-CON  M) 20 MEQ tablet, TAKE 1 TABLET DAILY (KEEP UPCOMING APPOINTMENT FOR FUTURE REFILLS), Disp: 90 tablet, Rfl: 1   progesterone (PROMETRIUM) 200 MG capsule, Take 200 mg by mouth at bedtime., Disp: , Rfl:    Wheat Dextrin (BENEFIBER PO), Take 1-2 Doses by mouth daily., Disp: , Rfl:   Consent:   NA  Disposition:   Continue regular scheduled office visits with her providers. May see Dr. Court in 4 weeks for reevaluation if needed.  Her questions and concerns were addressed to her satisfaction. She voices understanding of the recommendations provided during this encounter.    Signed, Madonna Michele HAS, [facility]  A Division of [facility] [address], [place], [place] [address]  04/18/2024 6:25 PM

## 2024-04-19 ENCOUNTER — Ambulatory Visit: Payer: Medicare Other | Admitting: Neurology

## 2024-04-19 ENCOUNTER — Encounter: Payer: Self-pay | Admitting: Neurology

## 2024-04-19 VITALS — BP 141/58 | HR 68 | Ht 61.0 in | Wt 83.6 lb

## 2024-04-19 DIAGNOSIS — G40909 Epilepsy, unspecified, not intractable, without status epilepticus: Secondary | ICD-10-CM | POA: Diagnosis not present

## 2024-04-19 MED ORDER — LAMOTRIGINE 100 MG PO TABS: 100.0000 mg | ORAL_TABLET | Freq: Two times a day (BID) | ORAL | 3 refills | Status: DC

## 2024-04-19 NOTE — Progress Notes (Signed)
 Patient: Christie Moreno Date of Birth: [birth year]  Reason for Visit: Follow up History from: Patient Primary Neurologist: Onita  ASSESSMENT AND PLAN 68 y.o. year old female   1.  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 had a history of chronic headaches, increased frequency over the past couple years, daily basis, vortex area moderate to severe pressure headaches, occasionally of his visual change      Over past 6 months, she had frequent confusion spells, it can happen up to 5 episode in a day, she was eating, suddenly had a glaring stare in her eyes, unresponsive, stirring her soup with her fingers, had violent cough, came around in few minutes.   There was also multiple episode during sleep of making sucking noise, unpurposeful hand movement, confused unresponsive lasting few minutes,   She denies significant visual change, no lateralized motor or sensory deficit,   She presented to the emergency room June 14 2012 following one episode, MRI of the brain with and without contrast showed few scattered deep subcortical white matter disease, no contrast enhancement most consistent with small vessel disease.     UPDATE Sept 21 2020: She complains of occasionally recurrent confusion spells, often preceded by strong sense of strange smell, then staring spells, lasting for few seconds, there was no generalized tonic-clonic activity, despite taking Topamax  50 mg 3 tablets twice a day, it happened about couple times each month,   Previous EEG was abnormal, there was evidence of generalized epileptiform discharges.   Update July 03, 2019 SS: After last visit her Topamax  was increased to 200 mg twice a day, laboratory evaluation  indicated mild elevated TSH, EEG 05/14/2019 was normal, Topamax  level was therapeutic.   With the increase in Topamax , she denies further episodes of strong smells.  She reports she has continued to have a few staring episodes, feeling she cannot focus, she does not lose consciousness, she is aware the spells are happening.  She indicates the spells have been occurring on and off for years.  She has not had seizures where she loses consciousness since 2013.  She has not had her thyroid evaluated yet.  She continues to smoke. She thinks the higher dose of Topamax  makes her feel fatigued.     Update August 10, 2021 She has been treated with Topamax  up to 200 mg twice a day, continues to smoke at least a pack a day, continue complains of intermittent spell of smell something funny,   She recently attributed that it is due to her husband's heart attack and his medication changes, but ever since her husband's illness, significant medication changes, and constant strong smell around him, she no longer have recurrent episodic strange smell  Denied loss of consciousness, generalized seizure episode  She continue to lose weight, less than 100 pounds today, decreased appetite, does complains of depression anxiety  Update April 14, 2023 SS: In January 2023 Dr. Onita switched from Topamax  to Lamictal  100 mg twice daily.  EEG was normal. Remains on Lamictal  100 mg BID. Is 81 lbs, was 89 lbs Jan 2023. No seizures that she aware of. Headache last 2 days with history, a lot of stress at home, took Aleve, will try Tylenol . On Synthroid .    Update  April 19, 2024 SS: Lamictal  level was 8. No seizures, remains on Lamictal  100 mg BID. Had heart cath recently. Only gained 2 lbs, up to 81 lbs. A lot of stress with her medical issues. Has not been driving.   REVIEW OF SYSTEMS: Out of a complete 14 system review of symptoms, the patient complains only of the following symptoms, and all other reviewed systems are  negative.  See HPI  ALLERGIES: Allergies  Allergen Reactions   Jardiance  [Empagliflozin ]     UTIs   Azithromycin Nausea And Vomiting   Penicillins Itching and Rash   Percocet [Oxycodone-Acetaminophen ] Rash    HOME MEDICATIONS: Outpatient Medications Prior to Visit  Medication Sig Dispense Refill   albuterol  (PROVENTIL ) (2.5 MG/3ML) 0.083% nebulizer solution Take 3 mLs (2.5 mg total) by nebulization every 6 (six) hours as needed for wheezing or shortness of breath. 90 mL 12   albuterol  (VENTOLIN  HFA) 108 (90 Base) MCG/ACT inhaler Inhale 2 puffs into the lungs every 4 (four) hours as needed for wheezing or shortness of breath. 3 each 3   aspirin  81 MG EC tablet Take 1 tablet (81 mg total) by mouth daily. Swallow whole. 30 tablet 0   atorvastatin  (LIPITOR ) 80 MG tablet Take 1 tablet (80 mg total) by mouth daily at 6 PM. 90 tablet 1   budeson-glycopyrrolate-formoterol (BREZTRI  AEROSPHERE) 160-9-4.8 MCG/ACT AERO inhaler Inhale 2 puffs into the lungs 2 (two) times daily. 10.7 g 11   carvedilol  (COREG ) 3.125 MG tablet TAKE 1 TABLET(3.125 MG) BY MOUTH TWICE DAILY WITH A MEAL 180 tablet 2   clopidogrel  (PLAVIX ) 75 MG tablet Take 1 tablet (75 mg total) by mouth daily. 90 tablet 1   estrogens, conjugated, (PREMARIN) 1.25 MG tablet Take 1.25 mg by mouth daily.     feeding supplement (ENSURE ENLIVE / ENSURE PLUS) LIQD Take 237 mLs by mouth 2 (two) times daily between meals. 237 mL 0   furosemide  (LASIX ) 40 MG tablet Take 0.5 tablets (20 mg total) by mouth daily. TAKE ONE-HALF (1/2) TABLET (20 MG TOTAL) DAILY 45 tablet 1   hydrOXYzine  (ATARAX ) 25 MG tablet Take 25 mg by mouth every 8 (eight) hours as needed for itching. (Patient taking differently: Take 25 mg by mouth as needed for itching.)     levothyroxine  (SYNTHROID ) 25 MCG tablet Take 1 tablet (25 mcg total) by mouth daily before breakfast. 90 tablet 3   potassium chloride  SA (KLOR-CON  M) 20 MEQ tablet TAKE 1 TABLET DAILY (KEEP UPCOMING  APPOINTMENT FOR FUTURE REFILLS) 90 tablet 1   progesterone (PROMETRIUM) 200 MG capsule Take 200 mg by mouth at bedtime.     Wheat Dextrin (BENEFIBER PO) Take 1-2 Doses by mouth daily.     lamoTRIgine  (LAMICTAL ) 100 MG tablet Take 1 tablet (100 mg total) by mouth 2 (two) times daily. 60 tablet 0   naproxen sodium (ALEVE) 220 MG tablet Take 220 mg by mouth daily as needed (Pain).     nicotine  polacrilex (NICORETTE ) 2 MG gum Take 1 each (2 mg total) by mouth as needed for smoking cessation. (Patient not taking: Reported on 04/19/2024) 100 tablet 1   No facility-administered medications prior to visit.    PAST MEDICAL HISTORY: Past Medical History:  Diagnosis Date   Complex partial seizure (HCC) 04/22/2015   Convulsions (HCC) 04/19/2013   Heart failure (HCC)    Hyperlipidemia    Hypertension    Seizures (HCC)    none in years, recently had medication change   Thyroid disease  PAST SURGICAL HISTORY: Past Surgical History:  Procedure Laterality Date   COLONOSCOPY     PERIPHERAL INTRAVASCULAR LITHOTRIPSY  07/25/2023   Procedure: PERIPHERAL INTRAVASCULAR LITHOTRIPSY;  Surgeon: Pearline Norman RAMAN, MD;  Location: [facility] INVASIVE CV LAB;  Service: Cardiovascular;;   PERIPHERAL VASCULAR INTERVENTION  07/25/2023   Procedure: PERIPHERAL VASCULAR INTERVENTION;  Surgeon: Pearline Norman RAMAN, MD;  Location: [facility] INVASIVE CV LAB;  Service: Cardiovascular;;   RIGHT HEART CATH AND CORONARY ANGIOGRAPHY N/A 04/09/2024   Procedure: RIGHT HEART CATH AND CORONARY ANGIOGRAPHY;  Surgeon: Wendel Lurena POUR, MD;  Location: [facility] INVASIVE CV LAB;  Service: Cardiovascular;  Laterality: N/A;   RIGHT/LEFT HEART CATH AND CORONARY ANGIOGRAPHY N/A 11/05/2021   Procedure: RIGHT/LEFT HEART CATH AND CORONARY ANGIOGRAPHY;  Surgeon: Claudene Victory ORN, MD;  Location: [facility] INVASIVE CV LAB;  Service: Cardiovascular;  Laterality: N/A;   VISCERAL ANGIOGRAPHY N/A 07/25/2023   Procedure: VISCERAL ANGIOGRAPHY;  Surgeon: Pearline Norman RAMAN, MD;   Location: [facility] INVASIVE CV LAB;  Service: Cardiovascular;  Laterality: N/A;    FAMILY HISTORY: Family History  Adopted: Yes  Problem Relation Age of Onset   Breast cancer Mother    Diabetes Mother    Stroke Mother    Heart Problems Father    Diabetes Sister    Kidney failure Sister    Breast cancer Maternal Aunt    Lung cancer Half-Sister    Colon cancer Neg Hx    Esophageal cancer Neg Hx    Epilepsy Neg Hx    Stomach cancer Neg Hx    Rectal cancer Neg Hx     SOCIAL HISTORY: Social History   Socioeconomic History   Marital status: Significant Other    Spouse name: Gaither Salt   Number of children: 1   Years of education: Not on file   Highest education level: Bachelor's degree (e.g., BA, AB, BS)  Occupational History   Occupation: retired  Tobacco Use   Smoking status: Some Days    Current packs/day: 0.25    Average packs/day: 1 pack/day for 52.0 years (52.0 ttl pk-yrs)    Types: Cigarettes    Start date: 11/09/1969    Last attempt to quit: 11/09/2021    Passive exposure: Current   Smokeless tobacco: Never   Tobacco comments:    08/31/2023 Patient smoke about 3 cigarettes daily    Currently 1 pack every 2-3 days    04/06/2022 patient states when she smokes its 1 cigarette    04/05/2023 Patient smokes 2 cigarettes some days   Vaping Use   Vaping status: Never Used  Substance and Sexual Activity   Alcohol use: Not Currently    Comment: socially   Drug use: No   Sexual activity: Not on file  Other Topics Concern   Not on file  Social History Narrative   Pt lives with family    Pt doesn't work    Social Drivers of Corporate investment banker Strain: Low Risk  (03/22/2024)   Overall Financial Resource Strain (CARDIA)    Difficulty of Paying Living Expenses: Not hard at all  Food Insecurity: No Food Insecurity (03/22/2024)   Hunger Vital Sign    Worried About Running Out of Food in the Last Year: Never true    Ran Out of Food in the Last Year: Never true   Transportation Needs: No Transportation Needs (03/22/2024)   PRAPARE - Administrator, Civil Service (Medical): No    Lack of Transportation (Non-Medical): No  Physical Activity:  Inactive (03/22/2024)   Exercise Vital Sign    Days of Exercise per Week: 0 days    Minutes of Exercise per Session: 0 min  Stress: Stress Concern Present (03/22/2024)   [name] - Occupational Stress Questionnaire    Feeling of Stress: Rather much  Social Connections: Moderately Isolated (03/22/2024)   Social Connection and Isolation Panel    Frequency of Communication with Friends and Family: More than three times a week    Frequency of Social Gatherings with Friends and Family: Once a week    Attends Religious Services: Never    Database administrator or Organizations: No    Attends Banker Meetings: Never    Marital Status: Living with partner  Intimate Partner Violence: Not At Risk (03/22/2024)   Humiliation, Afraid, Rape, and Kick questionnaire    Fear of Current or Ex-Partner: No    Emotionally Abused: No    Physically Abused: No    Sexually Abused: No    PHYSICAL EXAM  Vitals:   04/19/24 1454  BP: (!) 141/58  Pulse: 68  Weight: 83 lb 9.6 oz (37.9 kg)  Height: 5' 1 (1.549 m)    Body mass index is 15.8 kg/m.  Generalized: Well developed, in no acute distress, thin  Neurological examination  Mentation: Alert oriented to time, place, history taking. Follows all commands speech and language fluent Cranial nerve II-XII: Pupils were equal round reactive to light. Extraocular movements were full, visual field were full on confrontational test. Facial sensation and strength were normal.Head turning and shoulder shrug  were normal and symmetric. Motor: The motor testing reveals 5 over 5 strength of all 4 extremities. Good symmetric motor tone is noted throughout.  Sensory: Sensory testing is intact to soft touch on all 4 extremities. No evidence  of extinction is noted.  Coordination: Cerebellar testing reveals good finger-nose-finger and heel-to-shin bilaterally.  Gait and station: Gait is normal.   DIAGNOSTIC DATA (LABS, IMAGING, TESTING) - I reviewed patient records, labs, notes, testing and imaging myself where available.  Lab Results  Component Value Date   WBC 7.1 04/05/2024   HGB 9.9 (L) 04/09/2024   HCT 29.0 (L) 04/09/2024   MCV 76 (L) 04/05/2024   PLT 266 04/05/2024      Component Value Date/Time   NA 140 04/09/2024 0910   NA 139 04/05/2024 1113   K 3.3 (L) 04/09/2024 0910   CL 103 04/05/2024 1113   CO2 19 (L) 04/05/2024 1113   GLUCOSE 65 (L) 04/05/2024 1113   GLUCOSE 181 (H) 10/29/2023 0354   BUN 24 04/05/2024 1113   CREATININE 0.85 04/05/2024 1113   CALCIUM  8.9 04/05/2024 1113   PROT 6.5 10/24/2023 1740   PROT 6.0 06/10/2023 0926   ALBUMIN 2.5 (L) 10/29/2023 0354   ALBUMIN 3.9 06/10/2023 0926   AST 31 10/24/2023 1740   ALT 20 10/24/2023 1740   ALKPHOS 56 10/24/2023 1740   BILITOT 0.3 10/24/2023 1740   BILITOT 0.3 06/10/2023 0926   GFRNONAA >60 10/29/2023 0354   GFRAA 88 04/23/2019 1021   Lab Results  Component Value Date   CHOL 150 06/10/2023   HDL 84 06/10/2023   LDLCALC 51 06/10/2023   TRIG 77 06/10/2023   CHOLHDL 1.8 06/10/2023   Lab Results  Component Value Date   HGBA1C 5.9 (H) 06/10/2023   No results found for: [id number] Lab Results  Component Value Date   TSH 0.450 10/28/2023    Lauraine Born, AGNP-C, DNP  04/19/2024, 3:09 PM [facility] [address] [place], [place] [address] [phone]

## 2024-04-19 NOTE — Patient Instructions (Signed)
 Great to see you today! Continue Lamictal  for seizure prevention Call for seizure activity Follow-up in 1 year.  Thanks!!

## 2024-04-20 ENCOUNTER — Ambulatory Visit (HOSPITAL_COMMUNITY)
Admission: RE | Admit: 2024-04-20 | Discharge: 2024-04-20 | Disposition: A | Discharge status: Home / Self Care | Source: Ambulatory Visit | Attending: Cardiology | Admitting: Cardiology

## 2024-04-20 DIAGNOSIS — Z01810 Encounter for preprocedural cardiovascular examination: Secondary | ICD-10-CM | POA: Diagnosis not present

## 2024-04-20 DIAGNOSIS — I82522 Chronic embolism and thrombosis of left iliac vein: Secondary | ICD-10-CM | POA: Diagnosis not present

## 2024-04-20 DIAGNOSIS — I701 Atherosclerosis of renal artery: Secondary | ICD-10-CM | POA: Insufficient documentation

## 2024-04-20 DIAGNOSIS — I350 Nonrheumatic aortic (valve) stenosis: Secondary | ICD-10-CM

## 2024-04-20 DIAGNOSIS — I358 Other nonrheumatic aortic valve disorders: Secondary | ICD-10-CM | POA: Diagnosis not present

## 2024-04-20 DIAGNOSIS — I714 Abdominal aortic aneurysm, without rupture, unspecified: Secondary | ICD-10-CM | POA: Diagnosis not present

## 2024-04-20 MED ORDER — IOHEXOL 350 MG/ML SOLN
100.0000 mL | Freq: Once | INTRAVENOUS | Status: AC | PRN
Start: 2024-04-20 — End: 2024-04-20
  Administered 2024-04-20: 100 mL via INTRAVENOUS

## 2024-04-20 NOTE — Progress Notes (Signed)
 Procedure Type: Isolated AVR Perioperative Outcome Estimate % Operative Mortality 3.5% Morbidity & Mortality 9.54% Stroke 2.33% Renal Failure 1.6% Reoperation 3.87% Prolonged Ventilation 4.99% Deep Sternal Wound Infection 0.05% Long Hospital Stay (>14 days) 3.96% Short Hospital Stay (<6 days)* 43.8%

## 2024-04-22 ENCOUNTER — Ambulatory Visit: Payer: Self-pay | Admitting: Internal Medicine

## 2024-04-23 ENCOUNTER — Other Ambulatory Visit: Payer: Self-pay

## 2024-04-23 NOTE — Patient Instructions (Signed)
 Visit Information  Thank you for taking time to visit with me today. Please don't hesitate to contact me if I can be of assistance to you before our next scheduled telephone appointment.  Our next appointment is by telephone on 05/14/2024 at 2:30 pm  Following is a copy of your care plan:   Goals Addressed             This Visit's Progress    VBCI Care Plan-CHF       Problems:  Chronic Disease Management support and education needs related to CHF  Goal: Over the next 30 days the Patient will attend all scheduled medical appointments: with PCP, cardiologist as evidenced by completed visit notes uploaded to EMR   demonstrate Ongoing adherence to prescribed treatment plan for CHF as evidenced by stable weight and patient report of no symptoms of fluid retention verbalize basic understanding of CHF disease process and self health management plan as evidenced by verbal explanation lifestyle changes and consistent medication compliance   Interventions:   STOP LIGHT  Heart Failure (HF) Definition: When the heart muscle does not pump enough blood through the heart, leaving it very weak.   Some Heart Failure signs/symptoms:  Shortness of breath with or without activities Weakness Swelling in your legs, ankles, feet, and/or abdomen Sudden weight gain from fluid retention Coughing or wheezing with possible mucus build up  Things you can do: Improve your quality of life by managing your stress levels, reducing the salt in your diet, exercise and lose weight if necessary.     Heart Failure Interventions: Provided education on low sodium diet Assessed need for readable accurate scales in home Advised patient to weigh each morning after emptying bladder Discussed importance of daily weight and advised patient to weigh and record daily Reviewed role of diuretics in prevention of fluid overload and management of heart failure; Discussed the importance of keeping all appointments  with provider  Patient Self-Care Activities:  Attend all scheduled provider appointments Call pharmacy for medication refills 3-7 days in advance of running out of medications Call provider office for new concerns or questions  Take medications as prescribed   keep legs up while sitting watch for swelling in feet, ankles and legs every day develop a rescue plan follow rescue plan if symptoms flare-up  Plan:  Telephone follow up appointment with care management team member scheduled for:  05/14/24 at 2:30 PM with Channing Larry, RN          VBCI RN Care Plan        Problems:  Chronic Disease Management support and education needs related to Atrial Fibrillation  Goal: Over the next 30 days the Patient will attend all scheduled medical appointments: with PCP and specialist as evidenced by keeping al scheduled appointments        demonstrate Ongoing adherence to prescribed treatment plan for AFIB as evidenced by no admissions to the hospital verbalize basic understanding of AFIB disease process and self health management plan as evidenced by verbal explanation lifestyle changes and consistent medication compliance   Interventions:   STOP LIGHT   Atrial Fibrillation (Afib) Definition: A fast and irregular heart rhythm that can be occasional, persistent, or become permanent and requires treatment. Atrial fibrillation, sometimes called Afib, can lead to other medical conditions such as high blood pressure, stroke, heart attack, heart failure, or other health conditions. Most people are unaware they have this condition until it is found during a physical examination or EKG (test that checks  electrical activity of the heart).    Some Atrial Fibrillation signs/symptoms:  A racing or irregular heartbeat that may cause an uncomfortable flip flopping feeling in the chest Weakness or feeling unusually tired Feeling faint Dizziness Shortness of breath with or without chest  pain  Things you can do: Take the medications prescribed to control your heart rate and rhythm and reduce your risk of blood clot formation (anticoagulants or antiplatelet medications/blood thinners).  Exercise and maintain an ideal body weight, as prescribed by your physician.  Avoid smoking, alcohol, and illicit drug use.     AFIB Interventions:   Counseled on increased risk of stroke due to Afib and benefits of anticoagulation for stroke prevention Reviewed importance of adherence to anticoagulant exactly as prescribed Counseled on avoidance of NSAIDs due to increased bleeding risk with anticoagulants Counseled on importance of regular laboratory monitoring as prescribed Counseled on seeking medical attention after a head injury or if there is blood in the urine/stool Afib action plan reviewed Screening for signs and symptoms of depression related to chronic disease state  Assessed social determinant of health barriers  Patient Self-Care Activities:  Attend all scheduled provider appointments Call pharmacy for medication refills 3-7 days in advance of running out of medications Perform all self care activities independently  Take medications as prescribed   check pulse (heart) rate once a day make a plan to exercise regularly take medicine as prescribed  Plan:  Telephone follow up appointment with care management team member scheduled for:  05/14/24  230 pmwith Channing Larry RN Care Manager          VBCI RN Care Plan- COPD       Problems:  Chronic Disease Management support and education needs related to COPD  Goal: Over the next 30 days the Patient will attend all scheduled medical appointments: with PCP, cardiology as evidenced by completed visit notes uploaded to EMR     demonstrate Ongoing adherence to prescribed treatment plan for COPD as evidenced by patient report of decreased symptoms, not needing increased PRN Albuterol  verbalize basic understanding of COPD  disease process and self health management plan as evidenced by verbal explanation lifestyle changes and consistent medication compliance   Interventions:   STOP LIGHT  Chronic Obstrutive Pulmonary Disease (COPD) Definition: Different groups of lung diseases that make it difficult to breathe due to a blocked airflow. Smoking can lead to a chronic bronchitis and cause the airways to become red, swollen, and inflamed. This makes the airways narrow making it harder for air to move in and out of the lungs. After a while, this causes extra mucus, which clogs the airway. COPD can't be reversed; however, there are certain treatments that can help reduce the symptoms that may cause further damage.   Some COPD signs/symptoms:  Shortness of breath that may increase with activities Wheezing and tightness in the chest area Chronic cough that may cause mucus (sputum) production that may be clear, yellow, or greenish in color Lips and/or fingernails turn blue or gray in color Frequent lung infections  Things you can do: Avoid smoke and air pollution Exercise on a regular basis Keep your airway clear from mucus build up  Control your cough by drinking plenty of water  Use a humidifier, if needed Visit your doctor on a regular basis Receive your annual flu vaccine      COPD Interventions: Advised patient to track and manage COPD triggers Advised patient to self assesses COPD action plan zone and make appointment with  provider if in the yellow zone for 48 hours without improvement Screening for signs and symptoms of depression related to chronic disease state  Advised the patient to continue with smoking cessation. Pick up nicotine  gum and begin using Avoid exposure to irritants as much as possible (dust, pet hair, heat, etc) Continue to use your Breztri  inhaler as prescribed and Albuterol  inhaler as a rescue inhaler  Patient Self-Care Activities:  Attend all scheduled provider  appointments Call pharmacy for medication refills 3-7 days in advance of running out of medications Call provider office for new concerns or questions  Take medications as prescribed   identify and remove indoor air pollutants develop a rescue plan follow rescue plan if symptoms flare-up  Plan:  Telephone follow up appointment with care management team member scheduled for:  05/14/24 at 230 PM with Channing Larry, RN               Patient verbalizes understanding of instructions and care plan provided today and agrees to view in MyChart. Active MyChart status and patient understanding of how to access instructions and care plan via MyChart confirmed with patient.     The patient has been provided with contact information for the care management team and has been advised to call with any health related questions or concerns.   Please call the care guide team at [phone] if you need to cancel or reschedule your appointment.   Please call 1-800-273-TALK (toll free, 24 hour hotline) if you are experiencing a Mental Health or Behavioral Health Crisis or need someone to talk to.  Coriana Angello A. Larry RN, BA, [facility], CRRN [place]  [facility] Population Health RN Care Manager Direct Dial: [phone]  Fax: [phone]

## 2024-04-24 NOTE — Patient Outreach (Signed)
 Complex Care Management   Visit Note  04/24/2024  Name:  Christie Moreno MRN: [id number] DOB: 03-Sep-1955  Situation: Referral received for Complex Care Management related to Heart Failure, COPD, and continued Tobacco use I obtained verbal consent from Patient.  Visit completed with Patient  on the phone  Background:   Past Medical History:  Diagnosis Date   Complex partial seizure (HCC) 04/22/2015   Convulsions (HCC) 04/19/2013   Heart failure (HCC)    Hyperlipidemia    Hypertension    Seizures (HCC)    none in years, recently had medication change   Thyroid disease     Assessment: Patient Reported Symptoms:  Cognitive Cognitive Status: No symptoms reported, Insightful and able to interpret abstract concepts, Normal speech and language skills, Alert and oriented to person, place, and time Cognitive/Intellectual Conditions Management [RPT]: None reported or documented in medical history or problem list      Neurological Neurological Review of Symptoms: No symptoms reported Neurological Management Strategies: Medication therapy Neurological Comment: Hx of seizures, on Lamictal ; also reports occasional headaches  HEENT HEENT Symptoms Reported: No symptoms reported      Cardiovascular Cardiovascular Symptoms Reported: Swelling in legs or feet Does patient have uncontrolled Hypertension?: No Cardiovascular Management Strategies: Medication therapy, Routine screening, Weight management Do You Have a Working Readable Scale?: Yes Cardiovascular Self-Management Outcome: 3 (uncertain) Cardiovascular Comment: tries to keep legs elevated as much as possible  Respiratory Respiratory Symptoms Reported: Shortness of breath, Dry cough Other Respiratory Symptoms: Continues to smoke. Continues to experience dyspnea with exertion and cough intermittently that is improved with rest. Compliant w/ maintenace inhaler use and uses Albuterol  inhaler as needed for dyspnea. Additional Respiratory  Details: Patient states she has been smoking more than usual d/t anxiety regarding probable need to undergo TAVR in the near future. States she hopes to cut back again in the near future. Also states her Lamictal  RX helps with her anxiety but still finds smoking as her main go-to for coping with stress. Respiratory Management Strategies: Medication therapy Respiratory Self-Management Outcome: 3 (uncertain)  Endocrine Endocrine Symptoms Reported: No symptoms reported, Hypoglycemia Is patient diabetic?: No    Gastrointestinal Gastrointestinal Symptoms Reported: No symptoms reported Additional Gastrointestinal Details: Pt's BMI <16, is drinking Ensure supplements for increased protein/calories Gastrointestinal Management Strategies: Diet modification, Nutrition support    Genitourinary Genitourinary Symptoms Reported: No symptoms reported    Integumentary Integumentary Symptoms Reported: Bruising Additional Integumentary Details: on anticoagulant Skin Management Strategies: Medication therapy  Musculoskeletal Musculoskelatal Symptoms Reviewed: No symptoms reported   Falls in the past year?: Yes Number of falls in past year: 2 or more Was there an injury with Fall?: Yes Fall Risk Category Calculator: 3 Patient Fall Risk Level: High Fall Risk Patient at Risk for Falls Due to: History of fall(s) Fall risk Follow up: Falls prevention discussed, Education provided, Falls evaluation completed  Psychosocial Psychosocial Symptoms Reported: Anxiety - if selected complete GAD Behavioral Management Strategies: Coping strategies Major Change/Loss/Stressor/Fears (CP): Medical condition, self Techniques to Cope with Loss/Stress/Change: Diversional activities, Substance use (still smoking - has not been able to cut back as intended d/t anxiety regarding probable need for TAVR in near future) Quality of Family Relationships: supportive, involved Do you feel physically threatened by others?: No     04/24/2024    PHQ2-9 Depression Screening   Little interest or pleasure in doing things Not at all  Feeling down, depressed, or hopeless Not at all  PHQ-2 - Total Score 0  Trouble falling or  staying asleep, or sleeping too much Not at all  Feeling tired or having little energy Several days  Poor appetite or overeating  Not at all  Feeling bad about yourself - or that you are a failure or have let yourself or your family down Not at all  Trouble concentrating on things, such as reading the newspaper or watching television Not at all  Moving or speaking so slowly that other people could have noticed.  Or the opposite - being so fidgety or restless that you have been moving around a lot more than usual Not at all  Thoughts that you would be better off dead, or hurting yourself in some way Not at all  PHQ2-9 Total Score 1  If you checked off any problems, how difficult have these problems made it for you to do your work, take care of things at home, or get along with other people Somewhat difficult  Depression Interventions/Treatment PHQ2-9 Score <4 Follow-up Not Indicated    There were no vitals filed for this visit.  Medications Reviewed Today     Reviewed by Gordy Channing LABOR, RN (Registered Nurse) on 04/23/24 at 1609  Med List Status: <None>   Medication Order Taking? Sig Documenting Provider Last Dose Status Informant  albuterol  (PROVENTIL ) (2.5 MG/3ML) 0.083% nebulizer solution [id number] Yes Take 3 mLs (2.5 mg total) by nebulization every 6 (six) hours as needed for wheezing or shortness of breath. Chandra Toribio POUR, MD  Active Pharmacy Records, Self  albuterol  (VENTOLIN  HFA) [phone] Base) MCG/ACT inhaler [id number] Yes Inhale 2 puffs into the lungs every 4 (four) hours as needed for wheezing or shortness of breath. Chandra Toribio POUR, MD  Active Pharmacy Records, Self  aspirin  81 MG EC tablet [id number] Yes Take 1 tablet (81 mg total) by mouth daily. Swallow whole. Arrien, Elidia Toribio, MD  Active Self, Pharmacy Records  atorvastatin  (LIPITOR ) 80 MG tablet [id number] Yes Take 1 tablet (80 mg total) by mouth daily at 6 PM. Court Dorn PARAS, MD  Active Pharmacy Records, Self  budeson-glycopyrrolate-formoterol (BREZTRI  AEROSPHERE) 160-9-4.8 MCG/ACT AERO inhaler [id number] Yes Inhale 2 puffs into the lungs 2 (two) times daily. Chandra Toribio POUR, MD  Active Pharmacy Records, Self  carvedilol  (COREG ) 3.125 MG tablet [id number] Yes TAKE 1 TABLET(3.125 MG) BY MOUTH TWICE DAILY WITH A MEAL Court Dorn PARAS, MD  Active Pharmacy Records, Self  clopidogrel  (PLAVIX ) 75 MG tablet [id number] Yes Take 1 tablet (75 mg total) by mouth daily. Chandra Toribio POUR, MD  Active Pharmacy Records, Self  estrogens, conjugated, (PREMARIN) 1.25 MG tablet [id number] Yes Take 1.25 mg by mouth daily. [provider]  Active Self, Pharmacy Records           Med Note REGINO, [name]   Mon Nov 02, 2021  5:03 AM)    feeding supplement (ENSURE ENLIVE / ENSURE PLUS) LIQD [id number] Yes Take 237 mLs by mouth 2 (two) times daily between meals. Arrien, Elidia Toribio, MD  Active Self, Pharmacy Records           Med Note JACKOLYN WADDELL DEL   Thu Apr 05, 2024  2:11 PM)    furosemide  (LASIX ) 40 MG tablet [id number] Yes Take 0.5 tablets (20 mg total) by mouth daily. TAKE ONE-HALF (1/2) TABLET (20 MG TOTAL) DAILY Court Dorn PARAS, MD  Active Pharmacy Records, Self  hydrOXYzine  (ATARAX ) 25 MG tablet [id number] Yes Take 25 mg by mouth every 8 (eight) hours as needed for itching.  Patient taking differently: Take  25 mg by mouth as needed for itching.   [provider]  Active Pharmacy Records, Self  lamoTRIgine  (LAMICTAL ) 100 MG tablet [id number] Yes Take 1 tablet (100 mg total) by mouth 2 (two) times daily. Gayland Lauraine PARAS, NP  Active   levothyroxine  (SYNTHROID ) 25 MCG tablet [id number] Yes Take 1 tablet (25 mcg total) by mouth daily before breakfast. Wallace Joesph LABOR, PA  Active Self, Pharmacy Records  naproxen  sodium (ALEVE) 220 MG tablet [id number] Yes Take 220 mg by mouth daily as needed (Pain). [provider]  Active Pharmacy Records, Self  nicotine  polacrilex (NICORETTE ) 2 MG gum [id number]  Take 1 each (2 mg total) by mouth as needed for smoking cessation.  Patient not taking: Reported on 04/23/2024   Chandra Toribio POUR, MD  Active Pharmacy Records, Self  potassium chloride  SA (KLOR-CON  M) 20 MEQ tablet [id number] Yes TAKE 1 TABLET DAILY (KEEP UPCOMING APPOINTMENT FOR FUTURE REFILLS) Court Dorn PARAS, MD  Active Pharmacy Records, Self  progesterone (PROMETRIUM) 200 MG capsule [id number] Yes Take 200 mg by mouth at bedtime. [provider]  Active Self, Pharmacy Records  Wheat Dextrin (BENEFIBER PO) [id number] Yes Take 1-2 Doses by mouth daily. [provider]  Active Pharmacy Records, Self            Recommendation:   Specialty provider follow-up with Vascular (has upcoming doppler study) and Cardiology follow up (regarding probable need for TAVR)  Follow Up Plan:   Telephone follow up appointment date/time:  05/14/24 at 2:30 pm with RN Care Manager Channing LITTIE Channing A. Gordy RN, BA, [facility], CRRN   [facility] Population Health RN Care Manager Direct Dial: [phone]  Fax: [phone]

## 2024-05-02 ENCOUNTER — Encounter: Payer: Self-pay | Admitting: Surgery

## 2024-05-02 ENCOUNTER — Ambulatory Visit: Attending: Surgery | Admitting: Surgery

## 2024-05-02 VITALS — BP 150/76 | HR 90 | Resp 18 | Ht 61.0 in | Wt 83.0 lb

## 2024-05-02 DIAGNOSIS — I350 Nonrheumatic aortic (valve) stenosis: Secondary | ICD-10-CM | POA: Insufficient documentation

## 2024-05-02 NOTE — Progress Notes (Signed)
 Patient ID: Christie Moreno, female   DOB: October 18, 1955, 68 y.o.   MRN: [id number]    [facility]       [address]       [place] [address]             [phone]          CARDIOTHORACIC SURGERY CONSULTATION REPORT  PCP is Gayle Saddie FALCON, PA-C Referring Provider is Lurena Red, MD Primary Cardiologist is Dorn Lesches, MD  Reason for consultation:  Severe aortic stenosis  HPI:  The patient is a 68 year old woman with a history of hypertension, hyperlipidemia, ongoing tobacco abuse, mesenteric ischemia status post SMA balloon angioplasty and stenting in December 2024, left lower extremity ischemia due to 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  Echocardiogram in April 2023 showed an ejection fraction of 40 to 45% with a calcified aortic valve with a low mean gradient of 3 mmHg and a valve area by VTI of 2.0 cm.  Cardiac catheterization in April 2023 showed moderate stenoses in multiple coronary branches and GDMT was recommended.  In August 2023 an echocardiogram showed an improvement in ejection fraction to 50 to 55% with an increase in the aortic valve mean gradient to 12 mmHg and a valve area of 1.03 cm.  Her most recent echocardiogram on 03/12/2024 showed a mean aortic valve gradient of 18 mmHg with a peak of 29.  Aortic valve area was 0.83 cm by VTI.  Stroke-volume index was 37.  Dimensionless index was 0.28.  There was mild to moderate aortic insufficiency with a pressure half-time of 318 ms.  She underwent repeat cardiac catheterization on 04/09/2024 showing mild to moderate nonobstructive coronary disease that was not significantly changed compared to the study in 2023 and continue medical treatment was recommended.  She is here today by herself.  She reports  occasional episodes of shortness of breath with activity.  She was able to walking from the front door today without difficulty.  She reports some wheezing but continues to smoke a few cigarettes per day.  She denies any chest pain or pressure.  She has had no dizziness or syncope.  She does report some lower extremity edema particularly in the left leg.  Past Medical History:  Diagnosis Date   Complex partial seizure (HCC) 04/22/2015   Convulsions (HCC) 04/19/2013   Heart failure (HCC)    Hyperlipidemia    Hypertension    Seizures (HCC)    none in years, recently had medication change   Thyroid disease     Past Surgical History:  Procedure Laterality Date   COLONOSCOPY     PERIPHERAL INTRAVASCULAR LITHOTRIPSY  07/25/2023   Procedure: PERIPHERAL INTRAVASCULAR LITHOTRIPSY;  Surgeon: Pearline Norman RAMAN, MD;  Location: [facility] INVASIVE CV LAB;  Service: Cardiovascular;;   PERIPHERAL VASCULAR INTERVENTION  07/25/2023   Procedure: PERIPHERAL VASCULAR INTERVENTION;  Surgeon: Pearline Norman RAMAN, MD;  Location: [facility] INVASIVE CV LAB;  Service: Cardiovascular;;   RIGHT HEART CATH AND CORONARY ANGIOGRAPHY N/A 04/09/2024   Procedure: RIGHT HEART CATH AND CORONARY ANGIOGRAPHY;  Surgeon: Red Lurena POUR, MD;  Location: [facility] INVASIVE CV LAB;  Service: Cardiovascular;  Laterality: N/A;   RIGHT/LEFT HEART CATH AND CORONARY ANGIOGRAPHY N/A 11/05/2021   Procedure: RIGHT/LEFT HEART CATH AND CORONARY ANGIOGRAPHY;  Surgeon: Claudene Victory ORN, MD;  Location: [facility] INVASIVE  CV LAB;  Service: Cardiovascular;  Laterality: N/A;   VISCERAL ANGIOGRAPHY N/A 07/25/2023   Procedure: VISCERAL ANGIOGRAPHY;  Surgeon: Pearline Norman RAMAN, MD;  Location: [facility] INVASIVE CV LAB;  Service: Cardiovascular;  Laterality: N/A;    Family History  Adopted: Yes  Problem Relation Age of Onset   Breast cancer Mother    Diabetes Mother    Stroke Mother    Heart Problems Father    Diabetes Sister    Kidney failure Sister    Breast cancer Maternal Aunt     Lung cancer Half-Sister    Colon cancer Neg Hx    Esophageal cancer Neg Hx    Epilepsy Neg Hx    Stomach cancer Neg Hx    Rectal cancer Neg Hx     Social History   Socioeconomic History   Marital status: Significant Other    Spouse name: Gaither Salt   Number of children: 1   Years of education: Not on file   Highest education level: Bachelor's degree (e.g., BA, AB, BS)  Occupational History   Occupation: retired  Tobacco Use   Smoking status: Some Days    Current packs/day: 0.25    Average packs/day: 1 pack/day for 52.1 years (52.0 ttl pk-yrs)    Types: Cigarettes    Start date: 11/09/1969    Last attempt to quit: 11/09/2021    Passive exposure: Current   Smokeless tobacco: Never   Tobacco comments:    08/31/2023 Patient smoke about 3 cigarettes daily    Currently 1 pack every 2-3 days    04/06/2022 patient states when she smokes its 1 cigarette    04/05/2023 Patient smokes 2 cigarettes some days   Vaping Use   Vaping status: Never Used  Substance and Sexual Activity   Alcohol use: Not Currently    Comment: socially   Drug use: No   Sexual activity: Not on file  Other Topics Concern   Not on file  Social History Narrative   Pt lives with family    Pt doesn't work    Social Drivers of Corporate investment banker Strain: Low Risk  (03/22/2024)   Overall Financial Resource Strain (CARDIA)    Difficulty of Paying Living Expenses: Not hard at all  Food Insecurity: No Food Insecurity (04/23/2024)   Hunger Vital Sign    Worried About Running Out of Food in the Last Year: Never true    Ran Out of Food in the Last Year: Never true  Transportation Needs: No Transportation Needs (04/23/2024)   PRAPARE - Administrator, Civil Service (Medical): No    Lack of Transportation (Non-Medical): No  Physical Activity: Inactive (03/22/2024)   Exercise Vital Sign    Days of Exercise per Week: 0 days    Minutes of Exercise per Session: 0 min  Stress: Stress Concern  Present (03/22/2024)   [name] - Occupational Stress Questionnaire    Feeling of Stress: Rather much  Social Connections: Moderately Isolated (03/22/2024)   Social Connection and Isolation Panel    Frequency of Communication with Friends and Family: More than three times a week    Frequency of Social Gatherings with Friends and Family: Once a week    Attends Religious Services: Never    Database administrator or Organizations: No    Attends Banker Meetings: Never    Marital Status: Living with partner  Intimate Partner Violence: Not At Risk (04/23/2024)   Humiliation, Afraid,  Rape, and Kick questionnaire    Fear of Current or Ex-Partner: No    Emotionally Abused: No    Physically Abused: No    Sexually Abused: No    Prior to Admission medications   Medication Sig Start Date End Date Taking? Authorizing Provider  albuterol  (PROVENTIL ) (2.5 MG/3ML) 0.083% nebulizer solution Take 3 mLs (2.5 mg total) by nebulization every 6 (six) hours as needed for wheezing or shortness of breath. 11/09/23   Chandra Toribio POUR, MD  albuterol  (VENTOLIN  HFA) 108 [phone] Base) MCG/ACT inhaler Inhale 2 puffs into the lungs every 4 (four) hours as needed for wheezing or shortness of breath. 12/20/23   Chandra Toribio POUR, MD  aspirin  81 MG EC tablet Take 1 tablet (81 mg total) by mouth daily. Swallow whole. 11/06/21   Arrien, Elidia Toribio, MD  atorvastatin  (LIPITOR ) 80 MG tablet Take 1 tablet (80 mg total) by mouth daily at 6 PM. 03/05/24   Court Dorn PARAS, MD  budeson-glycopyrrolate-formoterol (BREZTRI  AEROSPHERE) 160-9-4.8 MCG/ACT AERO inhaler Inhale 2 puffs into the lungs 2 (two) times daily. 11/09/23   Chandra Toribio POUR, MD  carvedilol  (COREG ) 3.125 MG tablet TAKE 1 TABLET(3.125 MG) BY MOUTH TWICE DAILY WITH A MEAL 12/01/23   Court Dorn PARAS, MD  clopidogrel  (PLAVIX ) 75 MG tablet Take 1 tablet (75 mg total) by mouth daily. 02/08/24   Chandra Toribio POUR, MD  estrogens, conjugated,  (PREMARIN) 1.25 MG tablet Take 1.25 mg by mouth daily.    [provider]  feeding supplement (ENSURE ENLIVE / ENSURE PLUS) LIQD Take 237 mLs by mouth 2 (two) times daily between meals. 11/06/21   Arrien, Elidia Toribio, MD  furosemide  (LASIX ) 40 MG tablet Take 0.5 tablets (20 mg total) by mouth daily. TAKE ONE-HALF (1/2) TABLET (20 MG TOTAL) DAILY 03/02/24   Court Dorn PARAS, MD  hydrOXYzine  (ATARAX ) 25 MG tablet Take 25 mg by mouth every 8 (eight) hours as needed for itching. Patient taking differently: Take 25 mg by mouth as needed for itching.    [provider]  lamoTRIgine  (LAMICTAL ) 100 MG tablet Take 1 tablet (100 mg total) by mouth 2 (two) times daily. 04/19/24   Gayland Lauraine PARAS, NP  levothyroxine  (SYNTHROID ) 25 MCG tablet Take 1 tablet (25 mcg total) by mouth daily before breakfast. 06/24/23   Wallace Search A, PA  naproxen sodium (ALEVE) 220 MG tablet Take 220 mg by mouth daily as needed (Pain).    [provider]  nicotine  polacrilex (NICORETTE ) 2 MG gum Take 1 each (2 mg total) by mouth as needed for smoking cessation. Patient not taking: Reported on 04/23/2024 02/08/24   Chandra Toribio POUR, MD  potassium chloride  SA (KLOR-CON  M) 20 MEQ tablet TAKE 1 TABLET DAILY (KEEP UPCOMING APPOINTMENT FOR FUTURE REFILLS) 02/24/24   Court Dorn PARAS, MD  progesterone (PROMETRIUM) 200 MG capsule Take 200 mg by mouth at bedtime. 04/20/22   [provider]  Wheat Dextrin (BENEFIBER PO) Take 1-2 Doses by mouth daily.    [provider]    Current Outpatient Medications  Medication Sig Dispense Refill   albuterol  (PROVENTIL ) (2.5 MG/3ML) 0.083% nebulizer solution Take 3 mLs (2.5 mg total) by nebulization every 6 (six) hours as needed for wheezing or shortness of breath. 90 mL 12   albuterol  (VENTOLIN  HFA) 108 (90 Base) MCG/ACT inhaler Inhale 2 puffs into the lungs every 4 (four) hours as needed for wheezing or shortness of breath. 3 each 3   aspirin  81 MG EC tablet  Take 1 tablet (81 mg total) by mouth daily. Swallow whole. 30 tablet 0   atorvastatin  (LIPITOR ) 80 MG tablet Take 1 tablet (80 mg total) by mouth daily at 6 PM. 90 tablet 1   budeson-glycopyrrolate-formoterol (BREZTRI  AEROSPHERE) 160-9-4.8 MCG/ACT AERO inhaler Inhale 2 puffs into the lungs 2 (two) times daily. 10.7 g 11   carvedilol  (COREG ) 3.125 MG tablet TAKE 1 TABLET(3.125 MG) BY MOUTH TWICE DAILY WITH A MEAL 180 tablet 2   clopidogrel  (PLAVIX ) 75 MG tablet Take 1 tablet (75 mg total) by mouth daily. 90 tablet 1   estrogens, conjugated, (PREMARIN) 1.25 MG tablet Take 1.25 mg by mouth daily.     feeding supplement (ENSURE ENLIVE / ENSURE PLUS) LIQD Take 237 mLs by mouth 2 (two) times daily between meals. 237 mL 0   furosemide  (LASIX ) 40 MG tablet Take 0.5 tablets (20 mg total) by mouth daily. TAKE ONE-HALF (1/2) TABLET (20 MG TOTAL) DAILY 45 tablet 1   hydrOXYzine  (ATARAX ) 25 MG tablet Take 25 mg by mouth every 8 (eight) hours as needed for itching. (Patient taking differently: Take 25 mg by mouth as needed for itching.)     lamoTRIgine  (LAMICTAL ) 100 MG tablet Take 1 tablet (100 mg total) by mouth 2 (two) times daily. 180 tablet 3   levothyroxine  (SYNTHROID ) 25 MCG tablet Take 1 tablet (25 mcg total) by mouth daily before breakfast. 90 tablet 3   naproxen sodium (ALEVE) 220 MG tablet Take 220 mg by mouth daily as needed (Pain).     nicotine  polacrilex (NICORETTE ) 2 MG gum Take 1 each (2 mg total) by mouth as needed for smoking cessation. (Patient not taking: Reported on 04/23/2024) 100 tablet 1   potassium chloride  SA (KLOR-CON  M) 20 MEQ tablet TAKE 1 TABLET DAILY (KEEP UPCOMING APPOINTMENT FOR FUTURE REFILLS) 90 tablet 1   progesterone (PROMETRIUM) 200 MG capsule Take 200 mg by mouth at bedtime.     Wheat Dextrin (BENEFIBER PO) Take 1-2 Doses by mouth daily.     No current facility-administered medications for this visit.    Allergies  Allergen Reactions   Jardiance  [Empagliflozin ]     UTIs    Azithromycin Nausea And Vomiting   Penicillins Itching and Rash   Percocet [Oxycodone-Acetaminophen ] Rash      Review of Systems:   General:  normal appetite, + decreased energy, + weight gain, no weight loss, no fever  Cardiac:  no chest pain with exertion, no chest pain at rest, +SOB with moderate exertion, no resting SOB, no PND, no orthopnea, no palpitations, no arrhythmia, no atrial fibrillation, + LE edema, no dizzy spells, no syncope  Respiratory:  + exertional shortness of breath, no home oxygen, no productive cough, no dry cough, no bronchitis, + wheezing, no hemoptysis, no asthma, no pain with inspiration or cough, no sleep apnea, no CPAP at night  GI:   no difficulty swallowing, no reflux, no frequent heartburn, no hiatal hernia, no abdominal pain, no constipation, no diarrhea, no hematochezia, no hematemesis, no melena  GU:   no dysuria,  no frequency, no urinary tract infection, no hematuria, no kidney stones, no kidney disease  Vascular:  + pain suggestive of claudication, no pain in feet, no leg cramps, no varicose veins, no DVT, no non-healing foot ulcer  Neuro:   no stroke, no TIA's, no seizures, no headaches, no temporary blindness one eye,  no slurred speech, no peripheral neuropathy, no chronic pain, no instability of gait, no memory/cognitive dysfunction  Musculoskeletal: no arthritis,  no joint swelling, no myalgias, no difficulty walking, normal mobility   Skin:   no rash, no itching, no skin infections, no pressure sores or ulcerations  Psych:   no anxiety, no depression, no nervousness, no unusual recent stress  Eyes:   no blurry vision, no floaters, no recent vision changes, no glasses or contacts  ENT:   no hearing loss, no loose or painful teeth, + dentures  Hematologic:  + easy bruising, no abnormal bleeding, no clotting disorder, no frequent epistaxis  Endocrine:  no diabetes, does not check CBG's at home     Physical Exam:   BP (!) 150/76   Pulse 90    Resp 18   Ht 5' 1 (1.549 m)   Wt 83 lb (37.6 kg)   SpO2 96% Comment: RA  BMI 15.68 kg/m   General:  Thin, frail-appearing  HEENT:  Unremarkable, NCAT, PERLA, EOMI  Neck:   no JVD, no bruits, no adenopathy   Chest:   clear to auscultation, symmetrical breath sounds, no wheezes, no rhonchi   CV:   RRR, 3/6 systolic murmur RSB, no diastolic murmur  Abdomen:  soft, non-tender, no masses   Extremities:  warm, well-perfused, right dp and pt pulses palpable, none on left, mild bilateral lower extremity edema  Rectal/GU  Deferred  Neuro:   Grossly non-focal and symmetrical throughout  Skin:   Clean and dry, no rashes, no breakdown  Diagnostic Tests:  ECHOCARDIOGRAM REPORT       Patient Name:   Christie Moreno Date of Exam: 03/12/2024  Medical Rec #:  [id number]          Height:       61.5 in  Accession #:    [id number]         Weight:       83.0 lb  Date of Birth:  07-12-1956          BSA:          1.308 m  Patient Age:    68 years           BP:           112/68 mmHg  Patient Gender: F                  HR:           70 bpm.  Exam Location:  [address]   Procedure: 2D Echo, 3D Echo, Cardiac Doppler and Color Doppler (Both  Spectral            and Color Flow Doppler were utilized during procedure).   Indications:    I50.21 CHF    History:        Patient has prior history of Echocardiogram examinations,  most                 recent 03/15/2023. CHF, CAD; Risk Factors:Hypertension,  Family                 History of Coronary Artery Disease, Dyslipidemia and  Current                 Smoker.    Sonographer:    Heather Hawks RDCS  Referring Phys: JONATHAN J BERRY   IMPRESSIONS     1. Left ventricular ejection fraction, by estimation, is 55 to 60%. The  left ventricle has normal function. The left ventricle has no regional  wall motion abnormalities. Left ventricular diastolic parameters are  consistent with  Grade I diastolic  dysfunction (impaired relaxation).   2.  Right ventricular systolic function is normal. The right ventricular  size is normal. There is mildly elevated pulmonary artery systolic  pressure. The estimated right ventricular systolic pressure is 38.9 mmHg.   3. Left atrial size was mild to moderately dilated.   4. The mitral valve is degenerative. Trivial mitral valve regurgitation.  Mild mitral stenosis. The mean mitral valve gradient is 3.0 mmHg. Moderate  to severe mitral annular calcification.   5. The aortic valve is tricuspid. There is severe calcifcation of the  aortic valve. Aortic valve regurgitation is mild to moderate. Moderate to  severe paradoxical low flow/low gradient aortic valve stenosis. Aortic  valve area, by VTI measures 0.83 cm.  Aortic valve mean gradient measures 18.0 mmHg.   6. The inferior vena cava is normal in size with <50% respiratory  variability, suggesting right atrial pressure of 8 mmHg.   FINDINGS   Left Ventricle: Left ventricular ejection fraction, by estimation, is 55  to 60%. The left ventricle has normal function. The left ventricle has no  regional wall motion abnormalities. The left ventricular internal cavity  size was normal in size. There is   no left ventricular hypertrophy. Left ventricular diastolic parameters  are consistent with Grade I diastolic dysfunction (impaired relaxation).   Right Ventricle: The right ventricular size is normal. No increase in  right ventricular wall thickness. Right ventricular systolic function is  normal. There is mildly elevated pulmonary artery systolic pressure. The  tricuspid regurgitant velocity is 2.78   m/s, and with an assumed right atrial pressure of 8 mmHg, the estimated  right ventricular systolic pressure is 38.9 mmHg.   Left Atrium: Left atrial size was mild to moderately dilated.   Right Atrium: Right atrial size was normal in size.   Pericardium: There is no evidence of pericardial effusion.   Mitral Valve: The mitral valve is  degenerative in appearance. There is  moderate calcification of the mitral valve leaflet(s). Moderate to severe  mitral annular calcification. Trivial mitral valve regurgitation. Mild  mitral valve stenosis. MV peak  gradient, 10.2 mmHg. The mean mitral valve gradient is 3.0 mmHg.   Tricuspid Valve: The tricuspid valve is normal in structure. Tricuspid  valve regurgitation is trivial.   Aortic Valve: The aortic valve is tricuspid. There is severe calcifcation  of the aortic valve. Aortic valve regurgitation is mild to moderate.  Aortic regurgitation PHT measures 318 msec. Moderate to severe aortic  stenosis is present. Aortic valve mean  gradient measures 18.0 mmHg. Aortic valve peak gradient measures 29.3  mmHg. Aortic valve area, by VTI measures 0.83 cm.   Pulmonic Valve: The pulmonic valve was normal in structure. Pulmonic valve  regurgitation is not visualized.   Aorta: The aortic root is normal in size and structure.   Venous: The inferior vena cava is normal in size with less than 50%  respiratory variability, suggesting right atrial pressure of 8 mmHg.   IAS/Shunts: No atrial level shunt detected by color flow Doppler.     LEFT VENTRICLE  PLAX 2D  LVIDd:         3.80 cm   Diastology  LVIDs:         2.50 cm   LV e' medial:    6.31 cm/s  LV PW:         1.05 cm   LV E/e' medial:  19.3  LV IVS:        0.95 cm  LV e' lateral:   6.74 cm/s  LVOT diam:     1.95 cm   LV E/e' lateral: 18.0  LV SV:         48  LV SV Index:   37  LVOT Area:     2.99 cm                             3D Volume EF:                           3D EF:        62 %                           LV EDV:       120 ml                           LV ESV:       46 ml                           LV SV:        74 ml   RIGHT VENTRICLE  RV Basal diam:  3.10 cm  RV S prime:     12.30 cm/s  TAPSE (M-mode): 2.0 cm  RVSP:           33.9 mmHg   LEFT ATRIUM             Index        RIGHT ATRIUM           Index  LA  diam:        3.30 cm 2.52 cm/m   RA Pressure: 3.00 mmHg  LA Vol (A2C):   53.4 ml 40.84 ml/m  RA Area:     10.60 cm  LA Vol (A4C):   46.3 ml 35.41 ml/m  RA Volume:   25.50 ml  19.50 ml/m  LA Biplane Vol: 54.2 ml 41.45 ml/m   AORTIC VALVE  AV Area (Vmax):    0.81 cm  AV Area (Vmean):   0.75 cm  AV Area (VTI):     0.83 cm  AV Vmax:           270.50 cm/s  AV Vmean:          188.000 cm/s  AV VTI:            0.572 m  AV Peak Grad:      29.3 mmHg  AV Mean Grad:      18.0 mmHg  LVOT Vmax:         72.93 cm/s  LVOT Vmean:        46.933 cm/s  LVOT VTI:          0.160 m  LVOT/AV VTI ratio: 0.28  AI PHT:            318 msec    AORTA  Ao Root diam: 2.60 cm  Ao Asc diam:  2.80 cm   MITRAL VALVE                TRICUSPID VALVE  MV Area (PHT)  cm          TR Peak grad:   30.9 mmHg  MV Area VTI:   1.15 cm  TR Vmax:        278.00 cm/s  MV Peak grad:  10.2 mmHg    Estimated RAP:  3.00 mmHg  MV Mean grad:  3.0 mmHg     RVSP:           33.9 mmHg  MV Vmax:       1.60 m/s  MV Vmean:      81.1 cm/s    SHUNTS  MV Decel Time: 230 msec     Systemic VTI:  0.16 m  MR Peak grad: 165.9 mmHg    Systemic Diam: 1.95 cm  MR Mean grad: 113.0 mmHg  MR Vmax:      644.00 cm/s  MR Vmean:     506.0 cm/s  MV E velocity: 121.50 cm/s  MV A velocity: 149.50 cm/s  MV E/A ratio:  0.81   Dalton McleanMD  Electronically signed by Ezra Kanner  Signature Date/Time: 03/12/2024/8:12:20 PM        Final     rocedures  RIGHT HEART CATH AND CORONARY ANGIOGRAPHY   Conclusion      Mid Cx to Dist Cx lesion is 75% stenosed.   Mid Cx lesion is 30% stenosed.   1st Diag lesion is 40% stenosed.   2nd Mrg lesion is 40% stenosed.   RPDA lesion is 80% stenosed.   1.  Mild to moderate nonobstructive coronary artery disease relatively unchanged versus previous study from 2023. 2.  Fick cardiac output of 4.7 L/min and Fick cardiac index of 3.6 L/min/m with the following hemodynamics:            Right atrial  pressure mean of 6 mmHg            Right ventricular pressure 35/1 with an end-diastolic pressure of 9 mmHg            Wedge pressure mean of 17 mmHg with V waves to 21 mmHg            PA pressure of 33/16 with a mean of 23 mmHg            PVR of 1.3 Woods units            PA pulsatility index of 2.8 3.  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  If a TAVR is to be pursued, the right femoral artery (with radial secondary access site) appears feasible.   Recommendation: Continue evaluation for potential aortic valve intervention.   Procedural Details  Technical Details The patient is a 68 year old female with a history of paradoxical low-flow low gradient aortic stenosis, mild to moderate coronary artery disease on previous cardiac catheterization treated medically, hyperlipidemia, aortic atherosclerosis, peripheral vascular disease with history of mesenteric ischemia status post SMA stenting and known left common iliac occlusion was seen in the outpatient setting due to worsening dyspnea.  She is referred for right heart catheterization and coronary angiography as part of an evaluation for potential aortic valve intervention.  After obtaining consent, the patient brought to the cardiac catheterization laboratory and prepped draped sterile fashion.  Xylocaine  was used to anesthetize the right wrist and a 6 French Terumo glide sheath was placed.  3000 units of heparin  and 5 mg of Rappold were administered through the sheath.  Previously placed antecubital IV was exchanged for a 5 French femoral glide sheath.  A 6 [place] TIG catheter was used for selective coronary angiography.  A 5 [place] balloontipped catheter  was used for right heart catheterization.  A 5 French pigtail catheter was maneuvered to the distal abdominal aorta and abdominal aortography and bilateral iliofemoral angiography was performed.  After review of the  angiographic images and hemodynamic data, no further interventions were pursued.  A TR band was placed and manual pressure applied to the antecubital site.  There were no acute complications. Estimated blood loss <50 mL.   During this procedure medications were administered to achieve and maintain moderate conscious sedation while the patient's heart rate, blood pressure, and oxygen saturation were continuously monitored and I was present face-to-face 100% of this time. Jasmine Pearce Cardiovascular Specialist and [facility] Rad Tech are independent, trained observers who assisted in the monitoring of the patient's level of consciousness.   Medications (Filter: Administrations occurring from [phone] to 0943 on 04/09/24) lidocaine  (PF) (XYLOCAINE ) 1 % injection (mL)  Total volume: 4 mL Date/Time Rate/Dose/Volume Action   04/09/24 0854 2 mL Given   0855 2 mL Given   heparin  sodium (porcine) injection (Units)  Total dose: 3,000 Units Date/Time Rate/Dose/Volume Action   04/09/24 0856 3,000 Units Given   Radial Cocktail/Verapamil  only (mL)  Total volume: 10 mL Date/Time Rate/Dose/Volume Action   04/09/24 0856 10 mL Given   Heparin  (Porcine) in NaCl 1000-0.9 UT/500ML-% SOLN (mL)  Total volume: 1,000 mL Date/Time Rate/Dose/Volume Action   04/09/24 0856 1,000 mL Given   midazolam  (VERSED ) injection (mg)  Total dose: 1 mg Date/Time Rate/Dose/Volume Action   04/09/24 0854 1 mg Given   fentaNYL  (SUBLIMAZE ) injection (mcg)  Total dose: 25 mcg Date/Time Rate/Dose/Volume Action   04/09/24 0854 25 mcg Given   iohexol  (OMNIPAQUE ) 350 MG/ML injection (mL)  Total volume: 55 mL Date/Time Rate/Dose/Volume Action   04/09/24 0916 55 mL Given    Sedation Time  Sedation Time Physician-1: 21 minutes 6 seconds Contrast     Administrations occurring from 0836 to 0943 on 04/09/24:  Medication Name Total Dose  iohexol  (OMNIPAQUE ) 350 MG/ML injection 55 mL   Radiation/Fluoro  Fluoro time: 5.7  (min) DAP: 4227 (mGycm2) Cumulative Air Kerma: 51 (mGy) Complications  Complications documented before study signed (04/09/2024  9:50 AM)   No complications were associated with this study.  Documented by Jaynee Allena CROME, RT - 04/09/2024  8:32 AM     Coronary Findings  Diagnostic Dominance: Right Left Anterior Descending  The vessel exhibits minimal luminal irregularities.    First Diagonal Branch  1st Diag lesion is 40% stenosed.    Second Diagonal Branch  Vessel is small in size.    Left Circumflex  The vessel exhibits minimal luminal irregularities.  Mid Cx lesion is 30% stenosed.  Mid Cx to Dist Cx lesion is 75% stenosed.    First Obtuse Marginal Branch  Vessel is small in size.    Second Obtuse Marginal Branch  2nd Mrg lesion is 40% stenosed.    Right Coronary Artery  There is moderate diffuse disease throughout the vessel.    Right Posterior Descending Artery  Vessel is small in size.  RPDA lesion is 80% stenosed.    First Right Posterolateral Branch  Vessel is large in size.    Intervention   No interventions have been documented.   Coronary Diagrams  Diagnostic Dominance: Right  Intervention   Implants   No implant documentation for this case.   Syngo Images   Show images for CARDIAC CATHETERIZATION Images on Long Term Storage   Show images for Amea, Mcphail to Procedure Log  Procedure Log    Hemodynamics  Pressures Phases Resting  Right     RA Mean  mmHg 6    RA A-Wave  mmHg 12    RA V-Wave  mmHg 8  Pulmonary     PA  mmHg 33/16 (23)    PCW Mean  mmHg 17.0    PCW A-Wave  mmHg 19.0    PCW V-Wave  mmHg 21.0    PAPi   2.8    Saturations Phases Resting    PA  % 68    Arterial  % 95    Hemo Data  Flowsheet Row Most Recent Value  Fick Cardiac Output 4.73 L/min  Fick Cardiac Output Index 3.58 (L/min)/BSA  RA A Wave 12 mmHg  RA V Wave 8 mmHg  RA Mean 6 mmHg  RV Systolic Pressure 35 mmHg  RV Diastolic Pressure 1  mmHg  RV EDP 9 mmHg  PA Systolic Pressure 33 mmHg  PA Diastolic Pressure 16 mmHg  PA Mean 23 mmHg  PW A Wave 19 mmHg  PW V Wave 21 mmHg  PW Mean 17 mmHg  AO Systolic Pressure 178 mmHg  AO Diastolic Pressure 56 mmHg  AO Mean 99 mmHg  QP/QS 0.96  TPVR Index 6.67 HRUI  TSVR Index 26.52 HRUI  PVR SVR Ratio 0.07  TPVR/TSVR Ratio 0.25   Narrative & Impression  CLINICAL DATA:  Aortic valve replacement (TAVR), pre-op eval   EXAM: Cardiac TAVR CT   TECHNIQUE: A non-contrast, gated CT scan was obtained with axial slices of 2.5 mm through the heart for aortic valve scoring. A 120 kV retrospective, gated, contrast cardiac scan was obtained. Gantry rotation speed was 230 msec and collimation was 0.63 mm. Nitroglycerin  was not given. A delayed scan was obtained to exclude left atrial appendage thrombus. The 3D dataset was reconstructed in systole with motion correction. The 3D data set was reconstructed in 5% intervals of the 0-95% of the R-R cycle. Systolic and diastolic phases were analyzed on a dedicated workstation using MPR, MIP, and VRT modes. The patient received 100 cc of contrast.   FINDINGS: Aortic Valve:   Tricuspid aortic valve with moderately reduced cusp excursion. Severely thickened cusps along coaptation surface, which appears to cause leaflet retraction and lack of central coaptation. 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 Severely calcified aortic valve cusps.   AV calcium  score: 1238   Virtual Basal Annulus Measurements:   Maximum/Minimum Diameter: 25.6 x 22.5 mm   Perimeter: 74.7 mm   Area:  435 mm2   Focal LVOT calcification below left coronary cusp.   Membranous septal length: 9.3 mm   Based on these measurements, the annulus would be suitable for a 26 mm Sapien 3 valve. Alternatively, Heart Team can consider 29 mm Evolut valve. Recommend Heart Team discussion for valve selection.   Sinus of  Valsalva Measurements:   Non-coronary:  32 mm   Right - coronary:  31 mm   Left - coronary:  31 mm   Coronary height and sinus of Valsalva Height:   Left main: 17.6 mm, Left sinus: 23.5 mm   Right coronary: 18.1 mm, Right sinus: 22.7 mm   Aorta: Conventional 3 vessel branch pattern of aortic arch. Severe aortic atherosclerosis.   Sinotubular Junction:  32 mm   Ascending Thoracic Aorta:  36 mm   Aortic Arch:  27 mm   Descending Thoracic Aorta:  28 mm   Coronary Arteries: Normal coronary origin. Right dominance.  The study was performed without use of NTG and insufficient for plaque evaluation. Coronary artery calcium  seen in 3 vessel distribution.   Optimum Fluoroscopic Angle for Delivery: LAO 9 CAU 18   OTHER:   Atria: LA dilation   Left atrial appendage: No thrombus.   Mitral valve: Abnormal, severe mitral annular calcifications.   Pulmonary artery: Normal caliber.   Pulmonary veins: Normal anatomy.   IMPRESSION: 1. Tricuspid aortic valve with moderately reduced cusp excursion. Severely thickened cusps along coaptation surface, which appears to cause leaflet retraction and lack of central coaptation. 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 Severely calcified aortic valve cusps. 2. Aortic valve calcium  score: 1238 3. Annulus area: 435 mm2, suitable for 26 mm Sapien 3 valve. Focal LVOT calcifications. Membranous septal length 9.3 mm. 4. Sufficient coronary artery heights from annulus. 5. Optimum fluoroscopic angle for delivery: LAO 9 CAU 18   Electronically Signed: By: Soyla Merck M.D. On: 04/22/2024 13:34     Narrative & Impression  CLINICAL DATA:  Aortic valve replacement, preoperative evaluation   EXAM: CTA ABDOMEN AND PELVIS WITHOUT AND WITH CONTRAST   TECHNIQUE: Multidetector CT imaging of the abdomen and pelvis was performed using the standard protocol during bolus administration  of intravenous contrast. Multiplanar reconstructed images and MIPs were obtained and reviewed to evaluate the vascular anatomy.   RADIATION DOSE REDUCTION: This exam was performed according to the departmental dose-optimization program which includes automated exposure control, adjustment of the mA and/or kV according to patient size and/or use of iterative reconstruction technique.   CONTRAST:  OMNIPAQUE  IOHEXOL  350 MG/ML SOLN   COMPARISON:  CT of the abdomen and pelvis performed July 19, 2023   FINDINGS: VASCULAR   Aorta: Abdominal aortic aneurysm with moderate volume of wall adherent thrombus/plaque. The infrarenal segment measures 3.1 x 3.1 cm.   Celiac: Downward displacement with moderate stenosis in the proximal segment.   SMA: Stent material is present within the SMA which is patent. Mild atherosclerotic changes are present in the mid segment.   Renals: Multiple left renal arteries. The dominant left renal artery is patent. A left lower pole renal artery is patent but demonstrates a severe stenosis in the proximal segment. Moderate stenosis in the right main renal artery proximally.   IMA: Likely occluded at the origin with proximal segment reconstitution via collaterals.   Inflow: The right common iliac artery is ectatic with mild atherosclerotic changes. Minimal diameter is estimated at 12 mm. Right internal iliac artery is patent. Right external iliac artery demonstrates mild atherosclerotic changes with minimal diameter estimated at 4 mm which is profiled on image 189 of CT series 310.   The left common iliac artery is occluded. The proximal left internal iliac artery is occluded. The left external iliac artery is occluded.   Proximal Outflow: The right common femoral artery is patent with minimal diameter estimated at 4.5 mm. The left common femoral artery is reconstituted via collaterals with minimal diameter estimated at 2.5 mm.   Veins: No  obvious venous abnormality within the limitations of this arterial phase study.   Review of the MIP images confirms the above findings.   NON-VASCULAR   Lower chest: Reported separately.   Hepatobiliary: No focal liver abnormality is seen. No gallstones, gallbladder wall thickening, or biliary dilatation.   Pancreas: Unremarkable. No pancreatic ductal dilatation or surrounding inflammatory changes.   Spleen: Normal in size without focal abnormality.   Adrenals/Urinary Tract: No hydronephrosis. Cortical thinning is present  in the left kidney. A small left renal cyst is present estimated at 8 mm.   Stomach/Bowel: No dilated loops of bowel are seen.   Lymphatic: No significant lymphadenopathy.   Reproductive: Uterus and bilateral adnexa are unremarkable.   Other: Nothing significant.   Musculoskeletal: No acute or significant osseous findings.   IMPRESSION: 1. Minimal access vessel diameters detailed above. 2. 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 3. Abdominal aortic aneurysm measuring 3.1 cm. 4. Patent SMA stent. 5. 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     Electronically Signed   By: Maude Naegeli M.D.   On: 04/21/2024 06:56      Impression:  This 68 year old woman has stage D, severe, symptomatic, paradoxical low-flow/low gradient aortic stenosis with NYHA class II symptoms of exertional fatigue and shortness of breath consistent with chronic diastolic congestive heart failure.  I have personally reviewed her 2D echocardiogram, cardiac catheterization, and CTA studies.  Her echocardiogram shows a severely calcified and thickened aortic valve with restricted leaflet mobility.  The mean gradient across the valve is only 18 mmHg but the valve area by VTI is 0.83 cm with a low stroke-volume index of 37 and a normal left ventricular ejection fraction of 55 to  60%.  Cardiac catheterization shows a 75% mid left circumflex stenosis and 80% stenosis in the posterior descending branch which has not changed significantly since 2023.  She has no anginal symptoms and I think this can be treated medically.  I agree that aortic valve replacement is indicated in this patient for relief of her symptoms and to prevent further left ventricular dysfunction.  In addition she also has an occluded left common iliac artery and may need left lower extremity revascularization in the near future.  Her gated cardiac CTA shows anatomy suitable for TAVR using a 26 mm SAPIEN 3 valve.  Her abdominal and pelvic CTA appears to show adequate transfemoral access on the right side.  Her left radial artery can be used for secondary access for the pigtail catheter.  She does appear to have adequate access measurements for the left subclavian and left carotid arteries if needed.  The patient was counseled at length regarding treatment alternatives for management of severe symptomatic aortic stenosis. The risks and benefits of surgical intervention has been discussed in detail. Long-term prognosis with medical therapy was discussed. Alternative approaches such as conventional surgical aortic valve replacement, transcatheter aortic valve replacement, and palliative medical therapy were compared and contrasted at length. This discussion was placed in the context of the patient's own specific clinical presentation and past medical history. All of her questions have been addressed.   Following the decision to proceed with transcatheter aortic valve replacement, a discussion was held regarding what types of management strategies would be attempted intraoperatively in the event of life-threatening complications, including whether or not the patient would be considered a candidate for the use of cardiopulmonary bypass and/or conversion to open sternotomy for attempted surgical intervention.  I do not think  she is a candidate for emergent sternotomy to manage any intraoperative complications due to the combination of her age, frailty with a BMI of 16, and diffuse vascular disease with ongoing smoking and likely severe COPD based on the appearance of her CT scan of the chest.  The patient has been advised of a variety of complications that might develop including but not limited to risks of death,  stroke, paravalvular leak, aortic dissection or other major vascular complications, aortic annulus rupture, device embolization, cardiac rupture or perforation, mitral regurgitation, acute myocardial infarction, arrhythmia, heart block or bradycardia requiring permanent pacemaker placement, congestive heart failure, respiratory failure, renal failure, pneumonia, infection, other late complications related to structural valve deterioration or migration, or other complications that might ultimately cause a temporary or permanent loss of functional independence or other long term morbidity. The patient provides full informed consent for the procedure as described and all questions were answered.      Plan:  She will be scheduled for right transfemoral TAVR using a SAPIEN 3 valve on 05/22/2024.  I spent 60 minutes performing this consultation and > 50% of this time was spent face to face counseling and coordinating the care of this patient's severe symptomatic aortic stenosis.   Dorise LOIS Fellers, MD 05/02/2024

## 2024-05-08 ENCOUNTER — Other Ambulatory Visit (HOSPITAL_COMMUNITY): Payer: Self-pay

## 2024-05-10 ENCOUNTER — Other Ambulatory Visit: Payer: Self-pay

## 2024-05-10 DIAGNOSIS — I350 Nonrheumatic aortic (valve) stenosis: Secondary | ICD-10-CM

## 2024-05-10 NOTE — Progress Notes (Unsigned)
 Patient ID: Christie Moreno, female   DOB: February 04, 1956, 68 y.o.   MRN: [id number]  Reason for Consult: No chief complaint on file.   Referred by Wallace Joesph LABOR, PA  Subjective:     HPI Christie Moreno is a 68 y.o. female presenting for follow-up.  She underwent SMA intravascular lithotripsy and stenting on 07/25/2023.  She also has significant PAD with a chronically occluded left iliac system.  She has been scheduled for a TAVR via a right femoral access on 05/22/2024. ***   Past Medical History:  Diagnosis Date   Complex partial seizure (HCC) 04/22/2015   Convulsions (HCC) 04/19/2013   Heart failure (HCC)    Hyperlipidemia    Hypertension    Seizures (HCC)    none in years, recently had medication change   Thyroid disease    Family History  Adopted: Yes  Problem Relation Age of Onset   Breast cancer Mother    Diabetes Mother    Stroke Mother    Heart Problems Father    Diabetes Sister    Kidney failure Sister    Breast cancer Maternal Aunt    Lung cancer Half-Sister    Colon cancer Neg Hx    Esophageal cancer Neg Hx    Epilepsy Neg Hx    Stomach cancer Neg Hx    Rectal cancer Neg Hx    Past Surgical History:  Procedure Laterality Date   COLONOSCOPY     PERIPHERAL INTRAVASCULAR LITHOTRIPSY  07/25/2023   Procedure: PERIPHERAL INTRAVASCULAR LITHOTRIPSY;  Surgeon: Pearline Norman RAMAN, MD;  Location: [facility] INVASIVE CV LAB;  Service: Cardiovascular;;   PERIPHERAL VASCULAR INTERVENTION  07/25/2023   Procedure: PERIPHERAL VASCULAR INTERVENTION;  Surgeon: Pearline Norman RAMAN, MD;  Location: [facility] INVASIVE CV LAB;  Service: Cardiovascular;;   RIGHT HEART CATH AND CORONARY ANGIOGRAPHY N/A 04/09/2024   Procedure: RIGHT HEART CATH AND CORONARY ANGIOGRAPHY;  Surgeon: Wendel Lurena POUR, MD;  Location: [facility] INVASIVE CV LAB;  Service: Cardiovascular;  Laterality: N/A;   RIGHT/LEFT HEART CATH AND CORONARY ANGIOGRAPHY N/A 11/05/2021   Procedure: RIGHT/LEFT HEART CATH AND CORONARY  ANGIOGRAPHY;  Surgeon: Claudene Victory ORN, MD;  Location: [facility] INVASIVE CV LAB;  Service: Cardiovascular;  Laterality: N/A;   VISCERAL ANGIOGRAPHY N/A 07/25/2023   Procedure: VISCERAL ANGIOGRAPHY;  Surgeon: Pearline Norman RAMAN, MD;  Location: [facility] INVASIVE CV LAB;  Service: Cardiovascular;  Laterality: N/A;    Short Social History:  Social History   Tobacco Use   Smoking status: Some Days    Current packs/day: 0.25    Average packs/day: 1 pack/day for 52.1 years (52.0 ttl pk-yrs)    Types: Cigarettes    Start date: 11/09/1969    Last attempt to quit: 11/09/2021    Passive exposure: Current   Smokeless tobacco: Never   Tobacco comments:    08/31/2023 Patient smoke about 3 cigarettes daily    Currently 1 pack every 2-3 days    04/06/2022 patient states when she smokes its 1 cigarette    04/05/2023 Patient smokes 2 cigarettes some days   Substance Use Topics   Alcohol use: Not Currently    Comment: socially    Allergies  Allergen Reactions   Jardiance  [Empagliflozin ]     UTIs   Azithromycin Nausea And Vomiting   Penicillins Itching and Rash   Percocet [Oxycodone-Acetaminophen ] Rash    Current Outpatient Medications  Medication Sig Dispense Refill   albuterol  (PROVENTIL ) (2.5 MG/3ML) 0.083% nebulizer solution Take 3 mLs (2.5 mg total) by  nebulization every 6 (six) hours as needed for wheezing or shortness of breath. 90 mL 12   albuterol  (VENTOLIN  HFA) 108 (90 Base) MCG/ACT inhaler Inhale 2 puffs into the lungs every 4 (four) hours as needed for wheezing or shortness of breath. 3 each 3   aspirin  81 MG EC tablet Take 1 tablet (81 mg total) by mouth daily. Swallow whole. 30 tablet 0   atorvastatin  (LIPITOR ) 80 MG tablet Take 1 tablet (80 mg total) by mouth daily at 6 PM. 90 tablet 1   budeson-glycopyrrolate-formoterol (BREZTRI  AEROSPHERE) 160-9-4.8 MCG/ACT AERO inhaler Inhale 2 puffs into the lungs 2 (two) times daily. 10.7 g 11   carvedilol  (COREG ) 3.125 MG tablet TAKE 1 TABLET(3.125 MG) BY  MOUTH TWICE DAILY WITH A MEAL 180 tablet 2   clopidogrel  (PLAVIX ) 75 MG tablet Take 1 tablet (75 mg total) by mouth daily. 90 tablet 1   estrogens, conjugated, (PREMARIN) 1.25 MG tablet Take 1.25 mg by mouth daily.     feeding supplement (ENSURE ENLIVE / ENSURE PLUS) LIQD Take 237 mLs by mouth 2 (two) times daily between meals. 237 mL 0   furosemide  (LASIX ) 40 MG tablet Take 0.5 tablets (20 mg total) by mouth daily. TAKE ONE-HALF (1/2) TABLET (20 MG TOTAL) DAILY 45 tablet 1   hydrOXYzine  (ATARAX ) 25 MG tablet Take 25 mg by mouth every 8 (eight) hours as needed for itching. (Patient taking differently: Take 25 mg by mouth as needed for itching.)     lamoTRIgine  (LAMICTAL ) 100 MG tablet Take 1 tablet (100 mg total) by mouth 2 (two) times daily. 180 tablet 3   levothyroxine  (SYNTHROID ) 25 MCG tablet Take 1 tablet (25 mcg total) by mouth daily before breakfast. 90 tablet 3   naproxen sodium (ALEVE) 220 MG tablet Take 220 mg by mouth daily as needed (Pain).     nicotine  polacrilex (NICORETTE ) 2 MG gum Take 1 each (2 mg total) by mouth as needed for smoking cessation. (Patient not taking: Reported on 04/23/2024) 100 tablet 1   potassium chloride  SA (KLOR-CON  M) 20 MEQ tablet TAKE 1 TABLET DAILY (KEEP UPCOMING APPOINTMENT FOR FUTURE REFILLS) 90 tablet 1   progesterone (PROMETRIUM) 200 MG capsule Take 200 mg by mouth at bedtime.     Wheat Dextrin (BENEFIBER PO) Take 1-2 Doses by mouth daily.     No current facility-administered medications for this visit.    REVIEW OF SYSTEMS  All other systems were reviewed and are negative     Objective:  Objective   There were no vitals filed for this visit.  There is no height or weight on file to calculate BMI.  Physical Exam General: no acute distress Cardiac: hemodynamically stable Pulm: normal work of breathing Extremities: ***      Assessment/Plan:   Christie Moreno is a 68 y.o. 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  She continues to be asymptomatic from a mesenteric standpoint and can eat without any pain or discomfort. From a PAD standpoint she denies rest pain and does not have any wounds and is planning to undergo TAVR on October 21.   Will plan for follow-up in 3 months to rediscuss her symptoms, at this time she should also be well recovered from TAVR. ***  Norman GORMAN Serve MD [facility]

## 2024-05-11 ENCOUNTER — Encounter: Payer: Self-pay | Admitting: Vascular Surgery

## 2024-05-11 ENCOUNTER — Ambulatory Visit (HOSPITAL_COMMUNITY)
Admission: RE | Admit: 2024-05-11 | Discharge: 2024-05-11 | Disposition: A | Discharge status: Home / Self Care | Source: Ambulatory Visit | Attending: Vascular Surgery | Admitting: Vascular Surgery

## 2024-05-11 ENCOUNTER — Ambulatory Visit (INDEPENDENT_AMBULATORY_CARE_PROVIDER_SITE_OTHER): Admitting: Vascular Surgery

## 2024-05-11 VITALS — BP 149/62 | HR 62 | Temp 98.2°F | Resp 20 | Ht 61.0 in | Wt 84.2 lb

## 2024-05-11 DIAGNOSIS — K551 Chronic vascular disorders of intestine: Secondary | ICD-10-CM

## 2024-05-11 DIAGNOSIS — I739 Peripheral vascular disease, unspecified: Secondary | ICD-10-CM | POA: Diagnosis not present

## 2024-05-11 LAB — VAS US ABI WITH/WO TBI
Left ABI: 0.46
Right ABI: 1

## 2024-05-14 ENCOUNTER — Telehealth: Payer: Self-pay | Admitting: Cardiovascular Disease

## 2024-05-14 ENCOUNTER — Other Ambulatory Visit: Payer: Self-pay

## 2024-05-14 DIAGNOSIS — I739 Peripheral vascular disease, unspecified: Secondary | ICD-10-CM

## 2024-05-14 DIAGNOSIS — K551 Chronic vascular disorders of intestine: Secondary | ICD-10-CM

## 2024-05-14 NOTE — Telephone Encounter (Signed)
 Spoke with pt and advised there was no reason for that appointment if she was feeling better. Pt stated understanding.

## 2024-05-14 NOTE — Telephone Encounter (Signed)
 Pt cancelled 10/15 appt with Court because her swelling has resolved. She would like a callback from the nurse to discuss if a reschedule is needed. Please advise.

## 2024-05-16 ENCOUNTER — Ambulatory Visit: Admitting: Cardiovascular Disease

## 2024-05-18 ENCOUNTER — Ambulatory Visit (HOSPITAL_COMMUNITY)
Admission: RE | Admit: 2024-05-18 | Discharge: 2024-05-18 | Disposition: A | Discharge status: Home / Self Care | Source: Ambulatory Visit | Attending: Internal Medicine | Admitting: Internal Medicine

## 2024-05-18 ENCOUNTER — Encounter (HOSPITAL_COMMUNITY)
Admission: RE | Admit: 2024-05-18 | Discharge: 2024-05-18 | Disposition: A | Source: Ambulatory Visit | Attending: Internal Medicine

## 2024-05-18 ENCOUNTER — Other Ambulatory Visit: Payer: Self-pay

## 2024-05-18 DIAGNOSIS — J439 Emphysema, unspecified: Secondary | ICD-10-CM | POA: Insufficient documentation

## 2024-05-18 DIAGNOSIS — I350 Nonrheumatic aortic (valve) stenosis: Secondary | ICD-10-CM | POA: Insufficient documentation

## 2024-05-18 DIAGNOSIS — Z01818 Encounter for other preprocedural examination: Secondary | ICD-10-CM | POA: Insufficient documentation

## 2024-05-18 DIAGNOSIS — R9431 Abnormal electrocardiogram [ECG] [EKG]: Secondary | ICD-10-CM | POA: Insufficient documentation

## 2024-05-18 LAB — CBC
HCT: 40.9 % (ref 36.0–46.0)
Hemoglobin: 12.1 g/dL (ref 12.0–15.0)
MCH: 22.1 pg — ABNORMAL LOW (ref 26.0–34.0)
MCHC: 29.6 g/dL — ABNORMAL LOW (ref 30.0–36.0)
MCV: 74.6 fL — ABNORMAL LOW (ref 80.0–100.0)
Platelets: 248 K/uL (ref 150–400)
RBC: 5.48 MIL/uL — ABNORMAL HIGH (ref 3.87–5.11)
RDW: 23.1 % — ABNORMAL HIGH (ref 11.5–15.5)
WBC: 6.2 K/uL (ref 4.0–10.5)
nRBC: 0 % (ref 0.0–0.2)

## 2024-05-18 LAB — PROTIME-INR
INR: 1 (ref 0.8–1.2)
Prothrombin Time: 13.2 s (ref 11.4–15.2)

## 2024-05-18 LAB — TYPE AND SCREEN
ABO/RH(D): A POS
Antibody Screen: NEGATIVE

## 2024-05-18 LAB — COMPREHENSIVE METABOLIC PANEL WITH GFR
ALT: 19 U/L (ref 0–44)
AST: 24 U/L (ref 15–41)
Albumin: 3.3 g/dL — ABNORMAL LOW (ref 3.5–5.0)
Alkaline Phosphatase: 58 U/L (ref 38–126)
Anion gap: 11 (ref 5–15)
BUN: 20 mg/dL (ref 8–23)
CO2: 25 mmol/L (ref 22–32)
Calcium: 8.7 mg/dL — ABNORMAL LOW (ref 8.9–10.3)
Chloride: 101 mmol/L (ref 98–111)
Creatinine, Ser: 0.93 mg/dL (ref 0.44–1.00)
GFR, Estimated: >60 mL/min (ref >60)
Glucose, Bld: 65 mg/dL — ABNORMAL LOW (ref 70–99)
Potassium: 3.7 mmol/L (ref 3.5–5.1)
Sodium: 137 mmol/L (ref 135–145)
Total Bilirubin: 0.3 mg/dL (ref 0.0–1.2)
Total Protein: 6.5 g/dL (ref 6.5–8.1)

## 2024-05-18 LAB — URINALYSIS, ROUTINE W REFLEX MICROSCOPIC
Bacteria, UA: NONE SEEN
Bilirubin Urine: NEGATIVE
Glucose, UA: NEGATIVE mg/dL
Ketones, ur: NEGATIVE mg/dL
Leukocytes,Ua: NEGATIVE
Nitrite: NEGATIVE
Protein, ur: NEGATIVE mg/dL
Specific Gravity, Urine: 1.003 — ABNORMAL LOW (ref 1.005–1.030)
pH: 7 (ref 5.0–8.0)

## 2024-05-18 LAB — SURGICAL PCR SCREEN
MRSA, PCR: NEGATIVE
Staphylococcus aureus: NEGATIVE

## 2024-05-18 NOTE — Progress Notes (Signed)
 All consents signed by patient at PAT lab appointment. Pt was sent home with printed copy of surgical instructions and CHG soap/CHG soap instructions. All instructions reviewed with patient and questions answered.  Patients chart send to anesthesia for review. Pt denies any respiratory illness/infection in the last two months.

## 2024-05-21 MED ORDER — NOREPINEPHRINE 4 MG/250ML-% IV SOLN
0.0000 ug/min | INTRAVENOUS | Status: AC
Start: 2024-05-22 — End: 2024-05-23
  Administered 2024-05-22: 2 ug/min via INTRAVENOUS
  Filled 2024-05-21: qty 250

## 2024-05-21 MED ORDER — HEPARIN 30,000 UNITS/1000 ML (OHS) CELLSAVER SOLUTION
Status: DC
  Filled 2024-05-21: qty 1000

## 2024-05-21 MED ORDER — DEXMEDETOMIDINE HCL IN NACL 400 MCG/100ML IV SOLN
0.1000 ug/kg/h | INTRAVENOUS | Status: AC
Start: 2024-05-22 — End: 2024-05-23
  Administered 2024-05-22: 1 ug/kg/h via INTRAVENOUS
  Administered 2024-05-22: 19.12 ug via INTRAVENOUS
  Filled 2024-05-21: qty 100

## 2024-05-21 MED ORDER — CEFAZOLIN SODIUM-DEXTROSE 2-4 GM/100ML-% IV SOLN
2.0000 g | INTRAVENOUS | Status: AC
  Administered 2024-05-22: 2 g via INTRAVENOUS
  Filled 2024-05-21: qty 100

## 2024-05-21 MED ORDER — POTASSIUM CHLORIDE 2 MEQ/ML IV SOLN
80.0000 meq | INTRAVENOUS | Status: DC
  Filled 2024-05-21: qty 40

## 2024-05-21 MED ORDER — MAGNESIUM SULFATE 50 % IJ SOLN
40.0000 meq | INTRAMUSCULAR | Status: DC
Start: 2024-05-22 — End: 2024-05-23
  Filled 2024-05-21: qty 9.85

## 2024-05-21 NOTE — H&P (Signed)
 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  Echocardiogram in April 2023 showed an ejection fraction of 40 to 45% with a calcified aortic valve with a low mean gradient of 3 mmHg and a valve area by VTI of 2.0 cm.  Cardiac catheterization in April 2023 showed moderate stenoses in multiple coronary branches and GDMT was recommended.  In August 2023 an echocardiogram showed an improvement in ejection fraction to 50 to 55% with an increase in the aortic valve mean gradient to 12 mmHg and a valve area of 1.03 cm.  Her most recent echocardiogram on 03/12/2024 showed a mean aortic valve gradient of 18 mmHg with a peak of 29.  Aortic valve area was 0.83 cm by VTI.  Stroke-volume index was 37.  Dimensionless index was 0.28.  There was mild to moderate aortic insufficiency with a pressure half-time of 318 ms.  She underwent repeat cardiac catheterization on 04/09/2024 showing mild to moderate nonobstructive coronary disease that was not significantly changed compared to the study in 2023 and continue medical treatment was recommended.   She reports occasional episodes of shortness of breath with activity.  She was able to walking from the front door today without difficulty.  She reports some wheezing but continues to smoke a few cigarettes  per day.  She denies any chest pain or pressure.  She has had no dizziness or syncope.  She does report some lower extremity edema particularly in the left leg.       Past Medical History:  Diagnosis Date   Complex partial seizure (HCC) 04/22/2015   Convulsions (HCC) 04/19/2013   Heart failure (HCC)     Hyperlipidemia     Hypertension     Seizures (HCC)      none in years, recently had medication change   Thyroid disease                 Past Surgical History:  Procedure Laterality Date   COLONOSCOPY       PERIPHERAL INTRAVASCULAR LITHOTRIPSY   07/25/2023    Procedure: PERIPHERAL INTRAVASCULAR LITHOTRIPSY;  Surgeon: Christie Norman RAMAN, MD;  Location: [facility] INVASIVE CV LAB;  Service: Cardiovascular;;   PERIPHERAL VASCULAR INTERVENTION   07/25/2023    Procedure: PERIPHERAL VASCULAR INTERVENTION;  Surgeon: Christie Norman RAMAN, MD;  Location: [facility] INVASIVE CV LAB;  Service: Cardiovascular;;   RIGHT HEART CATH AND CORONARY ANGIOGRAPHY N/A 04/09/2024    Procedure: RIGHT HEART CATH AND CORONARY ANGIOGRAPHY;  Surgeon: Moreno Christie POUR, MD;  Location: [facility] INVASIVE CV LAB;  Service: Cardiovascular;  Laterality: N/A;   RIGHT/LEFT HEART CATH AND CORONARY ANGIOGRAPHY N/A 11/05/2021    Procedure: RIGHT/LEFT HEART CATH AND CORONARY ANGIOGRAPHY;  Surgeon: Christie Victory ORN, MD;  Location: [facility] INVASIVE CV LAB;  Service: Cardiovascular;  Laterality: N/A;   VISCERAL  ANGIOGRAPHY N/A 07/25/2023    Procedure: VISCERAL ANGIOGRAPHY;  Surgeon: Christie Norman RAMAN, MD;  Location: [facility] INVASIVE CV LAB;  Service: Cardiovascular;  Laterality: N/A;               Family History  Adopted: Yes  Problem Relation Age of Onset   Breast cancer Mother     Diabetes Mother     Stroke Mother     Heart Problems Father     Diabetes Sister     Kidney failure Sister     Breast cancer Maternal Aunt     Lung cancer Half-Sister     Colon cancer Neg Hx     Esophageal cancer Neg Hx     Epilepsy Neg Hx     Stomach cancer Neg Hx      Rectal cancer Neg Hx            Social History         Socioeconomic History   Marital status: Significant Other      Spouse name: Christie Moreno   Number of children: 1   Years of education: Not on file   Highest education level: Bachelor's degree (e.g., BA, AB, BS)  Occupational History   Occupation: retired  Tobacco Use   Smoking status: Some Days      Current packs/day: 0.25      Average packs/day: 1 pack/day for 52.1 years (52.0 ttl pk-yrs)      Types: Cigarettes      Start date: 11/09/1969      Last attempt to quit: 11/09/2021      Passive exposure: Current   Smokeless tobacco: Never   Tobacco comments:      08/31/2023 Patient smoke about 3 cigarettes daily      Currently 1 pack every 2-3 days      04/06/2022 patient states when she smokes its 1 cigarette      04/05/2023 Patient smokes 2 cigarettes some days   Vaping Use   Vaping status: Never Used  Substance and Sexual Activity   Alcohol use: Not Currently      Comment: socially   Drug use: No   Sexual activity: Not on file  Other Topics Concern   Not on file  Social History Narrative    Pt lives with family     Pt doesn't work     Social Drivers of Acupuncturist Strain: Low Risk  (03/22/2024)    Overall Financial Resource Strain (CARDIA)     Difficulty of Paying Living Expenses: Not hard at all  Food Insecurity: No Food Insecurity (04/23/2024)    Hunger Vital Sign     Worried About Running Out of Food in the Last Year: Never true     Ran Out of Food in the Last Year: Never true  Transportation Needs: No Transportation Needs (04/23/2024)    PRAPARE - Therapist, art (Medical): No     Lack of Transportation (Non-Medical): No  Physical Activity: Inactive (03/22/2024)    Exercise Vital Sign     Days of Exercise per Week: 0 days     Minutes of Exercise per Session: 0 min  Stress: Stress Concern Present (03/22/2024)    [name] -  Occupational Stress Questionnaire     Feeling of Stress: Rather much  Social Connections: Moderately Isolated (03/22/2024)    Social Connection and Isolation Panel  Frequency of Communication with Friends and Family: More than three times a week     Frequency of Social Gatherings with Friends and Family: Once a week     Attends Religious Services: Never     Database administrator or Organizations: No     Attends Banker Meetings: Never     Marital Status: Living with partner  Intimate Partner Violence: Not At Risk (04/23/2024)    Humiliation, Afraid, Rape, and Kick questionnaire     Fear of Current or Ex-Partner: No     Emotionally Abused: No     Physically Abused: No     Sexually Abused: No             Prior to Admission medications   Medication Sig Start Date End Date Taking? Authorizing Provider  albuterol  (PROVENTIL ) (2.5 MG/3ML) 0.083% nebulizer solution Take 3 mLs (2.5 mg total) by nebulization every 6 (six) hours as needed for wheezing or shortness of breath. 11/09/23     Chandra Toribio POUR, MD  albuterol  (VENTOLIN  HFA) [phone] Base) MCG/ACT inhaler Inhale 2 puffs into the lungs every 4 (four) hours as needed for wheezing or shortness of breath. 12/20/23     Chandra Toribio POUR, MD  aspirin  81 MG EC tablet Take 1 tablet (81 mg total) by mouth daily. Swallow whole. 11/06/21     Arrien, Elidia Toribio, MD  atorvastatin  (LIPITOR ) 80 MG tablet Take 1 tablet (80 mg total) by mouth daily at 6 PM. 03/05/24     Court Christie PARAS, MD  budeson-glycopyrrolate-formoterol (BREZTRI  AEROSPHERE) 160-9-4.8 MCG/ACT AERO inhaler Inhale 2 puffs into the lungs 2 (two) times daily. 11/09/23     Chandra Toribio POUR, MD  carvedilol  (COREG ) 3.125 MG tablet TAKE 1 TABLET(3.125 MG) BY MOUTH TWICE DAILY WITH A MEAL 12/01/23     Court Christie PARAS, MD  clopidogrel  (PLAVIX ) 75 MG tablet Take 1 tablet (75 mg total) by mouth daily. 02/08/24     Chandra Toribio POUR, MD  estrogens, conjugated, (PREMARIN) 1.25 MG tablet Take 1.25  mg by mouth daily.       [provider]  feeding supplement (ENSURE ENLIVE / ENSURE PLUS) LIQD Take 237 mLs by mouth 2 (two) times daily between meals. 11/06/21     Arrien, Mauricio Daniel, MD  furosemide  (LASIX ) 40 MG tablet Take 0.5 tablets (20 mg total) by mouth daily. TAKE ONE-HALF (1/2) TABLET (20 MG TOTAL) DAILY 03/02/24     Court Christie PARAS, MD  hydrOXYzine  (ATARAX ) 25 MG tablet Take 25 mg by mouth every 8 (eight) hours as needed for itching. Patient taking differently: Take 25 mg by mouth as needed for itching.       [provider]  lamoTRIgine  (LAMICTAL ) 100 MG tablet Take 1 tablet (100 mg total) by mouth 2 (two) times daily. 04/19/24     Gayland Lauraine PARAS, NP  levothyroxine  (SYNTHROID ) 25 MCG tablet Take 1 tablet (25 mcg total) by mouth daily before breakfast. 06/24/23     Wallace Search A, PA  naproxen sodium (ALEVE) 220 MG tablet Take 220 mg by mouth daily as needed (Pain).       [provider]  nicotine  polacrilex (NICORETTE ) 2 MG gum Take 1 each (2 mg total) by mouth as needed for smoking cessation. Patient not taking: Reported on 04/23/2024 02/08/24     Chandra Toribio POUR, MD  potassium chloride  SA (KLOR-CON  M) 20 MEQ tablet TAKE 1 TABLET DAILY (KEEP UPCOMING APPOINTMENT FOR FUTURE REFILLS)  02/24/24     Court Christie PARAS, MD  progesterone (PROMETRIUM) 200 MG capsule Take 200 mg by mouth at bedtime. 04/20/22     [provider]  Wheat Dextrin (BENEFIBER PO) Take 1-2 Doses by mouth daily.       [provider]            Current Outpatient Medications  Medication Sig Dispense Refill   albuterol  (PROVENTIL ) (2.5 MG/3ML) 0.083% nebulizer solution Take 3 mLs (2.5 mg total) by nebulization every 6 (six) hours as needed for wheezing or shortness of breath. 90 mL 12   albuterol  (VENTOLIN  HFA) 108 (90 Base) MCG/ACT inhaler Inhale 2 puffs into the lungs every 4 (four) hours as needed for wheezing or shortness of breath. 3 each 3   aspirin  81 MG EC tablet  Take 1 tablet (81 mg total) by mouth daily. Swallow whole. 30 tablet 0   atorvastatin  (LIPITOR ) 80 MG tablet Take 1 tablet (80 mg total) by mouth daily at 6 PM. 90 tablet 1   budeson-glycopyrrolate-formoterol (BREZTRI  AEROSPHERE) 160-9-4.8 MCG/ACT AERO inhaler Inhale 2 puffs into the lungs 2 (two) times daily. 10.7 g 11   carvedilol  (COREG ) 3.125 MG tablet TAKE 1 TABLET(3.125 MG) BY MOUTH TWICE DAILY WITH A MEAL 180 tablet 2   clopidogrel  (PLAVIX ) 75 MG tablet Take 1 tablet (75 mg total) by mouth daily. 90 tablet 1   estrogens, conjugated, (PREMARIN) 1.25 MG tablet Take 1.25 mg by mouth daily.       feeding supplement (ENSURE ENLIVE / ENSURE PLUS) LIQD Take 237 mLs by mouth 2 (two) times daily between meals. 237 mL 0   furosemide  (LASIX ) 40 MG tablet Take 0.5 tablets (20 mg total) by mouth daily. TAKE ONE-HALF (1/2) TABLET (20 MG TOTAL) DAILY 45 tablet 1   hydrOXYzine  (ATARAX ) 25 MG tablet Take 25 mg by mouth every 8 (eight) hours as needed for itching. (Patient taking differently: Take 25 mg by mouth as needed for itching.)       lamoTRIgine  (LAMICTAL ) 100 MG tablet Take 1 tablet (100 mg total) by mouth 2 (two) times daily. 180 tablet 3   levothyroxine  (SYNTHROID ) 25 MCG tablet Take 1 tablet (25 mcg total) by mouth daily before breakfast. 90 tablet 3   naproxen sodium (ALEVE) 220 MG tablet Take 220 mg by mouth daily as needed (Pain).       nicotine  polacrilex (NICORETTE ) 2 MG gum Take 1 each (2 mg total) by mouth as needed for smoking cessation. (Patient not taking: Reported on 04/23/2024) 100 tablet 1   potassium chloride  SA (KLOR-CON  M) 20 MEQ tablet TAKE 1 TABLET DAILY (KEEP UPCOMING APPOINTMENT FOR FUTURE REFILLS) 90 tablet 1   progesterone (PROMETRIUM) 200 MG capsule Take 200 mg by mouth at bedtime.       Wheat Dextrin (BENEFIBER PO) Take 1-2 Doses by mouth daily.          No current facility-administered medications for this visit.        Allergies       Allergies  Allergen Reactions    Jardiance  [Empagliflozin ]        UTIs   Azithromycin Nausea And Vomiting   Penicillins Itching and Rash   Percocet [Oxycodone-Acetaminophen ] Rash            Review of Systems:               General:  normal appetite, + decreased energy, + weight gain, no weight loss, no fever             Cardiac:                       no chest pain with exertion, no chest pain at rest, +SOB with moderate exertion, no resting SOB, no PND, no orthopnea, no palpitations, no arrhythmia, no atrial fibrillation, + LE edema, no dizzy spells, no syncope             Respiratory:                 + exertional shortness of breath, no home oxygen, no productive cough, no dry cough, no bronchitis, + wheezing, no hemoptysis, no asthma, no pain with inspiration or cough, no sleep apnea, no CPAP at night             GI:                               no difficulty swallowing, no reflux, no frequent heartburn, no hiatal hernia, no abdominal pain, no constipation, no diarrhea, no hematochezia, no hematemesis, no melena             GU:                              no dysuria,  no frequency, no urinary tract infection, no hematuria, no kidney stones, no kidney disease             Vascular:                     + pain suggestive of claudication, no pain in feet, no leg cramps, no varicose veins, no DVT, no non-healing foot ulcer             Neuro:                         no stroke, no TIA's, no seizures, no headaches, no temporary blindness one eye,  no slurred speech, no peripheral neuropathy, no chronic pain, no instability of gait, no memory/cognitive dysfunction             Musculoskeletal:         no arthritis, no joint swelling, no myalgias, no difficulty walking, normal mobility              Skin:                            no rash, no itching, no skin infections, no pressure sores or ulcerations             Psych:                         no anxiety, no depression, no nervousness, no unusual recent  stress             Eyes:                           no blurry vision, no floaters, no recent vision changes, no glasses or contacts             ENT:  no hearing loss, no loose or painful teeth, + dentures             Hematologic:               + easy bruising, no abnormal bleeding, no clotting disorder, no frequent epistaxis             Endocrine:                   no diabetes, does not check CBG's at home                            Physical Exam:               BP (!) 150/76   Pulse 90   Resp 18   Ht 5' 1 (1.549 m)   Wt 83 lb (37.6 kg)   SpO2 96% Comment: RA  BMI 15.68 kg/m              General:                      Thin, frail-appearing             HEENT:                       Unremarkable, NCAT, PERLA, EOMI             Neck:                           no JVD, no bruits, no adenopathy              Chest:                          clear to auscultation, symmetrical breath sounds, no wheezes, no rhonchi              CV:                              RRR, 3/6 systolic murmur RSB, no diastolic murmur             Abdomen:                    soft, non-tender, no masses              Extremities:                 warm, well-perfused, right dp and pt pulses palpable, none on left, mild bilateral lower extremity edema             Rectal/GU                   Deferred             Neuro:                         Grossly non-focal and symmetrical throughout             Skin:                            Clean and dry, no rashes, no breakdown   Diagnostic Tests:   ECHOCARDIOGRAM REPORT  Patient Name:   Christie Moreno Date of Exam: 03/12/2024  Medical Rec #:  [id number]          Height:       61.5 in  Accession #:    [id number]         Weight:       83.0 lb  Date of Birth:  13-Aug-1955          BSA:          1.308 m  Patient Age:    68 years           BP:           112/68 mmHg  Patient Gender: F                  HR:           70 bpm.  Exam Location:  [address]    Procedure: 2D Echo, 3D Echo, Cardiac Doppler and Color Doppler (Both  Spectral            and Color Flow Doppler were utilized during procedure).   Indications:    I50.21 CHF    History:        Patient has prior history of Echocardiogram examinations,  most                 recent 03/15/2023. CHF, CAD; Risk Factors:Hypertension,  Family                 History of Coronary Artery Disease, Dyslipidemia and  Current                 Smoker.    Sonographer:    Heather Hawks RDCS  Referring Phys: JONATHAN J BERRY   IMPRESSIONS     1. Left ventricular ejection fraction, by estimation, is 55 to 60%. The  left ventricle has normal function. The left ventricle has no regional  wall motion abnormalities. Left ventricular diastolic parameters are  consistent with Grade I diastolic  dysfunction (impaired relaxation).   2. Right ventricular systolic function is normal. The right ventricular  size is normal. There is mildly elevated pulmonary artery systolic  pressure. The estimated right ventricular systolic pressure is 38.9 mmHg.   3. Left atrial size was mild to moderately dilated.   4. The mitral valve is degenerative. Trivial mitral valve regurgitation.  Mild mitral stenosis. The mean mitral valve gradient is 3.0 mmHg. Moderate  to severe mitral annular calcification.   5. The aortic valve is tricuspid. There is severe calcifcation of the  aortic valve. Aortic valve regurgitation is mild to moderate. Moderate to  severe paradoxical low flow/low gradient aortic valve stenosis. Aortic  valve area, by VTI measures 0.83 cm.  Aortic valve mean gradient measures 18.0 mmHg.   6. The inferior vena cava is normal in size with <50% respiratory  variability, suggesting right atrial pressure of 8 mmHg.   FINDINGS   Left Ventricle: Left ventricular ejection fraction, by estimation, is 55  to 60%. The left ventricle has normal function. The left ventricle has no  regional wall motion  abnormalities. The left ventricular internal cavity  size was normal in size. There is   no left ventricular hypertrophy. Left ventricular diastolic parameters  are consistent with Grade I diastolic dysfunction (impaired relaxation).   Right Ventricle: The right ventricular size is normal. No increase in  right ventricular wall thickness. Right ventricular systolic function is  normal. There is mildly elevated  pulmonary artery systolic pressure. The  tricuspid regurgitant velocity is 2.78   m/s, and with an assumed right atrial pressure of 8 mmHg, the estimated  right ventricular systolic pressure is 38.9 mmHg.   Left Atrium: Left atrial size was mild to moderately dilated.   Right Atrium: Right atrial size was normal in size.   Pericardium: There is no evidence of pericardial effusion.   Mitral Valve: The mitral valve is degenerative in appearance. There is  moderate calcification of the mitral valve leaflet(s). Moderate to severe  mitral annular calcification. Trivial mitral valve regurgitation. Mild  mitral valve stenosis. MV peak  gradient, 10.2 mmHg. The mean mitral valve gradient is 3.0 mmHg.   Tricuspid Valve: The tricuspid valve is normal in structure. Tricuspid  valve regurgitation is trivial.   Aortic Valve: The aortic valve is tricuspid. There is severe calcifcation  of the aortic valve. Aortic valve regurgitation is mild to moderate.  Aortic regurgitation PHT measures 318 msec. Moderate to severe aortic  stenosis is present. Aortic valve mean  gradient measures 18.0 mmHg. Aortic valve peak gradient measures 29.3  mmHg. Aortic valve area, by VTI measures 0.83 cm.   Pulmonic Valve: The pulmonic valve was normal in structure. Pulmonic valve  regurgitation is not visualized.   Aorta: The aortic root is normal in size and structure.   Venous: The inferior vena cava is normal in size with less than 50%  respiratory variability, suggesting right atrial pressure of 8  mmHg.   IAS/Shunts: No atrial level shunt detected by color flow Doppler.     LEFT VENTRICLE  PLAX 2D  LVIDd:         3.80 cm   Diastology  LVIDs:         2.50 cm   LV e' medial:    6.31 cm/s  LV PW:         1.05 cm   LV E/e' medial:  19.3  LV IVS:        0.95 cm   LV e' lateral:   6.74 cm/s  LVOT diam:     1.95 cm   LV E/e' lateral: 18.0  LV SV:         48  LV SV Index:   37  LVOT Area:     2.99 cm                             3D Volume EF:                           3D EF:        62 %                           LV EDV:       120 ml                           LV ESV:       46 ml                           LV SV:        74 ml   RIGHT VENTRICLE  RV Basal diam:  3.10 cm  RV S prime:     12.30 cm/s  TAPSE (M-mode): 2.0 cm  RVSP:           33.9 mmHg   LEFT ATRIUM             Index        RIGHT ATRIUM           Index  LA diam:        3.30 cm 2.52 cm/m   RA Pressure: 3.00 mmHg  LA Vol (A2C):   53.4 ml 40.84 ml/m  RA Area:     10.60 cm  LA Vol (A4C):   46.3 ml 35.41 ml/m  RA Volume:   25.50 ml  19.50 ml/m  LA Biplane Vol: 54.2 ml 41.45 ml/m   AORTIC VALVE  AV Area (Vmax):    0.81 cm  AV Area (Vmean):   0.75 cm  AV Area (VTI):     0.83 cm  AV Vmax:           270.50 cm/s  AV Vmean:          188.000 cm/s  AV VTI:            0.572 m  AV Peak Grad:      29.3 mmHg  AV Mean Grad:      18.0 mmHg  LVOT Vmax:         72.93 cm/s  LVOT Vmean:        46.933 cm/s  LVOT VTI:          0.160 m  LVOT/AV VTI ratio: 0.28  AI PHT:            318 msec    AORTA  Ao Root diam: 2.60 cm  Ao Asc diam:  2.80 cm   MITRAL VALVE                TRICUSPID VALVE  MV Area (PHT)  cm          TR Peak grad:   30.9 mmHg  MV Area VTI:   1.15 cm     TR Vmax:        278.00 cm/s  MV Peak grad:  10.2 mmHg    Estimated RAP:  3.00 mmHg  MV Mean grad:  3.0 mmHg     RVSP:           33.9 mmHg  MV Vmax:       1.60 m/s  MV Vmean:      81.1 cm/s    SHUNTS  MV Decel Time: 230 msec     Systemic VTI:  0.16 m   MR Peak grad: 165.9 mmHg    Systemic Diam: 1.95 cm  MR Mean grad: 113.0 mmHg  MR Vmax:      644.00 cm/s  MR Vmean:     506.0 cm/s  MV E velocity: 121.50 cm/s  MV A velocity: 149.50 cm/s  MV E/A ratio:  0.81   Dalton McleanMD  Electronically signed by Ezra Kanner  Signature Date/Time: 03/12/2024/8:12:20 PM        Final      rocedures   RIGHT HEART CATH AND CORONARY ANGIOGRAPHY    Conclusion       Mid Cx to Dist Cx lesion is 75% stenosed.   Mid Cx lesion is 30% stenosed.   1st Diag lesion is 40% stenosed.   2nd Mrg lesion is 40% stenosed.   RPDA lesion is 80% stenosed.   1.  Mild to moderate nonobstructive coronary artery disease relatively unchanged versus previous study from 2023. 2.  Fick cardiac output of 4.7 L/min and Fick cardiac index of 3.6 L/min/m with the following hemodynamics:            Right atrial pressure mean of 6 mmHg            Right ventricular pressure 35/1 with an end-diastolic pressure of 9 mmHg            Wedge pressure mean of 17 mmHg with V waves to 21 mmHg            PA pressure of 33/16 with a mean of 23 mmHg            PVR of 1.3 Woods units            PA pulsatility index of 2.8 3.  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  If a TAVR is to be pursued, the right femoral artery (with radial secondary access site) appears feasible.   Recommendation: Continue evaluation for potential aortic valve intervention.   Procedural Details   Technical Details The patient is a 68 year old female with a history of paradoxical low-flow low gradient aortic stenosis, mild to moderate coronary artery disease on previous cardiac catheterization treated medically, hyperlipidemia, aortic atherosclerosis, peripheral vascular disease with history of mesenteric ischemia status post SMA stenting and known left common iliac occlusion was seen in the outpatient setting due to worsening dyspnea.   She is referred for right heart catheterization and coronary angiography as part of an evaluation for potential aortic valve intervention.  After obtaining consent, the patient brought to the cardiac catheterization laboratory and prepped draped sterile fashion.  Xylocaine  was used to anesthetize the right wrist and a 6 French Terumo glide sheath was placed.  3000 units of heparin  and 5 mg of Rappold were administered through the sheath.  Previously placed antecubital IV was exchanged for a 5 French femoral glide sheath.  A 6 [place] TIG catheter was used for selective coronary angiography.  A 5 French balloontipped catheter was used for right heart catheterization.  A 5 French pigtail catheter was maneuvered to the distal abdominal aorta and abdominal aortography and bilateral iliofemoral angiography was performed.  After review of the angiographic images and hemodynamic data, no further interventions were pursued.  A TR band was placed and manual pressure applied to the antecubital site.  There were no acute complications. Estimated blood loss <50 mL.   During this procedure medications were administered to achieve and maintain moderate conscious sedation while the patient's heart rate, blood pressure, and oxygen saturation were continuously monitored and I was present face-to-face 100% of this time. Jasmine Pearce Cardiovascular Specialist and [facility] Rad Tech are independent, trained observers who assisted in the monitoring of the patient's level of consciousness.    Medications (Filter: Administrations occurring from [phone] to 0943 on 04/09/24) lidocaine  (PF) (XYLOCAINE ) 1 % injection (mL)  Total volume: 4 mL Date/Time Rate/Dose/Volume Action    04/09/24 0854 2 mL Given    0855 2 mL Given    heparin  sodium (porcine) injection (Units)  Total dose: 3,000 Units Date/Time Rate/Dose/Volume Action    04/09/24 0856 3,000 Units Given    Radial Cocktail/Verapamil  only (mL)  Total volume: 10  mL Date/Time Rate/Dose/Volume Action    04/09/24 0856 10 mL Given    Heparin  (Porcine) in NaCl 1000-0.9 UT/500ML-% SOLN (mL)  Total volume: 1,000 mL Date/Time Rate/Dose/Volume Action    04/09/24 0856 1,000 mL Given  midazolam  (VERSED ) injection (mg)  Total dose: 1 mg Date/Time Rate/Dose/Volume Action    04/09/24 0854 1 mg Given    fentaNYL  (SUBLIMAZE ) injection (mcg)  Total dose: 25 mcg Date/Time Rate/Dose/Volume Action    04/09/24 0854 25 mcg Given    iohexol  (OMNIPAQUE ) 350 MG/ML injection (mL)  Total volume: 55 mL Date/Time Rate/Dose/Volume Action    04/09/24 0916 55 mL Given      Sedation Time   Sedation Time Physician-1: 21 minutes 6 seconds Contrast        Administrations occurring from 0836 to 0943 on 04/09/24:  Medication Name Total Dose  iohexol  (OMNIPAQUE ) 350 MG/ML injection 55 mL    Radiation/Fluoro   Fluoro time: 5.7 (min) DAP: 4227 (mGycm2) Cumulative Air Kerma: 51 (mGy) Complications   Complications documented before study signed (04/09/2024  9:50 AM)    No complications were associated with this study.  Documented by Jaynee Allena CROME, RT - 04/09/2024  8:32 AM      Coronary Findings   Diagnostic Dominance: Right Left Anterior Descending  The vessel exhibits minimal luminal irregularities.    First Diagonal Branch  1st Diag lesion is 40% stenosed.    Second Diagonal Branch  Vessel is small in size.    Left Circumflex  The vessel exhibits minimal luminal irregularities.  Mid Cx lesion is 30% stenosed.  Mid Cx to Dist Cx lesion is 75% stenosed.    First Obtuse Marginal Branch  Vessel is small in size.    Second Obtuse Marginal Branch  2nd Mrg lesion is 40% stenosed.    Right Coronary Artery  There is moderate diffuse disease throughout the vessel.    Right Posterior Descending Artery  Vessel is small in size.  RPDA lesion is 80% stenosed.    First Right Posterolateral Branch  Vessel is large in size.    Intervention    No  interventions have been documented.    Coronary Diagrams   Diagnostic Dominance: Right  Intervention    Implants    No implant documentation for this case.    Syngo Images    Show images for CARDIAC CATHETERIZATION Images on Long Term Storage    Show images for Neal, Oshea to Procedure Log   Procedure Log    Hemodynamics   Pressures Phases Resting  Right      RA Mean  mmHg 6    RA A-Wave  mmHg 12    RA V-Wave  mmHg 8  Pulmonary      PA  mmHg 33/16 (23)    PCW Mean  mmHg 17.0    PCW A-Wave  mmHg 19.0    PCW V-Wave  mmHg 21.0    PAPi   2.8    Saturations Phases Resting    PA  % 68    Arterial  % 95    Hemo Data   Flowsheet Row Most Recent Value  Fick Cardiac Output 4.73 L/min  Fick Cardiac Output Index 3.58 (L/min)/BSA  RA A Wave 12 mmHg  RA V Wave 8 mmHg  RA Mean 6 mmHg  RV Systolic Pressure 35 mmHg  RV Diastolic Pressure 1 mmHg  RV EDP 9 mmHg  PA Systolic Pressure 33 mmHg  PA Diastolic Pressure 16 mmHg  PA Mean 23 mmHg  PW A Wave 19 mmHg  PW V Wave 21 mmHg  PW Mean 17 mmHg  AO Systolic Pressure 178 mmHg  AO Diastolic Pressure 56 mmHg  AO Mean 99 mmHg  QP/QS  0.96  TPVR Index 6.67 HRUI  TSVR Index 26.52 HRUI  PVR SVR Ratio 0.07  TPVR/TSVR Ratio 0.25    Narrative & Impression  CLINICAL DATA:  Aortic valve replacement (TAVR), pre-op eval   EXAM: Cardiac TAVR CT   TECHNIQUE: A non-contrast, gated CT scan was obtained with axial slices of 2.5 mm through the heart for aortic valve scoring. A 120 kV retrospective, gated, contrast cardiac scan was obtained. Gantry rotation speed was 230 msec and collimation was 0.63 mm. Nitroglycerin  was not given. A delayed scan was obtained to exclude left atrial appendage thrombus. The 3D dataset was reconstructed in systole with motion correction. The 3D data set was reconstructed in 5% intervals of the 0-95% of the R-R cycle. Systolic and diastolic phases were analyzed on a dedicated  workstation using MPR, MIP, and VRT modes. The patient received 100 cc of contrast.   FINDINGS: Aortic Valve:   Tricuspid aortic valve with moderately reduced cusp excursion. Severely thickened cusps along coaptation surface, which appears to cause leaflet retraction and lack of central coaptation. 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 Severely calcified aortic valve cusps.   AV calcium  score: 1238   Virtual Basal Annulus Measurements:   Maximum/Minimum Diameter: 25.6 x 22.5 mm   Perimeter: 74.7 mm   Area:  435 mm2   Focal LVOT calcification below left coronary cusp.   Membranous septal length: 9.3 mm   Based on these measurements, the annulus would be suitable for a 26 mm Sapien 3 valve. Alternatively, Heart Team can consider 29 mm Evolut valve. Recommend Heart Team discussion for valve selection.   Sinus of Valsalva Measurements:   Non-coronary:  32 mm   Right - coronary:  31 mm   Left - coronary:  31 mm   Coronary height and sinus of Valsalva Height:   Left main: 17.6 mm, Left sinus: 23.5 mm   Right coronary: 18.1 mm, Right sinus: 22.7 mm   Aorta: Conventional 3 vessel branch pattern of aortic arch. Severe aortic atherosclerosis.   Sinotubular Junction:  32 mm   Ascending Thoracic Aorta:  36 mm   Aortic Arch:  27 mm   Descending Thoracic Aorta:  28 mm   Coronary Arteries: Normal coronary origin. Right dominance. The study was performed without use of NTG and insufficient for plaque evaluation. Coronary artery calcium  seen in 3 vessel distribution.   Optimum Fluoroscopic Angle for Delivery: LAO 9 CAU 18   OTHER:   Atria: LA dilation   Left atrial appendage: No thrombus.   Mitral valve: Abnormal, severe mitral annular calcifications.   Pulmonary artery: Normal caliber.   Pulmonary veins: Normal anatomy.   IMPRESSION: 1. Tricuspid aortic valve with moderately reduced cusp  excursion. Severely thickened cusps along coaptation surface, which appears to cause leaflet retraction and lack of central coaptation. 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 Severely calcified aortic valve cusps. 2. Aortic valve calcium  score: 1238 3. Annulus area: 435 mm2, suitable for 26 mm Sapien 3 valve. Focal LVOT calcifications. Membranous septal length 9.3 mm. 4. Sufficient coronary artery heights from annulus. 5. Optimum fluoroscopic angle for delivery: LAO 9 CAU 18   Electronically Signed: By: Soyla Merck M.D. On: 04/22/2024 13:34      Narrative & Impression  CLINICAL DATA:  Aortic valve replacement, preoperative evaluation   EXAM: CTA ABDOMEN AND PELVIS WITHOUT AND WITH CONTRAST   TECHNIQUE: Multidetector CT imaging of the abdomen and pelvis was performed  using the standard protocol during bolus administration of intravenous contrast. Multiplanar reconstructed images and MIPs were obtained and reviewed to evaluate the vascular anatomy.   RADIATION DOSE REDUCTION: This exam was performed according to the departmental dose-optimization program which includes automated exposure control, adjustment of the mA and/or kV according to patient size and/or use of iterative reconstruction technique.   CONTRAST:  OMNIPAQUE  IOHEXOL  350 MG/ML SOLN   COMPARISON:  CT of the abdomen and pelvis performed July 19, 2023   FINDINGS: VASCULAR   Aorta: Abdominal aortic aneurysm with moderate volume of wall adherent thrombus/plaque. The infrarenal segment measures 3.1 x 3.1 cm.   Celiac: Downward displacement with moderate stenosis in the proximal segment.   SMA: Stent material is present within the SMA which is patent. Mild atherosclerotic changes are present in the mid segment.   Renals: Multiple left renal arteries. The dominant left renal artery is patent. A left lower pole renal artery is patent but  demonstrates a severe stenosis in the proximal segment. Moderate stenosis in the right main renal artery proximally.   IMA: Likely occluded at the origin with proximal segment reconstitution via collaterals.   Inflow: The right common iliac artery is ectatic with mild atherosclerotic changes. Minimal diameter is estimated at 12 mm. Right internal iliac artery is patent. Right external iliac artery demonstrates mild atherosclerotic changes with minimal diameter estimated at 4 mm which is profiled on image 189 of CT series 310.   The left common iliac artery is occluded. The proximal left internal iliac artery is occluded. The left external iliac artery is occluded.   Proximal Outflow: The right common femoral artery is patent with minimal diameter estimated at 4.5 mm. The left common femoral artery is reconstituted via collaterals with minimal diameter estimated at 2.5 mm.   Veins: No obvious venous abnormality within the limitations of this arterial phase study.   Review of the MIP images confirms the above findings.   NON-VASCULAR   Lower chest: Reported separately.   Hepatobiliary: No focal liver abnormality is seen. No gallstones, gallbladder wall thickening, or biliary dilatation.   Pancreas: Unremarkable. No pancreatic ductal dilatation or surrounding inflammatory changes.   Spleen: Normal in size without focal abnormality.   Adrenals/Urinary Tract: No hydronephrosis. Cortical thinning is present in the left kidney. A small left renal cyst is present estimated at 8 mm.   Stomach/Bowel: No dilated loops of bowel are seen.   Lymphatic: No significant lymphadenopathy.   Reproductive: Uterus and bilateral adnexa are unremarkable.   Other: Nothing significant.   Musculoskeletal: No acute or significant osseous findings.   IMPRESSION: 1. Minimal access vessel diameters detailed above. 2. 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 3.  Abdominal aortic aneurysm measuring 3.1 cm. 4. Patent SMA stent. 5. 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     Electronically Signed   By: Maude Naegeli M.D.   On: 04/21/2024 06:56        Impression:   This 68 year old woman has stage D, severe, symptomatic, paradoxical low-flow/low gradient aortic stenosis with NYHA class II symptoms of exertional fatigue and shortness of breath consistent with chronic diastolic congestive heart failure.  I have personally reviewed her 2D echocardiogram, cardiac catheterization, and CTA studies.  Her echocardiogram shows a severely calcified and thickened aortic valve with restricted leaflet mobility.  The mean gradient across the valve is only 18 mmHg but the valve  area by VTI is 0.83 cm with a low stroke-volume index of 37 and a normal left ventricular ejection fraction of 55 to 60%.  Cardiac catheterization shows a 75% mid left circumflex stenosis and 80% stenosis in the posterior descending branch which has not changed significantly since 2023.  She has no anginal symptoms and I think this can be treated medically.  I agree that aortic valve replacement is indicated in this patient for relief of her symptoms and to prevent further left ventricular dysfunction.  In addition she also has an occluded left common iliac artery and may need left lower extremity revascularization in the near future.  Her gated cardiac CTA shows anatomy suitable for TAVR using a 26 mm SAPIEN 3 valve.  Her abdominal and pelvic CTA appears to show adequate transfemoral access on the right side.  Her left radial artery can be used for secondary access for the pigtail catheter.  She does appear to have adequate access measurements for the left subclavian and left carotid arteries if needed.   The patient was counseled at length regarding treatment alternatives for management of severe symptomatic aortic  stenosis. The risks and benefits of surgical intervention has been discussed in detail. Long-term prognosis with medical therapy was discussed. Alternative approaches such as conventional surgical aortic valve replacement, transcatheter aortic valve replacement, and palliative medical therapy were compared and contrasted at length. This discussion was placed in the context of the patient's own specific clinical presentation and past medical history. All of her questions have been addressed.    Following the decision to proceed with transcatheter aortic valve replacement, a discussion was held regarding what types of management strategies would be attempted intraoperatively in the event of life-threatening complications, including whether or not the patient would be considered a candidate for the use of cardiopulmonary bypass and/or conversion to open sternotomy for attempted surgical intervention.  I do not think she is a candidate for emergent sternotomy to manage any intraoperative complications due to the combination of her age, frailty with a BMI of 16, and diffuse vascular disease with ongoing smoking and likely severe COPD based on the appearance of her CT scan of the chest.  The patient has been advised of a variety of complications that might develop including but not limited to risks of death, stroke, paravalvular leak, aortic dissection or other major vascular complications, aortic annulus rupture, device embolization, cardiac rupture or perforation, mitral regurgitation, acute myocardial infarction, arrhythmia, heart block or bradycardia requiring permanent pacemaker placement, congestive heart failure, respiratory failure, renal failure, pneumonia, infection, other late complications related to structural valve deterioration or migration, or other complications that might ultimately cause a temporary or permanent loss of functional independence or other long term morbidity. The patient provides full  informed consent for the procedure as described and all questions were answered.       Plan:   Right transfemoral TAVR using a SAPIEN 3 valve.   Dorise LOIS Fellers, MD

## 2024-05-22 ENCOUNTER — Inpatient Hospital Stay (HOSPITAL_COMMUNITY)
Admission: RE | Admit: 2024-05-22 | Discharge: 2024-05-25 | DRG: 266 | Disposition: A | Discharge status: Home / Self Care | Attending: Internal Medicine | Admitting: Internal Medicine

## 2024-05-22 ENCOUNTER — Inpatient Hospital Stay (HOSPITAL_COMMUNITY)

## 2024-05-22 ENCOUNTER — Encounter (HOSPITAL_COMMUNITY): Payer: Self-pay | Admitting: Internal Medicine

## 2024-05-22 ENCOUNTER — Inpatient Hospital Stay (HOSPITAL_COMMUNITY): Payer: Self-pay | Admitting: Certified Registered Nurse Anesthetist

## 2024-05-22 ENCOUNTER — Inpatient Hospital Stay (HOSPITAL_COMMUNITY): Payer: Self-pay | Admitting: Physician Assistant

## 2024-05-22 ENCOUNTER — Encounter (HOSPITAL_COMMUNITY): Admission: RE | Disposition: A | Payer: Self-pay | Source: Home / Self Care | Attending: Internal Medicine

## 2024-05-22 ENCOUNTER — Other Ambulatory Visit: Payer: Self-pay

## 2024-05-22 DIAGNOSIS — Z885 Allergy status to narcotic agent status: Secondary | ICD-10-CM | POA: Diagnosis not present

## 2024-05-22 DIAGNOSIS — I7300 Raynaud's syndrome without gangrene: Secondary | ICD-10-CM | POA: Diagnosis present

## 2024-05-22 DIAGNOSIS — I5030 Unspecified diastolic (congestive) heart failure: Secondary | ICD-10-CM | POA: Diagnosis present

## 2024-05-22 DIAGNOSIS — I350 Nonrheumatic aortic (valve) stenosis: Secondary | ICD-10-CM

## 2024-05-22 DIAGNOSIS — Z952 Presence of prosthetic heart valve: Secondary | ICD-10-CM | POA: Diagnosis not present

## 2024-05-22 DIAGNOSIS — E43 Unspecified severe protein-calorie malnutrition: Secondary | ICD-10-CM | POA: Diagnosis present

## 2024-05-22 DIAGNOSIS — Z833 Family history of diabetes mellitus: Secondary | ICD-10-CM | POA: Diagnosis not present

## 2024-05-22 DIAGNOSIS — I358 Other nonrheumatic aortic valve disorders: Secondary | ICD-10-CM | POA: Diagnosis present

## 2024-05-22 DIAGNOSIS — Z8419 Family history of other disorders of kidney and ureter: Secondary | ICD-10-CM

## 2024-05-22 DIAGNOSIS — F17210 Nicotine dependence, cigarettes, uncomplicated: Secondary | ICD-10-CM | POA: Diagnosis present

## 2024-05-22 DIAGNOSIS — Z7902 Long term (current) use of antithrombotics/antiplatelets: Secondary | ICD-10-CM

## 2024-05-22 DIAGNOSIS — E039 Hypothyroidism, unspecified: Secondary | ICD-10-CM | POA: Diagnosis present

## 2024-05-22 DIAGNOSIS — I739 Peripheral vascular disease, unspecified: Secondary | ICD-10-CM | POA: Diagnosis not present

## 2024-05-22 DIAGNOSIS — Z880 Allergy status to penicillin: Secondary | ICD-10-CM

## 2024-05-22 DIAGNOSIS — J441 Chronic obstructive pulmonary disease with (acute) exacerbation: Secondary | ICD-10-CM | POA: Diagnosis present

## 2024-05-22 DIAGNOSIS — F17200 Nicotine dependence, unspecified, uncomplicated: Secondary | ICD-10-CM | POA: Diagnosis present

## 2024-05-22 DIAGNOSIS — Z823 Family history of stroke: Secondary | ICD-10-CM

## 2024-05-22 DIAGNOSIS — I110 Hypertensive heart disease with heart failure: Secondary | ICD-10-CM | POA: Diagnosis present

## 2024-05-22 DIAGNOSIS — G40909 Epilepsy, unspecified, not intractable, without status epilepticus: Secondary | ICD-10-CM

## 2024-05-22 DIAGNOSIS — I442 Atrioventricular block, complete: Secondary | ICD-10-CM | POA: Diagnosis not present

## 2024-05-22 DIAGNOSIS — E785 Hyperlipidemia, unspecified: Secondary | ICD-10-CM | POA: Diagnosis present

## 2024-05-22 DIAGNOSIS — K5900 Constipation, unspecified: Secondary | ICD-10-CM | POA: Diagnosis present

## 2024-05-22 DIAGNOSIS — Z7982 Long term (current) use of aspirin: Secondary | ICD-10-CM

## 2024-05-22 DIAGNOSIS — Z681 Body mass index (BMI) 19 or less, adult: Secondary | ICD-10-CM | POA: Diagnosis not present

## 2024-05-22 DIAGNOSIS — Z803 Family history of malignant neoplasm of breast: Secondary | ICD-10-CM | POA: Diagnosis not present

## 2024-05-22 DIAGNOSIS — Z79890 Hormone replacement therapy (postmenopausal): Secondary | ICD-10-CM

## 2024-05-22 DIAGNOSIS — I428 Other cardiomyopathies: Secondary | ICD-10-CM | POA: Diagnosis present

## 2024-05-22 DIAGNOSIS — I352 Nonrheumatic aortic (valve) stenosis with insufficiency: Principal | ICD-10-CM | POA: Diagnosis present

## 2024-05-22 DIAGNOSIS — Y839 Surgical procedure, unspecified as the cause of abnormal reaction of the patient, or of later complication, without mention of misadventure at the time of the procedure: Secondary | ICD-10-CM | POA: Diagnosis not present

## 2024-05-22 DIAGNOSIS — I2510 Atherosclerotic heart disease of native coronary artery without angina pectoris: Secondary | ICD-10-CM | POA: Diagnosis present

## 2024-05-22 DIAGNOSIS — Z79899 Other long term (current) drug therapy: Secondary | ICD-10-CM | POA: Diagnosis not present

## 2024-05-22 DIAGNOSIS — I5032 Chronic diastolic (congestive) heart failure: Secondary | ICD-10-CM | POA: Diagnosis present

## 2024-05-22 DIAGNOSIS — I5023 Acute on chronic systolic (congestive) heart failure: Secondary | ICD-10-CM | POA: Diagnosis not present

## 2024-05-22 DIAGNOSIS — I493 Ventricular premature depolarization: Secondary | ICD-10-CM | POA: Diagnosis present

## 2024-05-22 DIAGNOSIS — Z955 Presence of coronary angioplasty implant and graft: Secondary | ICD-10-CM | POA: Diagnosis not present

## 2024-05-22 DIAGNOSIS — Z801 Family history of malignant neoplasm of trachea, bronchus and lung: Secondary | ICD-10-CM | POA: Diagnosis not present

## 2024-05-22 DIAGNOSIS — Z006 Encounter for examination for normal comparison and control in clinical research program: Secondary | ICD-10-CM | POA: Diagnosis not present

## 2024-05-22 HISTORY — DX: Chronic obstructive pulmonary disease, unspecified: J44.9

## 2024-05-22 HISTORY — PX: INTRAOPERATIVE TRANSTHORACIC ECHOCARDIOGRAM: SHX6523

## 2024-05-22 HISTORY — DX: Heart failure, unspecified: I50.9

## 2024-05-22 HISTORY — DX: Hypothyroidism, unspecified: E03.9

## 2024-05-22 LAB — POCT I-STAT, CHEM 8
BUN: 16 mg/dL (ref 8–23)
Calcium, Ion: 1.18 mmol/L (ref 1.15–1.40)
Chloride: 103 mmol/L (ref 98–111)
Creatinine, Ser: 0.6 mg/dL (ref 0.44–1.00)
Glucose, Bld: 94 mg/dL (ref 70–99)
HCT: 26 % — ABNORMAL LOW (ref 36.0–46.0)
Hemoglobin: 8.8 g/dL — ABNORMAL LOW (ref 12.0–15.0)
Potassium: 3.2 mmol/L — ABNORMAL LOW (ref 3.5–5.1)
Sodium: 139 mmol/L (ref 135–145)
TCO2: 23 mmol/L (ref 22–32)

## 2024-05-22 LAB — ECHOCARDIOGRAM LIMITED
AR max vel: 2.56 cm2
AV Area VTI: 2.15 cm2
AV Area mean vel: 2.74 cm2
AV Mean grad: 2 mmHg
AV Peak grad: 3.7 mmHg
Ao pk vel: 0.96 m/s
Calc EF: 51.5 %
MV VTI: 1.12 cm2
Single Plane A2C EF: 47.3 %
Single Plane A4C EF: 50.2 %

## 2024-05-22 LAB — POCT ACTIVATED CLOTTING TIME: Activated Clotting Time: 239 s

## 2024-05-22 LAB — ABO/RH: ABO/RH(D): A POS

## 2024-05-22 SURGERY — TRANSCATHETER AORTIC VALVE REPLACEMENT, TRANSFEMORAL (CATHLAB)
Anesthesia: Monitor Anesthesia Care | Laterality: Right

## 2024-05-22 MED ORDER — PROPOFOL 500 MG/50ML IV EMUL
INTRAVENOUS | Status: DC | PRN
  Administered 2024-05-22: 25 ug/kg/min via INTRAVENOUS

## 2024-05-22 MED ORDER — VERAPAMIL HCL 2.5 MG/ML IV SOLN
INTRAVENOUS | Status: AC
  Filled 2024-05-22: qty 2

## 2024-05-22 MED ORDER — CHLORHEXIDINE GLUCONATE 4 % EX SOLN: 60.0000 mL | Freq: Once | CUTANEOUS | Status: DC

## 2024-05-22 MED ORDER — POTASSIUM CHLORIDE CRYS ER 20 MEQ PO TBCR
40.0000 meq | EXTENDED_RELEASE_TABLET | Freq: Once | ORAL | Status: AC
  Administered 2024-05-22: 40 meq via ORAL
  Filled 2024-05-22: qty 2

## 2024-05-22 MED ORDER — ESTROGENS CONJUGATED 0.625 MG PO TABS
1.2500 mg | ORAL_TABLET | Freq: Every day | ORAL | Status: DC
  Administered 2024-05-22 – 2024-05-25 (×4): 1.25 mg via ORAL
  Filled 2024-05-22 (×4): qty 2

## 2024-05-22 MED ORDER — PROPOFOL 10 MG/ML IV BOLUS
INTRAVENOUS | Status: DC | PRN
  Administered 2024-05-22 (×2): 10 mg via INTRAVENOUS

## 2024-05-22 MED ORDER — HEPARIN (PORCINE) IN NACL 1000-0.9 UT/500ML-% IV SOLN
INTRAVENOUS | Status: DC | PRN
  Administered 2024-05-22: 1000 mL

## 2024-05-22 MED ORDER — TRAMADOL HCL 50 MG PO TABS
50.0000 mg | ORAL_TABLET | ORAL | Status: DC | PRN
  Administered 2024-05-22 – 2024-05-25 (×4): 50 mg via ORAL
  Filled 2024-05-22 (×2): qty 1
  Filled 2024-05-22: qty 2
  Filled 2024-05-22 (×2): qty 1

## 2024-05-22 MED ORDER — ASPIRIN 81 MG PO TBEC
81.0000 mg | DELAYED_RELEASE_TABLET | Freq: Every day | ORAL | Status: DC
  Administered 2024-05-22 – 2024-05-25 (×4): 81 mg via ORAL
  Filled 2024-05-22 (×4): qty 1

## 2024-05-22 MED ORDER — CLEVIDIPINE BUTYRATE 0.5 MG/ML IV EMUL
INTRAVENOUS | Status: DC | PRN
  Administered 2024-05-22: 2 mg/h via INTRAVENOUS

## 2024-05-22 MED ORDER — PROTAMINE SULFATE 10 MG/ML IV SOLN
INTRAVENOUS | Status: DC | PRN
Start: 2024-05-22 — End: 2024-05-22
  Administered 2024-05-22: 50 mg via INTRAVENOUS

## 2024-05-22 MED ORDER — LIDOCAINE HCL (PF) 1 % IJ SOLN
INTRAMUSCULAR | Status: AC
  Filled 2024-05-22: qty 30

## 2024-05-22 MED ORDER — NOREPINEPHRINE 4 MG/250ML-% IV SOLN
0.0000 ug/min | INTRAVENOUS | Status: DC
  Filled 2024-05-22: qty 250

## 2024-05-22 MED ORDER — NITROGLYCERIN IN D5W 200-5 MCG/ML-% IV SOLN: 0.0000 ug/min | INTRAVENOUS | Status: DC

## 2024-05-22 MED ORDER — LAMOTRIGINE 100 MG PO TABS
100.0000 mg | ORAL_TABLET | Freq: Two times a day (BID) | ORAL | Status: DC
  Administered 2024-05-22 – 2024-05-25 (×7): 100 mg via ORAL
  Filled 2024-05-22 (×8): qty 1

## 2024-05-22 MED ORDER — LEVOTHYROXINE SODIUM 25 MCG PO TABS
25.0000 ug | ORAL_TABLET | Freq: Every day | ORAL | Status: DC
  Administered 2024-05-23 – 2024-05-25 (×3): 25 ug via ORAL
  Filled 2024-05-22 (×3): qty 1

## 2024-05-22 MED ORDER — ENSURE ENLIVE PO LIQD
237.0000 mL | Freq: Two times a day (BID) | ORAL | Status: DC
  Filled 2024-05-22 (×7): qty 237

## 2024-05-22 MED ORDER — FENTANYL CITRATE (PF) 100 MCG/2ML IJ SOLN
INTRAMUSCULAR | Status: DC | PRN
  Administered 2024-05-22 (×3): 25 ug via INTRAVENOUS

## 2024-05-22 MED ORDER — CEFAZOLIN SODIUM-DEXTROSE 2-4 GM/100ML-% IV SOLN
2.0000 g | Freq: Three times a day (TID) | INTRAVENOUS | Status: AC
  Administered 2024-05-22 (×2): 2 g via INTRAVENOUS
  Filled 2024-05-22 (×2): qty 100

## 2024-05-22 MED ORDER — SODIUM CHLORIDE 0.9% FLUSH: 3.0000 mL | INTRAVENOUS | Status: DC | PRN

## 2024-05-22 MED ORDER — CLOPIDOGREL BISULFATE 75 MG PO TABS
75.0000 mg | ORAL_TABLET | Freq: Every day | ORAL | Status: DC
  Administered 2024-05-22 – 2024-05-23 (×2): 75 mg via ORAL
  Filled 2024-05-22 (×2): qty 1

## 2024-05-22 MED ORDER — SODIUM CHLORIDE 0.9 % IV SOLN: 250.0000 mL | INTRAVENOUS | Status: DC | PRN

## 2024-05-22 MED ORDER — ONDANSETRON HCL 4 MG/2ML IJ SOLN
4.0000 mg | Freq: Four times a day (QID) | INTRAMUSCULAR | Status: DC | PRN
  Administered 2024-05-22 – 2024-05-25 (×3): 4 mg via INTRAVENOUS
  Filled 2024-05-22 (×2): qty 2

## 2024-05-22 MED ORDER — SODIUM CHLORIDE 0.9 % IV SOLN: INTRAVENOUS | Status: DC

## 2024-05-22 MED ORDER — LACTATED RINGERS IV SOLN
INTRAVENOUS | Status: DC | PRN
Start: 2024-05-22 — End: 2024-05-22

## 2024-05-22 MED ORDER — IODIXANOL 320 MG/ML IV SOLN
INTRAVENOUS | Status: DC | PRN
Start: 2024-05-22 — End: 2024-05-22
  Administered 2024-05-22: 60 mL

## 2024-05-22 MED ORDER — HEPARIN SODIUM (PORCINE) 1000 UNIT/ML IJ SOLN
INTRAMUSCULAR | Status: DC | PRN
  Administered 2024-05-22: 1000 [IU] via INTRAVENOUS
  Administered 2024-05-22: 5000 [IU] via INTRAVENOUS

## 2024-05-22 MED ORDER — MORPHINE SULFATE (PF) 2 MG/ML IV SOLN
1.0000 mg | INTRAVENOUS | Status: DC | PRN
  Administered 2024-05-23: 2 mg via INTRAVENOUS
  Filled 2024-05-22: qty 1

## 2024-05-22 MED ORDER — SODIUM CHLORIDE 0.9 % IV SOLN: INTRAVENOUS | Status: AC

## 2024-05-22 MED ORDER — MIDAZOLAM HCL (PF) 2 MG/2ML IJ SOLN
INTRAMUSCULAR | Status: DC | PRN
  Administered 2024-05-22 (×2): 1 mg via INTRAVENOUS

## 2024-05-22 MED ORDER — SODIUM CHLORIDE 0.9% FLUSH
3.0000 mL | Freq: Two times a day (BID) | INTRAVENOUS | Status: DC
  Administered 2024-05-22 – 2024-05-25 (×6): 3 mL via INTRAVENOUS

## 2024-05-22 MED ORDER — INFLUENZA VAC SPLIT HIGH-DOSE 0.5 ML IM SUSY
0.5000 mL | PREFILLED_SYRINGE | INTRAMUSCULAR | Status: DC
  Filled 2024-05-22: qty 0.5

## 2024-05-22 MED ORDER — CHLORHEXIDINE GLUCONATE 0.12 % MT SOLN
15.0000 mL | Freq: Once | OROMUCOSAL | Status: AC
  Administered 2024-05-22: 15 mL via OROMUCOSAL
  Filled 2024-05-22: qty 15

## 2024-05-22 MED ORDER — FENTANYL CITRATE (PF) 100 MCG/2ML IJ SOLN
INTRAMUSCULAR | Status: AC
  Filled 2024-05-22: qty 2

## 2024-05-22 MED ORDER — PHENYLEPHRINE 80 MCG/ML (10ML) SYRINGE FOR IV PUSH (FOR BLOOD PRESSURE SUPPORT)
PREFILLED_SYRINGE | INTRAVENOUS | Status: DC | PRN
  Administered 2024-05-22: 160 ug via INTRAVENOUS

## 2024-05-22 MED ORDER — MIDAZOLAM HCL 2 MG/2ML IJ SOLN
INTRAMUSCULAR | Status: AC
  Filled 2024-05-22: qty 2

## 2024-05-22 MED ORDER — PROGESTERONE 200 MG PO CAPS
200.0000 mg | ORAL_CAPSULE | Freq: Every day | ORAL | Status: DC
  Administered 2024-05-22 – 2024-05-24 (×3): 200 mg via ORAL
  Filled 2024-05-22 (×4): qty 1

## 2024-05-22 MED ORDER — CHLORHEXIDINE GLUCONATE 4 % EX SOLN
30.0000 mL | CUTANEOUS | Status: DC
  Filled 2024-05-22: qty 30

## 2024-05-22 MED ORDER — ATORVASTATIN CALCIUM 80 MG PO TABS
80.0000 mg | ORAL_TABLET | Freq: Every day | ORAL | Status: DC
  Administered 2024-05-22 – 2024-05-24 (×3): 80 mg via ORAL
  Filled 2024-05-22 (×3): qty 1

## 2024-05-22 MED ORDER — LIDOCAINE HCL (PF) 1 % IJ SOLN
INTRAMUSCULAR | Status: DC | PRN
  Administered 2024-05-22: 5 mL
  Administered 2024-05-22: 2 mL

## 2024-05-22 MED ORDER — LACTATED RINGERS IV SOLN
INTRAVENOUS | Status: DC
Start: 2024-05-22 — End: 2024-05-22

## 2024-05-22 MED ORDER — VERAPAMIL HCL 2.5 MG/ML IV SOLN
INTRAVENOUS | Status: DC | PRN
  Administered 2024-05-22: 10 mL via INTRA_ARTERIAL

## 2024-05-22 SURGICAL SUPPLY — 25 items
CATH 26 ULTRA DELIVERY (CATHETERS) IMPLANT
CATH DIAG 6FR PIGTAIL ANGLED (CATHETERS) IMPLANT
CATH INFINITI 5FR ANG PIGTAIL (CATHETERS) IMPLANT
CATH INFINITI 6F AL1 (CATHETERS) IMPLANT
CLOSURE PERCLOSE PROSTYLE (Vascular Products) IMPLANT
CRIMPER (MISCELLANEOUS) IMPLANT
DEVICE INFLATION ATRION QL2530 (MISCELLANEOUS) IMPLANT
DEVICE RAD COMP TR BAND LRG (VASCULAR PRODUCTS) IMPLANT
GLIDESHEATH SLEND SS 6F .021 (SHEATH) IMPLANT
KIT SAPIAN 3 ULTRA RESILIA 26 (Valve) IMPLANT
PACK CARDIAC CATHETERIZATION (CUSTOM PROCEDURE TRAY) ×2 IMPLANT
PAD SORBX EP SHIELD 16.5X12 (MISCELLANEOUS) IMPLANT
SET ATX-X65L (MISCELLANEOUS) IMPLANT
SHEATH INTRODUCER SET 20-26 (SHEATH) IMPLANT
SHEATH PINNACLE 8F 10CM (SHEATH) IMPLANT
SYR CONTROL 10ML ANGIOGRAPHIC (SYRINGE) IMPLANT
TRANSDUCER W/STOPCOCK (MISCELLANEOUS) IMPLANT
TUBING ART PRESS 72 MALE/FEM (TUBING) IMPLANT
TUBING CIL FLEX 10 FLL-RA (TUBING) IMPLANT
WIRE AMPLATZ SS-J .035X180CM (WIRE) IMPLANT
WIRE EMERALD 3MM-J .035X150CM (WIRE) IMPLANT
WIRE EMERALD 3MM-J .035X260CM (WIRE) IMPLANT
WIRE EMERALD ST .035X260CM (WIRE) IMPLANT
WIRE MICRO SET SILHO 5FR 7 (SHEATH) IMPLANT
WIRE SAFARI SM CURVE 275 (WIRE) IMPLANT

## 2024-05-22 NOTE — Op Note (Signed)
 [facility]  TAVR OPERATIVE NOTE   Date of Procedure:  05/22/2024  Preoperative Diagnosis: Severe Aortic Stenosis   Postoperative Diagnosis: Same   Procedure:   Transcatheter Aortic Valve Replacement - Transfemoral Approach  Edwards Sapien 3 Resilia THV (size 26 mm, model # [id number])   Co-Surgeons:  Dorise LOIS Fellers, MD and Lurena Red, MD Anesthesiologist:  Keneth  Echocardiographer:  Croitoru  Pre-operative Echo Findings: Severe aortic stenosis Normal left ventricular systolic function  Post-operative Echo Findings: No paravalvular leak Normal left ventricular systolic function  Left Heart Catheterization Findings: Left ventricular end-diastolic pressure of   BRIEF CLINICAL NOTE AND INDICATIONS FOR SURGERY  The patient is a 68 year old female with a history of coronary artery disease, hyperlipidemia, hypothyroidism, mesenteric ischemia status post SMA balloon angioplasty and stenting, occluded left common iliac artery and paradoxical low-flow low gradient aortic stenosis who is referred for elective transcatheter aortic valve replacement from the right transfemoral approach with a 26 mm SAPIEN 3 valve.   During the course of the patient's preoperative work up they have been evaluated comprehensively by a multidisciplinary team of specialists coordinated through the Multidisciplinary Heart Valve Clinic in the [facility].  They have been demonstrated to suffer from symptomatic severe aortic stenosis as noted above. The patient has been counseled extensively as to the relative risks and benefits of all options for the treatment of severe aortic stenosis including long term medical therapy, conventional surgery for aortic valve replacement, and transcatheter aortic valve replacement.  The patient has been independently evaluated by Dr. Fellers with CT surgery and they are felt to be at high risk for conventional surgical aortic valve  replacement. The surgeon indicated the patient would be a poor candidate for conventional surgery. Based upon review of all of the patient's preoperative diagnostic tests they are felt to be candidate for transcatheter aortic valve replacement using the transfemoral approach as an alternative to high risk conventional surgery.    Following the decision to proceed with transcatheter aortic valve replacement, a discussion has been held regarding what types of management strategies would be attempted intraoperatively in the event of life-threatening complications, including whether or not the patient would be considered a candidate for the use of cardiopulmonary bypass and/or conversion to open sternotomy for attempted surgical intervention.  The patient has been advised of a variety of complications that might develop peculiar to this approach including but not limited to risks of death, stroke, paravalvular leak, aortic dissection or other major vascular complications, aortic annulus rupture, device embolization, cardiac rupture or perforation, acute myocardial infarction, arrhythmia, heart block or bradycardia requiring permanent pacemaker placement, congestive heart failure, respiratory failure, renal failure, pneumonia, infection, other late complications related to structural valve deterioration or migration, or other complications that might ultimately cause a temporary or permanent loss of functional independence or other long term morbidity.  The patient provides full informed consent for the procedure as described and all questions were answered preoperatively.    DETAILS OF THE OPERATIVE PROCEDURE  PREPARATION:   The patient is brought to the operating room on the above mentioned date and central monitoring was established by the anesthesia team. The patient is placed in the supine position on the operating table.  Intravenous antibiotics are administered. Conscious sedation is used.   Baseline  transthoracic echocardiogram was performed. The patient's chest, abdomen, both groins, and both lower extremities are prepared and draped in a sterile manner. A time out procedure is performed.  PERIPHERAL ACCESS:   Using the modified Seldinger technique, arterialaccess were obtained with placement of a 6 Fr sheath in the left radial artery using u/s guidance.  A pigtail diagnostic catheter was passed through the radial arterial sheath under fluoroscopic guidance into the aortic root.  Aortic root angiography was performed in order to determine the optimal angiographic angle for valve deployment.  TRANSFEMORAL ACCESS:  A micropuncture kit was used to gain access to the right common femoral artery using u/s guidance. Position confirmed with angiography. Pre-closure with double ProGlide closure devices. The patient was heparinized systemically and ACT verified > 250 seconds.    A 14 Fr transfemoral E-sheath was introduced into the right common femoral artery after progressively dilating over an Amplatz superstiff wire. An AL-1 catheter was used to direct a straight-tip exchange length wire across the native aortic valve into the left ventricle. This was exchanged out for a pigtail catheter and position was confirmed in the LV apex. Simultaneous left ventricular, aortic, and left ventricular end-diastolic pressures were recorded.  The pigtail catheter was then exchanged for an Safari wire in the LV apex.  Direct LV pacing thresholds were assessed and found to be adequate.   TRANSCATHETER HEART VALVE DEPLOYMENT:  An Edwards Sapien 3 THV (size 26 mm) was prepared and crimped per manufacturer's guidelines, and the proper orientation of the valve is confirmed on the [employer] delivery system. The valve was advanced through the introducer sheath using normal technique until in an appropriate position in the abdominal aorta beyond the sheath tip. The balloon was then retracted and using the fine-tuning  wheel was centered on the valve. The valve was then advanced across the aortic arch using appropriate flexion of the catheter. The valve was carefully positioned across the aortic valve annulus. The Commander catheter was retracted using normal technique. Once final position of the valve has been confirmed by angiographic assessment, the valve is deployed while temporarily holding ventilation and during rapid ventricular pacing to maintain systolic blood pressure < 50 mmHg and pulse pressure < 10 mmHg. The balloon inflation is held for >3 seconds after reaching full deployment volume. Once the balloon has fully deflated the balloon is retracted into the ascending aorta and valve function is assessed using TTE. There is felt to be no paravalvular leak and no central aortic insufficiency.  The patient's hemodynamic recovery following valve deployment is good.  The deployment balloon and guidewire are both removed. Echo demostrated acceptable post-procedural gradients, stable mitral valve function, and no AI.   PROCEDURE COMPLETION:  The sheath was then removed and closure devices were completed. Protamine was administered once femoral arterial repair was complete. The pigtail catheters and radial sheaths were removed with TR band device placed in the artery.    The patient tolerated the procedure well and is transported to the surgical intensive care in stable condition. There were no immediate intraoperative complications. All sponge instrument and needle counts are verified correct at completion of the operation.   No blood products were administered during the operation.  The patient received a total of 60 mL of intravenous contrast during the procedure.  Naoko Diperna K Allisa Einspahr MD 05/22/2024 11:32 AM

## 2024-05-22 NOTE — Progress Notes (Signed)
 TR band removed at 1545, transparent dressing applied and educated the patient to leave the dressing for next 24 hours, dressing C/D/I.

## 2024-05-22 NOTE — Op Note (Signed)
 [facility]   MULTIDISCIPLINARY HEART VALVE TEAM   TAVR OPERATIVE NOTE   Date of Procedure:  05/22/2024  Preoperative Diagnosis: Severe Aortic Stenosis   Postoperative Diagnosis: Same   Procedure:   Transcatheter Aortic Valve Replacement - Percutaneous Right Transfemoral Approach  Edwards Sapien 3 Ultra ResiliaTHV (size 26 mm, model # 9755RSL, serial # [id number])   Co-Surgeons:  Dorise LOIS Fellers, MD and Lurena Red, MD   Anesthesiologist:  Lynwood POUR. Keneth, MD  Echocardiographer:  CHRISTELLA. Croitoru, MD  Pre-operative Echo Findings: Severe aortic stenosis Normal left ventricular systolic function  Post-operative Echo Findings: No paravalvular leak Normal left ventricular systolic function   BRIEF CLINICAL NOTE AND INDICATIONS FOR SURGERY  This 68 year old woman has stage D, severe, symptomatic, paradoxical low-flow/low gradient aortic stenosis with NYHA class II symptoms of exertional fatigue and shortness of breath consistent with chronic diastolic congestive heart failure.  I have personally reviewed her 2D echocardiogram, cardiac catheterization, and CTA studies.  Her echocardiogram shows a severely calcified and thickened aortic valve with restricted leaflet mobility.  The mean gradient across the valve is only 18 mmHg but the valve area by VTI is 0.83 cm with a low stroke-volume index of 37 and a normal left ventricular ejection fraction of 55 to 60%.  Cardiac catheterization shows a 75% mid left circumflex stenosis and 80% stenosis in the posterior descending branch which has not changed significantly since 2023.  She has no anginal symptoms and I think this can be treated medically.  I agree that aortic valve replacement is indicated in this patient for relief of her symptoms and to prevent further left ventricular dysfunction.  In addition she also has an occluded left common iliac artery and may need left lower extremity revascularization in the near future.  Her  gated cardiac CTA shows anatomy suitable for TAVR using a 26 mm SAPIEN 3 valve.  Her abdominal and pelvic CTA appears to show adequate transfemoral access on the right side.  Her left radial artery can be used for secondary access for the pigtail catheter.  She does appear to have adequate access measurements for the left subclavian and left carotid arteries if needed.   The patient was counseled at length regarding treatment alternatives for management of severe symptomatic aortic stenosis. The risks and benefits of surgical intervention has been discussed in detail. Long-term prognosis with medical therapy was discussed. Alternative approaches such as conventional surgical aortic valve replacement, transcatheter aortic valve replacement, and palliative medical therapy were compared and contrasted at length. This discussion was placed in the context of the patient's own specific clinical presentation and past medical history. All of her questions have been addressed.    Following the decision to proceed with transcatheter aortic valve replacement, a discussion was held regarding what types of management strategies would be attempted intraoperatively in the event of life-threatening complications, including whether or not the patient would be considered a candidate for the use of cardiopulmonary bypass and/or conversion to open sternotomy for attempted surgical intervention.  I do not think she is a candidate for emergent sternotomy to manage any intraoperative complications due to the combination of her age, frailty with a BMI of 16, and diffuse vascular disease with ongoing smoking and likely severe COPD based on the appearance of her CT scan of the chest.  The patient has been advised of a variety of complications that might develop including but not limited to risks of death, stroke, paravalvular leak, aortic dissection or other major  vascular complications, aortic annulus rupture, device embolization,  cardiac rupture or perforation, mitral regurgitation, acute myocardial infarction, arrhythmia, heart block or bradycardia requiring permanent pacemaker placement, congestive heart failure, respiratory failure, renal failure, pneumonia, infection, other late complications related to structural valve deterioration or migration, or other complications that might ultimately cause a temporary or permanent loss of functional independence or other long term morbidity. The patient provides full informed consent for the procedure as described and all questions were answered.       DETAILS OF THE OPERATIVE PROCEDURE  PREPARATION:    The patient was brought to the operating room on the above mentioned date and appropriate monitoring was established by the anesthesia team. The patient was placed in the supine position on the operating table.  Intravenous antibiotics were administered. The patient was monitored closely throughout the procedure under conscious sedation.    Baseline transthoracic echocardiogram was performed. The patient's abdomen and both groins were prepped and draped in a sterile manner. A time out procedure was performed.   PERIPHERAL ACCESS:    Using the modified Seldinger technique, left radial arterial access was obtained with placement of a 6 Fr sheath in the left radial artery using US  guidance.   A pigtail diagnostic catheter was passed through the radial arterial sheath under fluoroscopic guidance into the aortic root. Aortic root angiography was performed in order to determine the optimal angiographic angle for valve deployment.   TRANSFEMORAL ACCESS:   Percutaneous transfemoral access and sheath placement was performed using ultrasound guidance.  The right common femoral artery was cannulated using a micropuncture needle and appropriate location was verified using hand injection angiogram.  A pair of Abbott Perclose percutaneous closure devices were placed and a 6 French sheath  replaced into the femoral artery.  The patient was heparinized systemically and ACT verified > 250 seconds.    A 14 Fr transfemoral E-sheath was introduced into the right common femoral artery after progressively dilating over an Amplatz superstiff wire. An AL-1 catheter was used to direct a straight-tip exchange length wire across the native aortic valve into the left ventricle. This was exchanged out for a pigtail catheter and position was confirmed in the LV apex. Simultaneous LV and Ao pressures were recorded.  The pigtail catheter was exchanged for a Safari wire in the LV apex. Direct LV pacing threshold through the [facility] wire was tested and was satisfactory.  BALLOON AORTIC VALVULOPLASTY:   Not performed    TRANSCATHETER HEART VALVE DEPLOYMENT:   An Edwards Sapien 3 Ultra transcatheter heart valve (size 26 mm) was prepared and crimped per manufacturer's guidelines, and the proper orientation of the valve is confirmed on the [employer] delivery system. The valve was advanced through the introducer sheath using normal technique until in an appropriate position in the abdominal aorta beyond the sheath tip. The balloon was then retracted and using the fine-tuning wheel was centered on the valve. The valve was then advanced across the aortic arch using appropriate flexion of the catheter. The valve was carefully positioned across the aortic valve annulus. The Commander catheter was retracted using normal technique. Once final position of the valve has been confirmed by angiographic assessment, the valve is deployed during rapid ventricular pacing to maintain systolic blood pressure < 50 mmHg and pulse pressure < 10 mmHg. The balloon inflation is held for >3 seconds after reaching full deployment volume. Once the balloon has fully deflated the balloon is retracted into the ascending aorta and valve function is assessed using  echocardiography. There is felt to be no paravalvular leak and no central  aortic insufficiency.  The patient's hemodynamic recovery following valve deployment is good.  The deployment balloon and guidewire are both removed.    PROCEDURE COMPLETION:   The sheath was removed and femoral artery closure performed.  Protamine was administered once femoral arterial repair was complete. The temporary pacemaker, pigtail catheter and femoral sheaths were removed with manual pressure used for venous hemostasis. A TR band was utilized following removal of the diagnostic sheath in the left radial artery.  The patient tolerated the procedure well and is transported to the cath lab recovery area in stable condition. There were no immediate intraoperative complications. All sponge instrument and needle counts are verified correct at completion of the operation.   No blood products were administered during the operation.  The patient received a total of 60 mL of intravenous contrast during the procedure.   Dorise MARLA Fellers, MD 05/22/2024

## 2024-05-22 NOTE — Interval H&P Note (Signed)
 History and Physical Interval Note:  05/22/2024 7:05 AM  Christie Moreno  has presented today for surgery, with the diagnosis of Severe Aortic Stenosis.  The various methods of treatment have been discussed with the patient and family. After consideration of risks, benefits and other options for treatment, the patient has consented to  Procedure(s): Transcatheter Aortic Valve Replacement, Transfemoral (Right) ECHOCARDIOGRAM, TRANSTHORACIC (N/A) as a surgical intervention.  The patient's history has been reviewed, patient examined, no change in status, stable for surgery.  I have reviewed the patient's chart and labs.  Questions were answered to the patient's satisfaction.     Christie Moreno

## 2024-05-22 NOTE — Anesthesia Postprocedure Evaluation (Signed)
 Anesthesia Post Note  Patient: SAPHIA VANDERFORD  Procedure(s) Performed: Transcatheter Aortic Valve Replacement, Transfemoral (Right) ECHOCARDIOGRAM, TRANSTHORACIC     Patient location during evaluation: PACU Anesthesia Type: MAC Level of consciousness: awake and alert Pain management: pain level controlled Vital Signs Assessment: post-procedure vital signs reviewed and stable Respiratory status: spontaneous breathing, nonlabored ventilation, respiratory function stable and patient connected to nasal cannula oxygen Cardiovascular status: blood pressure returned to baseline and stable Postop Assessment: no apparent nausea or vomiting Anesthetic complications: no   There were no known notable events for this encounter.  Last Vitals:  Vitals:   05/22/24 1316 05/22/24 1345  BP: (!) 151/64 (!) 153/95  Pulse: (!) 50 (!) 51  Resp: 18 18  Temp: 36.6 C   SpO2: 90% 94%    Last Pain:  Vitals:   05/22/24 1316  TempSrc: Oral  PainSc:                  Lynwood MARLA Cornea

## 2024-05-22 NOTE — Progress Notes (Signed)
 Izetta Hummer, PA notified of post op K level and H&H.

## 2024-05-22 NOTE — Progress Notes (Signed)
 Patient received from Cathlab, she was alert, CCMD notified, V/S obtained, CHG bath given. She has some old drainage on her right groin gauze dressing, marked.  Left hand has TR band with 12cc of air, hand was purple, patient had some heat pack on her hand, reverse barbeu; C, 1cc air removed while receiving the patient, waveform and hand color looks  better. Will continue to monitor.    05/22/24 1309  Vitals  BP (!) 153/89  MAP (mmHg) 104  BP Location Right Arm  BP Method Automatic  Patient Position (if appropriate) Lying  Pulse Rate (!) 49  Pulse Rate Source Monitor  ECG Heart Rate (!) 54  Resp 18  Level of Consciousness  Level of Consciousness Alert  MEWS COLOR  MEWS Score Color Green  Oxygen Therapy  SpO2 91 %  O2 Device Room Air  Pain Assessment  Pain Scale 0-10  Pain Score 0  MEWS Score  MEWS Temp 0  MEWS Systolic 0  MEWS Pulse 0  MEWS RR 0  MEWS LOC 0  MEWS Score 0

## 2024-05-22 NOTE — Discharge Instructions (Signed)
 After Your Pacemaker   You have a Environmental education officer  If you have a Medtronic or Biotronik device, plug in your home monitor once you get home, and no manual interaction is required.   If you have an [employer] or [employer] device, plug your home monitor once you get home, sit near the device, and press the large activation button. Sit nearby until the process is complete, usually notated by lights on the monitor.   If you were set up for monitoring using an app on your phone, make sure the app remains open in the background and the Bluetooth remains on.  ACTIVITY Do not lift your arm above shoulder height for 1 week after your procedure. After 7 days, you may progress as below.  You should remove your sling 24 hours after your procedure, unless otherwise instructed by your provider.     Friday June 01, 2024  Saturday June 02, 2024 Sunday June 03, 2024 Monday June 04, 2024   Do not lift, push, pull, or carry anything over 10 pounds with the affected arm until 6 weeks (Friday July 06, 2024 ) after your procedure.   You may drive AFTER your wound check, unless you have been told otherwise by your provider.   Ask your healthcare provider when you can go back to work   INCISION/Dressing If you are on a blood thinner such as Coumadin, Xarelto, Eliquis, Plavix , or Pradaxa please confirm with your provider when this should be resumed. Resume Plavix  on 05/26/24  If large square, outer bandage is left in place, this can be removed after 24 hours from your procedure. Do not remove steri-strips or glue as below.   If a PRESSURE DRESSING (a bulky dressing that usually goes up over your shoulder) was applied or left in place, please follow instructions given by your provider on when to return to have this removed.   Monitor your Pacemaker site for redness, swelling, and drainage. Call the device clinic at [phone] if you experience these symptoms or  fever/chills.  If your incision is sealed with Steri-strips or staples, you may shower 7 days after your procedure or when told by your provider. Do not remove the steri-strips or let the shower hit directly on your site. You may wash around your site with soap and water .    If you were discharged in a sling, please do not wear this during the day more than 48 hours after your surgery unless otherwise instructed. This may increase the risk of stiffness and soreness in your shoulder.   Avoid lotions, ointments, or perfumes over your incision until it is well-healed.  You may use a hot tub or a pool AFTER your wound check appointment if the incision is completely closed.  Pacemaker Alerts:  Some alerts are vibratory and others beep. These are NOT emergencies. Please call our office to let us  know. If this occurs at night or on weekends, it can wait until the next business day. Send a remote transmission.  If your device is capable of reading fluid status (for heart failure), you will be offered monthly monitoring to review this with you.   DEVICE MANAGEMENT Remote monitoring is used to monitor your pacemaker from home. This monitoring is scheduled every 91 days by our office. It allows us  to keep an eye on the functioning of your device to ensure it is working properly. You will routinely see your Electrophysiologist annually (more often if necessary).  This will appear as a  REMOTE check on your MyChart schedule. These are automatic and there is nothing for you to manually do unless otherwise instructed.  You should receive your ID card for your new device in 4-8 weeks. Keep this card with you at all times once received. Consider wearing a medical alert bracelet or necklace.  Your Pacemaker may be MRI compatible. This will be discussed at your next office visit/wound check.  You should avoid contact with strong electric or magnetic fields.   Do not use amateur (ham) radio equipment or electric  (arc) welding torches. MP3 player headphones with magnets should not be used. Some devices are safe to use if held at least 12 inches (30 cm) from your Pacemaker. These include power tools, lawn mowers, and speakers. If you are unsure if something is safe to use, ask your health care provider.  When using your cell phone, hold it to the ear that is on the opposite side from the Pacemaker. Do not leave your cell phone in a pocket over the Pacemaker.  You may safely use electric blankets, heating pads, computers, and microwave ovens.  Call the office right away if: You have chest pain. You feel more short of breath than you have felt before. You feel more light-headed than you have felt before. Your incision starts to open up.  This information is not intended to replace advice given to you by your health care provider. Make sure you discuss any questions you have with your health care provider.   ACTIVITY AND EXERCISE  Daily activity and exercise are an important part of your recovery. People recover at different rates depending on their general health and type of valve procedure.  Most people recovering from TAVR feel better relatively quickly   No lifting, pushing, pulling more than 10 pounds (examples to avoid: groceries, vacuuming, gardening, golfing):             - For one week with a procedure through the groin.             - For six weeks for procedures through the chest wall or neck. NOTE: You will typically see one of our providers 7-14 days after your procedure to discuss WHEN TO RESUME the above activities.      DRIVING  Do not drive until you are seen for follow up and cleared by a provider. Generally, we ask patient to not drive for 1 week after their procedure.  If you have been told by your doctor in the past that you may not drive, you must talk with him/her before you begin driving again.   DRESSING  Groin site: you may leave the clear dressing over the site until follow  up or remove 3-5 days post procedure.   HYGIENE  If you had a femoral (leg) procedure, you may take a shower when you return home. After the shower, pat the site dry. Do NOT use powder, oils or lotions in your groin area until the site has completely healed.   If you had a chest procedure, you may shower when you return home unless specifically instructed not to by your discharging practitioner.             - DO NOT scrub incision; pat dry with a towel.             - DO NOT apply any lotions, oils, powders to the incision.             - No tub baths /  swimming for at least 2 weeks.  If you notice any fevers, chills, increased pain, swelling, bleeding or pus, please contact your provider.   ADDITIONAL INFORMATION  If you are going to have an upcoming dental procedure, please contact our office as you will require antibiotics ahead of time to prevent infection on your heart valve.    If you have any questions or concerns you can call the structural heart phone during normal business hours 8am-4pm. If you have an urgent need after hours or weekends please call [phone] to talk to the on call provider for general cardiology. If you have an emergency that requires immediate attention, please call 911.    After TAVR Checklist  Check  Test Description   Follow up appointment in 1-2 weeks  You will see our structural heart advanced practice provider. Your incision sites will be checked and you will be cleared to drive and resume all normal activities if you are doing well.     1 month echo and follow up (23-75 days) You will have an echo to check on your new heart valve and be seen back in the office by a structural heart advanced practice provider.   Follow up with your primary cardiologist You will need to be seen by your primary cardiologist in the following 3-6 months after your 1 month appointment in the valve clinic.    1 year echo and follow up (60 days +/- the anniversary of your TAVR) You  will have another echo to check on your heart valve after 1 year and be seen back in the office by a structural heart advanced practice provider. This your last structural heart visit.   Bacterial endocarditis prophylaxis  You will have to take antibiotics for the rest of your life before all dental procedures (even teeth cleanings) to protect your heart valve. Antibiotics are also required before some surgeries. Please check with your cardiologist before scheduling any surgeries. Also, please make sure to tell us  if you have a penicillin allergy as you will require an alternative antibiotic.

## 2024-05-22 NOTE — Discharge Summary (Incomplete)
 [facility]   MULTIDISCIPLINARY HEART VALVE TEAM  Discharge Summary    Patient ID: Christie Moreno MRN: [id number]; DOB: 05/06/1956  Admit date: 05/22/2024 Discharge date: 05/25/2024  PCP:  Gayle Saddie FALCON, PA-C  [facility] Cardiologist:  Dorn Lesches, MD  [facility] Structural heart: Lurena MARLA Red, MD [facility] Electrophysiologist:  Donnice DELENA Primus, MD   Discharge Diagnoses    Principal Problem:   S/P TAVR (transcatheter aortic valve replacement) Active Problems:   Seizure disorder (HCC)   Tobacco dependence   Protein-calorie malnutrition, severe   Aortic stenosis   Hyperlipidemia   Hypothyroidism   Raynaud disease   COPD with acute exacerbation (HCC)   (HFpEF) heart failure with preserved ejection fraction (HCC)   Peripheral vascular disease   Allergies Allergies  Allergen Reactions   Jardiance  [Empagliflozin ]     UTIs   Nickel Itching    Ears itch when wearing earings made of nickel   Azithromycin Nausea And Vomiting   Penicillins Itching and Rash   Percocet [Oxycodone-Acetaminophen ] Rash    Diagnostic Studies/Procedures    [facility]  TAVR OPERATIVE NOTE     Date of Procedure:                05/22/2024   Preoperative Diagnosis:      Severe Aortic Stenosis    Postoperative Diagnosis:    Same    Procedure:        Transcatheter Aortic Valve Replacement - Transfemoral Approach             Edwards Sapien 3 Resilia THV (size 26 mm, model # 9755RLS)              Co-Surgeons:                        Dorise LOIS Fellers, MD and Lurena Red, MD Anesthesiologist:                  Keneth   Echocardiographer:              Croitoru   Pre-operative Echo Findings: Severe aortic stenosis Normal left ventricular systolic function   Post-operative Echo Findings: No paravalvular leak Normal left ventricular systolic function   Left Heart Catheterization Findings: Left ventricular end-diastolic pressure of   _____________    Echo 05/23/24:  IMPRESSIONS   1. Left ventricular ejection fraction, by estimation, is 60 to 65%. The  left ventricle has normal function. The left ventricle has no regional  wall motion abnormalities. There is mild concentric left ventricular  hypertrophy. Left ventricular diastolic  function could not be evaluated.   2. Right ventricular systolic function is normal. The right ventricular  size is normal. There is normal pulmonary artery systolic pressure.   3. The mitral valve is degenerative. Trivial mitral valve regurgitation.  Mild mitral stenosis. The mean mitral valve gradient is 4.0 mmHg. Severe  mitral annular calcification.   4. The aortic valve has been repaired/replaced. Aortic valve  regurgitation is not visualized. Procedure Date: 05/22/24. Echo findings  are consistent with normal structure and function of the aortic valve  prosthesis. Aortic valve area, by VTI measures  1.41 cm. Aortic valve mean gradient measures 4.8 mmHg. Aortic valve Vmax  measures 1.64 m/s.   5. The inferior vena cava is normal in size with <50% respiratory  variability, suggesting right atrial pressure of 8 mmHg.   _____________  PPM Implant 05/24/24 Summary: 1.  Successful implant of a LEFT sided BSCI DC/LBBAP PPM with selective LB capture   Recommendations: 1.Routine post-procedure care with bedrest for 3 hours 2.No heparin  (IV or subcutaneous) for 48 hours. No enoxaparin  (IV or subcutaneous) for 7 days.  3.PA/lateral CXR in AM 4.Wound check in AM 5.Device interrogation in AM 6.NPO at midnight    History of Present Illness     Christie Moreno is a 68 y.o. female with a history of CAD, HLD, NICM with improvement on medical therapy, hypothyroidism, tobacco abuse, PVD, seizure disorder and paradoxical low-flow low gradient AS who presented to [facility] on 05/22/24 for planned TAVR.   She has a history of mesenteric ischemia s/p SMA balloon angioplasty and stenting  in 07/2023 and left lower extremity ischemia due to occlusion of left common femoral and external iliac arteries. Echocardiogram in April 2023 showed an ejection fraction of 40 to 45% with a calcified aortic valve with a low mean gradient of 3 mmHg and a valve area by VTI of 2.0 cm. Cardiac catheterization in April 2023 showed moderate stenoses in multiple coronary branches and GDMT was recommended. Her most recent echocardiogram on 03/12/2024 showed EF 55% and severe AS with mean grad 18.0 mmHg, Vmax 2.70 m/s, AVA 0.83 cm2, DVI 0.28, SVI 37, Mild MS, Mild-mod AI. She underwent repeat cardiac catheterization on 04/09/2024 showing mild to moderate nonobstructive coronary disease that was not significantly changed compared to the study in 2023 and continue medical treatment was recommended.   The patient was evaluated by the multidisciplinary valve team and felt to have severe, symptomatic aortic stenosis and to be a suitable candidate for TAVR, which was set up for 05/22/24.   Hospital Course     Consultants: EP   Severe AS:  -- S/p successful TAVR with a 26 mm Edwards Sapien 3 Ultra Resilia THV via the TF approach on 05/22/24.  -- Post operative echo showed EF 60%, mild LVH, severe MAC with mid MS, and normally functioning TAVR with a mean gradient of 4.8 mmHg and no PVL. -- Groin sites are stable.  -- Continued on home Asprin and Plavix . -- Met with cardiac rehab to discuss CRP phase II.  -- Plan for discharge home today with close follow up in the outpatient setting.   CHB: -- Pt had underlying sinus with left posterior fascicular block. -- Post op ECG after TAVR showed bradycardia with a new ILBBB and mild 1st degree AV block.  -- She later developed 2:1 AV block with ventricular rates in the 30s that progressed to CHB with intermittent loss of a ventricular escape rhythm.  -- Internal jugular temp wire emergently placed 05/23/24. -- S/p LEFT sided BSCI DC/LBBAP PPM with selective LB capture on  05/24/24 by Dr. Almetta.  -- Post op CXR and device function okay.  -- Hold Plavix  until 05/26/24.    PAD: -- Mesenteric ischemia s/p SMA balloon angioplasty and stenting in 07/2023. -- Patient may require surgical revascularization for her left lower extremity  -- No rest pain.  -- Seen by Dr. Pearline on 05/11/24 and plans to see back in 3 months after TAVR. -- Continue home Asprin and Plavix .  -- Continue atorvastatin  80mg  daily.    CAD: -- Cardiac catheterization on 04/09/2024 showing mild to moderate nonobstructive coronary disease that was not significantly changed compared to the study in 2023 and continued medical treatment was recommended.  -- Continue Asprin and Plavix .  -- Continue atorvastatin  80mg  daily.    Tobacco abuse: -- Counseled  on cessation.   HTN: -- BPs have been elevated.  -- Resume home coreg  but increase dose from 3.125mg  BID to 6.25mg  BID. -- Resume home Lasix  20mg  daily.   HFimpEF: -- EF 60%. -- Appears euvolemic. -- Continue Coreg  (dose increased as above) and Lasix  20mg  daily and Kdur 20meq daily.    _____________  Discharge Vitals Blood pressure (!) 174/90, pulse 87, temperature (!) 97.4 F (36.3 C), temperature source Axillary, resp. rate 20, height 5' 1 (1.549 m), weight 42 kg, SpO2 94%.  Filed Weights   05/22/24 0723 05/23/24 0248 05/24/24 0400  Weight: 37.6 kg 41.8 kg 42 kg   GEN: thin and elderly  HEENT: Grossly normal.  Neck: Supple, no JVD or masses. Cardiac: RRR, murmur @ apex, but heard throughout precordium. No rubs, or gallops. No clubbing, cyanosis, edema.   Respiratory:  Respirations regular and unlabored, clear to auscultation bilaterally. GI: Soft, nontender, nondistended, BS + x 4. Skin: warm and dry, no rash. Chronic venous stasis changes. Groin sites clear without hematoma or ecchymosis  Neuro:  Strength and sensation are intact. Psych: AAOx3.  Normal affect.  Disposition   Pt is being discharged home today in good  condition.  Follow-up Plans & Appointments     Follow-up Information     Sebastian Lamarr SAUNDERS, PA-C. Go on 05/28/2024.   Specialties: Cardiology, Radiology Why: @ 9:30am, please arrive at least 20 minutes ealy. Contact information: [address] [place] [address] [phone]                Discharge Instructions     Amb Referral to Cardiac Rehabilitation   Complete by: As directed    Diagnosis: Valve Replacement   Valve: Aortic Comment - tavr   After initial evaluation and assessments completed: Virtual Based Care may be provided alone or in conjunction with Phase 2 Cardiac Rehab based on patient barriers.: Yes   Intensive Cardiac Rehabilitation (ICR) [facility] location only OR Traditional Cardiac Rehabilitation (TCR) *If criteria for ICR are not met will enroll in TCR [facility] only): Yes       Discharge Medications   Allergies as of 05/25/2024       Reactions   Jardiance  [empagliflozin ]    UTIs   Nickel Itching   Ears itch when wearing earings made of nickel   Azithromycin Nausea And Vomiting   Penicillins Itching, Rash   Percocet [oxycodone-acetaminophen ] Rash        Medication List     PAUSE taking these medications    clopidogrel  75 MG tablet Wait to take this until: May 26, 2024 Commonly known as: PLAVIX  Take 1 tablet (75 mg total) by mouth daily.       TAKE these medications    albuterol  (2.5 MG/3ML) 0.083% nebulizer solution Commonly known as: PROVENTIL  Take 3 mLs (2.5 mg total) by nebulization every 6 (six) hours as needed for wheezing or shortness of breath.   albuterol  108 (90 Base) MCG/ACT inhaler Commonly known as: VENTOLIN  HFA Inhale 2 puffs into the lungs every 4 (four) hours as needed for wheezing or shortness of breath.   Aspirin  Low Dose 81 MG tablet Generic drug: aspirin  EC Take 1 tablet (81 mg total) by mouth daily. Swallow whole.   atorvastatin  80 MG tablet Commonly known as: LIPITOR  Take 1 tablet (80 mg total)  by mouth daily at 6 PM.   BENEFIBER PO Take 1 Dose by mouth daily.   Breztri  Aerosphere 160-9-4.8 MCG/ACT Aero inhaler Generic drug: budesonide -glycopyrrolate-formoterol Inhale 2 puffs  into the lungs 2 (two) times daily. What changed:  when to take this reasons to take this   carvedilol  6.25 MG tablet Commonly known as: COREG  TAKE 1 TABLET(3.125 MG) BY MOUTH TWICE DAILY WITH A MEAL What changed: medication strength   estradiol 0.01 % Crea vaginal cream Commonly known as: ESTRACE Place 1 Applicatorful vaginally at bedtime.   estrogens (conjugated) 1.25 MG tablet Commonly known as: PREMARIN Take 1.25 mg by mouth daily.   feeding supplement Liqd Take 237 mLs by mouth 2 (two) times daily between meals.   furosemide  40 MG tablet Commonly known as: LASIX  Take 0.5 tablets (20 mg total) by mouth daily. TAKE ONE-HALF (1/2) TABLET (20 MG TOTAL) DAILY   lamoTRIgine  100 MG tablet Commonly known as: LAMICTAL  Take 1 tablet (100 mg total) by mouth 2 (two) times daily.   levothyroxine  25 MCG tablet Commonly known as: SYNTHROID  Take 1 tablet (25 mcg total) by mouth daily before breakfast.   potassium chloride  SA 20 MEQ tablet Commonly known as: KLOR-CON  M TAKE 1 TABLET DAILY (KEEP UPCOMING APPOINTMENT FOR FUTURE REFILLS)   progesterone 200 MG capsule Commonly known as: PROMETRIUM Take 200 mg by mouth at bedtime.         Outstanding Labs/Studies   none ______________________  Duration of Discharge Encounter: APP Time: 25 minutes    Signed, Lamarr Hummer, PA-C 05/25/2024, 10:14 AM [phone]  ATTENDING ATTESTATION:  After conducting a review of all available clinical information with the care team, interviewing the patient, and performing a physical exam, I agree with the findings and plan described in this note.   GEN: No acute distress, AO x 3 HEENT:  MMM, no JVD, no scleral icterus; right IJ sheath in place Cardiac: RRR, no murmurs, rubs, or gallops.   Respiratory: Clear to auscultation bilaterally. GI: Soft, nontender, non-distended  MS: No edema; No deformity. Neuro:  Nonfocal  Vasc: Intact right common femoral artery access site, intact left radial access site  Patient doing well after TAVR complicated by complete heart block.  She underwent pacemaker placement yesterday.  She has moderate bruising of her left upper chest around her pacemaker site.  She is otherwise well.  Her echocardiogram following TAVR was reassuring.  Discharge today on aspirin  and Plavix  with close hospital follow-up.  APP discharge time:25 MD discharge time:28  Lurena Red, MD Pager [phone]

## 2024-05-22 NOTE — Anesthesia Preprocedure Evaluation (Addendum)
 Anesthesia Evaluation  Patient identified by MRN, date of birth, ID band Patient awake    Reviewed: Allergy & Precautions, NPO status , Patient's Chart, lab work & pertinent test results, reviewed documented beta blocker date and time   History of Anesthesia Complications Negative for: history of anesthetic complications  Airway Mallampati: I  TM Distance: >3 FB     Dental  (+) Upper Dentures, Lower Dentures   Pulmonary pneumonia, COPD, Current Smoker and Patient abstained from smoking.   breath sounds clear to auscultation       Cardiovascular hypertension, + CAD, + Peripheral Vascular Disease and +CHF  + Valvular Problems/Murmurs AS  Rhythm:Regular Rate:Normal + Systolic murmurs IMPRESSIONS     1. Left ventricular ejection fraction, by estimation, is 55 to 60%. The  left ventricle has normal function. The left ventricle has no regional  wall motion abnormalities. Left ventricular diastolic parameters are  consistent with Grade I diastolic  dysfunction (impaired relaxation).   2. Right ventricular systolic function is normal. The right ventricular  size is normal. There is mildly elevated pulmonary artery systolic  pressure. The estimated right ventricular systolic pressure is 38.9 mmHg.   3. Left atrial size was mild to moderately dilated.   4. The mitral valve is degenerative. Trivial mitral valve regurgitation.  Mild mitral stenosis. The mean mitral valve gradient is 3.0 mmHg. Moderate  to severe mitral annular calcification.   5. The aortic valve is tricuspid. There is severe calcifcation of the  aortic valve. Aortic valve regurgitation is mild to moderate. Moderate to  severe paradoxical low flow/low gradient aortic valve stenosis. Aortic  valve area, by VTI measures 0.83 cm.  Aortic valve mean gradient measures 18.0 mmHg.   6. The inferior vena cava is normal in size with <50% respiratory  variability, suggesting  right atrial pressure of 8 mmHg.      Neuro/Psych Seizures -, Well Controlled,  PSYCHIATRIC DISORDERS Anxiety        GI/Hepatic ,,,(+) neg Cirrhosis        Endo/Other  Hypothyroidism    Renal/GU Renal disease     Musculoskeletal   Abdominal   Peds  Hematology   Anesthesia Other Findings   Reproductive/Obstetrics                              Anesthesia Physical Anesthesia Plan  ASA: 4  Anesthesia Plan: MAC   Post-op Pain Management:    Induction: Intravenous  PONV Risk Score and Plan: 1 and Ondansetron  and Propofol infusion  Airway Management Planned: Natural Airway and Simple Face Mask  Additional Equipment:   Intra-op Plan:   Post-operative Plan: Extubation in OR  Informed Consent: I have reviewed the patients History and Physical, chart, labs and discussed the procedure including the risks, benefits and alternatives for the proposed anesthesia with the patient or authorized representative who has indicated his/her understanding and acceptance.     Dental advisory given  Plan Discussed with: CRNA  Anesthesia Plan Comments:          Anesthesia Quick Evaluation

## 2024-05-22 NOTE — Progress Notes (Signed)
 Patient had some slow oozing from right groin vascular site, no any s/s of hematoma noticed, informed to PA.

## 2024-05-22 NOTE — Progress Notes (Signed)
  [facility]   MULTIDISCIPLINARY HEART VALVE TEAM  Patient doing well s/p TAVR. She is hemodynamically stable. Groin sites stable. ECG with sinus brady with QRS widening, but no high grade block. Transferred from cath lab holding to 4E. Called to bedside for persistent groin oozing. Treated with 4 ccs of lido/epi with good hemostasis. Incision redressed. K 3.3. Will treat with one dose of potassium 40 meq. Early ambulation after bedrest completed and hopeful discharge over the next 24-48 hours.   Lamarr Hummer PA-C  [facility]  Pager [phone]

## 2024-05-22 NOTE — Transfer of Care (Signed)
 Immediate Anesthesia Transfer of Care Note  Patient: Christie Moreno  Procedure(s) Performed: Transcatheter Aortic Valve Replacement, Transfemoral (Right) ECHOCARDIOGRAM, TRANSTHORACIC  Patient Location: PACU  Anesthesia Type:MAC  Level of Consciousness: awake, alert , patient cooperative, and responds to stimulation  Airway & Oxygen Therapy: Patient Spontanous Breathing and Patient connected to nasal cannula oxygen  Post-op Assessment: Report given to RN and Post -op Vital signs reviewed and stable  Post vital signs: Reviewed and stable  Last Vitals:  Vitals Value Taken Time  BP 127/60 05/22/24 11:48  Temp    Pulse 49 05/22/24 11:48  Resp 17 05/22/24 11:48  SpO2 92 % 05/22/24 11:48  Vitals shown include unfiled device data. spO2 on right thumb r/t TR band      Complications: There were no known notable events for this encounter.

## 2024-05-23 ENCOUNTER — Encounter (HOSPITAL_COMMUNITY): Payer: Self-pay | Admitting: Internal Medicine

## 2024-05-23 ENCOUNTER — Encounter (HOSPITAL_COMMUNITY)
Admission: RE | Disposition: A | Discharge status: Home / Self Care | Payer: Self-pay | Source: Home / Self Care | Attending: Internal Medicine

## 2024-05-23 ENCOUNTER — Inpatient Hospital Stay (HOSPITAL_COMMUNITY)

## 2024-05-23 DIAGNOSIS — I739 Peripheral vascular disease, unspecified: Secondary | ICD-10-CM

## 2024-05-23 DIAGNOSIS — I350 Nonrheumatic aortic (valve) stenosis: Secondary | ICD-10-CM

## 2024-05-23 DIAGNOSIS — I442 Atrioventricular block, complete: Secondary | ICD-10-CM | POA: Diagnosis not present

## 2024-05-23 DIAGNOSIS — Z952 Presence of prosthetic heart valve: Secondary | ICD-10-CM

## 2024-05-23 HISTORY — PX: TEMPORARY PACEMAKER: CATH118268

## 2024-05-23 LAB — CBC
HCT: 35.3 % — ABNORMAL LOW (ref 36.0–46.0)
Hemoglobin: 11 g/dL — ABNORMAL LOW (ref 12.0–15.0)
MCH: 22.7 pg — ABNORMAL LOW (ref 26.0–34.0)
MCHC: 31.2 g/dL (ref 30.0–36.0)
MCV: 72.8 fL — ABNORMAL LOW (ref 80.0–100.0)
Platelets: 162 K/uL (ref 150–400)
RBC: 4.85 MIL/uL (ref 3.87–5.11)
RDW: 22.8 % — ABNORMAL HIGH (ref 11.5–15.5)
WBC: 10.2 K/uL (ref 4.0–10.5)
nRBC: 0 % (ref 0.0–0.2)

## 2024-05-23 LAB — ECHOCARDIOGRAM COMPLETE
AR max vel: 1.41 cm2
AV Area VTI: 1.41 cm2
AV Area mean vel: 1.5 cm2
AV Mean grad: 4.8 mmHg
AV Peak grad: 10.7 mmHg
Ao pk vel: 1.64 m/s
Area-P 1/2: 2.07 cm2
Calc EF: 60.1 %
Height: 61 in
S' Lateral: 2.2 cm
Single Plane A2C EF: 64.6 %
Single Plane A4C EF: 62.9 %
Weight: 1474.44 [oz_av]

## 2024-05-23 LAB — BASIC METABOLIC PANEL WITH GFR
Anion gap: 11 (ref 5–15)
BUN: 11 mg/dL (ref 8–23)
CO2: 23 mmol/L (ref 22–32)
Calcium: 8.3 mg/dL — ABNORMAL LOW (ref 8.9–10.3)
Chloride: 101 mmol/L (ref 98–111)
Creatinine, Ser: 0.77 mg/dL (ref 0.44–1.00)
GFR, Estimated: >60 mL/min (ref >60)
Glucose, Bld: 111 mg/dL — ABNORMAL HIGH (ref 70–99)
Potassium: 3.7 mmol/L (ref 3.5–5.1)
Sodium: 135 mmol/L (ref 135–145)

## 2024-05-23 LAB — GLUCOSE, CAPILLARY: Glucose-Capillary: 103 mg/dL — ABNORMAL HIGH (ref 70–99)

## 2024-05-23 LAB — MAGNESIUM: Magnesium: 1.8 mg/dL (ref 1.7–2.4)

## 2024-05-23 SURGERY — TEMPORARY PACEMAKER
Anesthesia: LOCAL

## 2024-05-23 MED ORDER — SODIUM CHLORIDE 0.9% FLUSH: 3.0000 mL | INTRAVENOUS | Status: DC | PRN

## 2024-05-23 MED ORDER — CHLORHEXIDINE GLUCONATE CLOTH 2 % EX PADS
6.0000 | MEDICATED_PAD | Freq: Every day | CUTANEOUS | Status: DC
  Administered 2024-05-23 – 2024-05-25 (×3): 6 via TOPICAL

## 2024-05-23 MED ORDER — LIDOCAINE HCL (PF) 1 % IJ SOLN
INTRAMUSCULAR | Status: DC | PRN
  Administered 2024-05-23: 10 mL

## 2024-05-23 MED ORDER — SODIUM CHLORIDE 0.9 % IV SOLN
INTRAVENOUS | Status: AC | PRN
  Administered 2024-05-23: 10 mL/h via INTRAVENOUS

## 2024-05-23 MED ORDER — LORAZEPAM 2 MG/ML IJ SOLN
INTRAMUSCULAR | Status: AC
  Administered 2024-05-23: 0.5 mg
  Filled 2024-05-23: qty 1

## 2024-05-23 MED ORDER — DOPAMINE-DEXTROSE 3.2-5 MG/ML-% IV SOLN
2.5000 ug/kg/min | INTRAVENOUS | Status: DC
  Filled 2024-05-23: qty 250

## 2024-05-23 MED ORDER — MAGNESIUM SULFATE 2 GM/50ML IV SOLN
2.0000 g | Freq: Once | INTRAVENOUS | Status: AC
  Administered 2024-05-23: 2 g via INTRAVENOUS
  Filled 2024-05-23: qty 50

## 2024-05-23 MED ORDER — LIDOCAINE HCL (PF) 1 % IJ SOLN
INTRAMUSCULAR | Status: AC
  Filled 2024-05-23: qty 30

## 2024-05-23 MED ORDER — SODIUM CHLORIDE 0.9 % IV SOLN: 250.0000 mL | INTRAVENOUS | Status: AC | PRN

## 2024-05-23 MED ORDER — HEPARIN (PORCINE) IN NACL 1000-0.9 UT/500ML-% IV SOLN
INTRAVENOUS | Status: DC | PRN
  Administered 2024-05-23: 500 mL

## 2024-05-23 MED ORDER — ORAL CARE MOUTH RINSE: 15.0000 mL | OROMUCOSAL | Status: DC | PRN

## 2024-05-23 SURGICAL SUPPLY — 11 items
CABLE ADAPT PACING TEMP 12FT (ADAPTER) IMPLANT
GLIDESHEATH SLEND A-KIT 6F 22G (SHEATH) IMPLANT
GUIDEWIRE INQWIRE 1.5J.035X260 (WIRE) IMPLANT
KIT ENCORE 26 ADVANTAGE (KITS) IMPLANT
KIT HEMO VALVE WATCHDOG (MISCELLANEOUS) IMPLANT
PACK CARDIAC CATHETERIZATION (CUSTOM PROCEDURE TRAY) ×1 IMPLANT
SET ATX-X65L (MISCELLANEOUS) IMPLANT
SHEATH PINNACLE 6F 10CM (SHEATH) IMPLANT
SHEATH PROBE COVER 6X72 (BAG) IMPLANT
WIRE MICRO SET SILHO 5FR 7 (SHEATH) IMPLANT
WIRE PACING TEMP ST TIP 5 (CATHETERS) IMPLANT

## 2024-05-23 NOTE — Consult Note (Addendum)
 ELECTROPHYSIOLOGY CONSULT NOTE    Patient ID: Christie Moreno MRN: [id number], DOB/AGE: [birth year] 68 y.o.  Admit date: 05/22/2024 Date of Consult: 05/23/2024  Primary Physician: Gayle Saddie FALCON, PA-C Primary Cardiologist: Dorn Lesches, MD  Electrophysiologist: Dr. Inocencio   Referring Provider: Dr. Wendel  Patient Profile: Christie Moreno is a 68 y.o. female with a history of severe AS s/p TAVR, HFpEF, peripheral vascular disease, seizure disorder, tobacco use, hyperlipidemia, hypothyroidism, Raynaud disease, COPD who is being seen today for the evaluation of complete heart block s/p TAVR at the request of Dr. Wendel.  HPI:  Christie Moreno is a 68 y.o. female who presented for scheduled TAVR. Patient with successful TAVR on 10/21, received 26mm Edwards Sapien 3 Ultra Resilia THV. Immediately after implant, patient with AV dissociation but regained native conduction after a few minutes. Postoperatively, patient continued to recover well, had bradycardia with new LBBB, 1st degree AVB on ECG. Overnight cardiologist was notified of patient developing 2:1 AVB with HR in the 30s. This progressed to CHB with acute symptoms and patient was transcutaneously paced prior to STAT placement of transvenous pacemaker. This morning patient remains pacer dependent prompting call to EP.   On exam today patient is chronically ill appearing. She has multiple complaints including neck pain, foot cramping, headache, and general malaise.   Labs Potassium3.7 (10/22 9256)   Creatinine, ser  0.77 (10/22 0743) PLT  162 (10/22 0743) HGB  11.0* (10/22 0743) WBC 10.2 (10/22 0743)  .    Past Medical History:  Diagnosis Date   CHF (congestive heart failure) (HCC)    Complex partial seizure (HCC) 04/22/2015   Convulsions (HCC) 04/19/2013   COPD (chronic obstructive pulmonary disease) (HCC)    Heart failure (HCC)    Hyperlipidemia    Hypertension    Hypothyroidism    Peripheral vascular  disease    S/P TAVR (transcatheter aortic valve replacement) 05/22/2024   TAVR with a 26 mm Edwards Sapien 3 Ultra Resilia THV via the TF approach by Dr. Wendel and Dr. Lucas   Seizures [facility])    none in years, recently had medication change   Thyroid disease      Surgical History:  Past Surgical History:  Procedure Laterality Date   COLONOSCOPY     INTRAOPERATIVE TRANSTHORACIC ECHOCARDIOGRAM N/A 05/22/2024   Procedure: ECHOCARDIOGRAM, TRANSTHORACIC;  Surgeon: Wendel Lurena POUR, MD;  Location: [facility] INVASIVE CV LAB;  Service: Cardiovascular;  Laterality: N/A;   PERIPHERAL INTRAVASCULAR LITHOTRIPSY  07/25/2023   Procedure: PERIPHERAL INTRAVASCULAR LITHOTRIPSY;  Surgeon: Pearline Norman RAMAN, MD;  Location: [facility] INVASIVE CV LAB;  Service: Cardiovascular;;   PERIPHERAL VASCULAR INTERVENTION  07/25/2023   Procedure: PERIPHERAL VASCULAR INTERVENTION;  Surgeon: Pearline Norman RAMAN, MD;  Location: [facility] INVASIVE CV LAB;  Service: Cardiovascular;;   RIGHT HEART CATH AND CORONARY ANGIOGRAPHY N/A 04/09/2024   Procedure: RIGHT HEART CATH AND CORONARY ANGIOGRAPHY;  Surgeon: Wendel Lurena POUR, MD;  Location: [facility] INVASIVE CV LAB;  Service: Cardiovascular;  Laterality: N/A;   RIGHT/LEFT HEART CATH AND CORONARY ANGIOGRAPHY N/A 11/05/2021   Procedure: RIGHT/LEFT HEART CATH AND CORONARY ANGIOGRAPHY;  Surgeon: Claudene Victory ORN, MD;  Location: [facility] INVASIVE CV LAB;  Service: Cardiovascular;  Laterality: N/A;   TONSILLECTOMY     VISCERAL ANGIOGRAPHY N/A 07/25/2023   Procedure: VISCERAL ANGIOGRAPHY;  Surgeon: Pearline Norman RAMAN, MD;  Location: [facility] INVASIVE CV LAB;  Service: Cardiovascular;  Laterality: N/A;     Medications Prior to Admission  Medication Sig Dispense Refill Last Dose/Taking  albuterol  (PROVENTIL ) (2.5 MG/3ML) 0.083% nebulizer solution Take 3 mLs (2.5 mg total) by nebulization every 6 (six) hours as needed for wheezing or shortness of breath. 90 mL 12 Past Week   albuterol  (VENTOLIN  HFA) 108 (90 Base) MCG/ACT  inhaler Inhale 2 puffs into the lungs every 4 (four) hours as needed for wheezing or shortness of breath. 3 each 3 Past Week   aspirin  81 MG EC tablet Take 1 tablet (81 mg total) by mouth daily. Swallow whole. 30 tablet 0 05/21/2024   atorvastatin  (LIPITOR ) 80 MG tablet Take 1 tablet (80 mg total) by mouth daily at 6 PM. 90 tablet 1 05/21/2024   budeson-glycopyrrolate-formoterol (BREZTRI  AEROSPHERE) 160-9-4.8 MCG/ACT AERO inhaler Inhale 2 puffs into the lungs 2 (two) times daily. (Patient taking differently: Inhale 2 puffs into the lungs 2 (two) times daily as needed (shortness of breath).) 10.7 g 11 05/21/2024   carvedilol  (COREG ) 3.125 MG tablet TAKE 1 TABLET(3.125 MG) BY MOUTH TWICE DAILY WITH A MEAL 180 tablet 2 05/21/2024   clopidogrel  (PLAVIX ) 75 MG tablet Take 1 tablet (75 mg total) by mouth daily. 90 tablet 1 05/21/2024   estrogens, conjugated, (PREMARIN) 1.25 MG tablet Take 1.25 mg by mouth daily.   05/21/2024   feeding supplement (ENSURE ENLIVE / ENSURE PLUS) LIQD Take 237 mLs by mouth 2 (two) times daily between meals. 237 mL 0 Past Week   furosemide  (LASIX ) 40 MG tablet Take 0.5 tablets (20 mg total) by mouth daily. TAKE ONE-HALF (1/2) TABLET (20 MG TOTAL) DAILY 45 tablet 1 05/21/2024   lamoTRIgine  (LAMICTAL ) 100 MG tablet Take 1 tablet (100 mg total) by mouth 2 (two) times daily. 180 tablet 3 05/21/2024   levothyroxine  (SYNTHROID ) 25 MCG tablet Take 1 tablet (25 mcg total) by mouth daily before breakfast. 90 tablet 3 05/21/2024   potassium chloride  SA (KLOR-CON  M) 20 MEQ tablet TAKE 1 TABLET DAILY (KEEP UPCOMING APPOINTMENT FOR FUTURE REFILLS) 90 tablet 1 05/21/2024   progesterone (PROMETRIUM) 200 MG capsule Take 200 mg by mouth at bedtime.   05/21/2024   Wheat Dextrin (BENEFIBER PO) Take 1 Dose by mouth daily.   05/21/2024   estradiol (ESTRACE) 0.01 % CREA vaginal cream Place 1 Applicatorful vaginally at bedtime.       Inpatient Medications:   aspirin  EC  81 mg Oral Daily    atorvastatin   80 mg Oral q1800   Chlorhexidine Gluconate Cloth  6 each Topical Daily   clopidogrel   75 mg Oral Daily   estrogens (conjugated)  1.25 mg Oral Daily   feeding supplement  237 mL Oral BID BM   Influenza vac split trivalent PF  0.5 mL Intramuscular Tomorrow-1000   lamoTRIgine   100 mg Oral BID   levothyroxine   25 mcg Oral QAC breakfast   progesterone  200 mg Oral QHS   sodium chloride  flush  3 mL Intravenous Q12H    Allergies:  Allergies  Allergen Reactions   Jardiance  [Empagliflozin ]     UTIs   Nickel Itching    Ears itch when wearing earings made of nickel   Azithromycin Nausea And Vomiting   Penicillins Itching and Rash   Percocet [Oxycodone-Acetaminophen ] Rash    Family History  Adopted: Yes  Problem Relation Age of Onset   Breast cancer Mother    Diabetes Mother    Stroke Mother    Heart Problems Father    Diabetes Sister    Kidney failure Sister    Breast cancer Maternal Aunt    Lung cancer Half-Sister  Colon cancer Neg Hx    Esophageal cancer Neg Hx    Epilepsy Neg Hx    Stomach cancer Neg Hx    Rectal cancer Neg Hx      Physical Exam: Vitals:   05/23/24 0515 05/23/24 0530 05/23/24 0600 05/23/24 0630  BP: 139/73 119/75 134/67 122/72  Pulse: 69 69 69 69  Resp: (!) 23 19 (!) 21 (!) 22  Temp:      TempSrc:      SpO2: 92% 93% 93% 93%  Weight:      Height:        GEN- NAD, A&O x 3, normal affect HEENT: Normocephalic, atraumatic Neck: right internal jugular access pacing wire Lungs- faint upper lobe end-expiratory wheezing Heart- Regular rate and rhythm. Faint end-diastolic rub GI- Soft, NT, ND.  Extremities- No clubbing, cyanosis, or edema. Groin vascular access sites well appearing, no hematoma.   Radiology/Studies: CARDIAC CATHETERIZATION Result Date: 05/23/2024 05/23/2024: Successful placement of temporary transvenous pacemaker using right IJ, capture obtained at 0.7 mL, pacemaker settings at 70 bpm with 5 mL output, asynchronous.    ECHOCARDIOGRAM LIMITED Result Date: 05/22/2024    ECHOCARDIOGRAM LIMITED REPORT   Patient Name:   YETZALI WELD Date of Exam: 05/22/2024 Medical Rec #:  [id number]          Height:       61.0 in Accession #:    [id number]         Weight:       83.0 lb Date of Birth:  06-30-1956          BSA:          1.300 m Patient Age:    68 years           BP:           151/62 mmHg Patient Gender: F                  HR:           95 bpm. Exam Location:  Inpatient Procedure: Limited Echo, Color Doppler and Cardiac Doppler (Both Spectral and            Color Flow Doppler were utilized during procedure). Indications:     Aortic Stenosis i35.0  History:         Patient has prior history of Echocardiogram examinations, most                  recent 03/12/2024. COPD; Risk Factors:Hypertension and                  Dyslipidemia.                  Aortic Valve: 26 mm Sapien prosthetic, stented (TAVR) valve is                  present in the aortic position.  Sonographer:     Damien Senior RDCS Referring Phys:  [id number] LURENA MARLA RED Diagnosing Phys: Jerel Balding MD  Sonographer Comments: 26mm Edwards [phone] TAVR Implanted  PREOPERATIVE FINDINGS: Low normal left ventricular systolic function and normal regional wall motion. Estimated left ventricular ejection fraction 50-55%. Trileaflet aortic valve with severe calcific stenosis. Peak gradient 38 mm Hg, mean gradient 22 mm Hg, dimensionless index 0.20, calculated aortic valve area 0.71 cm2 (indexed for BSA 0.38 cm2/m2). Gradients are likely severely underestimated due to difficult CW Doppler beam alignment. There is mild-moderate aortic insufficiency. There is no mitral insufficiency. There  is no pericardial effusion. POSTOPERATIVE FINDINGS: Normal left ventricular systolic function and regional wall motion. Estimated left ventricular ejection fraction 60%. Well deployed [employer] stent-valve (TAVR). Peak gradient 4 mm Hg, mean gradient 2 mm Hg, dimensionless index 0.67, calculated  aortic valve area 2.15 cm2 (indexed for BSA 1.65 cm2/m2), acceleration time ms. There is no aortic insufficiency/perivalvular leak. There is no mitral insufficiency. There is no pericardial effusion.  IMPRESSIONS  1. Left ventricular ejection fraction, by estimation, is 50 to 55%. The left ventricle has low normal function. The left ventricle has no regional wall motion abnormalities.  2. Mild mitral stenosis. The mean mitral valve gradient is 4.0 mmHg with average heart rate of 66 bpm.  3. The aortic valve has been repaired/replaced. There is a 26 mm Sapien prosthetic (TAVR) valve present in the aortic position. Aortic valve Vmax measures 0.96 m/s. Aortic valve acceleration time measures 53 msec. FINDINGS  Left Ventricle: Left ventricular ejection fraction, by estimation, is 50 to 55%. The left ventricle has low normal function. The left ventricle has no regional wall motion abnormalities. The left ventricular internal cavity size was normal in size. Pericardium: There is no evidence of pericardial effusion. Mitral Valve: Mild mitral valve stenosis. MV peak gradient, 8.8 mmHg. The mean mitral valve gradient is 4.0 mmHg with average heart rate of 66 bpm. Aortic Valve: The aortic valve has been repaired/replaced. Aortic valve mean gradient measures 2.0 mmHg. Aortic valve peak gradient measures 3.7 mmHg. Aortic valve area, by VTI measures 2.15 cm. There is a 26 mm Sapien prosthetic, stented (TAVR) valve present in the aortic position. Additional Comments: Spectral Doppler performed. Color Doppler performed.  LEFT VENTRICLE PLAX 2D LVOT diam:     2.20 cm LV SV:         46 LV SV Index:   35 LVOT Area:     3.80 cm  LV Volumes (MOD) LV vol d, MOD A2C: 75.4 ml LV vol d, MOD A4C: 86.5 ml LV vol s, MOD A2C: 39.7 ml LV vol s, MOD A4C: 43.1 ml LV SV MOD A2C:     35.7 ml LV SV MOD A4C:     86.5 ml LV SV MOD BP:      44.3 ml AORTIC VALVE AV Area (Vmax):    2.56 cm AV Area (Vmean):   2.74 cm AV Area (VTI):     2.15 cm AV  Vmax:           96.10 cm/s AV Vmean:          61.000 cm/s AV VTI:            0.212 m AV Peak Grad:      3.7 mmHg AV Mean Grad:      2.0 mmHg LVOT Vmax:         64.60 cm/s LVOT Vmean:        44.000 cm/s LVOT VTI:          0.120 m LVOT/AV VTI ratio: 0.57 MITRAL VALVE MV Area VTI:  1.12 cm   SHUNTS MV Peak grad: 8.8 mmHg   Systemic VTI:  0.12 m MV Mean grad: 4.0 mmHg   Systemic Diam: 2.20 cm MV Vmax:      1.48 m/s MV Vmean:     88.3 cm/s Jerel Croitoru MD Electronically signed by Jerel Balding MD Signature Date/Time: 05/22/2024/5:10:00 PM    Final    Structural Heart Procedure Result Date: 05/22/2024 See surgical note for result.  DG Chest 2 View  Result Date: 05/18/2024 CLINICAL DATA:  Preop exam severe aortic stenosis. EXAM: DG CHEST 2V COMPARISON:  CT 04/20/2024 FINDINGS: The cardiomediastinal contours are normal. Emphysema with chronic hyperinflation and central bronchial thickening. Pulmonary vasculature is normal. No consolidation, pleural effusion, or pneumothorax. No acute osseous abnormalities are seen. IMPRESSION: Emphysema with chronic hyperinflation and central bronchial thickening. Electronically Signed   By: Andrea Gasman M.D.   On: 05/18/2024 11:36   VAS US  ABI WITH/WO TBI Result Date: 05/11/2024  LOWER EXTREMITY DOPPLER STUDY Patient Name:  DINESHA TWIGGS  Date of Exam:   05/11/2024 Medical Rec #: [id number]           Accession #:    [id number] Date of Birth: 11/01/1955           Patient Gender: F Patient Age:   49 years Exam Location:  [address] Procedure:      VAS US  ABI WITH/WO TBI Referring Phys: NORMAN SERVE --------------------------------------------------------------------------------  Indications: Peripheral artery disease. High Risk Factors: Hyperlipidemia, current smoker, coronary artery disease.  Comparison Study: right: 1.16                   left: 0.49 Performing Technologist: Dena Pane  Examination Guidelines: A complete evaluation includes at minimum, Doppler  waveform signals and systolic blood pressure reading at the level of bilateral brachial, anterior tibial, and posterior tibial arteries, when vessel segments are accessible. Bilateral testing is considered an integral part of a complete examination. Photoelectric Plethysmograph (PPG) waveforms and toe systolic pressure readings are included as required and additional duplex testing as needed. Limited examinations for reoccurring indications may be performed as noted.  ABI Findings: +---------+------------------+-----+---------+--------+ Right    Rt Pressure (mmHg)IndexWaveform Comment  +---------+------------------+-----+---------+--------+ Brachial 189                    triphasic         +---------+------------------+-----+---------+--------+ PTA      173               0.92 triphasic         +---------+------------------+-----+---------+--------+ DP       189               1.00 triphasic         +---------+------------------+-----+---------+--------+ Great Toe49                0.26                   +---------+------------------+-----+---------+--------+ +--------+------------------+-----+-----------+-------+ Left    Lt Pressure (mmHg)IndexWaveform   Comment +--------+------------------+-----+-----------+-------+ [id number]                    triphasic          +--------+------------------+-----+-----------+-------+ PTA     86                0.46 monophasic         +--------+------------------+-----+-----------+-------+ DP      76                0.40 multiphasic        +--------+------------------+-----+-----------+-------+ +-------+-----------+-----------+------------+------------+ ABI/TBIToday's ABIToday's TBIPrevious ABIPrevious TBI +-------+-----------+-----------+------------+------------+ Right  1.00       0.26       1.16        absent       +-------+-----------+-----------+------------+------------+ Left   0.46       absent     0.49         absent       +-------+-----------+-----------+------------+------------+  Bilateral ABIs appear essentially unchanged. Right TBIs appear increased.  Summary: Right: Resting right ankle-brachial index is within normal range. The right toe-brachial index is abnormal.  Left: Resting left ankle-brachial index indicates severe left lower extremity arterial disease.  Unable to obtain left digit PPG waveform and pressure. *See table(s) above for measurements and observations.  Electronically signed by Norman Serve on 05/11/2024 at 4:12:52 PM.    Final     EKG:12:39 am ECG with CHB, escape HR 36bpm (personally reviewed) TELEMETRY: Overnight patient initially with long first degree AVB. Around 12:20am, patient began to have intermittent AV block that progressed to complete heart block, escape rate in low 30s. Telemetry then shows transcutaneous pacing with intermittent capture. Following temp wire placement, consistent V pacing seen. (personally reviewed)  DEVICE HISTORY: n/a  Assessment/Plan:  Complete heart block s/p TAVR Patient s/p TAVR on 10/21 noted with new LBBB post-operatively. Overnight developed 2:1 AVB followed by symptomatic CHB necessitating temp pacing. Per Dr. Wendel, slow ventricular escape with CHB this morning. Threshold checked today, <2.0V. Ivan Lacher leave output at [facility] to ensure adequate margin. Plan for dual chamber PPM, timing to be determined.  Avoid AV nodal agents (on Coreg  PTA, has not received this admission)  Severe Aortic stenosis s/p TAVR Successful TAVR with 26 mm Edwards Sapien 3 Ultra Resilia THV via the TF approach on 05/22/24. On ASA and Plavix .      For questions or updates, please contact [facility] Please consult [email or web address] for contact info under     Signed, Artist Pouch, PA-C  05/23/2024, 10:01 AM        Barnie LILLETTE Rakers was seen by me today along with Artist Pouch. I have personally performed an evaluation on this patient.  My  findings are as follows: 68 y.o. female patient with a past history as above admitted to the hospital post TAVR implant.  She did well initially post TAVR, but developed progressive conduction disease into complete heart block overnight.  She is now post temporary wire placement through the right IJ.  She currently feels well and is without complaint.  Temporary wire was adjusted this morning without change in conduction.  Data: EKG(s) and pertinent labs, studies, etc were personally reviewed and interpreted by me:  EKG, telemetry Otherwise, I agree with data as outlined by the advanced practice provider.  Exam performed by me: Gen: Alert and oriented Neck: No JVD Cardiac: Bradycardic Lungs: Normal work of breathing Extremities: No edema  My Assessment and Plan: 1.  Complete heart block: Occurred post TAVR.  Patient has temporary wire in place.  She Chicquita Moreno likely require permanent pacing.  For now, we Eileene Kisling continue to monitor.   Tal Kempker hopefully plan for pacemaker implant over the next few days.  2.  Aorta stenosis: Post SAVR.  Plan per primary team  Signed,  Macrina Lehnert Gladis Norton, MD  05/23/2024 12:39 PM

## 2024-05-23 NOTE — H&P (View-Only) (Signed)
 Discussed with pt walking for exercise at d/c, restrictions, smoking cessation, and CRPII. Pt groggy at times. Sts she wants to quit smoking and has decreased to 2-4 cigarettes a day. She has tried to get nicorette  gum but there was a delay in the prescription. Encouraged her to think about cold [place] at d/c since she will have several days of not smoking. She was not very interested in that method. Gave her information for support. Will refer to [place] CRPII.  [id number] Aliene Aris BS, ACSM-CEP 05/23/2024 11:11 AM

## 2024-05-23 NOTE — Progress Notes (Signed)
  Echocardiogram 2D Echocardiogram has been performed.  Norleen ORN [facility] 05/23/2024, 8:42 AM

## 2024-05-23 NOTE — Significant Event (Addendum)
 Christie Moreno is a 68 year old female with AS who underwent a transcatheter aortic valve replacement on 05/22/2024. Overnight, patient developed 2:1 AV block with ventricular rates in the 30s. Initial plan to start dopamine, however, her conduction disease progressed to complete heart block with intermittent loss of a ventricular escape rhythm. Patient had episodes of emesis during this episode. She received transcutaneous pacing and the cath lab was activated for transvenous pacemaker. Given 0.5mg  IV ativan prior to transcutaneous pacing. She is full code. She will be transferred to the cardiac intensive care unit after TVP. EP evaluation in the morning.     Krishon Adkison, MD Cardiology Fellow

## 2024-05-23 NOTE — Progress Notes (Signed)
 Discussed with pt walking for exercise at d/c, restrictions, smoking cessation, and CRPII. Pt groggy at times. Sts she wants to quit smoking and has decreased to 2-4 cigarettes a day. She has tried to get nicorette  gum but there was a delay in the prescription. Encouraged her to think about cold [place] at d/c since she will have several days of not smoking. She was not very interested in that method. Gave her information for support. Will refer to [place] CRPII.  [id number] Aliene Aris BS, ACSM-CEP 05/23/2024 11:11 AM

## 2024-05-23 NOTE — Progress Notes (Signed)
   05/23/24 0100  Spiritual Encounters  Type of Visit Initial;Attempt (pt unavailable)  Conversation partners present during encounter Nurse  OnCall Visit Yes    Chaplain was paged to Code STEMI. RN informed that the patient's condition is stable and under control. No Chaplain presence needed at this time.     M.Kubra Susanna Kerry Resident [phone]

## 2024-05-23 NOTE — Progress Notes (Addendum)
 HEART AND VASCULAR CENTER   MULTIDISCIPLINARY HEART VALVE TEAM  Patient Name: Christie Moreno Date of Encounter: 05/23/2024  Admit date: 05/22/2024   PCP:  Gayle Saddie FALCON, PA-C  [facility] Cardiologist:  Dorn Lesches, MD  [facility] Structural heart: Lurena MARLA Red, MD [facility] Electrophysiologist:  None   Hospital Problem List     Principal Problem:   S/P TAVR (transcatheter aortic valve replacement) Active Problems:   Seizure disorder (HCC)   Tobacco dependence   Protein-calorie malnutrition, severe   Aortic stenosis   Hyperlipidemia   Hypothyroidism   Raynaud disease   COPD with acute exacerbation (HCC)   (HFpEF) heart failure with preserved ejection fraction (HCC)   Peripheral vascular disease   Subjective   No complaints. Wants internal jugular of her neck. Wonders when pacer will be.  Inpatient Medications    Scheduled Meds:  aspirin  EC  81 mg Oral Daily   atorvastatin   80 mg Oral q1800   Chlorhexidine Gluconate Cloth  6 each Topical Daily   clopidogrel   75 mg Oral Daily   estrogens (conjugated)  1.25 mg Oral Daily   feeding supplement  237 mL Oral BID BM   Influenza vac split trivalent PF  0.5 mL Intramuscular Tomorrow-1000   lamoTRIgine   100 mg Oral BID   levothyroxine   25 mcg Oral QAC breakfast   progesterone  200 mg Oral QHS   sodium chloride  flush  3 mL Intravenous Q12H   Continuous Infusions:  sodium chloride      sodium chloride      nitroGLYCERIN      PRN Meds: sodium chloride , sodium chloride , morphine  injection, ondansetron  (ZOFRAN ) IV, mouth rinse, sodium chloride  flush, sodium chloride  flush, traMADol   Vital Signs    Vitals:   05/23/24 0515 05/23/24 0530 05/23/24 0600 05/23/24 0630  BP: 139/73 119/75 134/67 122/72  Pulse: 69 69 69 69  Resp: (!) 23 19 (!) 21 (!) 22  Temp:      TempSrc:      SpO2: 92% 93% 93% 93%  Weight:      Height:        Intake/Output Summary (Last 24 hours) at 05/23/2024 0916 Last data  filed at 05/23/2024 0500 Gross per 24 hour  Intake 1226.67 ml  Output 2350 ml  Net -1123.33 ml   Filed Weights   05/22/24 0723 05/23/24 0248  Weight: 37.6 kg 41.8 kg    Physical Exam    GEN: thin and elderly  HEENT: Grossly normal.  Neck: Supple, no JVD or masses. Cardiac: RRR, murmur @ apex, but heard throughout precordium. No rubs, or gallops. No clubbing, cyanosis, edema.   Respiratory:  Respirations regular and unlabored, clear to auscultation bilaterally. GI: Soft, nontender, nondistended, BS + x 4. MS: no deformity or atrophy. Skin: warm and dry, no rash. Chronic venous stasis changes. Groin sites clear without hematoma or ecchymosis  Neuro:  Strength and sensation are intact. Psych: AAOx3.  Normal affect.  Labs    CBC Recent Labs    05/22/24 1120 05/23/24 0743  WBC  --  10.2  HGB 8.8* 11.0*  HCT 26.0* 35.3*  MCV  --  72.8*  PLT  --  162   Basic Metabolic Panel: Recent Labs  Lab 05/18/24 0950 05/22/24 1120 05/23/24 0743  NA 137 139 135  K 3.7 3.2* 3.7  CL 101 103 101  CO2 25  --  23  GLUCOSE 65* 94 111*  BUN 20 16 11   CREATININE 0.93 0.60 0.77  CALCIUM  8.7*  --  8.3*   GFR: Estimated Creatinine Clearance: 43.2 mL/min (by C-G formula based on SCr of 0.77 mg/dL). Recent Labs  Lab 05/18/24 0950 05/23/24 0743  WBC 6.2 10.2    Telemetry    Paced rhythm with nothing underneath  - Personally Reviewed  ECG    CHB HR 36 bpm- Personally Reviewed  Patient Profile     HEART AND VASCULAR CENTER  TAVR OPERATIVE NOTE     Date of Procedure:                05/22/2024   Preoperative Diagnosis:      Severe Aortic Stenosis    Postoperative Diagnosis:    Same    Procedure:        Transcatheter Aortic Valve Replacement - Transfemoral Approach             Edwards Sapien 3 Resilia THV (size 26 mm, model # 9755RLS)              Co-Surgeons:                        Dorise LOIS Fellers, MD and Lurena Red, MD Anesthesiologist:                  Keneth    Echocardiographer:              Croitoru   Pre-operative Echo Findings: Severe aortic stenosis Normal left ventricular systolic function   Post-operative Echo Findings: No paravalvular leak Normal left ventricular systolic function   Left Heart Catheterization Findings: Left ventricular end-diastolic pressure of _____________     Echo 05/23/24: pending  Patient Profile    Christie Moreno is a 68 y.o. female with a history of CAD, HLD, NICM with improvement on medical therapy, hypothyroidism, tobacco abuse, PVD, seizure disorder, mild MS and paradoxical low-flow low gradient AS who presented to [facility] on 05/22/24 for planned TAVR.    Assessment & Plan    Severe AS:  -- S/p successful TAVR with a 26 mm Edwards Sapien 3 Ultra Resilia THV via the TF approach on 05/22/24.  -- Post operative echo completed but pending formal read. -- Groin sites are stable. Right groin injected with lido/epi yesterday. -- Continued on home Asprin and Plavix .   CHB: -- Pt had underlying sinus with left posterior fascicular block. -- Post op ECG after TAVR showed bradycardia with a new ILBBB and mild 1st degree AV block.  -- She later developed 2:1 AV block with ventricular rates in the 30s that progressed to CHB with intermittent loss of a ventricular escape rhythm.  -- Internal jugular temp wire emergently placed and she remains pacer dependant. -- EP team will see, likely PPM Friday.   PAD: -- Mesenteric ischemia s/p SMA balloon angioplasty and stenting in 07/2023. -- Patient may require surgical revascularization for her left lower extremity  -- No rest pain.  -- Seen by Dr. Pearline on 05/11/24 and plans to see back in 3 months after TAVR. -- Continue home Asprin and Plavix .  -- Continue atorvastatin  80mg  daily.    CAD: -- Cardiac catheterization on 04/09/2024 showing mild to moderate nonobstructive coronary disease that was not significantly changed compared to the study in 2023 and  continued medical treatment was recommended.  -- Continue Asprin and Plavix .  -- Continue atorvastatin  80mg  daily.    Tobacco abuse: -- Counseled on cessation.    Signed, Lamarr Hummer, PA-C  05/23/2024, 9:16 AM  Pager [phone]   ATTENDING ATTESTATION:  After conducting a review of all available clinical information with the care team, interviewing the patient, and performing a physical exam, I agree with the findings and plan described in this note.   GEN: No acute distress, AO x 3 HEENT:  MMM, right IJ temporary pacemaker Cardiac: RRR, no murmurs, rubs, or gallops.  Respiratory: Clear to auscultation bilaterally. GI: Soft, nontender, non-distended  MS: No edema; No deformity. Neuro:  Nonfocal  Vasc: Mild bruising left radial access site; intact right femoral access site  Patient underwent TAVR yesterday.  Immediately after the valve was implanted the patient did develop isoelectric A-V dissociation however she regained native conduction after several minutes of monitoring.  However overnight she developed symptomatic 2-1 AV block and a urgent temporary pacemaker was placed by Dr. Ladona from the right internal jugular approach.  This morning the patient is doing well.  I tested her pacemaker and she has a very slow ventricular escape and complete heart block.  We will obtain EP consult as she will likely require permanent pacemaker.  Lurena Red, MD Pager [phone]

## 2024-05-23 NOTE — Progress Notes (Incomplete)
 HEART AND VASCULAR CENTER   MULTIDISCIPLINARY HEART VALVE TEAM  Patient Name: Christie Moreno Date of Encounter: 05/24/2024  Admit date: 05/22/2024   PCP:  Christie Saddie FALCON, PA-C  [facility] Cardiologist:  Christie Lesches, MD  [facility] Structural heart: Christie MARLA Red, MD [facility] Electrophysiologist:  None   Hospital Problem List     Principal Problem:   S/P TAVR (transcatheter aortic valve replacement) Active Problems:   Seizure disorder (HCC)   Tobacco dependence   Protein-calorie malnutrition, severe   Aortic stenosis   Hyperlipidemia   Hypothyroidism   Raynaud disease   COPD with acute exacerbation (HCC)   (HFpEF) heart failure with preserved ejection fraction (HCC)   Peripheral vascular disease   Subjective   Neck discomfort with temp wire. Waiting for PPM.   Inpatient Medications    Scheduled Meds:  aspirin  EC  81 mg Oral Daily   atorvastatin   80 mg Oral q1800   Chlorhexidine Gluconate Cloth  6 each Topical Daily   estrogens (conjugated)  1.25 mg Oral Daily   feeding supplement  237 mL Oral BID BM   Influenza vac split trivalent PF  0.5 mL Intramuscular Tomorrow-1000   lamoTRIgine   100 mg Oral BID   levothyroxine   25 mcg Oral QAC breakfast   progesterone  200 mg Oral QHS   sodium chloride  flush  3 mL Intravenous Q12H   Continuous Infusions:  sodium chloride      PRN Meds: sodium chloride , morphine  injection, ondansetron  (ZOFRAN ) IV, mouth rinse, sodium chloride  flush, sodium chloride  flush, traMADol   Vital Signs    Vitals:   05/24/24 0630 05/24/24 0700 05/24/24 0730 05/24/24 0733  BP:      Pulse: (!) 50 (!) 45 (!) 45   Resp: (!) 25 17 20    Temp:    98.7 F (37.1 C)  TempSrc:    Oral  SpO2: 99% 93% 98%   Weight:      Height:        Intake/Output Summary (Last 24 hours) at 05/24/2024 9177 Last data filed at 05/23/2024 2000 Gross per 24 hour  Intake 49.96 ml  Output 400 ml  Net -350.04 ml   Filed Weights   05/22/24  0723 05/23/24 0248 05/24/24 0400  Weight: 37.6 kg 41.8 kg 42 kg    Physical Exam    GEN: thin and elderly  HEENT: Grossly normal.  Neck: Supple, no JVD or masses. Cardiac: RRR, murmur @ apex, but heard throughout precordium. No rubs, or gallops. No clubbing, cyanosis, edema.   Respiratory:  Respirations regular and unlabored, clear to auscultation bilaterally. GI: Soft, nontender, nondistended, BS + x 4. MS: no deformity or atrophy. Skin: warm and dry, no rash. Chronic venous stasis changes. Groin sites clear without hematoma or ecchymosis  Neuro:  Strength and sensation are intact. Psych: AAOx3.  Normal affect.  Labs    CBC Recent Labs    05/23/24 0743 05/24/24 0337  WBC 10.2 8.3  HGB 11.0* 10.7*  HCT 35.3* 35.2*  MCV 72.8* 74.1*  PLT 162 142*   Basic Metabolic Panel: Recent Labs  Lab 05/18/24 0950 05/22/24 1120 05/23/24 0743 05/24/24 0337  NA 137 139 135 134*  K 3.7 3.2* 3.7 4.7  CL 101 103 101 100  CO2 25  --  23 25  GLUCOSE 65* 94 111* 97  BUN 20 16 11 9   CREATININE 0.93 0.60 0.77 0.78  CALCIUM  8.7*  --  8.3* 8.1*  MG  --   --  1.8 2.4   GFR: Estimated Creatinine Clearance: 43.4 mL/min (by C-G formula based on SCr of 0.78 mg/dL). Recent Labs  Lab 05/18/24 0950 05/23/24 0743 05/24/24 0337  WBC 6.2 10.2 8.3    Telemetry    Paced rhythm with nothing underneath  - Personally Reviewed  ECG    CHB HR 36 bpm- Personally Reviewed  Patient Profile     [facility] AND VASCULAR CENTER  TAVR OPERATIVE NOTE     Date of Procedure:                05/22/2024   Preoperative Diagnosis:      Severe Aortic Stenosis    Postoperative Diagnosis:    Same    Procedure:        Transcatheter Aortic Valve Replacement - Transfemoral Approach             Edwards Sapien 3 Resilia THV (size 26 mm, model # 9755RLS)              Co-Surgeons:                        Christie LOIS Fellers, MD and Christie Red, MD Anesthesiologist:                  Christie Moreno   Echocardiographer:               Christie Moreno   Pre-operative Echo Findings: Severe aortic stenosis Normal left ventricular systolic function   Post-operative Echo Findings: No paravalvular leak Normal left ventricular systolic function   Left Heart Catheterization Findings: Left ventricular end-diastolic pressure of _____________     Echo 05/23/24:  IMPRESSIONS   1. Left ventricular ejection fraction, by estimation, is 60 to 65%. The  left ventricle has normal function. The left ventricle has no regional  wall motion abnormalities. There is mild concentric left ventricular  hypertrophy. Left ventricular diastolic  function could not be evaluated.   2. Right ventricular systolic function is normal. The right ventricular  size is normal. There is normal pulmonary artery systolic pressure.   3. The mitral valve is degenerative. Trivial mitral valve regurgitation.  Mild mitral stenosis. The mean mitral valve gradient is 4.0 mmHg. Severe  mitral annular calcification.   4. The aortic valve has been repaired/replaced. Aortic valve  regurgitation is not visualized. Procedure Date: 05/22/24. Echo findings  are consistent with normal structure and function of the aortic valve  prosthesis. Aortic valve area, by VTI measures  1.41 cm. Aortic valve mean gradient measures 4.8 mmHg. Aortic valve Vmax  measures 1.64 m/s.   5. The inferior vena cava is normal in size with <50% respiratory  variability, suggesting right atrial pressure of 8 mmHg.   Patient Profile    Christie Moreno is a 68 y.o. female with a history of CAD, HLD, NICM with improvement on medical therapy, hypothyroidism, tobacco abuse, PVD, seizure disorder, mild MS and paradoxical low-flow low gradient AS who presented to [facility] on 05/22/24 for planned TAVR.    Assessment & Plan    Severe AS:  -- S/p successful TAVR with a 26 mm Edwards Sapien 3 Ultra Resilia THV via the TF approach on 05/22/24.  -- Post operative echo showed EF 60%, mild  LVH, severe MAC with mid MS, and normally functioning TAVR with a mean gradient of 4.8 mmHg and no PVL. -- Groin sites are stable.  -- Continued on home Asprin and Plavix .  -- Waiting for PPM  placement.   CHB: -- Pt had underlying sinus with left posterior fascicular block. -- Post op ECG after TAVR showed bradycardia with a new ILBBB and mild 1st degree AV block.  -- She later developed 2:1 AV block with ventricular rates in the 30s that progressed to CHB with intermittent loss of a ventricular escape rhythm.  -- Internal jugular temp wire emergently placed 05/23/24 and she remains pacer dependant. -- Appreciate EP team. PPM today or Friday. NPO  PAD: -- Mesenteric ischemia s/p SMA balloon angioplasty and stenting in 07/2023. -- Patient may require surgical revascularization for her left lower extremity  -- No rest pain.  -- Seen by Dr. Pearline on 05/11/24 and plans to see back in 3 months after TAVR. -- Continue home Asprin and Plavix .  -- Continue atorvastatin  80mg  daily.    CAD: -- Cardiac catheterization on 04/09/2024 showing mild to moderate nonobstructive coronary disease that was not significantly changed compared to the study in 2023 and continued medical treatment was recommended.  -- Continue Asprin and Plavix .  -- Continue atorvastatin  80mg  daily.    Tobacco abuse: -- Counseled on cessation.    SignedLamarr Hummer, PA-C  05/24/2024, 8:22 AM  Pager [phone]

## 2024-05-24 ENCOUNTER — Encounter (HOSPITAL_COMMUNITY)
Admission: RE | Disposition: A | Discharge status: Home / Self Care | Payer: Self-pay | Source: Home / Self Care | Attending: Internal Medicine

## 2024-05-24 ENCOUNTER — Encounter (HOSPITAL_COMMUNITY): Payer: Self-pay | Admitting: Internal Medicine

## 2024-05-24 DIAGNOSIS — I442 Atrioventricular block, complete: Secondary | ICD-10-CM | POA: Diagnosis not present

## 2024-05-24 DIAGNOSIS — Z952 Presence of prosthetic heart valve: Secondary | ICD-10-CM | POA: Diagnosis not present

## 2024-05-24 HISTORY — PX: PACEMAKER IMPLANT: EP1218

## 2024-05-24 LAB — BASIC METABOLIC PANEL WITH GFR
Anion gap: 9 (ref 5–15)
BUN: 9 mg/dL (ref 8–23)
CO2: 25 mmol/L (ref 22–32)
Calcium: 8.1 mg/dL — ABNORMAL LOW (ref 8.9–10.3)
Chloride: 100 mmol/L (ref 98–111)
Creatinine, Ser: 0.78 mg/dL (ref 0.44–1.00)
GFR, Estimated: >60 mL/min (ref >60)
Glucose, Bld: 97 mg/dL (ref 70–99)
Potassium: 4.7 mmol/L (ref 3.5–5.1)
Sodium: 134 mmol/L — ABNORMAL LOW (ref 135–145)

## 2024-05-24 LAB — CBC
HCT: 35.2 % — ABNORMAL LOW (ref 36.0–46.0)
Hemoglobin: 10.7 g/dL — ABNORMAL LOW (ref 12.0–15.0)
MCH: 22.5 pg — ABNORMAL LOW (ref 26.0–34.0)
MCHC: 30.4 g/dL (ref 30.0–36.0)
MCV: 74.1 fL — ABNORMAL LOW (ref 80.0–100.0)
Platelets: 142 K/uL — ABNORMAL LOW (ref 150–400)
RBC: 4.75 MIL/uL (ref 3.87–5.11)
RDW: 22.9 % — ABNORMAL HIGH (ref 11.5–15.5)
WBC: 8.3 K/uL (ref 4.0–10.5)
nRBC: 0 % (ref 0.0–0.2)

## 2024-05-24 LAB — MAGNESIUM: Magnesium: 2.4 mg/dL (ref 1.7–2.4)

## 2024-05-24 LAB — SURGICAL PCR SCREEN
MRSA, PCR: NEGATIVE
Staphylococcus aureus: NEGATIVE

## 2024-05-24 SURGERY — PACEMAKER IMPLANT

## 2024-05-24 MED ORDER — LIDOCAINE HCL (PF) 1 % IJ SOLN
INTRAMUSCULAR | Status: DC | PRN
  Administered 2024-05-24: 60 mL

## 2024-05-24 MED ORDER — MIDAZOLAM HCL 5 MG/5ML IJ SOLN
INTRAMUSCULAR | Status: DC | PRN
  Administered 2024-05-24: .5 mg via INTRAVENOUS
  Administered 2024-05-24: 1 mg via INTRAVENOUS

## 2024-05-24 MED ORDER — CEFAZOLIN SODIUM-DEXTROSE 2-4 GM/100ML-% IV SOLN
2.0000 g | INTRAVENOUS | Status: AC
  Filled 2024-05-24 (×2): qty 100

## 2024-05-24 MED ORDER — SODIUM CHLORIDE 0.9 % IV SOLN
INTRAVENOUS | Status: AC
  Filled 2024-05-24: qty 2

## 2024-05-24 MED ORDER — HEPARIN (PORCINE) IN NACL 1000-0.9 UT/500ML-% IV SOLN
INTRAVENOUS | Status: DC | PRN
  Administered 2024-05-24: 500 mL

## 2024-05-24 MED ORDER — MIDAZOLAM HCL 5 MG/5ML IJ SOLN
INTRAMUSCULAR | Status: AC
  Filled 2024-05-24: qty 5

## 2024-05-24 MED ORDER — SODIUM CHLORIDE 0.9% FLUSH: 3.0000 mL | INTRAVENOUS | Status: DC | PRN

## 2024-05-24 MED ORDER — SODIUM CHLORIDE 0.9 % IV SOLN: INTRAVENOUS | Status: DC

## 2024-05-24 MED ORDER — LIDOCAINE HCL 1 % IJ SOLN
INTRAMUSCULAR | Status: AC
  Filled 2024-05-24: qty 60

## 2024-05-24 MED ORDER — FENTANYL CITRATE (PF) 100 MCG/2ML IJ SOLN
INTRAMUSCULAR | Status: DC | PRN
  Administered 2024-05-24 (×2): 25 ug via INTRAVENOUS

## 2024-05-24 MED ORDER — CEFAZOLIN SODIUM-DEXTROSE 2-4 GM/100ML-% IV SOLN
INTRAVENOUS | Status: AC
  Administered 2024-05-24: 2 g via INTRAVENOUS
  Filled 2024-05-24: qty 100

## 2024-05-24 MED ORDER — CHLORHEXIDINE GLUCONATE 4 % EX SOLN
60.0000 mL | Freq: Once | CUTANEOUS | Status: AC
  Administered 2024-05-24: 4 via TOPICAL

## 2024-05-24 MED ORDER — ACETAMINOPHEN 325 MG PO TABS
975.0000 mg | ORAL_TABLET | Freq: Three times a day (TID) | ORAL | Status: DC
Start: 2024-05-24 — End: 2024-05-25
  Administered 2024-05-24 – 2024-05-25 (×3): 975 mg via ORAL
  Filled 2024-05-24 (×3): qty 3

## 2024-05-24 MED ORDER — CHLORHEXIDINE GLUCONATE 4 % EX SOLN
60.0000 mL | Freq: Once | CUTANEOUS | Status: AC
  Administered 2024-05-24: 4 via TOPICAL
  Filled 2024-05-24: qty 60

## 2024-05-24 MED ORDER — SODIUM CHLORIDE 0.9 % IV SOLN
80.0000 mg | INTRAVENOUS | Status: AC
  Administered 2024-05-24: 80 mg
  Filled 2024-05-24: qty 2

## 2024-05-24 MED ORDER — SODIUM CHLORIDE 0.9% FLUSH
3.0000 mL | Freq: Two times a day (BID) | INTRAVENOUS | Status: DC
  Administered 2024-05-24 (×2): 3 mL via INTRAVENOUS

## 2024-05-24 MED ORDER — FENTANYL CITRATE (PF) 100 MCG/2ML IJ SOLN
INTRAMUSCULAR | Status: AC
  Filled 2024-05-24: qty 2

## 2024-05-24 SURGICAL SUPPLY — 13 items
CABLE SURGICAL S-101-97-12 (CABLE) ×1 IMPLANT
CATH SELECT PACE 669182 (CATHETERS) IMPLANT
CATHETER SSPC NXT 2.5 DELIVRY (CATHETERS) IMPLANT
CUTTER LV DELIVERY CATHETER 7 (MISCELLANEOUS) IMPLANT
LEAD INGEVITY 7841 52 (Lead) IMPLANT
LEAD INGEVITY 7842 59 (Lead) IMPLANT
PACEMAKER ACCOLADE DR-EL (Pacemaker) IMPLANT
PAD DEFIB RADIO PHYSIO CONN (PAD) ×1 IMPLANT
SHEATH 7FR PRELUDE SNAP 13 (SHEATH) IMPLANT
SHEATH 8FR PRELUDE SNAP 13 (SHEATH) IMPLANT
SHEATH 9FR PRELUDE SNAP 13 (SHEATH) IMPLANT
TRAY PACEMAKER INSERTION (PACKS) ×1 IMPLANT
WIRE HI TORQ VERSACORE-J 145CM (WIRE) IMPLANT

## 2024-05-24 NOTE — Op Note (Signed)
 Procedure:  LEFT sided BSCI Dual Chamber/LbaP PPM Implant  Pre-op Diagnosis: CHB    Post-op Diagnosis: same  Procedure Date: 05/24/24  Attending: Adina Primus, MD   Anesthesia: monitored anesthesia care  Procedure Report:  The LEFT shoulder was prepped with chlorhexidine scrub. 30 cc lidocaine  was injected at the planned incision site. A pocket was created with electrocautery and blunt dissection.    No venogram was performed. The LEFT axillary vein was accessed using a standard access needle under ultrasound guidance. A wire was passed into the IVC. The access process was repeated once and a second wire was advanced into the IVC.  A 9 Fr sheath was advanced and the dilator removed. Through this short sheath, a SSPC 2.5 was advanced to the basal RV septum.   The RV lead was inserted into the sheath and the lead was then advanced to the basal RV septum. This brought the lead too far superior on the septum so the SSPC 2.5 was exchanged for a SSPC 2 guide catheter.   The site for deployment was identified based on RAO and LAO fluoroscopy and response to pacing. With RV endocardial pacing, we observed a W pattern in V1. The helix was extended and the Ingevity lead was advanced into the RV septum with several clockwise turns..   The paced morphology demonstrated qR in V1.  An isoelectric segment was present (24 ms).  The paced QRS duration was 136 ms.  The above findings were most consistent with selective LB capture  The sheath was split and removed and the RV lead was firmly attached to the pre-pectoralis fascia with two 0-silk sutures.   A 7 Fr sheath was placed over the second wire. The RA lead was subsequently advanced over a straight stylet into the inferior RA. A curved stylet was then used to affix the RA lead in the right atrial appendage. The active fixation screw was deployed, and interrogation revealed a current of injury with acceptable lead parameters (see below). The  peel away sheath was removed and the lead was affixed to the fascia using two 0-silk sutures.   The device was then connected to the leads. The pocket was irrigated with Gentamicin solution. The pocket was closed with continuous 2-0 and 3-0 V-loc. Dermabond was used to close the superficial layer. Steri-strips were applied and a vaso-occlusive dressing was placed. Steri-strips were applied over the incision.   The device was evaluated by the representative of the cardiac device manufacturer under the supervision of the attending physician with implant parameters listed below.   Complications: none Estimated blood loss: less than 50 mL  Implanted Hardware: Implant Name Type Inv. Item Serial No. Manufacturer Lot No. LRB No. Used Action  PACEMAKER ACCOLADE DR-EL - [id number] Pacemaker PACEMAKER ACCOLADE DR-EL [id number] [employer]  N/A 1 Implanted  LEAD INGEVITY 7842 59 - [id number] Lead LEAD INGEVITY 7842 59  [employer] [id number] N/A 1 Implanted  LEAD INGEVITY 7841 52 - [id number] Lead LEAD INGEVITY 7841 52  [employer] [id number] N/A 1 Implanted   Device Information: PPM: BSCI Accolade MRI EL L331, SN [id number], DOI 05/24/24 RA:  BSCI 7841 Ingevity+, SN [id number], DOI 05/24/24 RV/LBaP: BSCI 7842 Ingevity+, SN [id number], DOI 05/24/24  Lead Interrogation Data: RA: 2.4 mV / 619 ohms / 0.7 V @ 0.5 ms RV/LBaP: 8.1 mV (TVP paced, no escape) / 731 ohms / 1.0 V @ 0.5 ms  Summary: 1. Successful implant of a LEFT sided BSCI  DC/LBBAP PPM with selective LB capture  Recommendations: Routine post-procedure care with bedrest for 3 hours No heparin  (IV or subcutaneous) for 48 hours. No enoxaparin  (IV or subcutaneous) for 7 days.  PA/lateral CXR in AM Wound check in AM Device interrogation in AM NPO at midnight  Donnice DELENA Primus, MD [facility]  Cardiac Electrophysiology

## 2024-05-24 NOTE — Progress Notes (Addendum)
 Patient Name: ELLINA SIVERTSEN Date of Encounter: 05/24/2024  Primary Cardiologist: Dorn Lesches, MD Electrophysiologist: None  Interval Summary   The patient is doing okay today. Continues to express pinching discomfort of neck with internal jugular temp wire.  At this time, the patient denies chest pain, shortness of breath, or any new concerns.  Vital Signs    Vitals:   05/24/24 0100 05/24/24 0200 05/24/24 0300 05/24/24 0400  BP: (!) 147/54 (!) 127/54 (!) 116/90 (!) 156/62  Pulse: (!) 50 (!) 50 (!) 50 (!) 50  Resp: (!) 25 19 (!) 21 19  Temp:    98.3 F (36.8 C)  TempSrc:    Oral  SpO2: 93% 93% 98% 98%  Weight:    42 kg  Height:        Intake/Output Summary (Last 24 hours) at 05/24/2024 0659 Last data filed at 05/23/2024 2000 Gross per 24 hour  Intake 69.35 ml  Output 400 ml  Net -330.65 ml   Filed Weights   05/22/24 0723 05/23/24 0248 05/24/24 0400  Weight: 37.6 kg 41.8 kg 42 kg    Physical Exam    GEN- NAD, Alert and oriented  Lungs- Normal work of breathing Cardiac- Regular rate and rhythm, no murmurs, rubs or gallops GI- soft, NT, ND, + BS Extremities- no clubbing or cyanosis. No edema  Telemetry    V paced rhythm, 50bpm (personally reviewed)  Hospital Course    KAENA SANTORI is a 68 y.o. female with a history of severe AS s/p TAVR, HFpEF, peripheral vascular disease, seizure disorder, tobacco use, hyperlipidemia, hypothyroidism, Raynaud disease, COPD who is being seen today for the evaluation of complete heart block s/p TAVR at the request of Dr. Wendel. Patient with successful TAVR on 10/21, received 26mm Edwards Sapien 3 Ultra Resilia THV. Immediately after implant, patient with AV dissociation but regained native conduction after a few minutes. Postoperatively, patient continued to recover well, had bradycardia with new LBBB, 1st degree AVB on ECG. Overnight cardiologist was notified of patient developing 2:1 AVB with HR in the 30s. This  progressed to CHB with acute symptoms and patient was transcutaneously paced prior to STAT placement of transvenous pacemaker.   Assessment & Plan    Complete heart block s/p TAVR Patient s/p TAVR on 10/21 noted with new LBBB post-operatively. Overnight developed 2:1 AVB followed by symptomatic CHB necessitating temp pacing. Pacing turned down to 40bpm this morning, patient remains in CHB with escape rate ~44-45bpm. [place] leave temp pacing backup at 40bpm. Plan for dual chamber PPM, timing to be determined. Emerson Barretto keep NPO this morning in case we are able to facilitate today. Avoid AV nodal agents (on Coreg  PTA, has not received this admission) Explained risks, benefits, and alternatives to PPM implantation, including but not limited to bleeding, infection, damage to heart or lungs, heart attack, stroke, or death.  Pt verbalized understanding and agrees to proceed. Timing of Plavix  resumption post implant to be determined by pocket stability.     Severe Aortic stenosis s/p TAVR Successful TAVR with 26 mm Edwards Sapien 3 Ultra Resilia THV via the TF approach on 05/22/24. On ASA and Plavix .     For questions or updates, please contact [facility] Please consult [email or web address] for contact info under     Signed, Artist Pouch, PA-C  05/24/2024, 6:59 AM     Barnie LILLETTE Rakers was seen by me today along with Artist Pouch. I have personally performed an evaluation on this patient.  My findings  are as follows: 68 y.o. female feeling well.  No acute complaints.  Remains in complete heart block.  Data: EKG(s) and pertinent labs, studies, etc were personally reviewed and interpreted by me:  Telemetry Otherwise, I agree with data as outlined by the advanced practice provider.  Exam performed by me: Gen: No acute distress Neck: No JVD Cardiac: Bradycardic Lungs: Normal work of breathing Extremities: No edema  My Assessment and Plan: 1.  Complete heart block: Occurred post TAVR  implant.  She remains in complete heart block, though she is not pacemaker dependent.  Azaylah Stailey plan for permanent pacemaker implant today.  Risks and benefits were discussed.    2.  A stenosis: Post TAVR.  Plan per primary team  Signed,  Akira Perusse Gladis Norton, MD  05/24/2024 4:55 PM

## 2024-05-24 NOTE — Plan of Care (Signed)

## 2024-05-24 NOTE — Interval H&P Note (Signed)
 History and Physical Interval Note:  05/24/2024 1:39 PM  Christie Moreno  has presented today for surgery, with the diagnosis of heart block.  The various methods of treatment have been discussed with the patient and family. After consideration of risks, benefits and other options for treatment, the patient has consented to  Procedure(s): PACEMAKER IMPLANT (N/A) as a surgical intervention.  The patient's history has been reviewed, patient examined, no change in status, stable for surgery.  I have reviewed the patient's chart and labs.  Questions were answered to the patient's satisfaction.    Reviewed risks, benefits and alternatives. Remains with CHB with no escape < 35. Plan for LEFT DC/LBaP PPM implant today. Labs reviewed, allergies reviewed.    Christie Moreno

## 2024-05-25 ENCOUNTER — Encounter (HOSPITAL_COMMUNITY): Payer: Self-pay | Admitting: Student in an Organized Health Care Education/Training Program

## 2024-05-25 ENCOUNTER — Inpatient Hospital Stay (HOSPITAL_COMMUNITY)

## 2024-05-25 LAB — BASIC METABOLIC PANEL WITH GFR
Anion gap: 6 (ref 5–15)
BUN: 10 mg/dL (ref 8–23)
CO2: 26 mmol/L (ref 22–32)
Calcium: 7.7 mg/dL — ABNORMAL LOW (ref 8.9–10.3)
Chloride: 103 mmol/L (ref 98–111)
Creatinine, Ser: 0.79 mg/dL (ref 0.44–1.00)
GFR, Estimated: >60 mL/min (ref >60)
Glucose, Bld: 95 mg/dL (ref 70–99)
Potassium: 3.8 mmol/L (ref 3.5–5.1)
Sodium: 135 mmol/L (ref 135–145)

## 2024-05-25 LAB — CBC
HCT: 33.2 % — ABNORMAL LOW (ref 36.0–46.0)
Hemoglobin: 10.2 g/dL — ABNORMAL LOW (ref 12.0–15.0)
MCH: 22.7 pg — ABNORMAL LOW (ref 26.0–34.0)
MCHC: 30.7 g/dL (ref 30.0–36.0)
MCV: 73.8 fL — ABNORMAL LOW (ref 80.0–100.0)
Platelets: 115 K/uL — ABNORMAL LOW (ref 150–400)
RBC: 4.5 MIL/uL (ref 3.87–5.11)
RDW: 22.7 % — ABNORMAL HIGH (ref 11.5–15.5)
WBC: 7.4 K/uL (ref 4.0–10.5)
nRBC: 0 % (ref 0.0–0.2)

## 2024-05-25 MED ORDER — CARVEDILOL 6.25 MG PO TABS: ORAL_TABLET | ORAL | 6 refills | Status: DC

## 2024-05-25 MED ORDER — PROMETHAZINE (PHENERGAN) 6.25MG IN NS 50ML IVPB: 6.2500 mg | INTRAVENOUS | Status: DC

## 2024-05-25 MED ORDER — ONDANSETRON HCL 4 MG/2ML IJ SOLN
4.0000 mg | Freq: Once | INTRAMUSCULAR | Status: AC
  Filled 2024-05-25: qty 2

## 2024-05-25 MED ORDER — MAGNESIUM HYDROXIDE 400 MG/5ML PO SUSP
5.0000 mL | Freq: Once | ORAL | Status: AC
  Administered 2024-05-25: 5 mL via ORAL
  Filled 2024-05-25: qty 30

## 2024-05-25 MED ORDER — PROMETHAZINE (PHENERGAN) 6.25MG IN NS 50ML IVPB
6.2500 mg | Freq: Four times a day (QID) | INTRAVENOUS | Status: DC | PRN
  Administered 2024-05-25: 6.25 mg via INTRAVENOUS
  Filled 2024-05-25: qty 6.25

## 2024-05-25 MED ORDER — CARVEDILOL 6.25 MG PO TABS: 6.2500 mg | ORAL_TABLET | Freq: Two times a day (BID) | ORAL | Status: DC

## 2024-05-25 MED ORDER — FUROSEMIDE 20 MG PO TABS
20.0000 mg | ORAL_TABLET | Freq: Every day | ORAL | Status: DC
  Administered 2024-05-25: 20 mg via ORAL
  Filled 2024-05-25: qty 1

## 2024-05-25 NOTE — Progress Notes (Addendum)
 DC cancelled due to pt being nauseated/constipated and reporting that she cannot walk due to weakness. Will treat with promethazine  (zofran  didn't help) and milk of magnesia. Will get a PT consult and transfer to 4E.  Lamarr Hummer PA-C  [facility]      ADDENDUM: 1:50pm. Pt is feeling much better and walked with nursing staff and did quite well. Pt wants to go home. Keep same discharge plan

## 2024-05-25 NOTE — Progress Notes (Addendum)
  Patient Name: Christie Moreno Date of Encounter: 05/25/2024  Primary Cardiologist: Christie Lesches, MD Electrophysiologist: Christie DELENA Primus, MD  Interval Summary   The patient is doing well today.  At this time, the patient denies chest pain, shortness of breath, or any new concerns.  Vital Signs    Vitals:   05/25/24 0000 05/25/24 0100 05/25/24 0200 05/25/24 0334  BP: 130/63 (!) 135/113 (!) 168/81   Pulse: 71 84 70   Resp: 17 (!) 24 16   Temp: 98.9 F (37.2 C)   97.7 F (36.5 C)  TempSrc: Oral   Oral  SpO2: 91% 94% 94%   Weight:      Height:        Intake/Output Summary (Last 24 hours) at 05/25/2024 0657 Last data filed at 05/25/2024 0200 Gross per 24 hour  Intake 199.47 ml  Output 975 ml  Net -775.53 ml   Filed Weights   05/22/24 0723 05/23/24 0248 05/24/24 0400  Weight: 37.6 kg 41.8 kg 42 kg    Physical Exam    GEN- NAD, Alert and oriented  Lungs- Clear to ausculation bilaterally, normal work of breathing Cardiac- Regular rate and rhythm, no murmurs, rubs or gallops Chest- PPM pocket stable, scattered bruising GI- soft, NT, ND, + BS Extremities- no clubbing or cyanosis. No edema  Telemetry    A sensed, V paced 70s-80s (personally reviewed)  Hospital Course    Christie Moreno is a 68 y.o. female with a history of severe AS s/p TAVR, HFpEF, peripheral vascular disease, seizure disorder, tobacco use, hyperlipidemia, hypothyroidism, Raynaud disease, COPD who is being seen today for the evaluation of complete heart block s/p TAVR at the request of Dr. Wendel. Patient with successful TAVR on 10/21, received 26mm Edwards Sapien 3 Ultra Resilia THV. Immediately after implant, patient with AV dissociation but regained native conduction after a few minutes. Postoperatively, patient continued to recover well, had bradycardia with new LBBB, 1st degree AVB on ECG. Overnight cardiologist was notified of patient developing 2:1 AVB with HR in the 30s. This  progressed to CHB with acute symptoms and patient was transcutaneously paced prior to STAT placement of transvenous pacemaker.   Assessment & Plan    Complete heart block s/p TAVR S/p Dual chamber PPM (BSCI) Patient s/p TAVR on 10/21 noted with new LBBB post-operatively. Overnight developed 2:1 AVB followed by symptomatic CHB necessitating temp pacing. Patient with successful implant of dual chamber BSCI PPM with Dr. Primus on 10/23. 2 view CXR completed. Reviewed with MD, leads stable, no pneumothorax, formal read pending (hold discharge until read is back). Device interrogation today with appropriate impedance, sensing, thresholds.  Teaching completed, follow ups in place. Per Dr. Primus, okay to resume Plavix  05/26/24.  Severe Aortic stenosis s/p TAVR Successful TAVR with 26 mm Edwards Sapien 3 Ultra Resilia THV via the TF approach on 05/22/24. On ASA. As above, Plavix  to resume 05/26/24.     For questions or updates, please contact Christie Moreno Please consult [email or web address] for contact info under     Signed, Christie Pouch, PA-C  05/25/2024, 6:57 AM

## 2024-05-25 NOTE — TOC CM/SW Note (Addendum)
 Transition of Care [facility]) - Inpatient Brief Assessment   Patient Details  Name: Christie Moreno MRN: [id number] Date of Birth: 30-Jun-1956  [facility]) CM/SW Contact:    Sudie Erminio Deems, RN Phone Number: 05/25/2024, 10:35 AM   Clinical Narrative: Patient S/p TAVR. PTA patient was independent from home with fiance' Roy. Patient has insurance and PCP. No home needs identified at this time. Patient states [facility] will transport home.   10-24 Discharge was cancelled- per MD notes patient unable to walk-PT/OT consult ordered.   1410 Patient ambulated with nursing staff. Plans to return home today.  Transition of Care Asessment: Insurance and Status: Insurance coverage has been reviewed Patient has primary care physician: Yes Home environment has been reviewed: lives with fiance' Prior level of function:: independent Prior/Current Home Services: No current home services Social Drivers of Health Review: SDOH reviewed no interventions necessary Readmission risk has been reviewed: Yes Transition of care needs: no transition of care needs at this time

## 2024-05-28 ENCOUNTER — Ambulatory Visit: Attending: Physician Assistant | Admitting: Physician Assistant

## 2024-05-28 ENCOUNTER — Encounter: Payer: Self-pay | Admitting: Emergency Medicine

## 2024-05-28 ENCOUNTER — Telehealth: Payer: Self-pay

## 2024-05-28 ENCOUNTER — Telehealth (HOSPITAL_COMMUNITY): Payer: Self-pay

## 2024-05-28 VITALS — HR 76 | Ht 61.0 in | Wt 90.1 lb

## 2024-05-28 DIAGNOSIS — I2510 Atherosclerotic heart disease of native coronary artery without angina pectoris: Secondary | ICD-10-CM | POA: Diagnosis present

## 2024-05-28 DIAGNOSIS — Z720 Tobacco use: Secondary | ICD-10-CM | POA: Diagnosis present

## 2024-05-28 DIAGNOSIS — Z950 Presence of cardiac pacemaker: Secondary | ICD-10-CM | POA: Diagnosis present

## 2024-05-28 DIAGNOSIS — I739 Peripheral vascular disease, unspecified: Secondary | ICD-10-CM | POA: Diagnosis present

## 2024-05-28 DIAGNOSIS — R350 Frequency of micturition: Secondary | ICD-10-CM | POA: Diagnosis present

## 2024-05-28 DIAGNOSIS — M79604 Pain in right leg: Secondary | ICD-10-CM | POA: Insufficient documentation

## 2024-05-28 DIAGNOSIS — Z952 Presence of prosthetic heart valve: Secondary | ICD-10-CM | POA: Insufficient documentation

## 2024-05-28 DIAGNOSIS — I10 Essential (primary) hypertension: Secondary | ICD-10-CM | POA: Insufficient documentation

## 2024-05-28 MED ORDER — CARVEDILOL 6.25 MG PO TABS: 6.2500 mg | ORAL_TABLET | Freq: Two times a day (BID) | ORAL | 3 refills | Status: AC

## 2024-05-28 NOTE — Progress Notes (Signed)
 Wait until cleared by pacemaker follow up

## 2024-05-28 NOTE — Patient Instructions (Addendum)
 Medication Instructions:  Your physician has recommended you make the following change in your medication:  INCREASE Lasix  from 1 tablet daily to 2 tablets each daily for 5 days. Then decrease back to 1 tablet daily.  INCREASE Potassium from 1 tablet daily to 2 tablets each daily for 5 days. Then decrease back to 1 tablet daily. *If you need a refill on your cardiac medications before your next appointment, please call your pharmacy*  Lab Work: CALL OB/GYN office to schedule a urinary test. If you have any lab test that is abnormal or we need to change your treatment, we will call you to review the results.  Testing/Procedures:  This week: A venous doppler exam has been ordered for your right leg. A team member will reach out to you to schedule this appointment.    07/12/24: Echocardiogram Your physician has requested that you have an echocardiogram. Echocardiography is a painless test that uses sound waves to create images of your heart. It provides your doctor with information about the size and shape of your heart and how well your heart's chambers and valves are working. This procedure takes approximately one hour. There are no restrictions for this procedure. Please do NOT wear cologne, perfume, aftershave, or lotions (deodorant is allowed). Please arrive 15 minutes prior to your appointment time.  Please note: We ask at that you not bring children with you during ultrasound (echo/ vascular) testing. Due to room size and safety concerns, children are not allowed in the ultrasound rooms during exams. Our front office staff cannot provide observation of children in our lobby area while testing is being conducted. An adult accompanying a patient to their appointment will only be allowed in the ultrasound room at the discretion of the ultrasound technician under special circumstances. We apologize for any inconvenience.   Follow-Up: At [facility], you and your health needs are our  priority.  As part of our continuing mission to provide you with exceptional heart care, our providers are all part of one team.  This team includes your primary Cardiologist (physician) and Advanced Practice Providers or APPs (Physician Assistants and Nurse Practitioners) who all work together to provide you with the care you need, when you need it.  Your next appointment:   As scheduled on 07/12/24  Provider:   Izetta Hummer, PA-C  We recommend signing up for the patient portal called MyChart.  Sign up information is provided on this After Visit Summary.  MyChart is used to connect with patients for Virtual Visits (Telemedicine).  Patients are able to view lab/test results, encounter notes, upcoming appointments, etc.  Non-urgent messages can be sent to your provider as well.   To learn more about what you can do with MyChart, go to [email or web address].

## 2024-05-28 NOTE — Transitions of Care (Post Inpatient/ED Visit) (Signed)
   05/28/2024  Name: Christie Moreno MRN: [id number] DOB: 09-28-1955  Today's TOC FU Call Status: Today's TOC FU Call Status:: Successful TOC FU Call Completed TOC FU Call Complete Date: 05/28/24 Patient's Name and Date of Birth confirmed.  Transition Care Management Follow-up Telephone Call How have you been since you were released from the hospital?: Same Any questions or concerns?: No  Items Reviewed: Did you receive and understand the discharge instructions provided?: Yes Medications obtained,verified, and reconciled?: Yes (Medications Reviewed) (verbally states that she has all her medications but did not want to review.) Any new allergies since your discharge?: No  Medications Reviewed Today: declined to review Medications Reviewed Today   Medications were not reviewed in this encounter     Home Care and Equipment/Supplies: declined to review     Functional Questionnaire: declined to review    Follow up appointments reviewed: PCP Follow-up appointment confirmed?: No (states will call PCP office) MD Provider Line [phone] Given: No Specialist Hospital Follow-up appointment confirmed?: Yes Date of Specialist follow-up appointment?: 05/28/24 Follow-Up Specialty Provider:: Cardiology Do you need transportation to your follow-up appointment?: No Do you understand care options if your condition(s) worsen?: Yes-patient verbalized understanding   Call X2 with patient today.  At first call patient was going to cardiology office. 2nd call patient arranging transportation. Declined follow up call tomorrow.  Wants to be called on 05/30/2024. Will schedule appointment. Assessments not completed today per patient decline.  Alan Ee, RN, BSN, CEN [employer]- Transition of Care Team.  [facility] [phone]

## 2024-05-28 NOTE — Telephone Encounter (Signed)
 Called patient to see if she is interested in the Cardiac Rehab Program. Patient expressed interest. Explained scheduling process and went over insurance, patient verbalized understanding. Will contact patient for scheduling once f/u has been completed.

## 2024-05-28 NOTE — Telephone Encounter (Signed)
 Pt insurance is active and benefits verified through Medicare a/b Co-pay 0, DED $257/$257 met, out of pocket 0/0 met, co-insurance 20%. no pre-authorization required. Passport, 05/28/2024@2 :39, REF# [id number]   TCR/ICR? ICR Visit(date of service)limitation? 72 Can multiple codes be used on the same date of service/visit?(IF ITS A LIMIT) yes    Is this a lifetime maximum or an annual maximum? annual Has the member used any of these services to date? no Is there a time limit (weeks/months) on start of program and/or program completion? no   Will contact patient to see if she is interested in the Cardiac Rehab Program. If interested, patient will need to complete follow up appt. Once completed, patient will be contacted for scheduling upon review by the RN Navigator.

## 2024-05-28 NOTE — Transitions of Care (Post Inpatient/ED Visit) (Signed)
   05/28/2024  Name: Christie Moreno MRN: [id number] DOB: 10/21/1955  Today's TOC FU Call Status: Today's TOC FU Call Status:: Unsuccessful Call (1st Attempt) Unsuccessful Call (1st Attempt) Date: 05/28/24  Attempted to reach the patient regarding the most recent Inpatient/ED visit. Placed call to patient who reports she is not doing well. Reports that she has a doctors appointment at 0930 and asked for a call back later.   Follow Up Plan: Additional outreach attempts will be made to reach the patient to complete the Transitions of Care (Post Inpatient/ED visit) call.   Alan Ee, RN, BSN, CEN [employer]- Transition of Care Team.  [facility] [phone]

## 2024-05-28 NOTE — Progress Notes (Signed)
 [facility]   MULTIDISCIPLINARY HEART VALVE CLINIC                                     Cardiology Office Note:    Date:  05/28/2024   ID:  Christie Moreno, DOB May 19, 1956, MRN [id number]  PCP:  Gayle Saddie FALCON, PA-C  [facility] Cardiologist:  Dorn Lesches, MD  [facility] Structural heart: Lurena MARLA Red, MD [facility] Electrophysiologist:  Donnice DELENA Primus, MD   Referring MD: Gayle Saddie FALCON, PA-C   [facility] s/p TAVR  History of Present Illness:    Christie Moreno is a 68 y.o. 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  She has a history of mesenteric ischemia s/p SMA balloon angioplasty and stenting in 07/2023 and left lower extremity ischemia due to occlusion of left common femoral and external iliac arteries. Echocardiogram in April 2023 showed an ejection fraction of 40 to 45% with a calcified aortic valve with a low mean gradient of 3 mmHg and a valve area by VTI of 2.0 cm. Cardiac catheterization in April 2023 showed moderate stenoses in multiple coronary branches and GDMT was recommended. Her most recent echocardiogram on 03/12/2024 showed EF 55% and severe AS with mean grad 18.0 mmHg, Vmax 2.70 m/s, AVA 0.83 cm2, DVI 0.28, SVI 37, Mild MS, Mild-mod AI. She underwent repeat cardiac catheterization on 04/09/2024 showing mild to moderate nonobstructive coronary disease that was not significantly changed compared to the study in 2023 and continue medical treatment was recommended. S/p TAVR with a 26 mm Edwards Sapien 3 Ultra Resilia THV via the TF approach on 05/22/24. Post operative echo showed EF 60%, mild LVH, severe MAC with mid MS, and normally functioning TAVR with a mean gradient of 4.8 mmHg and no PVL. She later developed CHB and underwent LEFT sided  BSCI DC/LBBAP PPM with selective LB capture on 05/24/24 by Dr. Primus. BP was elevated during admission and Coreg  increased from 3.125mg  BID to 6.25mg  BID.   Today the patient presents to clinic for follow up. Here alone. She underwent TAVR and pacemaker placement last week and experiences persistent pain on the left side, particularly around the left breast where the pacemaker was placed. Chest pain is localized to the area around the pacemaker but does not occur during physical activity. There is no dizziness or syncope. She has swelling in both legs, which is new since the surgery. Previously, swelling was primarily in the left leg. The right leg feels tight with some pain, while the left leg has been swollen before when not elevating her feet. No SOB, orthopnea or PND. No dizziness or syncope.    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 Red and Dr. Lucas   Seizures (  HCC)    none in years, recently had medication change   Thyroid disease      Current Medications: Current Meds  Medication Sig   albuterol  (PROVENTIL ) (2.5 MG/3ML) 0.083% nebulizer solution Take 3 mLs (2.5 mg total) by nebulization every 6 (six) hours as needed for wheezing or shortness of breath.   albuterol  (VENTOLIN  HFA) 108 (90 Base) MCG/ACT inhaler Inhale 2 puffs into the lungs every 4 (four) hours as needed for wheezing or shortness of breath.   aspirin  81 MG EC tablet Take 1 tablet (81 mg total) by mouth daily. Swallow whole.   atorvastatin  (LIPITOR ) 80 MG tablet Take 1 tablet (80 mg total) by  mouth daily at 6 PM.   budeson-glycopyrrolate-formoterol (BREZTRI  AEROSPHERE) 160-9-4.8 MCG/ACT AERO inhaler Inhale 2 puffs into the lungs 2 (two) times daily.   clopidogrel  (PLAVIX ) 75 MG tablet Take 1 tablet (75 mg total) by mouth daily.   estradiol (ESTRACE) 0.01 % CREA vaginal cream Place 1 Applicatorful vaginally at bedtime.   estrogens, conjugated, (PREMARIN) 1.25 MG tablet Take 1.25 mg by mouth daily.   feeding supplement (ENSURE ENLIVE / ENSURE PLUS) LIQD Take 237 mLs by mouth 2 (two) times daily between meals.   furosemide  (LASIX ) 40 MG tablet Take 0.5 tablets (20 mg total) by mouth daily. TAKE ONE-HALF (1/2) TABLET (20 MG TOTAL) DAILY   lamoTRIgine  (LAMICTAL ) 100 MG tablet Take 1 tablet (100 mg total) by mouth 2 (two) times daily.   levothyroxine  (SYNTHROID ) 25 MCG tablet Take 1 tablet (25 mcg total) by mouth daily before breakfast.   potassium chloride  SA (KLOR-CON  M) 20 MEQ tablet TAKE 1 TABLET DAILY (KEEP UPCOMING APPOINTMENT FOR FUTURE REFILLS)   progesterone (PROMETRIUM) 200 MG capsule Take 200 mg by mouth at bedtime.   Wheat Dextrin (BENEFIBER PO) Take 1 Dose by mouth daily.   [DISCONTINUED] carvedilol  (COREG ) 6.25 MG tablet TAKE 1 TABLET(3.125 MG) BY MOUTH TWICE DAILY WITH A MEAL      ROS:   Please see the history of present illness.    All other systems reviewed and are negative.  EKGs       Risk Assessment/Calculations:           Physical Exam:    VS:  Pulse 76   Ht 5' 1 (1.549 m)   Wt 90 lb 1.6 oz (40.9 kg)   BMI 17.02 kg/m     Wt Readings from Last 3 Encounters:  05/28/24 90 lb 1.6 oz (40.9 kg)  05/24/24 92 lb 9.5 oz (42 kg)  05/11/24 84 lb 3.2 oz (38.2 kg)     GEN: frail, thin woman. chronically ill appearing. NECK: No JVD. Ecchymosis noted on chest and over pacer site. CARDIAC: RRR, no murmurs, rubs, gallops RESPIRATORY:  Clear to auscultation without rales, wheezing or rhonchi  ABDOMEN: Soft, non-tender, non-distended EXTREMITIES:  1+  bilateral edema; No deformity.  Groin sites clear without hematoma or ecchymosis.   ASSESSMENT:    1. S/P TAVR (transcatheter aortic valve replacement)   2. Pacemaker   3. Right leg pain   4. PAD (peripheral artery disease)   5. Coronary artery disease involving native coronary artery of native heart without angina pectoris   6. Tobacco abuse   7. Essential hypertension   8. Urinary frequency     PLAN:    In order of problems listed above:  Severe AS s/p TAVR:  -- Pt doing okay s/p TAVR.  -- Groin sites healing well.  -- SBE prophylaxis discussed; the  patient is edentulous and does not go to the dentist.   -- Continue on chronic DAPT with Asprin 81mg  daily and Plavix  75mg  daily. -- Continue restrictions for pacemaker site.  -- I will see back for 1 month echo and OV.  CHB s/p PPM: -- S/p LEFT sided BSCI DC/LBBAP PPM with selective LB capture on 05/24/24 by Dr. Almetta.  -- Keep follow up for wound check.  -- Having pain in chest wall. Possibly mild pocket hematoma, but I think it's more just the protrusion of the device through thin chest.   New right LE edema with calf tenderness: -- Will get a LE duplex to rule out DVT.    PAD: -- Mesenteric ischemia s/p SMA balloon angioplasty and stenting in 07/2023. -- Patient may require surgical revascularization for her left lower extremity  -- No rest pain.  -- Seen by Dr. Pearline on 05/11/24 and plans to see back in 3 months after TAVR. -- Continue home Asprin and Plavix .  -- Continue atorvastatin  80mg  daily.    CAD: -- Cardiac catheterization on 04/09/2024 showing mild to moderate nonobstructive coronary disease that was not significantly changed compared to the study in 2023 and continued medical treatment was recommended.  -- Continue Asprin and Plavix .  -- Continue atorvastatin  80mg  daily.    Tobacco abuse: -- Counseled on cessation.    HTN: -- BP well controlled today. -- Continue Coreg   6.25mg  BID. -- Continue Lasix   20mg  daily and KCL 20 meq daily.   HFimpEF: -- EF 60% on last echo. -- Continue Coreg   6.25mg  BID. -- Has new worsening bilateral LE edema. Increase lasix /Kcl to 40mg  / 40meq daily x 5 days and then go back to normal dosing.   Urinary frequency: -- Having increased urinary frequency and dysuria following recent yeast infection treated with fluconazole. -- Will contact gynecologist, Dr. Sarrah, to arrange for urinary sample and culture.    Cardiac Rehabilitation Eligibility Assessment  The patient is NOT ready to start cardiac rehabilitation due to: Other        Medication Adjustments/Labs and Tests Ordered: Current medicines are reviewed at length with the patient today.  Concerns regarding medicines are outlined above.  Orders Placed This Encounter  Procedures   ECHOCARDIOGRAM COMPLETE   VAS US  LOWER EXTREMITY VENOUS (DVT)   Meds ordered this encounter  Medications   carvedilol  (COREG ) 6.25 MG tablet    Sig: Take 1 tablet (6.25 mg total) by mouth 2 (two) times daily with a meal. TAKE 1 TABLET(3.125 MG) BY MOUTH TWICE DAILY WITH A MEAL    Dispense:  180 tablet    Refill:  3    Patient Instructions  Medication Instructions:  Your physician has recommended you make the following change in your medication:  INCREASE Lasix  from 1 tablet daily to 2 tablets each daily for 5 days. Then decrease back to 1 tablet daily.  INCREASE Potassium from 1 tablet daily to 2 tablets each daily for 5 days. Then decrease back to 1 tablet daily. *If you need a refill on your cardiac medications before your next appointment, please call your pharmacy*  Lab Work: CALL OB/GYN office to schedule a urinary test. If you have any lab test that is abnormal or we need to change your treatment, we will call you to review the results.  Testing/Procedures:  This week: A venous doppler exam has been ordered for your right leg. A team member will reach out to you to schedule this appointment.  07/12/24: Echocardiogram Your physician has requested that you have an echocardiogram. Echocardiography is a painless test that uses sound waves to create images of your heart. It provides your doctor with information about the size and shape of your heart and how well your heart's chambers and valves are working. This procedure takes approximately one hour. There are no restrictions for this procedure. Please do NOT wear cologne, perfume, aftershave, or lotions (deodorant is allowed). Please arrive 15 minutes prior to your appointment time.  Please note: We ask at that you not bring children with you during ultrasound (echo/ vascular) testing. Due to room size and safety concerns, children are not allowed in the ultrasound rooms during exams. Our front office staff cannot provide observation of children in our lobby area while testing is being conducted. An adult accompanying a patient to their appointment will only be allowed in the ultrasound room at the discretion of the ultrasound technician under special circumstances. We apologize for any inconvenience.   Follow-Up: At [facility], you and your health needs are our priority.  As part of our continuing mission to provide you with exceptional heart care, our providers are all part of one team.  This team includes your primary Cardiologist (physician) and Advanced Practice Providers or APPs (Physician Assistants and Nurse Practitioners) who all work together to provide you with the care you need, when you need it.  Your next appointment:   As scheduled on 07/12/24  Provider:   Izetta Hummer, PA-C  We recommend signing up for the patient portal called MyChart.  Sign up information is provided on this After Visit Summary.  MyChart is used to connect with patients for Virtual Visits (Telemedicine).  Patients are able to view lab/test results, encounter notes, upcoming appointments, etc.  Non-urgent messages can be sent to your  provider as well.   To learn more about what you can do with MyChart, go to [email or web address].          Signed, Lamarr Hummer, PA-C  05/28/2024 2:51 PM    [facility]

## 2024-05-29 ENCOUNTER — Ambulatory Visit (HOSPITAL_COMMUNITY)
Admission: RE | Admit: 2024-05-29 | Discharge: 2024-05-29 | Disposition: A | Discharge status: Home / Self Care | Source: Ambulatory Visit | Attending: Physician Assistant | Admitting: Physician Assistant

## 2024-05-29 ENCOUNTER — Ambulatory Visit: Payer: Self-pay | Admitting: Physician Assistant

## 2024-05-29 DIAGNOSIS — M79604 Pain in right leg: Secondary | ICD-10-CM | POA: Insufficient documentation

## 2024-05-30 ENCOUNTER — Other Ambulatory Visit: Payer: Self-pay

## 2024-05-30 NOTE — Transitions of Care (Post Inpatient/ED Visit) (Signed)
 05/30/2024  Name: Christie Moreno MRN: [id number] DOB: [birth year]  Today's TOC FU Call Status: see previous note from original TOC on 05/28/2024   Patient's Name and Date of Birth confirmed.  Transition Care Management Follow-up Telephone Call    Items Reviewed: Dietary orders reviewed?: Yes Type of Diet Ordered:: low salt heart healthy diet Do you have support at home?: Yes People in Home [RPT]: significant other  Medications Reviewed Today: Medications Reviewed Today     Reviewed by Rumalda Alan PENNER, RN (Registered Nurse) on 05/30/24 at 1113  Med List Status: <None>   Medication Order Taking? Sig Documenting Provider Last Dose Status Informant  albuterol  (PROVENTIL ) (2.5 MG/3ML) 0.083% nebulizer solution [id number] Yes Take 3 mLs (2.5 mg total) by nebulization every 6 (six) hours as needed for wheezing or shortness of breath. Chandra Toribio POUR, MD  Active Self  albuterol  (VENTOLIN  HFA) 108 [phone] Base) MCG/ACT inhaler [id number] Yes Inhale 2 puffs into the lungs every 4 (four) hours as needed for wheezing or shortness of breath. Chandra Toribio POUR, MD  Active Self  aspirin  81 MG EC tablet [id number] Yes Take 1 tablet (81 mg total) by mouth daily. Swallow whole. Arrien, Mauricio Daniel, MD  Active Self  atorvastatin  (LIPITOR ) 80 MG tablet [id number] Yes Take 1 tablet (80 mg total) by mouth daily at 6 PM. Court Dorn PARAS, MD  Active Self  budeson-glycopyrrolate-formoterol (BREZTRI  AEROSPHERE) 160-9-4.8 MCG/ACT AERO inhaler [id number] Yes Inhale 2 puffs into the lungs 2 (two) times daily. Chandra Toribio POUR, MD  Active Self  carvedilol  (COREG ) 6.25 MG tablet [id number] Yes Take 1 tablet (6.25 mg total) by mouth 2 (two) times daily with a meal. TAKE 1 TABLET(3.125 MG) BY MOUTH TWICE DAILY WITH A MEAL Sebastian Lamarr SAUNDERS, PA-C  Active   clopidogrel  (PLAVIX ) 75 MG tablet [id number] Yes Take 1 tablet (75 mg total) by mouth daily. Chandra Toribio POUR, MD  Active Self  estradiol (ESTRACE) 0.01 % CREA vaginal  cream [id number] Yes Place 1 Applicatorful vaginally at bedtime. [provider]  Active Self           Med Note (   Tue May 15, 2024  3:07 PM) Has not started yet  estrogens, conjugated, (PREMARIN) 1.25 MG tablet [id number] Yes Take 1.25 mg by mouth daily. [provider]  Active Self           Med Note REGINO, [name]   Mon Nov 02, 2021  5:03 AM)    feeding supplement (ENSURE ENLIVE / ENSURE PLUS) LIQD [id number] Yes Take 237 mLs by mouth 2 (two) times daily between meals. Arrien, Elidia Toribio, MD  Active Self           Med Note JACKOLYN WADDELL DEL   Thu Apr 05, 2024  2:11 PM)    furosemide  (LASIX ) 40 MG tablet [id number] Yes Take 0.5 tablets (20 mg total) by mouth daily. TAKE ONE-HALF (1/2) TABLET (20 MG TOTAL) DAILY  Patient taking differently: Take 0.5 tablets (20 mg total) by mouth daily. TAKE ONE-HALF (1/2) TABLET (20 MG TOTAL) DAILY   Court Dorn PARAS, MD  Active Self  lamoTRIgine  (LAMICTAL ) 100 MG tablet [id number] Yes Take 1 tablet (100 mg total) by mouth 2 (two) times daily. Gayland Lauraine PARAS, NP  Active Self  levothyroxine  (SYNTHROID ) 25 MCG tablet [id number] Yes Take 1 tablet (25 mcg total) by mouth daily before breakfast. Wallace Joesph LABOR, PA  Active Self  potassium chloride  SA (KLOR-CON  M) 20 MEQ tablet [id number] Yes  TAKE 1 TABLET DAILY (KEEP UPCOMING APPOINTMENT FOR FUTURE REFILLS)  Patient taking differently: TAKE 1 TABLET DAILY (KEEP UPCOMING APPOINTMENT FOR FUTURE REFILLS)   Court Dorn PARAS, MD  Active Self  progesterone (PROMETRIUM) 200 MG capsule [id number] Yes Take 200 mg by mouth at bedtime. [provider]  Active Self  Wheat Dextrin (BENEFIBER PO) [id number] Yes Take 1 Dose by mouth daily. [provider]  Active Self           Today's Vitals   05/30/24 1116 05/30/24 1118  Weight: 85 lb 6.4 oz (38.7 kg) 89 lb 6.4 oz (40.6 kg)  PainSc:  4     Home Care and Equipment/Supplies: Were Home Health Services Ordered?: No Any new equipment  or medical supplies ordered?: No  Functional Questionnaire: Do you need assistance with bathing/showering or dressing?: No Do you need assistance with meal preparation?: No Do you need assistance with eating?: No Do you have difficulty maintaining continence: Yes (urinary) Do you need assistance with getting out of bed/getting out of a chair/moving?: No Do you have difficulty managing or taking your medications?: No  Follow up appointments reviewed: PCP Follow-up appointment confirmed?: No (will call today to schedule) MD Provider Line [phone] Given: No Specialist Hospital Follow-up appointment confirmed?: Yes Date of Specialist follow-up appointment?: 05/28/24 Do you need transportation to your follow-up appointment?: No Do you understand care options if your condition(s) worsen?: Yes-patient verbalized understanding  SDOH Interventions Today    Flowsheet Row Most Recent Value  SDOH Interventions   Food Insecurity Interventions Intervention Not Indicated  Housing Interventions Intervention Not Indicated  Transportation Interventions Intervention Not Indicated  Utilities Interventions Intervention Not Indicated    This is follow up call to finish TOC from 05/28/2024.  Patient reports that she is doing well. Reports that she is taking her medications as prescribed. Reports that she had a doppler yesterday and Ua for symptoms post discharge.  Patient reports that she weighs daily and follows her low salt diet.   Interventions: Reviewed wound care. Reviewed s/s of infection and when to call MD Reviewed when to call 911. Discussed activity restrictions as per AVS. Reviewed with patient the importance of follow up with PCP and she will call to schedule based on her transportation. Reviewed importance of taking all medications as prescribed. Assessed bowel function.  Reviewed and offered 30 day TOC program and patient declined. Reports doing well.   Alan Ee, RN, BSN,  CEN [employer]- Transition of Care Team.  [facility] [phone]

## 2024-05-31 ENCOUNTER — Telehealth: Payer: Self-pay | Admitting: *Deleted

## 2024-05-31 NOTE — Telephone Encounter (Signed)
 Contacted pt and scheduled a hospital follow up

## 2024-05-31 NOTE — Telephone Encounter (Signed)
 Copied from CRM #[id number]. Topic: Appointments - Scheduling Inquiry for Clinic >> May 31, 2024 11:57 AM Winona SAUNDERS wrote: Pt discharged Oct 24th from [facility] and need hospital follow up. No provider avail in office

## 2024-06-06 ENCOUNTER — Ambulatory Visit: Attending: Cardiology

## 2024-06-06 DIAGNOSIS — I442 Atrioventricular block, complete: Secondary | ICD-10-CM | POA: Insufficient documentation

## 2024-06-06 LAB — CUP PACEART INCLINIC DEVICE CHECK
Date Time Interrogation Session: 20251105130218
Implantable Lead Connection Status: 753985
Implantable Lead Connection Status: 753985
Implantable Lead Implant Date: 20251023
Implantable Lead Implant Date: 20251023
Implantable Lead Location: 753859
Implantable Lead Location: 753860
Implantable Lead Model: 7841
Implantable Lead Model: 7842
Implantable Lead Serial Number: 1522134
Implantable Lead Serial Number: 1674678
Implantable Pulse Generator Implant Date: 20251023
Lead Channel Impedance Value: 587 Ohm
Lead Channel Impedance Value: 667 Ohm
Lead Channel Pacing Threshold Amplitude: 0.6 V
Lead Channel Pacing Threshold Amplitude: 1.1 V
Lead Channel Pacing Threshold Pulse Width: 0.4 ms
Lead Channel Pacing Threshold Pulse Width: 0.4 ms
Lead Channel Sensing Intrinsic Amplitude: 5.1 mV
Lead Channel Setting Pacing Amplitude: 3.5 V
Lead Channel Setting Pacing Amplitude: 3.5 V
Lead Channel Setting Pacing Pulse Width: 0.4 ms
Lead Channel Setting Sensing Sensitivity: 3 mV
Pulse Gen Serial Number: 203839
Zone Setting Status: 755011

## 2024-06-06 NOTE — Patient Instructions (Signed)
  After Your Pacemaker   Monitor your pacemaker site for redness, swelling, and drainage. Call the device clinic at [phone] if you experience these symptoms or fever/chills.  Your incision was closed with Steri-strips or staples:  You may shower 7 days after your procedure and wash your incision with soap and water . Avoid lotions, ointments, or perfumes over your incision until it is well-healed.  You may use a hot tub or a pool after your wound check appointment if the incision is completely closed.  Do not lift, push or pull greater than 10 pounds with the affected arm until DECEMBER 4th. There are no other restrictions in arm movement after your wound check appointment.  You may drive, unless driving has been restricted by your healthcare providers.  Remote monitoring is used to monitor your pacemaker from home. This monitoring is scheduled every 91 days by our office. It allows us  to keep an eye on the functioning of your device to ensure it is working properly. You will routinely see your Electrophysiologist annually (more often if necessary).

## 2024-06-06 NOTE — Progress Notes (Signed)
 Normal dual chamber pacemaker wound check. Presenting rhythm: AP/VP 60. Wound continues to heal w/ bruising aorund device site. There is noted unapproximated wound edges to midline of incision w/ 1 stitch protruding from distal end of incision line. Dr. Almetta evaluated, applied dermabond layer to open wound area and removed stitch from distal end of incision.  Routine testing performed. Thresholds, sensing, and impedance consistent with implant measurements and at 3.5V safety margin/auto capture until 3 month visit. No episodes. Reviewed arm restrictions to continue for 6 weeks total post op.  Pt enrolled in remote follow-up.

## 2024-06-07 ENCOUNTER — Ambulatory Visit

## 2024-06-07 VITALS — BP 151/85 | HR 71 | Temp 97.9°F | Ht 61.0 in | Wt 83.1 lb

## 2024-06-07 DIAGNOSIS — E039 Hypothyroidism, unspecified: Secondary | ICD-10-CM

## 2024-06-07 DIAGNOSIS — E782 Mixed hyperlipidemia: Secondary | ICD-10-CM

## 2024-06-07 DIAGNOSIS — Z23 Encounter for immunization: Secondary | ICD-10-CM

## 2024-06-07 DIAGNOSIS — J441 Chronic obstructive pulmonary disease with (acute) exacerbation: Secondary | ICD-10-CM

## 2024-06-07 DIAGNOSIS — G40209 Localization-related (focal) (partial) symptomatic epilepsy and epileptic syndromes with complex partial seizures, not intractable, without status epilepticus: Secondary | ICD-10-CM

## 2024-06-07 DIAGNOSIS — Z95811 Presence of heart assist device: Secondary | ICD-10-CM

## 2024-06-07 DIAGNOSIS — Z952 Presence of prosthetic heart valve: Secondary | ICD-10-CM

## 2024-06-07 DIAGNOSIS — E876 Hypokalemia: Secondary | ICD-10-CM

## 2024-06-07 DIAGNOSIS — Z131 Encounter for screening for diabetes mellitus: Secondary | ICD-10-CM

## 2024-06-07 NOTE — Assessment & Plan Note (Signed)
 Surgical sites are healing well. Blood pressure and heart rate are stable. No changes to current medications as blood pressure is well-controlled. - Continue current medications including aspirin , Plavix , and increased dose of carvedilol  carvedilol  6.25 mg twice a day, atorvastatin  80 mg. Will recheck labs to reassess anemia, electrolyte abnormalities, lipid panel, A1c and thyroid function.  Continue regular follow-ups with cardiology as scheduled.

## 2024-06-07 NOTE — Assessment & Plan Note (Signed)
 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  Surgical site is clean, dry, intact.  Is being closely monitored by cardiology.

## 2024-06-07 NOTE — Patient Instructions (Signed)
 VISIT SUMMARY: Today, we reviewed your recovery after your recent TAVR procedure and pacemaker placement. We discussed your current medications, addressed your bruising concerns, and administered your flu shot. We also planned for future lab work and vaccinations.  YOUR PLAN: POST-TAVR AND PACEMAKER RECOVERY: You are recovering from your recent TAVR procedure and pacemaker placement. Your surgical sites are healing well, and your blood pressure and heart rate are stable. -Continue taking your current medications: aspirin , Plavix , and carvedilol .  BRUISING: You are experiencing bruising, particularly in the groin area, which may be related to your antiplatelet therapy. -Monitor the bruising and report any significant changes or concerns.  HEART FAILURE: Your heart failure is being managed with carvedilol , and your blood pressure is well-controlled. -Continue taking carvedilol  as prescribed.  ANEMIA: You had anemia during your recent hospitalization, likely due to blood loss from surgery. -We have ordered lab work to assess your current anemia status.  HYPOTHYROIDISM: Your hypothyroidism is managed with levothyroxine , but your thyroid function has not been checked recently. -We have ordered thyroid function tests to check your thyroid levels.  SEIZURE DISORDER: Your seizure disorder is managed with Lamictal , and you have not reported any recent seizures. -Continue taking Lamictal  as prescribed.  IMMUNIZATIONS: You have not received your flu shot for the current season and are eligible for the pneumonia vaccine. -You received your flu shot today. -We will discuss the pneumonia vaccine at your next appointment.  If you have any problems before your next visit feel free to message me via MyChart (minor issues or questions) or call the office, otherwise you may reach out to schedule an office visit.  Thank you! Saddie Sacks, PA-C

## 2024-06-07 NOTE — Assessment & Plan Note (Signed)
 On Lamictal  100 mg twice daily.  Follows with neurology.

## 2024-06-07 NOTE — Progress Notes (Signed)
   Established Patient Office Visit  Subjective   Patient ID: Christie Moreno, female    DOB: 1956/04/27  Age: 68 y.o. MRN: [id number]  No chief complaint on file.   HPI  Christie Moreno is a 68 y.o. female who presents to the clinic today for hospital follow up.  Her past medical history is significant for coronary artery disease, hypothyroidism, hyperlipidemia, NICM with improvement on medication, tobacco use with COPD, PVD, seizure disorder.  She presented to [facility] on 05/22/2024 for planned TAVR to correct severe aortic stenosis.  The procedure was complicated by complete heart block.  During hospital stay, he had a pacemaker placed and repeat echo was reassuring per cardiology team.  She was discharged on 05/26/2024 with close follow-up with cardiology outpatient and only changes to her medications were increased dose of carvedilol  and DAPT on aspirin  and Plavix .  Today she reports    ROS Per HPI.    Objective:     There were no vitals taken for this visit.   Physical Exam   No results found for any visits on 06/07/24.    The ASCVD Risk score (Arnett DK, et al., 2019) failed to calculate for the following reasons:   Risk score cannot be calculated because patient has a medical history suggesting prior/existing ASCVD    Assessment & Plan:   There are no diagnoses linked to this encounter.  Assessment and Plan Assessment & Plan      No follow-ups on file.    Christie JULIANNA Sacks, PA-C

## 2024-06-07 NOTE — Assessment & Plan Note (Signed)
 Stable on Breztri .  Continuing to work toward smoking cessation efforts.

## 2024-06-07 NOTE — Progress Notes (Signed)
 Established Patient Office Visit  Subjective   Patient ID: Christie Moreno, female    DOB: 09/30/1955  Age: 67 y.o. MRN: [id number]  Chief Complaint  Patient presents with   Hospitalization Follow-up    HPI  Discussed the use of AI scribe software for clinical note transcription with the patient, who gave verbal consent to proceed.  History of Present Illness   Christie Moreno is a 68 year old female who presents for follow-up and medication review after a recent TAVR procedure and pacemaker placement.  Post-transcatheter aortic valve replacement (tavr) and pacemaker placement recovery Christie Moreno is a 68 y.o. female who presents to the clinic today for hospital follow up.  Her past medical history is significant for coronary artery disease, hypothyroidism, hyperlipidemia, NICM with improvement on medication, tobacco use with COPD, PVD, seizure disorder.  She presented to [facility] on 05/22/2024 for planned TAVR to correct severe aortic stenosis.  The procedure was complicated by complete heart block.  During hospital stay, he had a pacemaker placed and repeat echo was reassuring per cardiology team.  She was discharged on 05/26/2024 with close follow-up with cardiology outpatient and only changes to her medications were increased dose of carvedilol  and DAPT on aspirin  and Plavix . - Follow-up appointments with cardiology completed since discharge - Energy levels slowly improving post-procedure  Antiplatelet therapy and bruising - Currently taking aspirin  and Plavix  post-procedure - Experiencing bruising, particularly in the groin area at the procedure site - Bruising possibly related to antiplatelet therapy  Medication adjustments and current regimen - Jardiance  discontinued post-procedure - Carvedilol  dose increased from 3.125 mg to 6.25 mg - Current medications include: aspirin , Plavix , carvedilol , progesterone, potassium, levothyroxine , Lamictal , Lasix , Ensure, Benefiber,  estrogen cream (not yet started), estrogen tablets, Breztri  inhaler, atorvastatin , albuterol       ROS Per HPI.    Objective:     BP (!) 151/85   Pulse 71   Temp 97.9 F (36.6 C) (Oral)   Ht 5' 1 (1.549 m)   Wt 83 lb 1.9 oz (37.7 kg)   SpO2 100%   BMI 15.71 kg/m    Physical Exam Constitutional:      General: She is not in acute distress.    Appearance: Normal appearance.  Cardiovascular:     Rate and Rhythm: Normal rate and regular rhythm.     Heart sounds: Normal heart sounds. No murmur heard.    No friction rub. No gallop.  Pulmonary:     Effort: Pulmonary effort is normal. No respiratory distress.     Breath sounds: Normal breath sounds.  Musculoskeletal:        General: No swelling.  Skin:    General: Skin is warm and dry.     Comments: Surgical sites are c/d/i  Neurological:     General: No focal deficit present.     Mental Status: She is alert.  Psychiatric:        Mood and Affect: Mood normal.        Behavior: Behavior normal.        Thought Content: Thought content normal.      No results found for any visits on 06/07/24.    The ASCVD Risk score (Arnett DK, et al., 2019) failed to calculate for the following reasons:   Risk score cannot be calculated because patient has a medical history suggesting prior/existing ASCVD    Assessment & Plan:   Encounter for vaccination -     Flu vaccine HIGH DOSE  PF(Fluzone Trivalent)  Hypothyroidism, unspecified type Assessment & Plan: Continue Synthroid  25 mcg daily.  Rechecking TSH with labs.  Orders: -     TSH; Future -     CBC with Differential/Platelet; Future  Mixed hyperlipidemia -     Lipid panel; Future -     Comprehensive metabolic panel with GFR; Future -     CBC with Differential/Platelet; Future  Screening for diabetes mellitus -     Hemoglobin A1c; Future  COPD with acute exacerbation (HCC) Assessment & Plan: Stable on Breztri .  Continuing to work toward smoking cessation  efforts.   Partial symptomatic epilepsy with complex partial seizures, not intractable, without status epilepticus (HCC) Assessment & Plan: On Lamictal  100 mg twice daily.  Follows with neurology.   S/P TAVR (transcatheter aortic valve replacement) Assessment & Plan: Surgical sites are healing well. Blood pressure and heart rate are stable. No changes to current medications as blood pressure is well-controlled. - Continue current medications including aspirin , Plavix , and increased dose of carvedilol  carvedilol  6.25 mg twice a day, atorvastatin  80 mg. Will recheck labs to reassess anemia, electrolyte abnormalities, lipid panel, A1c and thyroid function.  Continue regular follow-ups with cardiology as scheduled.   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  Surgical site is clean, dry, intact.  Is being closely monitored by cardiology.   Hypokalemia Assessment & Plan: Continue daily Klor-Con  supplement.  Rechecking potassium levels with labs.      Return if symptoms worsen or fail to improve.    Saddie JULIANNA Sacks, PA-C

## 2024-06-07 NOTE — Assessment & Plan Note (Signed)
 Continue daily Klor-Con  supplement.  Rechecking potassium levels with labs.

## 2024-06-07 NOTE — Assessment & Plan Note (Signed)
 Continue Synthroid  25 mcg daily.  Rechecking TSH with labs.

## 2024-06-10 ENCOUNTER — Ambulatory Visit: Payer: Self-pay | Admitting: Student in an Organized Health Care Education/Training Program

## 2024-06-14 ENCOUNTER — Telehealth: Payer: Self-pay | Admitting: Neurology

## 2024-06-14 MED ORDER — LAMOTRIGINE 100 MG PO TABS: 100.0000 mg | ORAL_TABLET | Freq: Two times a day (BID) | ORAL | 3 refills | Status: AC

## 2024-06-14 NOTE — Telephone Encounter (Signed)
 Patient request refill for lamoTRIgine  (LAMICTAL ) 100 MG tablet send to [facility] #[id number]

## 2024-06-14 NOTE — Telephone Encounter (Signed)
 Requested Prescriptions   Pending Prescriptions Disp Refills   lamoTRIgine  (LAMICTAL ) 100 MG tablet 180 tablet 3    Sig: Take 1 tablet (100 mg total) by mouth 2 (two) times daily.   Refill sent

## 2024-06-21 ENCOUNTER — Telehealth: Payer: Self-pay | Admitting: Student in an Organized Health Care Education/Training Program

## 2024-06-21 ENCOUNTER — Ambulatory Visit: Payer: Self-pay

## 2024-06-21 NOTE — Telephone Encounter (Signed)
 Patient stated the bandage site where she had the anesthesia is not healing properly and she want a call back to discuss next steps.

## 2024-06-21 NOTE — Telephone Encounter (Signed)
 Attempted to return patient's call, no answer, no VM for # listed on DPR. Call to spouse, who states he will have patient call our office.

## 2024-06-21 NOTE — Telephone Encounter (Signed)
 Spoke with pt who reports she recently had a TAVR and then PPM implant.  Area where she had her IV placed on her left arm near her elbow has turned black with a small knot.  She states it looks like a worm and is weeping.  Pt advised due to current symptoms she should be further evaluated by her PCP or an urgent care.  Pt verbalizes understanding and agrees with current plan.

## 2024-06-21 NOTE — Telephone Encounter (Signed)
 FYI Only or Action Required?: FYI only for provider: appointment scheduled on 06/22/24.  Patient was last seen in primary care on 06/07/2024 by Gayle Saddie FALCON, PA-C.  Called Nurse Triage reporting Wound Infection.  Symptoms began about a month ago.  Interventions attempted: Other: Keeping wound clean and dry.  Symptoms are: gradually worsening.  Triage Disposition: See PCP When Office is Open (Within 3 Days)  Patient/caregiver understands and will follow disposition?: Yes  Copied from CRM #[id number]. Topic: Clinical - Red Word Triage >> Jun 21, 2024  1:28 PM Christie Moreno wrote: [facility] that prompted transfer to Nurse Triage: Patient has a knot/tail on arm. It's sore and oozing out stuff on her left arm Reason for Disposition  [1] After 14 days AND [2] wound isn't healed  Answer Assessment - Initial Assessment Questions Pt reports getting TAVR with PM placement on 10/24 and having IV inserted into her left forearm at that time. Reports having a 1/4 inch open pink sore where IV was inserted for the past month that has not healed. Reports mild soreness in the area, mild redness, and like there is a knot upstream from the wound. Denies fever, numbness or tingling. Scheduled appt with different provider at home office tomorrow d/t no PCP availability within timeframe. Advised UC or ED for worsening symptoms.   1. LOCATION: Where is the wound located?      Left forearm  2. WOUND APPEARANCE: What does the wound look like?      Pink with some tissue hanging out. Some redness around the wound. Feels like there is a knot upstream from the wound that is a little tender.  3. SIZE: If redness is present, ask: What is the size of the red area? (Inches, centimeters, or compare to size of a coin)      Less than a 1/4 inch  4. SPREAD: What's changed in the last day?  Do you see any red streaks coming from the wound?     Denies  5. ONSET: When did it start to look infected?      About 1  month ago onset of sore  6. MECHANISM: How did the wound start, what was the cause?     After being in the hospital for TAVR and PM placement  7. PAIN: Do you have any pain?  If Yes, ask: How bad is the pain?  (e.Moreno., Scale 1-10; mild, moderate, or severe)     Mildly sore  8. FEVER: Do you have a fever? If Yes, ask: What is your temperature, how was it measured, and when did it start?     Denies  9. OTHER SYMPTOMS: Do you have any other symptoms? (e.Moreno., shaking chills, weakness, rash elsewhere on body)     Denies any other acute issues.  Protocols used: Wound Infection Suspected-A-AH

## 2024-06-22 ENCOUNTER — Ambulatory Visit: Admitting: Family Medicine

## 2024-06-22 ENCOUNTER — Encounter: Payer: Self-pay | Admitting: Family Medicine

## 2024-06-22 VITALS — BP 134/75 | HR 60 | Ht 61.0 in | Wt 83.0 lb

## 2024-06-22 DIAGNOSIS — T148XXD Other injury of unspecified body region, subsequent encounter: Secondary | ICD-10-CM | POA: Insufficient documentation

## 2024-06-22 DIAGNOSIS — F17200 Nicotine dependence, unspecified, uncomplicated: Secondary | ICD-10-CM | POA: Diagnosis not present

## 2024-06-22 NOTE — Assessment & Plan Note (Signed)
 Presents for evaluation of a wound from pacemaker insertion one month ago. Healing is delayed, likely secondary to poor vasculature from smoking history. Examination today does not show signs of infection. The scab is intact.     -   Discontinue band-aid use.     -   Advised to wash the area with soap and water  twice daily.     -   Apply a thin layer of Vaseline over the wound and the pacemaker generator site after washing.     -   Allow the area to be open to the air.     -   Advised on signs of infection requiring follow-up: increased redness, purulent drainage, swelling, or increased tenderness.

## 2024-06-22 NOTE — Assessment & Plan Note (Signed)
 Patient is a current smoker and reports difficulty with quitting.     -   Advised to call the 1-800 number for smoking cessation resources.     -   Follow-up scheduled for January.

## 2024-06-22 NOTE — Patient Instructions (Signed)
 It was nice to see you today,  We addressed the following topics today: - Leave the band-aid off the wound. - Wash the area gently with soap and water  twice a day (morning and evening). - After washing and drying, put a little bit of Vaseline over the wound. - You can also put Vaseline on the dry skin over your pacemaker site. - Let the wound air dry. - If the wound looks worse (redness spreads, it starts to ooze pus, it swells, or becomes very tender), please send us  a message or call the office. - I encourage you to try the 1-800 number for help with quitting smoking. They may be able to send you resources.  Have a great day,  Rolan Slain, MD

## 2024-06-22 NOTE — Progress Notes (Signed)
   Acute Office Visit  Subjective:     Patient ID: Christie Moreno, female    DOB: January 09, 1956, 68 y.o.   MRN: [id number]  Chief Complaint  Patient presents with   Arm Problem    HPI Patient is in today for   Subjective - Wound follow-up. Reports a non-healing wound on left forearm from TAVR procedure. . Procedure was on 05/24/2024 following a TAVR on 05/22/2024. Describes a tail that came off last night (06/21/2024) which appears to be part of the eschar.Not putting anything on it, just washing around it, as previously instructed. Denies fevers or nausea. Also notes some redness at the pacemaker generator site.  Medications No new medications discussed.  PMH, PSH, FH, Social Hx PSHx: TAVR on 05/22/2024, pacemaker insertion on 05/24/2024. Social Hx: Smoker, reports attempting to quit but finds it challenging. Has not yet used the 1-800 quit now resource for smoking cessation aids.  ROS Constitutional: Denies fever. GI: Denies nausea. Skin: Reports slow healing, redness, and a palpable subdermal nodule near catheter insertion site in left arm.   ROS      Objective:    BP 134/75   Pulse 60   Ht 5' 1 (1.549 m)   Wt 83 lb (37.6 kg)   BMI 15.68 kg/m    Physical Exam Gen: alert, oriented Pulm: no respiratory distress Psych: pleasant affect SKIN: Examination of the wound from the catheter insertion reveals a healing site with a scab. No signs of infection such as spreading erythema, purulent drainage, swelling, or significant tenderness. Palpable subcutaneous knot noted, appears to be superficial. Examination of the pacemaker site shows dry skin without signs of infection.  No results found for any visits on 06/22/24.      Assessment & Plan:   Delayed wound healing Assessment & Plan:     Presents for evaluation of a wound from pacemaker insertion one month ago. Healing is delayed, likely secondary to poor vasculature from smoking history. Examination today  does not show signs of infection. The scab is intact.     -   Discontinue band-aid use.     -   Advised to wash the area with soap and water  twice daily.     -   Apply a thin layer of Vaseline over the wound and the pacemaker generator site after washing.     -   Allow the area to be open to the air.     -   Advised on signs of infection requiring follow-up: increased redness, purulent drainage, swelling, or increased tenderness.   Tobacco dependence Assessment & Plan:     Patient is a current smoker and reports difficulty with quitting.     -   Advised to call the 1-800 number for smoking cessation resources.     -   Follow-up scheduled for January.      Return if symptoms worsen or fail to improve.  Toribio MARLA Slain, MD

## 2024-07-05 ENCOUNTER — Ambulatory Visit: Attending: Student in an Organized Health Care Education/Training Program

## 2024-07-05 DIAGNOSIS — I442 Atrioventricular block, complete: Secondary | ICD-10-CM

## 2024-07-06 LAB — CUP PACEART REMOTE DEVICE CHECK
Battery Remaining Longevity: 126 mo
Battery Remaining Percentage: 100 %
Brady Statistic RA Percent Paced: 9 %
Brady Statistic RV Percent Paced: 100 %
Date Time Interrogation Session: 20251204032100
Implantable Lead Connection Status: 753985
Implantable Lead Connection Status: 753985
Implantable Lead Implant Date: 20251023
Implantable Lead Implant Date: 20251023
Implantable Lead Location: 753859
Implantable Lead Location: 753860
Implantable Lead Model: 7841
Implantable Lead Model: 7842
Implantable Lead Serial Number: 1522134
Implantable Lead Serial Number: 1674678
Implantable Pulse Generator Implant Date: 20251023
Lead Channel Impedance Value: 654 Ohm
Lead Channel Impedance Value: 714 Ohm
Lead Channel Pacing Threshold Amplitude: 0.5 V
Lead Channel Pacing Threshold Amplitude: 0.8 V
Lead Channel Pacing Threshold Pulse Width: 0.4 ms
Lead Channel Pacing Threshold Pulse Width: 0.4 ms
Lead Channel Setting Pacing Amplitude: 3.5 V
Lead Channel Setting Pacing Amplitude: 3.5 V
Lead Channel Setting Pacing Pulse Width: 0.4 ms
Lead Channel Setting Sensing Sensitivity: 3 mV
Pulse Gen Serial Number: 203839
Zone Setting Status: 755011

## 2024-07-10 NOTE — Progress Notes (Signed)
 Remote PPM Transmission

## 2024-07-12 ENCOUNTER — Ambulatory Visit: Admitting: Physician Assistant

## 2024-07-12 ENCOUNTER — Ambulatory Visit: Admission: RE | Admit: 2024-07-12 | Discharge: 2024-07-12 | Attending: Internal Medicine

## 2024-07-12 VITALS — BP 130/70 | HR 60 | Ht 61.5 in | Wt 84.0 lb

## 2024-07-12 DIAGNOSIS — I739 Peripheral vascular disease, unspecified: Secondary | ICD-10-CM

## 2024-07-12 DIAGNOSIS — I10 Essential (primary) hypertension: Secondary | ICD-10-CM | POA: Diagnosis not present

## 2024-07-12 DIAGNOSIS — Z720 Tobacco use: Secondary | ICD-10-CM | POA: Diagnosis not present

## 2024-07-12 DIAGNOSIS — Z952 Presence of prosthetic heart valve: Secondary | ICD-10-CM | POA: Insufficient documentation

## 2024-07-12 DIAGNOSIS — Z950 Presence of cardiac pacemaker: Secondary | ICD-10-CM

## 2024-07-12 DIAGNOSIS — I5020 Unspecified systolic (congestive) heart failure: Secondary | ICD-10-CM | POA: Diagnosis not present

## 2024-07-12 LAB — ECHOCARDIOGRAM COMPLETE
AV Mean grad: 5 mmHg
AV Peak grad: 9.6 mmHg
Ao pk vel: 1.55 m/s
Area-P 1/2: 2.49 cm2
MV M vel: 5.78 m/s
MV Peak grad: 133.6 mmHg
S' Lateral: 2.5 cm

## 2024-07-12 NOTE — Progress Notes (Unsigned)
 [facility]   MULTIDISCIPLINARY HEART VALVE CLINIC                                     Cardiology Office Note:    Date:  07/13/2024   ID:  Christie Moreno, DOB [birth year], MRN [id number]  PCP:  Gayle Saddie FALCON, PA-C  [facility] Cardiologist:  Dorn Lesches, MD  [facility] Structural heart: Lurena MARLA Red, MD [facility] Electrophysiologist:  Donnice DELENA Primus, MD   Referring MD: Gayle Saddie FALCON, PA-C   1 month s/p TAVR  History of Present Illness:    Christie Moreno is a 67 y.o. 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  She has a history of mesenteric ischemia s/p SMA balloon angioplasty and stenting in 07/2023 and left lower extremity ischemia due to occlusion of left common femoral and external iliac arteries. Echocardiogram 03/12/2024 showed EF 55% and severe LFLFG AS with mean grad 18.0 mmHg, Vmax 2.70 m/s, AVA 0.83 cm2, DVI 0.28, SVI 37, Mild MS, Mild-mod AI. Cardiac catheterization 04/09/2024 showed mild to moderate nonobstructive coronary disease that was not significantly changed from 2023 study and continue medical treatment was recommended. S/p TAVR with a 26 mm Edwards Sapien 3 Ultra Resilia THV via the TF approach on 05/22/24. Post operative echo showed EF 60%, mild LVH, severe MAC with mid MS, and normally functioning TAVR with a mean gradient of 4.8 mmHg and no PVL. She later developed CHB and underwent BSCI DC/LBBAP PPM with selective LB capture on 05/24/24 by Dr. Primus. BP was elevated during admission and Coreg  increased from 3.125mg  BID to 6.25mg  BID. Had new LE edema and tenderness, venous dopplers negative for DVT.   Today the patient presents to clinic for follow up. Here alone. Hasn't been doing a lot but knows she needs to get more  active. No CP or SOB. Chronic mild left LE edema, but no orthopnea or PND. No dizziness or syncope. No blood in stool or urine. No palpitations. Still smoking two cigarettes a day. She has noticed that she has fallen asleep after meals and told to take her BP with these episodes but hasn't. Will discuss with neurology as she has a history of seizures.    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 Red and Dr. Lucas   Seizures [facility])    none in years, recently had medication change   Thyroid disease      Current Medications: Current Meds  Medication Sig   albuterol  (PROVENTIL ) (2.5 MG/3ML) 0.083% nebulizer solution Take 3 mLs (2.5 mg total) by nebulization every 6 (six) hours as needed for wheezing or shortness of breath.   albuterol  (VENTOLIN  HFA) 108 (90 Base) MCG/ACT inhaler Inhale 2 puffs into the lungs  every 4 (four) hours as needed for wheezing or shortness of breath.   aspirin  81 MG EC tablet Take 1 tablet (81 mg total) by mouth daily. Swallow whole.   atorvastatin  (LIPITOR ) 80 MG tablet Take 1 tablet (80 mg total) by mouth daily at 6 PM.   budeson-glycopyrrolate-formoterol (BREZTRI  AEROSPHERE) 160-9-4.8 MCG/ACT AERO inhaler Inhale 2 puffs into the lungs 2 (two) times daily.   carvedilol  (COREG ) 6.25 MG tablet Take 1 tablet (6.25 mg total) by mouth 2 (two) times daily with a meal. TAKE 1 TABLET(3.125 MG) BY MOUTH TWICE DAILY WITH A MEAL   clopidogrel  (PLAVIX ) 75 MG tablet Take 1 tablet (75 mg total) by mouth daily.   estradiol (ESTRACE) 0.01 % CREA vaginal cream Place  1 Applicatorful vaginally at bedtime.   estrogens , conjugated, (PREMARIN ) 1.25 MG tablet Take 1.25 mg by mouth daily.   feeding supplement (ENSURE ENLIVE / ENSURE PLUS) LIQD Take 237 mLs by mouth 2 (two) times daily between meals. (Patient taking differently: Take 237 mLs by mouth 2 (two) times daily between meals. Pt needs to get more, but she is taking)   furosemide  (LASIX ) 40 MG tablet Take 0.5 tablets (20 mg total) by mouth daily. TAKE ONE-HALF (1/2) TABLET (20 MG TOTAL) DAILY   lamoTRIgine  (LAMICTAL ) 100 MG tablet Take 1 tablet (100 mg total) by mouth 2 (two) times daily.   levothyroxine  (SYNTHROID ) 25 MCG tablet Take 1 tablet (25 mcg total) by mouth daily before breakfast.   potassium chloride  SA (KLOR-CON  M) 20 MEQ tablet TAKE 1 TABLET DAILY (KEEP UPCOMING APPOINTMENT FOR FUTURE REFILLS)   progesterone  (PROMETRIUM ) 200 MG capsule Take 200 mg by mouth at bedtime.      ROS:   Please see the history of present illness.    All other systems reviewed and are negative.  EKGs       Risk Assessment/Calculations:           Physical Exam:    VS:  BP 130/70   Pulse 60   Ht 5' 1.5 (1.562 m)   Wt 84 lb (38.1 kg)   SpO2 98%   BMI 15.61 kg/m     Wt Readings from Last 3 Encounters:  07/12/24 84 lb (38.1 kg)  06/22/24 83 lb (37.6 kg)  06/07/24 83 lb 1.9 oz (37.7 kg)     GEN: frail, thin woman. chronically ill appearing. NECK: No JVD.  CARDIAC: RRR, no murmurs, rubs, gallops RESPIRATORY:  Clear to auscultation without rales, wheezing or rhonchi  ABDOMEN: Soft, non-tender, non-distended EXTREMITIES:  1+ bilateral edema L>R, chronic venous stasis changes.   ASSESSMENT:    1. S/P TAVR (transcatheter aortic valve replacement)   2. Pacemaker   3. PAD (peripheral artery disease)   4. Tobacco abuse   5. Essential hypertension   6. Heart failure with improved ejection fraction (HFimpEF) (HCC)      PLAN:    In order of problems listed above:  Severe AS s/p TAVR:  -- Echo  today showed EF 55%, mild MR, normally functioning TAVR with a mean gradient of 5 mmHg and no PVL. -- NYHA class II symptoms.  -- SBE prophylaxis discussed; the patient is edentulous and does not go to the dentist.   -- Continue on chronic DAPT with Asprin 81mg  daily and Plavix  75mg  daily. -- Restrictions lifted - encouraged to participate in cardiac rehab.  -- I will see back for 1 year echo and OV.  CHB s/p PPM: -- S/p LEFT sided BSCI DC/LBBAP PPM  with selective LB capture on 05/24/24 by Dr. Almetta.  -- Continue follow up with EP.   PAD: -- Mesenteric ischemia s/p SMA balloon angioplasty and stenting in 07/2023. -- Patient may require surgical revascularization for her left lower extremity  -- No rest pain.  -- Continue f/u w/ Dr. Pearline. -- Continue on chronic DAPT with Asprin 81mg  daily and Plavix  75mg  daily. -- Continue atorvastatin  80mg  daily.    CAD: -- Cardiac catheterization on 04/09/2024 showing mild to moderate nonobstructive coronary disease that was not significantly changed compared to the study in 2023 and continued medical treatment was recommended.  -- Continue on chronic DAPT with Asprin 81mg  daily and Plavix  75mg  daily. -- Continue atorvastatin  80mg  daily.    Tobacco abuse: -- Counseled on cessation.  -- Down to 2 cigs a day.    HTN: -- BP well controlled today. Initially elevated but 130/70 on my personal recheck at the end of the visit.  -- Continue Coreg   6.25mg  BID. -- Continue Lasix  20mg  daily and KCL 20 meq daily.   HFimpEF: -- EF 55% -- Continue Coreg   6.25mg  BID. -- Continue Lasix  20mg  daily and KCL 20 meq daily. -- Getting labs through PCP today.     Cardiac Rehabilitation Eligibility Assessment  The patient is ready to start cardiac rehabilitation from a cardiac standpoint.     Medication Adjustments/Labs and Tests Ordered: Current medicines are reviewed at length with the patient today.  Concerns regarding medicines are outlined above.   Orders Placed This Encounter  Procedures   ECHOCARDIOGRAM COMPLETE   No orders of the defined types were placed in this encounter.   Patient Instructions  Medication Instructions:  Your physician recommends that you continue on your current medications as directed. Please refer to the Current Medication list given to you today.  *If you need a refill on your cardiac medications before your next appointment, please call your pharmacy*  Lab Work: None needed If you have labs (blood work) drawn today and your tests are completely normal, you will receive your results only by: MyChart Message (if you have MyChart) OR A paper copy in the mail If you have any lab test that is abnormal or we need to change your treatment, we will call you to review the results.  Testing/Procedures: 05/20/25 Your physician has requested that you have an echocardiogram. Echocardiography is a painless test that uses sound waves to create images of your heart. It provides your doctor with information about the size and shape of your heart and how well your hearts chambers and valves are working. This procedure takes approximately one hour. There are no restrictions for this procedure. Please do NOT wear cologne, perfume, aftershave, or lotions (deodorant is allowed). Please arrive 15 minutes prior to your appointment time.  Please note: We ask at that you not bring children with you during ultrasound (echo/ vascular) testing. Due to room size and safety concerns, children are not allowed in the ultrasound rooms during exams. Our front office staff cannot provide observation of children in our lobby area while testing is being conducted. An adult accompanying a patient to their appointment will only be allowed in the ultrasound room at the discretion of the ultrasound technician under special circumstances. We apologize for any inconvenience.   Follow-Up: At [facility], you and your health needs are our  priority.  As part of our continuing mission to provide you with exceptional heart care, our providers are all part of one team.  This team includes your primary Cardiologist (physician) and Advanced Practice Providers or APPs (Physician Assistants and Nurse Practitioners) who all work together to provide you with the care you need, when you need it.  Your next appointment:   6 month(s)  Provider:   Dorn Lesches, MD    We recommend signing up for the patient portal called MyChart.  Sign up information is provided on this After Visit Summary.  MyChart is used to connect with patients for Virtual Visits (Telemedicine).  Patients are able to view lab/test results, encounter notes, upcoming appointments, etc.  Non-urgent messages can be sent to your provider as well.   To learn more about what you can do with MyChart, go to [email or web address].          Signed, Lamarr Hummer, PA-C  07/13/2024 12:45 PM    [facility]

## 2024-07-12 NOTE — Patient Instructions (Signed)
 Medication Instructions:  Your physician recommends that you continue on your current medications as directed. Please refer to the Current Medication list given to you today.  *If you need a refill on your cardiac medications before your next appointment, please call your pharmacy*  Lab Work: None needed If you have labs (blood work) drawn today and your tests are completely normal, you will receive your results only by: MyChart Message (if you have MyChart) OR A paper copy in the mail If you have any lab test that is abnormal or we need to change your treatment, we will call you to review the results.  Testing/Procedures: 05/20/25 Your physician has requested that you have an echocardiogram. Echocardiography is a painless test that uses sound waves to create images of your heart. It provides your doctor with information about the size and shape of your heart and how well your hearts chambers and valves are working. This procedure takes approximately one hour. There are no restrictions for this procedure. Please do NOT wear cologne, perfume, aftershave, or lotions (deodorant is allowed). Please arrive 15 minutes prior to your appointment time.  Please note: We ask at that you not bring children with you during ultrasound (echo/ vascular) testing. Due to room size and safety concerns, children are not allowed in the ultrasound rooms during exams. Our front office staff cannot provide observation of children in our lobby area while testing is being conducted. An adult accompanying a patient to their appointment will only be allowed in the ultrasound room at the discretion of the ultrasound technician under special circumstances. We apologize for any inconvenience.   Follow-Up: At [facility], you and your health needs are our priority.  As part of our continuing mission to provide you with exceptional heart care, our providers are all part of one team.  This team includes your primary  Cardiologist (physician) and Advanced Practice Providers or APPs (Physician Assistants and Nurse Practitioners) who all work together to provide you with the care you need, when you need it.  Your next appointment:   6 month(s)  Provider:   Dorn Lesches, MD    We recommend signing up for the patient portal called MyChart.  Sign up information is provided on this After Visit Summary.  MyChart is used to connect with patients for Virtual Visits (Telemedicine).  Patients are able to view lab/test results, encounter notes, upcoming appointments, etc.  Non-urgent messages can be sent to your provider as well.   To learn more about what you can do with MyChart, go to [email or web address].

## 2024-07-25 ENCOUNTER — Ambulatory Visit: Payer: Self-pay | Admitting: Student in an Organized Health Care Education/Training Program

## 2024-08-01 ENCOUNTER — Telehealth (HOSPITAL_COMMUNITY): Payer: Self-pay

## 2024-08-01 NOTE — Telephone Encounter (Signed)
 Called patient to schedule cardiac rehab, patient states she does not have transportation and will not be able to attend. Informed patient she can call us  back to schedule when she has transportation set up.  Closing referral.

## 2024-08-06 DIAGNOSIS — E039 Hypothyroidism, unspecified: Secondary | ICD-10-CM

## 2024-08-07 LAB — COMPREHENSIVE METABOLIC PANEL WITH GFR
ALT: 17 IU/L (ref 0–32)
AST: 25 IU/L (ref 0–40)
Albumin: 3.9 g/dL (ref 3.9–4.9)
Alkaline Phosphatase: 86 IU/L (ref 49–135)
BUN/Creatinine Ratio: 33 — ABNORMAL HIGH (ref 12–28)
BUN: 30 mg/dL — ABNORMAL HIGH (ref 8–27)
Bilirubin Total: <0.2 mg/dL (ref 0.0–1.2)
CO2: 21 mmol/L (ref 20–29)
Calcium: 9.4 mg/dL (ref 8.7–10.3)
Chloride: 99 mmol/L (ref 96–106)
Creatinine, Ser: 0.91 mg/dL (ref 0.57–1.00)
Globulin, Total: 2.6 g/dL (ref 1.5–4.5)
Glucose: 78 mg/dL (ref 70–99)
Potassium: 4.5 mmol/L (ref 3.5–5.2)
Sodium: 139 mmol/L (ref 134–144)
Total Protein: 6.5 g/dL (ref 6.0–8.5)
eGFR: 68 mL/min/1.73

## 2024-08-07 LAB — CBC WITH DIFFERENTIAL/PLATELET
Basophils Absolute: 0 x10E3/uL (ref 0.0–0.2)
Basos: 0 %
EOS (ABSOLUTE): 0.3 x10E3/uL (ref 0.0–0.4)
Eos: 4 %
Hematocrit: 39.5 % (ref 34.0–46.6)
Hemoglobin: 12 g/dL (ref 11.1–15.9)
Immature Grans (Abs): 0 x10E3/uL (ref 0.0–0.1)
Immature Granulocytes: 0 %
Lymphocytes Absolute: 1.5 x10E3/uL (ref 0.7–3.1)
Lymphs: 20 %
MCH: 23.8 pg — ABNORMAL LOW (ref 26.6–33.0)
MCHC: 30.4 g/dL — ABNORMAL LOW (ref 31.5–35.7)
MCV: 78 fL — ABNORMAL LOW (ref 79–97)
Monocytes Absolute: 0.8 x10E3/uL (ref 0.1–0.9)
Monocytes: 11 %
Neutrophils Absolute: 4.8 x10E3/uL (ref 1.4–7.0)
Neutrophils: 65 %
Platelets: 215 x10E3/uL (ref 150–450)
RBC: 5.05 x10E6/uL (ref 3.77–5.28)
RDW: 17.2 % — ABNORMAL HIGH (ref 11.7–15.4)
WBC: 7.5 x10E3/uL (ref 3.4–10.8)

## 2024-08-07 LAB — LIPID PANEL
Chol/HDL Ratio: 1.7 ratio (ref 0.0–4.4)
Cholesterol, Total: 163 mg/dL (ref 100–199)
HDL: 96 mg/dL
LDL Chol Calc (NIH): 54 mg/dL (ref 0–99)
Triglycerides: 70 mg/dL (ref 0–149)
VLDL Cholesterol Cal: 13 mg/dL (ref 5–40)

## 2024-08-07 LAB — HEMOGLOBIN A1C
Est. average glucose Bld gHb Est-mCnc: 126 mg/dL
Hgb A1c MFr Bld: 6 % — ABNORMAL HIGH (ref 4.8–5.6)

## 2024-08-07 LAB — TSH: TSH: 2.72 u[IU]/mL (ref 0.450–4.500)

## 2024-08-08 ENCOUNTER — Ambulatory Visit: Payer: Self-pay

## 2024-08-10 ENCOUNTER — Ambulatory Visit

## 2024-08-10 VITALS — BP 131/70 | HR 57 | Temp 97.4°F | Ht 61.5 in | Wt 85.4 lb

## 2024-08-10 DIAGNOSIS — E782 Mixed hyperlipidemia: Secondary | ICD-10-CM

## 2024-08-10 DIAGNOSIS — Z23 Encounter for immunization: Secondary | ICD-10-CM | POA: Diagnosis not present

## 2024-08-10 DIAGNOSIS — F17200 Nicotine dependence, unspecified, uncomplicated: Secondary | ICD-10-CM | POA: Diagnosis not present

## 2024-08-10 DIAGNOSIS — Z95811 Presence of heart assist device: Secondary | ICD-10-CM

## 2024-08-10 DIAGNOSIS — G40209 Localization-related (focal) (partial) symptomatic epilepsy and epileptic syndromes with complex partial seizures, not intractable, without status epilepticus: Secondary | ICD-10-CM

## 2024-08-10 DIAGNOSIS — K559 Vascular disorder of intestine, unspecified: Secondary | ICD-10-CM

## 2024-08-10 DIAGNOSIS — J441 Chronic obstructive pulmonary disease with (acute) exacerbation: Secondary | ICD-10-CM

## 2024-08-10 DIAGNOSIS — E039 Hypothyroidism, unspecified: Secondary | ICD-10-CM

## 2024-08-10 DIAGNOSIS — Z0000 Encounter for general adult medical examination without abnormal findings: Secondary | ICD-10-CM | POA: Diagnosis not present

## 2024-08-10 DIAGNOSIS — E876 Hypokalemia: Secondary | ICD-10-CM | POA: Diagnosis not present

## 2024-08-10 DIAGNOSIS — R627 Adult failure to thrive: Secondary | ICD-10-CM | POA: Diagnosis not present

## 2024-08-10 MED ORDER — CLOPIDOGREL BISULFATE 75 MG PO TABS: 75.0000 mg | ORAL_TABLET | Freq: Every day | ORAL | 3 refills | Status: DC

## 2024-08-10 MED ORDER — HYDROXYZINE HCL 25 MG PO TABS: 12.5000 mg | ORAL_TABLET | Freq: Three times a day (TID) | ORAL | 0 refills | Status: AC | PRN

## 2024-08-10 MED ORDER — CLOPIDOGREL BISULFATE 75 MG PO TABS: 75.0000 mg | ORAL_TABLET | Freq: Every day | ORAL | 3 refills | Status: AC

## 2024-08-10 NOTE — Patient Instructions (Signed)
 VISIT SUMMARY: During your visit, we reviewed your medications and overall health. Your lab results are stable, and your cholesterol and anemia have improved. We administered shingles and tetanus vaccines today and scheduled a follow-up in 5-6 months.  YOUR PLAN: CHRONIC OBSTRUCTIVE PULMONARY DISEASE (COPD): You have variable breathing symptoms and use Breztri  and albuterol  as needed. -Continue using Breztri  two puffs twice daily. -Use albuterol  inhaler as needed based on symptoms.  HYPOTHYROIDISM: Your thyroid function is well-controlled with your current medication. -Continue your current thyroid medication regimen.  ANEMIA: Your anemia has improved with normal hemoglobin levels and improving red blood cell size. -Continue monitoring your blood counts.  PREDIABETES: Your A1c is stable at 6.0, indicating prediabetes. You have a family history of type 2 diabetes. -Continue monitoring your A1c levels.  PRURITUS: You are experiencing itching, possibly due to dry skin. -Refilled hydroxyzine  prescription. -Use CeraVe cream for dry skin.  GENERAL HEALTH MAINTENANCE: Your overall health is well-managed with normal kidney, liver function, electrolytes, cholesterol, and thyroid function. Blood pressure is well-controlled. -Continue current medications including thyroid medication, lamotrigine , Lasix , potassium, Plavix , estrogen, Prometrium , carvedilol , atorvastatin , and Breztri . -Refilled clopidogrel  for 30 days at [facility] and 90 days at [facility]. -Administered shingles and tetanus vaccines today. -Plan for pneumonia vaccine at future nurse visit. -Scheduled follow-up in 5-6 months.  If you have any problems before your next visit feel free to message me via MyChart (minor issues or questions) or call the office, otherwise you may reach out to schedule an office visit.  Thank you! Saddie Sacks, PA-C

## 2024-08-14 DIAGNOSIS — Z0000 Encounter for general adult medical examination without abnormal findings: Secondary | ICD-10-CM | POA: Insufficient documentation

## 2024-08-14 NOTE — Assessment & Plan Note (Signed)
 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 Symptoms stable.  Is being closely monitored by cardiology.

## 2024-08-14 NOTE — Assessment & Plan Note (Signed)
-   History of mesenteric ischemia with stenting in December 2024. Currently on dual antiplatelet therapy with aspirin  and clopidogrel .I do not see mention of desired duration for DAPT, so will continue for 1 year - Continue aspirin  and clopidogrel  for at least one year post-stenting (until December 2025). - Continue regular follow ups and ultrasounds with vascular specialist.

## 2024-08-14 NOTE — Assessment & Plan Note (Signed)
 Stable on Breztri . She reports mostly using Breztri  only once per day instead of twice. Continuing to work toward smoking cessation efforts.  - Continue Breztri  two puffs twice daily. - Use albuterol  inhaler as needed based on symptoms.

## 2024-08-14 NOTE — Progress Notes (Signed)
 "  Established Patient Office Visit  Subjective   Patient ID: Christie Moreno, female    DOB: Jul 18, 1956  Age: 69 y.o. MRN: [id number]  Chief Complaint  Patient presents with   Medical Management of Chronic Issues    HPI  History of Present Illness   Christie Moreno is a 69 year old female who presents for a routine follow-up and medication review.  Laboratory findings - Kidney and liver function tests are normal. - Electrolytes are stable. - Calcium  levels, previously low, are now normal. - Cholesterol panel is excellent, with low cholesterol levels. - Red blood cell count and hemoglobin have improved after previous anemia. - Red blood cell size remains abnormal but is improving. - Hemoglobin A1c is stable at 6.0, slightly increased from 5.9 last year, remaining in the prediabetic range.  Medication management - Currently taking levothyroxine  25 mcg daily, lamotrigine  100 mg twice daily, Lasix  half tablet daily (increased to a full tablet if left leg swelling occurs), potassium daily, Plavix , estrogen, Prometrium , carvedilol  6.25 mg twice daily, atorvastatin , and Breztri  for COPD. - Uses albuterol  for breathing treatments and inhaler as needed.  Peripheral edema - Occasional swelling in the left leg. - Monitors weight daily to manage fluid retention. - Increases Lasix  dose to a full tablet if swelling occurs which resolves the swelling   Pruritus - Experiences itching, attributed to dry skin. - Uses hydroxyzine  as needed for symptom relief but has not had this medication recently. Requesting refill.   Seizure disorder - Neurology follow-up scheduled for ongoing management of seizures.  Chronic obstructive pulmonary disease (copd) - Uses Breztri  and albuterol  inhaler as needed for respiratory symptoms.  Vascular disease - Upcoming appointments with cardiology and vascular surgery for follow-up of mesenteric artery occlusion,  including ultrasound.     ROS Per  HPI.    Objective:     BP 131/70   Pulse (!) 57   Temp (!) 97.4 F (36.3 C) (Oral)   Ht 5' 1.5 (1.562 m)   Wt 85 lb 6.4 oz (38.7 kg)   SpO2 99%   BMI 15.87 kg/m    Physical Exam Constitutional:      General: She is not in acute distress.    Appearance: Normal appearance.  Cardiovascular:     Rate and Rhythm: Normal rate and regular rhythm.     Heart sounds: Normal heart sounds. No murmur heard.    No friction rub. No gallop.  Pulmonary:     Effort: Pulmonary effort is normal. No respiratory distress.     Breath sounds: Normal breath sounds.  Musculoskeletal:        General: No swelling.     Cervical back: Neck supple.  Lymphadenopathy:     Cervical: No cervical adenopathy.  Skin:    General: Skin is warm and dry.  Neurological:     General: No focal deficit present.     Mental Status: She is alert.  Psychiatric:        Mood and Affect: Mood normal.        Behavior: Behavior normal.        Thought Content: Thought content normal.      No results found for any visits on 08/10/24.  Last CBC Lab Results  Component Value Date   WBC 7.5 08/06/2024   HGB 12.0 08/06/2024   HCT 39.5 08/06/2024   MCV 78 (L) 08/06/2024   MCH 23.8 (L) 08/06/2024   RDW 17.2 (H) 08/06/2024   PLT 215  08/06/2024   Last metabolic panel Lab Results  Component Value Date   GLUCOSE 78 08/06/2024   NA 139 08/06/2024   K 4.5 08/06/2024   CL 99 08/06/2024   CO2 21 08/06/2024   BUN 30 (H) 08/06/2024   CREATININE 0.91 08/06/2024   EGFR 68 08/06/2024   CALCIUM  9.4 08/06/2024   PHOS 3.5 10/29/2023   PROT 6.5 08/06/2024   ALBUMIN 3.9 08/06/2024   LABGLOB 2.6 08/06/2024   AGRATIO 1.7 06/18/2022   BILITOT <0.2 08/06/2024   ALKPHOS 86 08/06/2024   AST 25 08/06/2024   ALT 17 08/06/2024   ANIONGAP 6 05/25/2024   Last lipids Lab Results  Component Value Date   CHOL 163 08/06/2024   HDL 96 08/06/2024   LDLCALC 54 08/06/2024   TRIG 70 08/06/2024   CHOLHDL 1.7 08/06/2024   Last  hemoglobin A1c Lab Results  Component Value Date   HGBA1C 6.0 (H) 08/06/2024   Last thyroid functions Lab Results  Component Value Date   TSH 2.720 08/06/2024   FREET4 1.46 02/02/2023   Last vitamin D No results found for: 25OHVITD2, 25OHVITD3, VD25OH    The ASCVD Risk score (Arnett DK, et al., 2019) failed to calculate for the following reasons:   Risk score cannot be calculated because patient has a medical history suggesting prior/existing ASCVD   * - Cholesterol units were assumed    Assessment & Plan:   Encounter for vaccination -     Varicella-zoster vaccine IM -     Tdap vaccine greater than or equal to 7yo IM  Presence of heart assist device [facility]) Assessment & Plan: 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 Symptoms stable.  Is being closely monitored by cardiology.    COPD with acute exacerbation (HCC) Assessment & Plan: Stable on Breztri . She reports mostly using Breztri  only once per day instead of twice. Continuing to work toward smoking cessation efforts.  - Continue Breztri  two puffs twice daily. - Use albuterol  inhaler as needed based on symptoms.   Partial symptomatic epilepsy with complex partial seizures, not intractable, without status epilepticus (HCC) Assessment & Plan: On Lamictal  100 mg twice daily. Follows with neurology.    Tobacco dependence Assessment & Plan:     Patient is a current smoker and reports difficulty with quitting.     -   Advised to call the 1-800 number for smoking cessation resources. - She is still smoking 2-3 cigarettes per day but has been able to successful reduce the #. Patient has either tried and failed nicotine  replacement or it has been too expensive for her. Wellbutrin contraindicated due to hx of seizures. Not interested in Chantix at this time.  Will cont to monitor and encourage complete cessation at next OV    Acquired hypothyroidism Assessment & Plan: Stable. Continue  Synthroid  25 mcg daily.    Hypokalemia Assessment & Plan: K+ level within normal range. Continue daily Klor-Con  supplement.    Mixed hyperlipidemia Assessment & Plan: Last lipid panel: LDL 54, HDL 96, Trig 70. At goal. Continue atorvastatin  80 mg daily. LFTs within normal range.    FTT (failure to thrive ) in adult Assessment & Plan: BMI improved from 13.79 to 15.87 in just over a year. Continue with Ensure protein supplementation. Can consider mirtazapine as an appetite stimulant in the future if needed.    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 Currently on dual antiplatelet therapy with  aspirin  and clopidogrel .I do not see mention of desired duration for DAPT, so will continue for 1 year - Continue aspirin  and clopidogrel  for at least one year post-stenting (until December 2025). - Continue regular follow ups and ultrasounds with vascular specialist.    Healthcare maintenance Assessment & Plan: Patient is optimized on medication with normal kidney, liver function, electrolytes, cholesterol, and thyroid function. A1c stable at 6.0, indicating prediabetes. Blood pressure well-controlled. Hemoglobin normal, red blood cell size improving. - Continue current medications including thyroid medication, lamotrigine , Lasix , potassium, Plavix , estrogen, Prometrium , carvedilol , atorvastatin , and Breztri . - Refilled clopidogrel  for 30 days at [facility] and 90 days at [facility]. - Administered shingles and tetanus vaccines today. - Plan for pneumonia vaccine at future nurse visit. - Scheduled follow-up in 5-6 months.   Other orders -     Clopidogrel  Bisulfate; Take 1 tablet (75 mg total) by mouth daily.  Dispense: 90 tablet; Refill: 3 -     hydrOXYzine  HCl; Take 0.5-1 tablets (12.5-25 mg total) by mouth 3 (three) times daily as needed for itching.  Dispense: 30 tablet; Refill: 0    Return in about 6 months (around  02/07/2025) for HTN, HLD, pre-DM, thyroid, COPD.    Saddie JULIANNA Sacks, PA-C "

## 2024-08-14 NOTE — Assessment & Plan Note (Signed)
 Patient is optimized on medication with normal kidney, liver function, electrolytes, cholesterol, and thyroid function. A1c stable at 6.0, indicating prediabetes. Blood pressure well-controlled. Hemoglobin normal, red blood cell size improving. - Continue current medications including thyroid medication, lamotrigine , Lasix , potassium, Plavix , estrogen, Prometrium , carvedilol , atorvastatin , and Breztri . - Refilled clopidogrel  for 30 days at [facility] and 90 days at [facility]. - Administered shingles and tetanus vaccines today. - Plan for pneumonia vaccine at future nurse visit. - Scheduled follow-up in 5-6 months.

## 2024-08-14 NOTE — Assessment & Plan Note (Signed)
"      Patient is a current smoker and reports difficulty with quitting.     -   Advised to call the 1-800 number for smoking cessation resources. - She is still smoking 2-3 cigarettes per day but has been able to successful reduce the #. Patient has either tried and failed nicotine  replacement or it has been too expensive for her. Wellbutrin contraindicated due to hx of seizures. Not interested in Chantix at this time.  Will cont to monitor and encourage complete cessation at next OV  "

## 2024-08-14 NOTE — Assessment & Plan Note (Signed)
 K+ level within normal range. Continue daily Klor-Con  supplement.

## 2024-08-14 NOTE — Assessment & Plan Note (Signed)
 Last lipid panel: LDL 54, HDL 96, Trig 70. At goal. Continue atorvastatin  80 mg daily. LFTs within normal range.

## 2024-08-14 NOTE — Assessment & Plan Note (Signed)
 On Lamictal  100 mg twice daily.  Follows with neurology.

## 2024-08-14 NOTE — Assessment & Plan Note (Signed)
 Stable.  Continue Synthroid 25 mcg daily.

## 2024-08-14 NOTE — Assessment & Plan Note (Signed)
 BMI improved from 13.79 to 15.87 in just over a year. Continue with Ensure protein supplementation. Can consider mirtazapine as an appetite stimulant in the future if needed.

## 2024-08-15 NOTE — Progress Notes (Signed)
 "  Patient ID: Christie Moreno, female   DOB: [birth year], 69 y.o.   MRN: [id number]  Reason for Consult: No chief complaint on file.   Referred by Gayle Saddie FALCON, PA-C  Subjective:     HPI Christie Moreno is a 69 y.o. female presenting for follow-up.  She underwent SMA intravascular lithotripsy and stenting on 07/25/2023.  She also has significant PAD with a chronically occluded left iliac system.  She underwent TAVR on 05/22/2024 and pacemaker placement on 10/23. She continues to deny rest pain although has discoloration of her left foot.  She also denies slow or nonhealing wounds.  She continues to deny postprandial pain and is tolerating regular diet.   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 Wendel and Dr. Lucas   Seizures [facility]    none in years, recently had medication change   Thyroid disease    Family History  Adopted: Yes  Problem Relation Age of Onset   Breast cancer Mother    Diabetes Mother    Stroke Mother    Heart Problems Father    Diabetes Sister    Kidney failure Sister    Breast cancer Maternal Aunt    Lung cancer Half-Sister    Colon cancer Neg Hx    Esophageal cancer Neg Hx    Epilepsy Neg Hx    Stomach cancer Neg Hx    Rectal cancer Neg Hx    Past Surgical History:  Procedure Laterality Date   COLONOSCOPY     INTRAOPERATIVE TRANSTHORACIC ECHOCARDIOGRAM N/A 05/22/2024   Procedure: ECHOCARDIOGRAM, TRANSTHORACIC;  Surgeon: Wendel Lurena POUR, MD;  Location: [facility] INVASIVE CV LAB;  Service: Cardiovascular;  Laterality: N/A;   PACEMAKER  IMPLANT N/A 05/24/2024   Procedure: PACEMAKER IMPLANT;  Surgeon: Almetta Donnice LABOR, MD;  Location: [facility] INVASIVE CV LAB;  Service: Cardiovascular;  Laterality: N/A;   PERIPHERAL INTRAVASCULAR LITHOTRIPSY  07/25/2023   Procedure: PERIPHERAL INTRAVASCULAR LITHOTRIPSY;  Surgeon: Pearline Norman RAMAN, MD;  Location: [facility] INVASIVE CV LAB;  Service: Cardiovascular;;   PERIPHERAL VASCULAR INTERVENTION  07/25/2023   Procedure: PERIPHERAL VASCULAR INTERVENTION;  Surgeon: Pearline Norman RAMAN, MD;  Location: [facility] INVASIVE CV LAB;  Service: Cardiovascular;;   RIGHT HEART CATH AND CORONARY ANGIOGRAPHY N/A 04/09/2024   Procedure: RIGHT HEART CATH AND CORONARY ANGIOGRAPHY;  Surgeon: Wendel Lurena POUR, MD;  Location: [facility] INVASIVE CV LAB;  Service: Cardiovascular;  Laterality: N/A;   RIGHT/LEFT HEART CATH AND CORONARY ANGIOGRAPHY N/A 11/05/2021   Procedure: RIGHT/LEFT HEART CATH AND CORONARY ANGIOGRAPHY;  Surgeon: Claudene Victory ORN, MD;  Location: [facility] INVASIVE CV LAB;  Service: Cardiovascular;  Laterality: N/A;   TEMPORARY PACEMAKER N/A 05/23/2024   Procedure: Coronary/Graft Acute MI Revascularization;  Surgeon: Ladona Heinz, MD;  Location: [facility] INVASIVE CV LAB;  Service: Cardiovascular;  Laterality: N/A;   TONSILLECTOMY     VISCERAL ANGIOGRAPHY N/A 07/25/2023   Procedure: VISCERAL ANGIOGRAPHY;  Surgeon: Pearline Norman RAMAN, MD;  Location: [facility] INVASIVE CV LAB;  Service: Cardiovascular;  Laterality: N/A;    Short Social History:  Social History   Tobacco Use   Smoking status: Some Days    Current packs/day: 0.25    Average packs/day: 1 pack/day for 52.4 years (52.1 ttl pk-yrs)    Types: Cigarettes    Start date: 11/09/1969    Last attempt to quit: 11/09/2021    Passive exposure: Current   Smokeless tobacco: Never   Tobacco comments:    08/31/2023 Patient smoke about 3 cigarettes daily    Currently 1 pack every 2-3 days    04/06/2022 patient states when she smokes its 1 cigarette    04/05/2023 Patient smokes 2 cigarettes some  days   Substance Use Topics   Alcohol use: Not Currently    Comment: socially    Allergies  Allergen Reactions   Jardiance  [Empagliflozin ]     UTIs   Nickel Itching    Ears itch when wearing earings made of nickel   Azithromycin Nausea And Vomiting   Penicillins Itching and Rash   Percocet [Oxycodone-Acetaminophen ] Rash    Current Outpatient Medications  Medication Sig Dispense Refill   albuterol  (PROVENTIL ) (2.5 MG/3ML) 0.083% nebulizer solution Take 3 mLs (2.5 mg total) by nebulization every 6 (six) hours as needed for wheezing or shortness of breath. 90 mL 12   albuterol  (VENTOLIN  HFA) 108 (90 Base) MCG/ACT inhaler Inhale 2 puffs into the lungs every 4 (four) hours as needed for wheezing or shortness of breath. 3 each 3   aspirin  81 MG EC tablet Take 1 tablet (81 mg total) by mouth daily. Swallow whole. 30 tablet 0   atorvastatin  (LIPITOR ) 80 MG tablet Take 1 tablet (80 mg total) by mouth daily at 6 PM. 90 tablet 1   budeson-glycopyrrolate-formoterol (BREZTRI  AEROSPHERE) 160-9-4.8 MCG/ACT AERO inhaler Inhale 2 puffs into the lungs 2 (two) times daily. 10.7 g 11   carvedilol  (COREG ) 6.25 MG tablet Take 1 tablet (6.25 mg total) by mouth 2 (two) times daily with a meal. TAKE 1 TABLET(3.125 MG) BY MOUTH TWICE DAILY WITH A MEAL 180 tablet 3   clopidogrel  (PLAVIX ) 75 MG tablet Take 1 tablet (75 mg total) by mouth daily. 90 tablet 3   estradiol (ESTRACE) 0.01 % CREA vaginal cream Place 1 Applicatorful vaginally at bedtime.     estrogens , conjugated, (PREMARIN ) 1.25 MG tablet Take 1.25 mg by mouth daily.     feeding supplement (ENSURE ENLIVE / ENSURE PLUS) LIQD Take 237 mLs by mouth 2 (two) times daily between meals. (Patient taking differently: Take 237 mLs by mouth 2 (two) times daily between meals. Pt needs to get more, but she is taking) 237 mL 0   furosemide  (LASIX ) 40 MG tablet Take 0.5 tablets (20 mg total) by mouth daily. TAKE ONE-HALF (1/2) TABLET (20 MG TOTAL) DAILY 45 tablet 1    hydrOXYzine  (ATARAX ) 25 MG tablet Take 0.5-1 tablets (12.5-25 mg total) by mouth 3 (three) times daily as needed for itching. 30 tablet 0   lamoTRIgine  (LAMICTAL ) 100 MG tablet Take 1 tablet (100 mg total) by mouth 2 (two) times daily. 180 tablet 3   potassium chloride  SA (KLOR-CON  M) 20 MEQ tablet TAKE 1 TABLET DAILY (KEEP UPCOMING APPOINTMENT FOR FUTURE REFILLS) 90 tablet 1   progesterone  (PROMETRIUM ) 200 MG capsule Take 200 mg by mouth at bedtime.     SYNTHROID  25 MCG tablet TAKE 1 TABLET DAILY BEFORE BREAKFAST 90 tablet 3   No current facility-administered medications for this visit.    REVIEW OF SYSTEMS  All other systems were reviewed and are negative  Objective:  Objective   There were no vitals filed for this visit.   There is no height or weight on file to calculate BMI.  Physical Exam General: no acute distress Cardiac: hemodynamically stable Pulm: normal work of breathing Extremities: Purplish discoloration of left foot, no wounds or edema. Vascular: Palpable DP on right, nonpalpable pedal's on the left  Data: ABI +---------+------------------+-----+-----------+----------------------+  Right   Rt Pressure (mmHg)IndexWaveform   Comment                 +---------+------------------+-----+-----------+----------------------+  Brachial 160                                                       +---------+------------------+-----+-----------+----------------------+  PTA     178               1.11 monophasic brisk                   +---------+------------------+-----+-----------+----------------------+  DP      185               1.16 multiphasicbiphasic to monophasic  +---------+------------------+-----+-----------+----------------------+  Great Toe63                0.39 Abnormal                           +---------+------------------+-----+-----------+----------------------+   +---------+------------------+-----+----------+-------+   Left    Lt Pressure (mmHg)IndexWaveform  Comment  +---------+------------------+-----+----------+-------+  Brachial 152                                       +---------+------------------+-----+----------+-------+  PTA     101               0.63 monophasic         +---------+------------------+-----+----------+-------+  DP      0                 0.00 absent             +---------+------------------+-----+----------+-------+  Great Toe0                 0.00 Absent             +---------+------------------+-----+----------+-------+   Previously right 1.0 toe pressure 49, left 0.46 toe pressure 0    Mesenteric duplex +-------------+--------+--------+  SMA Stent    PSV cm/sEDV cm/s  +-------------+--------+--------+  Prox to stent  104      27     +-------------+--------+--------+  Origin        148      34     +-------------+--------+--------+  Mid           150      34     +-------------+--------+--------+  Distal        300      59     +-------------+--------+--------+  Dx to stent    369      37     +-------------+--------+--------+  Mesenteric:  Normal Inferior Mesenteric artery and Hepatic artery findings. 70 to 99%  stenosis in the celiac artery.  Patent SMA stent with elevated velocities in the distal and post stent  segments  suggesting  70-99% stenosis. Mildly elevated velocities and turbulent flow  noted  in the ostial splenic artery possibly due to moderate celiac artery  stenosis.       Assessment/Plan:   SHARESA KEMP is a 69 y.o. female with PAD and mesenteric stenosis who underwent SMA intravascular lithotripsy and stenting on 07/14/2023.  She continues to be asymptomatic from a mesenteric standpoint and can eat without any pain or discomfort. From a PAD standpoint she denies rest pain and does not have any wounds and I explained again that we would continue with conservative management with aspirin ,  Plavix  and Lipitor  but if she develops a wound or significant foot pain I explained we would need to proceed with femorofemoral bypass.  In regards to her SMA stent.  The distal stent edge velocity is stable from last year although there is an elevation in the distal portion of the stent.  She continues to be asymptomatic and is tolerating a regular diet without pain.  Will plan to follow-up with a repeat duplex in 3 months.  I strongly encouraged smoking cessation.  Follow-up in 3 months with a repeat ABI and mesenteric duplex  Norman GORMAN Serve MD [facility]  "

## 2024-08-17 ENCOUNTER — Ambulatory Visit (HOSPITAL_COMMUNITY)
Admission: RE | Admit: 2024-08-17 | Discharge: 2024-08-17 | Disposition: A | Discharge status: Home / Self Care | Source: Ambulatory Visit | Attending: Surgery | Admitting: Surgery

## 2024-08-17 ENCOUNTER — Encounter: Payer: Self-pay | Admitting: Vascular Surgery

## 2024-08-17 ENCOUNTER — Ambulatory Visit (HOSPITAL_BASED_OUTPATIENT_CLINIC_OR_DEPARTMENT_OTHER)
Admission: RE | Admit: 2024-08-17 | Discharge: 2024-08-17 | Disposition: A | Discharge status: Home / Self Care | Source: Ambulatory Visit | Attending: Surgery | Admitting: Surgery

## 2024-08-17 ENCOUNTER — Ambulatory Visit (INDEPENDENT_AMBULATORY_CARE_PROVIDER_SITE_OTHER): Admitting: Vascular Surgery

## 2024-08-17 VITALS — BP 138/74 | HR 60 | Temp 98.2°F | Resp 18 | Ht 61.5 in | Wt 84.4 lb

## 2024-08-17 DIAGNOSIS — K551 Chronic vascular disorders of intestine: Secondary | ICD-10-CM | POA: Diagnosis present

## 2024-08-17 DIAGNOSIS — I739 Peripheral vascular disease, unspecified: Secondary | ICD-10-CM

## 2024-08-17 LAB — VAS US ABI WITH/WO TBI
Left ABI: 0.63
Right ABI: 1.16

## 2024-08-21 ENCOUNTER — Other Ambulatory Visit: Payer: Self-pay | Admitting: Cardiovascular Disease

## 2024-08-22 ENCOUNTER — Other Ambulatory Visit: Payer: Self-pay

## 2024-08-22 DIAGNOSIS — K551 Chronic vascular disorders of intestine: Secondary | ICD-10-CM

## 2024-08-22 DIAGNOSIS — I739 Peripheral vascular disease, unspecified: Secondary | ICD-10-CM

## 2024-09-10 ENCOUNTER — Ambulatory Visit

## 2024-09-13 ENCOUNTER — Ambulatory Visit: Admitting: Student in an Organized Health Care Education/Training Program

## 2024-09-17 ENCOUNTER — Ambulatory Visit: Admitting: Student

## 2024-10-04 ENCOUNTER — Ambulatory Visit

## 2024-10-17 ENCOUNTER — Ambulatory Visit: Admitting: Cardiovascular Disease

## 2024-11-16 ENCOUNTER — Ambulatory Visit (HOSPITAL_COMMUNITY)

## 2024-11-16 ENCOUNTER — Ambulatory Visit: Admitting: Vascular Surgery

## 2024-11-23 ENCOUNTER — Ambulatory Visit: Admitting: Vascular Surgery

## 2024-11-23 ENCOUNTER — Encounter (HOSPITAL_COMMUNITY)

## 2025-01-03 ENCOUNTER — Ambulatory Visit

## 2025-01-31 ENCOUNTER — Other Ambulatory Visit

## 2025-02-07 ENCOUNTER — Ambulatory Visit

## 2025-04-04 ENCOUNTER — Ambulatory Visit

## 2025-04-25 ENCOUNTER — Ambulatory Visit: Admitting: Neurology

## 2025-05-16 ENCOUNTER — Ambulatory Visit

## 2025-05-20 ENCOUNTER — Ambulatory Visit (HOSPITAL_COMMUNITY)

## 2025-05-20 ENCOUNTER — Ambulatory Visit: Admitting: Physician Assistant

## 2025-07-04 ENCOUNTER — Ambulatory Visit

## 2025-10-03 ENCOUNTER — Ambulatory Visit
